# Patient Record
Sex: Female | Born: 1935
Health system: Southern US, Community
[De-identification: ages and names within clinical notes are randomized; demographics above are authoritative.]

## PROBLEM LIST (undated history)

## (undated) DIAGNOSIS — I5032 Chronic diastolic (congestive) heart failure: Secondary | ICD-10-CM

## (undated) DIAGNOSIS — N200 Calculus of kidney: Secondary | ICD-10-CM

## (undated) DIAGNOSIS — F419 Anxiety disorder, unspecified: Secondary | ICD-10-CM

## (undated) DIAGNOSIS — M1990 Unspecified osteoarthritis, unspecified site: Secondary | ICD-10-CM

## (undated) DIAGNOSIS — I119 Hypertensive heart disease without heart failure: Secondary | ICD-10-CM

## (undated) DIAGNOSIS — I10 Essential (primary) hypertension: Secondary | ICD-10-CM

## (undated) DIAGNOSIS — I5189 Other ill-defined heart diseases: Secondary | ICD-10-CM

## (undated) DIAGNOSIS — E039 Hypothyroidism, unspecified: Secondary | ICD-10-CM

## (undated) DIAGNOSIS — E785 Hyperlipidemia, unspecified: Secondary | ICD-10-CM

## (undated) DIAGNOSIS — R001 Bradycardia, unspecified: Secondary | ICD-10-CM

## (undated) DIAGNOSIS — R000 Tachycardia, unspecified: Secondary | ICD-10-CM

## (undated) DIAGNOSIS — I493 Ventricular premature depolarization: Secondary | ICD-10-CM

## (undated) DIAGNOSIS — I350 Nonrheumatic aortic (valve) stenosis: Secondary | ICD-10-CM

## (undated) DIAGNOSIS — I340 Nonrheumatic mitral (valve) insufficiency: Secondary | ICD-10-CM

## (undated) HISTORY — DX: Hyperlipidemia, unspecified: E78.5

## (undated) HISTORY — PX: EYE SURGERY: SHX253

## (undated) HISTORY — PX: EXTRACORPOREAL SHOCK WAVE LITHOTRIPSY: SHX1557

## (undated) HISTORY — DX: Unspecified osteoarthritis, unspecified site: M19.90

## (undated) HISTORY — PX: TONSILLECTOMY: SHX5217

## (undated) HISTORY — DX: Ventricular premature depolarization: I49.3

## (undated) HISTORY — DX: Chronic diastolic (congestive) heart failure: I50.32

## (undated) HISTORY — DX: Tachycardia, unspecified: R00.0

## (undated) HISTORY — DX: Hypothyroidism, unspecified: E03.9

## (undated) HISTORY — DX: Essential (primary) hypertension: I10

## (undated) HISTORY — DX: Other ill-defined heart diseases: I51.89

## (undated) HISTORY — PX: CHOLECYSTECTOMY: SHX55

## (undated) HISTORY — PX: TOTAL ABDOMINAL HYSTERECTOMY: SHX209

## (undated) HISTORY — DX: Bradycardia, unspecified: R00.1

## (undated) HISTORY — DX: Hypertensive heart disease without heart failure: I11.9

## (undated) HISTORY — PX: APPENDECTOMY: SHX54

## (undated) HISTORY — DX: Nonrheumatic mitral (valve) insufficiency: I34.0

---

## 1997-06-23 ENCOUNTER — Other Ambulatory Visit: Admission: RE | Admit: 1997-06-23 | Discharge: 1997-06-23 | Payer: Self-pay | Admitting: *Deleted

## 1998-09-19 ENCOUNTER — Other Ambulatory Visit: Admission: RE | Admit: 1998-09-19 | Discharge: 1998-09-19 | Payer: Self-pay | Admitting: *Deleted

## 1999-10-14 ENCOUNTER — Other Ambulatory Visit: Admission: RE | Admit: 1999-10-14 | Discharge: 1999-10-14 | Payer: Self-pay | Admitting: *Deleted

## 2000-05-13 ENCOUNTER — Encounter: Payer: Self-pay | Admitting: General Surgery

## 2000-05-14 ENCOUNTER — Observation Stay (HOSPITAL_COMMUNITY): Admission: RE | Admit: 2000-05-14 | Discharge: 2000-05-15 | Payer: Self-pay | Admitting: General Surgery

## 2000-05-14 ENCOUNTER — Encounter (INDEPENDENT_AMBULATORY_CARE_PROVIDER_SITE_OTHER): Payer: Self-pay | Admitting: Specialist

## 2000-06-23 ENCOUNTER — Encounter: Payer: Self-pay | Admitting: Emergency Medicine

## 2000-06-23 ENCOUNTER — Emergency Department (HOSPITAL_COMMUNITY): Admission: EM | Admit: 2000-06-23 | Discharge: 2000-06-23 | Payer: Self-pay | Admitting: Emergency Medicine

## 2000-09-10 ENCOUNTER — Inpatient Hospital Stay (HOSPITAL_COMMUNITY): Admission: AD | Admit: 2000-09-10 | Discharge: 2000-09-11 | Payer: Self-pay | Admitting: Internal Medicine

## 2000-09-10 ENCOUNTER — Encounter: Payer: Self-pay | Admitting: Internal Medicine

## 2000-09-11 ENCOUNTER — Encounter: Payer: Self-pay | Admitting: Internal Medicine

## 2001-12-13 ENCOUNTER — Other Ambulatory Visit: Admission: RE | Admit: 2001-12-13 | Discharge: 2001-12-13 | Payer: Self-pay | Admitting: Obstetrics and Gynecology

## 2002-02-04 ENCOUNTER — Encounter: Payer: Self-pay | Admitting: Ophthalmology

## 2002-02-08 ENCOUNTER — Ambulatory Visit (HOSPITAL_COMMUNITY): Admission: RE | Admit: 2002-02-08 | Discharge: 2002-02-08 | Payer: Self-pay | Admitting: Ophthalmology

## 2002-06-01 ENCOUNTER — Emergency Department (HOSPITAL_COMMUNITY): Admission: EM | Admit: 2002-06-01 | Discharge: 2002-06-01 | Payer: Self-pay | Admitting: Emergency Medicine

## 2004-01-31 ENCOUNTER — Ambulatory Visit (HOSPITAL_COMMUNITY): Admission: RE | Admit: 2004-01-31 | Discharge: 2004-01-31 | Payer: Self-pay | Admitting: *Deleted

## 2007-10-18 ENCOUNTER — Ambulatory Visit (HOSPITAL_COMMUNITY): Admission: RE | Admit: 2007-10-18 | Discharge: 2007-10-18 | Payer: Self-pay | Admitting: Urology

## 2008-05-17 ENCOUNTER — Encounter: Admission: RE | Admit: 2008-05-17 | Discharge: 2008-08-15 | Payer: Self-pay | Admitting: Family Medicine

## 2009-06-05 ENCOUNTER — Ambulatory Visit: Payer: Self-pay | Admitting: Internal Medicine

## 2009-06-05 DIAGNOSIS — R0609 Other forms of dyspnea: Secondary | ICD-10-CM

## 2009-06-05 DIAGNOSIS — R0989 Other specified symptoms and signs involving the circulatory and respiratory systems: Secondary | ICD-10-CM

## 2009-06-06 ENCOUNTER — Ambulatory Visit: Payer: Self-pay | Admitting: Internal Medicine

## 2009-06-08 ENCOUNTER — Telehealth (INDEPENDENT_AMBULATORY_CARE_PROVIDER_SITE_OTHER): Payer: Self-pay | Admitting: *Deleted

## 2009-06-14 ENCOUNTER — Telehealth (INDEPENDENT_AMBULATORY_CARE_PROVIDER_SITE_OTHER): Payer: Self-pay | Admitting: *Deleted

## 2009-06-18 ENCOUNTER — Ambulatory Visit (HOSPITAL_COMMUNITY): Admission: RE | Admit: 2009-06-18 | Discharge: 2009-06-18 | Payer: Self-pay | Admitting: Internal Medicine

## 2009-06-20 ENCOUNTER — Telehealth (INDEPENDENT_AMBULATORY_CARE_PROVIDER_SITE_OTHER): Payer: Self-pay | Admitting: *Deleted

## 2009-07-09 ENCOUNTER — Ambulatory Visit: Payer: Self-pay | Admitting: Internal Medicine

## 2009-09-03 ENCOUNTER — Ambulatory Visit: Payer: Self-pay | Admitting: Cardiovascular Disease

## 2009-10-01 ENCOUNTER — Ambulatory Visit: Payer: Self-pay | Admitting: Cardiovascular Disease

## 2009-10-01 ENCOUNTER — Inpatient Hospital Stay (HOSPITAL_COMMUNITY): Admission: EM | Admit: 2009-10-01 | Discharge: 2009-10-03 | Payer: Self-pay | Admitting: Emergency Medicine

## 2009-10-02 ENCOUNTER — Encounter (INDEPENDENT_AMBULATORY_CARE_PROVIDER_SITE_OTHER): Payer: Self-pay | Admitting: Internal Medicine

## 2009-10-19 ENCOUNTER — Ambulatory Visit: Payer: Self-pay | Admitting: Cardiovascular Disease

## 2009-11-07 ENCOUNTER — Ambulatory Visit: Payer: Self-pay | Admitting: Internal Medicine

## 2009-11-12 DIAGNOSIS — R930 Abnormal findings on diagnostic imaging of skull and head, not elsewhere classified: Secondary | ICD-10-CM

## 2009-11-18 ENCOUNTER — Inpatient Hospital Stay (HOSPITAL_COMMUNITY): Admission: EM | Admit: 2009-11-18 | Discharge: 2009-11-21 | Payer: Self-pay | Admitting: Emergency Medicine

## 2009-11-28 ENCOUNTER — Telehealth: Payer: Self-pay | Admitting: Internal Medicine

## 2009-12-05 ENCOUNTER — Ambulatory Visit: Payer: Self-pay | Admitting: Cardiovascular Disease

## 2010-01-02 ENCOUNTER — Ambulatory Visit: Payer: Self-pay | Admitting: Cardiovascular Disease

## 2010-02-14 NOTE — Miscellaneous (Signed)
Summary: Orders Update pft charges  Clinical Lists Changes  Orders: Added new Service order of Carbon Monoxide diffusing w/capacity (94720) - Signed Added new Service order of Lung Volumes (94240) - Signed Added new Service order of Spirometry (Pre & Post) (94060) - Signed 

## 2010-02-14 NOTE — Progress Notes (Signed)
Summary: F/u abnl D-dimer, order CT  Phone Note Outgoing Call   Summary of Call: I called patient to confirm persistent dyspnea of relatively recent onset. She indicates no change- no worse but not better. D-dimer modestly elevated. She agreed to look for PE by getting contrast CT. Marland KitchenShe can't do it before next week and asks we call then to schedule because of poor phone reception where she is now. Home # ofr Cell [phone]. Initial call taken by: Waymon Budge MD,  Jun 08, 2009 1:34 PM  Follow-up for Phone Call        this has been scheduled for 06/13/09 pt will be called on tues 06/12/09 and given this date  Follow-up by: Oneita Jolly,  Jun 08, 2009 5:05 PM

## 2010-02-14 NOTE — Assessment & Plan Note (Signed)
Summary: SIX MIN WALK- PULM STRESS TEST  Nurse Visit   Vital Signs:  Patient profile:   77 year old female Pulse rate:   87 / minute BP sitting:   116 / 68  Medications Prior to Update: 1)  Boniva 150 Mg Tabs (Ibandronate Sodium) .... Once Daily 2)  Diltiazem Hcl Er Beads 300 Mg Xr24h-Cap (Diltiazem Hcl Er Beads) .... Once Daily 3)  Simvastatin 40 Mg Tabs (Simvastatin) .... Once Daily 4)  Guanfacine Hcl 2 Mg Tabs (Guanfacine Hcl) .... Once Daily 5)  Lexapro 20 Mg Tabs (Escitalopram Oxalate) .... Once Daily 6)  Vitamin D 1000 Unit Tabs (Cholecalciferol) .... Once Daily 7)  Aspir-Low 81 Mg Tbec (Aspirin) .... Once Daily 8)  Senior Vites  Cr-Tabs (Multiple Vitamins-Minerals) .... Once Daily 9)  Tylenol 325 Mg Tabs (Acetaminophen) .... Once Weekly  Allergies: No Known Drug Allergies  Orders Added: 1)  Pulmonary Stress (6 min walk) [94620]   Six Minute Walk Test Medications taken before test(dose and time): 1)  Boniva 150 Mg Tabs (Ibandronate Sodium) .... Once Daily 2)  Diltiazem Hcl Er Beads 300 Mg Xr24h-Cap (Diltiazem Hcl Er Beads) .... Once Daily 5)  Lexapro 20 Mg Tabs (Escitalopram Oxalate) .... Once Daily 6)  Vitamin D 1000 Unit Tabs (Cholecalciferol) .... Once Daily 7)  Aspir-Low 81 Mg Tbec (Aspirin) .... Once Daily 8)  Senior Vites  Cr-Tabs (Multiple Vitamins-Minerals) .... Once Daily 9)  Tylenol 325 Mg Tabs (Acetaminophen) .... Once Weekly Pt. TOOK THESE MEDS AT 8:30 AM TODAY Supplemental oxygen during the test: No  Lap counter(place a tick mark inside a square for each lap completed) lap 1 complete  lap 2 complete   lap 3 complete   lap 4 complete  lap 5 complete  lap 6 complete  lap 7 complete    Baseline  BP sitting: 116/ 68 Heart rate: 87 Dyspnea ( Borg scale) 3 Fatigue (Borg scale) 3 SPO2 96  End Of Test  BP sitting: 120/ 72 Heart rate: 103 Dyspnea ( Borg scale) 4 Fatigue (Borg scale) 5 SPO2 96  2 Minutes post  BP sitting: 118/ 68 Heart rate:  84 SPO2 98  Stopped or paused before six minutes? No  Interpretation: Number of laps  7 X 48 meters =   336 meters+ final partial lap: 27 meters =    363 meters   Total distance walked in six minutes: 363 meters  Tech ID: Tivis Ringer, CNA (Jun 06, 2009 2:20 PM) Tech Comments Pt. completed test w/ 0 rest breaks and 0 complaints.

## 2010-02-14 NOTE — Progress Notes (Signed)
Summary: results   Phone Note Call from Patient Call back at Home Phone [phone]   Caller: Patient Call For: young Reason for Call: Talk to Nurse Summary of Call: returning call to [facility] re: ct results. Initial call taken by: Eugene Gavia,  June 20, 2009 8:55 AM  Follow-up for Phone Call        Spoke with pt; aware of results of CT; states that she is still having SOB with activity and "gives out easy". Pt aware that I am forwarding this note to CDY for review and will call her with any changes to his rec's. Pt also aware that CDY is not in the office today.Reynaldo Minium CMA  June 20, 2009 9:16 AM   Additional Follow-up for Phone Call Additional follow up Details #1::        I don't see something to treat differently. The hot weather may be part of her problem. I will review at next appointment, or see her back earlier if needed. Additional Follow-up by: Waymon Budge MD,  June 21, 2009 8:57 AM    Additional Follow-up for Phone Call Additional follow up Details #2::    LMTCB   Philipp Deputy [facility]  June 21, 2009 12:15 PM   Spoke with pt.  Pt informed of above recs per CY.  She verbalized understanding and will call back if need to schedule ov sooner with CY.  Gweneth Dimitri RN  June 22, 2009 10:00 AM

## 2010-02-14 NOTE — Assessment & Plan Note (Signed)
Summary: rov ///kp   Primary Herbert Aguinaldo/Referring Kennedy Bohanon:  Camillo Flaming  CC:  follow up visit-Dyspnea with activity-not getting any better.[name]  History of Present Illness: July 09, 2009 Dyspnea..........................Marland Kitchenhusband here Nothing has changed. No cough, no variation from her initial description. Labs: BNP- 73.5, CBC -Hgb 12.7, D-dimer- 0.66. CT- patchy ground glass. She denies obvious cold or viral infection in 2-3 years. PE excluded. PFT- mild obstruction in small airways w/ response to dilator. DLCO mildly reduced. 6 MWT- 96%, 96%, 98%, 363 meters.  Nov 09, 2009-  Dyspnea. Nurse-CC: follow up visit-Dyspnea with activity-not getting any better. Put on Bystolic for BP in September- hosp then for BP at [facility].  She still notes the same exertional dyspnea especially with dressing and upper body exertion.  Denies cough or wheeze, chest pain, palpitation. a little ankle swelling occasionaly. Had flu vax. Prednisone trial last time gave an energy burst but didn't help her breathing otherwise.    Preventive Screening-Counseling & Management  Alcohol-Tobacco     Smoking Status: never  Current Medications (verified): 1)  Diltiazem Hcl Er Beads 360 Mg Xr24h-Cap (Diltiazem Hcl Er Beads) .... Take 1 By Mouth Once Daily 2)  Lexapro 20 Mg Tabs (Escitalopram Oxalate) .... Once Daily 3)  Vitamin D 1000 Unit Tabs (Cholecalciferol) .... Once Daily 4)  Aspir-Low 81 Mg Tbec (Aspirin) .... Once Daily 5)  Senior Vites  Cr-Tabs (Multiple Vitamins-Minerals) .... Once Daily 6)  Bystolic 20 Mg Tabs (Nebivolol Hcl) .... Take 1 By Mouth Once Daily 7)  Calcium 500 Mg Chew (Calcium Carbonate) .... Take 1000mg  Daily 8)  Levothyroxine Sodium 25 Mcg Tabs (Levothyroxine Sodium) .... Take 1 By Mouth Once Daily 9)  Pravastatin Sodium 20 Mg Tabs (Pravastatin Sodium) .... Take 1 By Mouth Once Daily 10)  Norvasc 2.5 Mg Tabs (Amlodipine Besylate) .... 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   Review of Systems      See HPI       The patient complains of shortness of breath with activity.  The patient denies shortness of breath at rest, productive cough, non-productive cough, coughing up blood, chest pain, irregular heartbeats, acid heartburn, indigestion, loss of appetite, weight change, abdominal pain, difficulty swallowing, sore throat, tooth/dental problems, headaches, nasal congestion/difficulty breathing through nose, sneezing, itching, ear ache, rash, change in color of mucus, and fever.    Vital Signs:  Patient profile:   77 year old female Height:      64 inches Weight:      155 pounds BMI:     26.70 O2 Sat:      97 % on Room air Pulse rate:   70 /  minute BP sitting:   112 / 56  (left arm) Cuff size:   regular  Vitals Entered By: Reynaldo Minium CMA (November 07, 2009 10:14 AM)  O2 Flow:  Room air CC: follow up visit-Dyspnea with activity-not getting any better.   Physical Exam  Additional Exam:  General:  A/Ox3; pleasant and cooperative, NAD, medium build SKIN: no rash, lesions NODES: no lymphadenopathy HEENT: [place]/AT, EOM- WNL, hard of hearing, Conjuctivae- clear, PERRLA, TM-WNL, Nose- clear, Throat- clear and wnl, Mallampati  II. NECK: Supple w/ fair ROM, JVD-trace, normal carotid impulses w/o bruits Thyroid- normal to palpation, no stridor CHEST: Clear to P&A, normal expansion and airflow HEART: RRR, no m/g/r heard ABDOMEN: Soft and nl; nml bowel sounds; no organomegaly or masses noted NAT:FTDD, nl pulses, trace nonpitting edema, no cyanosis or clubbing. Good capillary fill. [name]  NEURO: Grossly intact to observation      Impression & Recommendations:  Problem # 1:  DYSPNEA ON EXERTION (ICD-786.09)  PFTs and chest exam don't suggest pulmonary  basis for dyspnea. I suspect this may be from her cardiac meds. Because of her mild, small airway reversibility I will let her try a rescue inhaler for occasional use with training.  Her updated medication list for this problem includes:    Bystolic 20 Mg Tabs (Nebivolol hcl) .[name]... Take 1 by mouth once daily    Xopenex Hfa 45 Mcg/act Aero (Levalbuterol tartrate) .[name]... 2 puffs four times a day as needed rescue inhaler  Problem # 2:  CT, CHEST, ABNORMAL (ICD-793.1) Her initial CT chest had shown grownd glass opacities indicating fluid in lung parenchyma, such as edema or inflammation. Exam doesn't show much now to go along with this, other than her exertional dyspnea. At age 60 we are watching conservatively.   Medications Added to Medication List This Visit: 1)  Diltiazem Hcl Er Beads 360 Mg Xr24h-cap (Diltiazem hcl er beads) .... Take 1 by mouth once daily 2)  Norvasc 2.5 Mg Tabs (Amlodipine besylate) .... Take 1 by mouth once daily 3)  Bystolic 20 Mg Tabs (Nebivolol hcl) .... Take 1 by mouth once daily 4)  Calcium 500 Mg Chew (Calcium carbonate) .... Take 1000mg  daily 5)  Levothyroxine Sodium 25 Mcg Tabs (Levothyroxine sodium) .... Take 1 by mouth  once daily 6)  Pravastatin Sodium 20 Mg Tabs (Pravastatin sodium) .... Take 1 by mouth once daily 7)  Xopenex Hfa 45 Mcg/act Aero (Levalbuterol tartrate) .... 2 puffs four times a day as needed rescue inhaler  Other Orders: Est. Patient Level IV (22025)  Patient Instructions: 1)  Please schedule a follow-up appointment as needed. 2)  Try sample Xopenex rescue inhaler 3)   2 puffs, four times a day as needed  4)  CC Dr Zachery Dauer, Dr Elease Hashimoto Prescriptions: Pauline Aus HFA 45 MCG/ACT AERO (LEVALBUTEROL TARTRATE) 2 puffs four times a day as needed rescue inhaler  #1 x 0   Entered and Authorized by:   Waymon Budge MD   Signed by:   Waymon Budge MD on 11/07/2009   Method used:   Samples Given   RxID:   [id number]

## 2010-02-14 NOTE — Progress Notes (Signed)
Summary: sched CT  Phone Note Call from Patient Call back at Home Phone [phone]   Caller: Patient Call For: young Summary of Call: pt requests to speak to libby re: scheduling a CT Initial call taken by: Tivis Ringer, CNA,  June 14, 2009 8:45 AM  Follow-up for Phone Call        pt had to have chest ct [facility] 06/18/09@4 :30pm due to having a hard time geting iv in pt is aware  Follow-up by: Oneita Jolly,  June 14, 2009 9:12 AM

## 2010-02-14 NOTE — Assessment & Plan Note (Signed)
Summary: dyspnea/ok per Katie/apc   Primary Provider/Referring Provider:  Camillo Flaming  CC:  Dysnpea/Consult.  History of Present Illness: Jun 05, 2009- 77 yoF never-smoker, coming with her husband today on kind referral by Dr Camillo Flaming for concern of shortnes of breath. she denies prior cardiopulmonary problems. First began to notice dyspnea sometime after move to [facility] 2 years ago with no sudden event or trigger. Especialy in the past 2-3 months she is having to stop, sit and pace herself to take care of the house and ADL's. She is geting gradually worse with exertion, but comfortable sitting and lying at rest. There is little change from day to day. Denies chest pain, palpitation, cough, wheeze, bleeding, bruising, nodes or fever. Denies hx of anemia. Weight may be down 5 lbs. Has coughed "tiny phlegms" just in past 2 weeks. Ankles sometimes swell a litte by end of day, but she denies leg pain, cramps, tightness, personal or family hx of VTE. When she was a child, grandmother with TB (status unknown) lived with her. Never had PPD. Dr Elease Hashimoto recently saw her for cardiology with echo and suggested pulmonary referral.  Current Medications (verified): 1)  Boniva 150 Mg Tabs (Ibandronate Sodium) .... Once Daily 2)  Diltiazem Hcl Er Beads 300 Mg Xr24h-Cap (Diltiazem Hcl Er Beads) .... Once Daily 3)  Simvastatin 40 Mg Tabs (Simvastatin) .... Once Daily 4)  Guanfacine Hcl 2 Mg Tabs (Guanfacine Hcl) .... Once Daily 5)  Lexapro 20 Mg Tabs (Escitalopram Oxalate) .... Once Daily 6)  Vitamin D 1000 Unit Tabs (Cholecalciferol) .... Once Daily 7)  Aspir-Low 81 Mg Tbec (Aspirin) .... Once Daily 8)  Senior Vites  Cr-Tabs (Multiple Vitamins-Minerals) .... Once Daily 9)  Tylenol 325 Mg Tabs (Acetaminophen) .... Once Weekly  Allergies (verified): No Known Drug Allergies  Past History:  Past Medical History: Dyspnea with exertion 2011 HTN, HYPERLIPERDEMIA Kidney stone  Past Surgical  History: GALLBLADDER 2001 HYSTERECTOMY 1955 Tonsillectomy  Family History: NO PAST FAMILY MEDICAL HX  Social History: LIVES WITH HUSBAND AND IS RETIRED  Review of Systems      See HPI       The patient complains of shortness of breath with activity and coughing up blood.  The patient denies shortness of breath at rest, productive cough, non-productive cough, chest pain, irregular heartbeats, acid heartburn, indigestion, loss of appetite, weight change, abdominal pain, difficulty swallowing, sore throat, tooth/dental problems, headaches, nasal congestion/difficulty breathing through nose, sneezing, itching, ear ache, anxiety, depression, hand/feet swelling, joint stiffness or pain, rash, change in color of mucus, and fever.    Vital Signs:  Patient profile:   77 year old female Height:      64 inches Weight:      153.0 pounds BMI:     26.36 O2 Sat:      93 % on Room air Pulse rate:   74 / minute BP sitting:   116 / 68  (left arm) Cuff size:   regular  Vitals Entered By: Denna Haggard, CMA (Jun 05, 2009 11:10 AM)  O2 Sat at Rest %:  93% O2 Flow:  Room air  Physical Exam  Additional Exam:  General: A/Ox3; pleasant and cooperative, NAD, medium build SKIN: no rash, lesions NODES: no lymphadenopathy HEENT: [place]/AT, EOM- WNL, hard of hearing, Conjuctivae- clear, PERRLA, TM-WNL, Nose- clear, Throat- clear and wnl, M II NECK: Supple w/ fair ROM, JVD- none, normal carotid impulses w/o bruits Thyroid- normal to palpation, no stridor CHEST: Clear to P&A, normal  expansion and airflow HEART: RRR, no m/g/r heard ABDOMEN: Soft and nl; nml bowel sounds; no organomegaly or masses noted JXB:JYNW, nl pulses, trace nonpitting edema, no cyanosis or clubbing. Good capillary fill. Negative Homan's.  NEURO: Grossly intact to observation      Impression & Recommendations:  Problem # 1:  DYSPNEA ON EXERTION (ICD-786.09)  Progressive exertional dyspnea only with exertion and without  obvious associated clues to direct attention. She doesn't look anemic. Pulmonary fibrosis, heart failure and silent PE might also present like this. We will check with Dr Zachery Dauer office on result of CBC drawn today. Meanwhile we wil proceed with CXR and D-dimer, and schedule PFT including a . Consider eval for PE. Note that Dr Elease Hashimoto didn't find a cardiac explanation. Nearly an hour was spent working through differential diagnostic possibilities with this nice lady and her husband.  Medications Added to Medication List This Visit: 1)  Boniva 150 Mg Tabs (Ibandronate sodium) .... Once daily 2)  Diltiazem Hcl Er Beads 300 Mg Xr24h-cap (Diltiazem hcl er beads) .... Once daily 3)  Simvastatin 40 Mg Tabs (Simvastatin) .... Once daily 4)  Guanfacine Hcl 2 Mg Tabs (Guanfacine hcl) .... Once daily 5)  Lexapro 20 Mg Tabs (Escitalopram oxalate) .... Once daily 6)  Vitamin D 1000 Unit Tabs (Cholecalciferol) .... Once daily 7)  Aspir-low 81 Mg Tbec (Aspirin) .... Once daily 8)  Senior Vites Cr-tabs (Multiple vitamins-minerals) .... Once daily 9)  Tylenol 325 Mg Tabs (Acetaminophen) .... Once weekly  Other Orders: Consultation Level IV (29562) T-2 View CXR (71020TC) TLB-BNP (B-Natriuretic Peptide) (83880-BNPR) T-D-Dimer Fibrin Derivatives Quantitive ([id number])  Patient Instructions: 1)  Please schedule a follow-up appointment in 3 weeks. 2)  Schedule PFT and 6 MWT 3)  A chest x-ray has been recommended.  Your imaging study may require preauthorization.  4)  Lab 5)  We will check with Dr Zachery Dauer for result of the labwork she drew today, and we will call you with the reults of our own.

## 2010-02-14 NOTE — Progress Notes (Signed)
Summary: inhaler  Phone Note Call from Patient   Caller: Patient Call For: young Summary of Call: dr young told her to call and check to see if we have samples of inhaler.he didn't say what kind. Initial call taken by: Rickard Patience,  November 28, 2009 9:02 AM  Follow-up for Phone Call        Pt states at last OV Dr. Maple Hudson wanted her to take a sample of xopenex to try but he did not have any  that day so told her to call back in a few weeks to check to see if we have any samples. One sample of xopenex hfa left at front for pt. pt aware. Carron Curie CMA  November 28, 2009 9:32 AM

## 2010-02-14 NOTE — Assessment & Plan Note (Signed)
Summary: rov 3 wks///kp   Primary Provider/Referring Provider:  Camillo Flaming  CC:  Follow up visit-still SOB with activity..  History of Present Illness: History of Present Illness: Jun 05, 2009- 88 yoF never-smoker, coming with her husband today on kind referral by Dr Camillo Flaming for concern of shortnes of breath. she denies prior cardiopulmonary problems. First began to notice dyspnea sometime after move to [facility] 2 years ago with no sudden event or trigger. Especialy in the past 2-3 months she is having to stop, sit and pace herself to take care of the house and ADL's. She is geting gradually worse with exertion, but comfortable sitting and lying at rest. There is little change from day to day. Denies chest pain, palpitation, cough, wheeze, bleeding, bruising, nodes or fever. Denies hx of anemia. Weight may be down 5 lbs. Has coughed "tiny phlegms" just in past 2 weeks. Ankles sometimes swell a litte by end of day, but she denies leg pain, cramps, tightness, personal or family hx of VTE. When she was a child, grandmother with TB (status unknown) lived with her. Never had PPD. Dr Elease Hashimoto recently saw her for cardiology with echo and suggested pulmonary referral.  July 09, 2009 Dyspnea..........................Marland Kitchenhusband here Nothing has changed. No cough, no variation from her initial description. Labs: BNP- 73.5, CBC -Hgb 12.7, D-dimer- 0.66. CT- patchy ground glass. She denies obvious cold or viral infection in 2-3 years. PE excluded. PFT- mild obstruction in small airways w/ response to dilator. DLCO mildly reduced. 6 MWT- 96%, 96%, 98%, 363 meters.    Preventive Screening-Counseling & Management  Alcohol-Tobacco     Smoking Status: never  Current Medications (verified): 1)  Diltiazem Hcl Er Beads 300 Mg Xr24h-Cap (Diltiazem Hcl Er Beads) .... Once Daily 2)  Simvastatin 40 Mg Tabs (Simvastatin) .... Once Daily 3)  Guanfacine Hcl 2 Mg Tabs (Guanfacine Hcl) .... Once Daily 4)   Lexapro 20 Mg Tabs (Escitalopram Oxalate) .... Once Daily 5)  Vitamin D 1000 Unit Tabs (Cholecalciferol) .... Once Daily 6)  Aspir-Low 81 Mg Tbec (Aspirin) .... Once Daily 7)  Senior Vites  Cr-Tabs (Multiple Vitamins-Minerals) .... Once Daily 8)  Tylenol 325 Mg Tabs (Acetaminophen) .... Once Weekly  Allergies (verified): No Known Drug Allergies  Past History:  Past Medical History: Last updated: 06/05/2009 Dyspnea with exertion 2011 HTN, HYPERLIPERDEMIA Kidney stone  Past Surgical History: Last updated: 06/05/2009 GALLBLADDER 2001 HYSTERECTOMY 1955 Tonsillectomy  Family History: Last updated: 06/05/2009 NO PAST FAMILY MEDICAL HX  Social History: Last updated: 06/05/2009 LIVES WITH HUSBAND AND IS RETIRED  Risk Factors: Smoking Status: never (07/09/2009)  Social History: Smoking Status:  never  Review of Systems      See HPI       The patient complains of shortness of breath with activity.  The patient denies shortness of breath at rest, productive cough, non-productive cough, coughing up blood, chest pain, irregular heartbeats, acid heartburn, indigestion, loss of appetite, weight change, abdominal pain, difficulty swallowing, sore throat, tooth/dental problems, headaches, nasal congestion/difficulty breathing through nose, sneezing, itching, ear ache, hand/feet swelling, change in color of mucus, and fever.    Vital Signs:  Patient profile:   77 year old female Height:      64 inches Weight:      155 pounds BMI:     26.70 O2 Sat:      97 % on Room air Pulse rate:   95 / minute BP sitting:   130 / 70  (left arm) Cuff size:  regular  Vitals Entered By: Reynaldo Minium CMA (July 09, 2009 11:31 AM)  O2 Flow:  Room air CC: Follow up visit-still SOB with activity.   Physical Exam  Additional Exam:  General: A/Ox3; pleasant and cooperative, NAD, medium build SKIN: no rash, lesions NODES: no lymphadenopathy HEENT: [place]/AT, EOM- WNL, hard of hearing, Conjuctivae-  clear, PERRLA, TM-WNL, Nose- clear, Throat- clear and wnl, Mellampati  II. somehat stridorous vocal drag with inhalation NECK: Supple w/ fair ROM, JVD- none, normal carotid impulses w/o bruits Thyroid- normal to palpation, no stridor CHEST: Clear to P&A, normal expansion and airflow HEART: RRR, no m/g/r heard ABDOMEN: Soft and nl; nml bowel sounds; no organomegaly or masses noted ZOX:WRUE, nl pulses, trace nonpitting edema, no cyanosis or clubbing. Good capillary fill. Negative Homan's.  NEURO: Grossly intact to observation      Impression & Recommendations:  Problem # 1:  DYSPNEA ON EXERTION (ICD-786.09)  CT showed ground glass suggesting a bronchiolitis. PFT showed reversible small airway obstruction with reduced DLCO. was good, Slight drag with inspiration but no stridor. She is especially aware of dyspnea after shower and while waliking hills and stairs. We will try a systemic cortisone burst first.I don't know that objective findings are enough to explain her complaint. Consider deconditioning and query any cardiac limitation. Will share results with her cardiologist.  Medications Added to Medication List This Visit: 1)  Prednisone 10 Mg Tabs (Prednisone) .[name].. 1 tab four times daily x 2 days, 3 times daily x 2 days, 2 times daily x 2 days, 1 time daily x 2 days  Other Orders: Est. Patient Level IV (45409)  Patient Instructions: 1)  Please schedule a follow-up appointment in 4 months. 2)  script sent for prednisone taper 3)  Copy sent to:Dr [name] Prescriptions: PREDNISONE 10 MG TABS (PREDNISONE) 1 tab four times daily x 2 days, 3 times daily x 2 days, 2 times daily x 2 days, 1 time daily x 2 days  #20 x 0   Entered and Authorized by:   Waymon Budge MD   Signed by:   Waymon Budge MD on 07/09/2009   Method used:   Electronically to        [id number] (retail)       [address]       [place], [place]  [address]       Ph: [phone] or [phone]        Fax: [phone]   RxID:   [id number]

## 2010-03-26 LAB — COMPREHENSIVE METABOLIC PANEL
Albumin: 3.5 g/dL (ref 3.5–5.2)
BUN: 14 mg/dL (ref 6–23)
CO2: 25 mEq/L (ref 19–32)
Calcium: 9.2 mg/dL (ref 8.4–10.5)
Chloride: 98 mEq/L (ref 96–112)
Creatinine, Ser: 1.23 mg/dL — ABNORMAL HIGH (ref 0.4–1.2)
GFR calc Af Amer: 50 mL/min — ABNORMAL LOW (ref >60)
GFR calc non Af Amer: 41 mL/min — ABNORMAL LOW (ref >60)
Total Bilirubin: 0.4 mg/dL (ref 0.3–1.2)

## 2010-03-26 LAB — CBC
Hemoglobin: 11.2 g/dL — ABNORMAL LOW (ref 12.0–15.0)
MCH: 31.4 pg (ref 26.0–34.0)
MCH: 31.8 pg (ref 26.0–34.0)
MCHC: 32.6 g/dL (ref 30.0–36.0)
MCV: 96.3 fL (ref 78.0–100.0)
Platelets: 334 10*3/uL (ref 150–400)
RBC: 3.52 MIL/uL — ABNORMAL LOW (ref 3.87–5.11)
RBC: 3.82 MIL/uL — ABNORMAL LOW (ref 3.87–5.11)

## 2010-03-26 LAB — URINE CULTURE
Colony Count: NO GROWTH
Culture  Setup Time: 201111062123
Culture: NO GROWTH

## 2010-03-26 LAB — BASIC METABOLIC PANEL
CO2: 26 mEq/L (ref 19–32)
Calcium: 8.9 mg/dL (ref 8.4–10.5)
GFR calc Af Amer: >60 mL/min (ref >60)
GFR calc non Af Amer: >60 mL/min (ref >60)
Sodium: 143 mEq/L (ref 135–145)

## 2010-03-26 LAB — URINALYSIS, ROUTINE W REFLEX MICROSCOPIC
Ketones, ur: NEGATIVE mg/dL
Nitrite: NEGATIVE
Protein, ur: NEGATIVE mg/dL
Urobilinogen, UA: 0.2 mg/dL (ref 0.0–1.0)

## 2010-03-26 LAB — DIFFERENTIAL
Basophils Absolute: 0 10*3/uL (ref 0.0–0.1)
Eosinophils Absolute: 0.2 10*3/uL (ref 0.0–0.7)
Eosinophils Relative: 3 % (ref 0–5)
Lymphocytes Relative: 42 % (ref 12–46)
Lymphs Abs: 3.4 10*3/uL (ref 0.7–4.0)
Lymphs Abs: 3.7 10*3/uL (ref 0.7–4.0)
Monocytes Absolute: 0.5 10*3/uL (ref 0.1–1.0)
Monocytes Relative: 7 % (ref 3–12)
Neutro Abs: 4.4 10*3/uL (ref 1.7–7.7)

## 2010-03-26 LAB — APTT: aPTT: 26 seconds (ref 24–37)

## 2010-03-26 LAB — PROTIME-INR: Prothrombin Time: 12.4 seconds (ref 11.6–15.2)

## 2010-03-26 LAB — CARDIAC PANEL(CRET KIN+CKTOT+MB+TROPI): Relative Index: INVALID (ref 0.0–2.5)

## 2010-03-26 LAB — URINE MICROSCOPIC-ADD ON

## 2010-03-26 LAB — POCT CARDIAC MARKERS
CKMB, poc: <1 ng/mL — ABNORMAL LOW (ref 1.0–8.0)
Troponin i, poc: <0.05 ng/mL (ref 0.00–0.09)

## 2010-03-26 LAB — TSH: TSH: 6.075 u[IU]/mL — ABNORMAL HIGH (ref 0.350–4.500)

## 2010-03-26 LAB — CK TOTAL AND CKMB (NOT AT ARMC)
CK, MB: 0.6 ng/mL (ref 0.3–4.0)
Relative Index: INVALID (ref 0.0–2.5)

## 2010-03-28 LAB — CBC
MCH: 33 pg (ref 26.0–34.0)
MCV: 95.2 fL (ref 78.0–100.0)
MCV: 95.3 fL (ref 78.0–100.0)
Platelets: 282 10*3/uL (ref 150–400)
Platelets: 310 10*3/uL (ref 150–400)
RBC: 3.91 MIL/uL (ref 3.87–5.11)
RDW: 13.6 % (ref 11.5–15.5)
WBC: 11.6 10*3/uL — ABNORMAL HIGH (ref 4.0–10.5)
WBC: 12 10*3/uL — ABNORMAL HIGH (ref 4.0–10.5)

## 2010-03-28 LAB — BASIC METABOLIC PANEL
BUN: 10 mg/dL (ref 6–23)
CO2: 23 mEq/L (ref 19–32)
CO2: 25 mEq/L (ref 19–32)
Calcium: 9.3 mg/dL (ref 8.4–10.5)
Chloride: 105 mEq/L (ref 96–112)
Chloride: 106 mEq/L (ref 96–112)
Chloride: 107 mEq/L (ref 96–112)
Creatinine, Ser: 0.52 mg/dL (ref 0.4–1.2)
Creatinine, Ser: 0.62 mg/dL (ref 0.4–1.2)
Creatinine, Ser: 0.92 mg/dL (ref 0.4–1.2)
GFR calc Af Amer: >60 mL/min (ref >60)
GFR calc Af Amer: >60 mL/min (ref >60)
Glucose, Bld: 119 mg/dL — ABNORMAL HIGH (ref 70–99)

## 2010-03-28 LAB — TSH: TSH: 3.213 u[IU]/mL (ref 0.350–4.500)

## 2010-03-28 LAB — CARDIAC PANEL(CRET KIN+CKTOT+MB+TROPI)
CK, MB: 1.1 ng/mL (ref 0.3–4.0)
CK, MB: 1.3 ng/mL (ref 0.3–4.0)
Relative Index: INVALID (ref 0.0–2.5)
Relative Index: INVALID (ref 0.0–2.5)
Total CK: 35 U/L (ref 7–177)
Troponin I: 0.01 ng/mL (ref 0.00–0.06)

## 2010-03-28 LAB — T3, FREE: T3, Free: 2.3 pg/mL (ref 2.3–4.2)

## 2010-03-28 LAB — LIPID PANEL
Cholesterol: 179 mg/dL (ref 0–200)
LDL Cholesterol: 109 mg/dL — ABNORMAL HIGH (ref 0–99)
Triglycerides: 63 mg/dL (ref <150)

## 2010-03-28 LAB — MAGNESIUM
Magnesium: 2.1 mg/dL (ref 1.5–2.5)
Magnesium: 2.2 mg/dL (ref 1.5–2.5)

## 2010-03-28 LAB — DIFFERENTIAL
Basophils Absolute: 0 10*3/uL (ref 0.0–0.1)
Eosinophils Absolute: 0 10*3/uL (ref 0.0–0.7)
Eosinophils Relative: 0 % (ref 0–5)
Eosinophils Relative: 1 % (ref 0–5)
Lymphocytes Relative: 20 % (ref 12–46)
Lymphs Abs: 2.6 10*3/uL (ref 0.7–4.0)
Monocytes Relative: 5 % (ref 3–12)
Neutrophils Relative %: 71 % (ref 43–77)
Neutrophils Relative %: 74 % (ref 43–77)

## 2010-03-28 LAB — COMPREHENSIVE METABOLIC PANEL
ALT: 38 U/L — ABNORMAL HIGH (ref 0–35)
AST: 38 U/L — ABNORMAL HIGH (ref 0–37)
Albumin: 3.6 g/dL (ref 3.5–5.2)
Calcium: 8.6 mg/dL (ref 8.4–10.5)
GFR calc Af Amer: >60 mL/min (ref >60)
Sodium: 139 mEq/L (ref 135–145)
Total Protein: 7.7 g/dL (ref 6.0–8.3)

## 2010-03-28 LAB — RAPID URINE DRUG SCREEN, HOSP PERFORMED
Barbiturates: NOT DETECTED
Cocaine: NOT DETECTED

## 2010-03-28 LAB — URINE CULTURE
Colony Count: NO GROWTH
Culture  Setup Time: 201109192150

## 2010-03-28 LAB — URINE MICROSCOPIC-ADD ON

## 2010-03-28 LAB — URINALYSIS, ROUTINE W REFLEX MICROSCOPIC
Bilirubin Urine: NEGATIVE
Nitrite: NEGATIVE
Specific Gravity, Urine: 1.018 (ref 1.005–1.030)
Urobilinogen, UA: 0.2 mg/dL (ref 0.0–1.0)
pH: 6 (ref 5.0–8.0)

## 2010-03-28 LAB — PHOSPHORUS
Phosphorus: 2.9 mg/dL (ref 2.3–4.6)
Phosphorus: 3.1 mg/dL (ref 2.3–4.6)

## 2010-03-28 LAB — HEMOGLOBIN A1C: Mean Plasma Glucose: 126 mg/dL — ABNORMAL HIGH (ref <117)

## 2010-03-28 LAB — POCT CARDIAC MARKERS

## 2010-05-31 NOTE — H&P (Signed)
   NAMETAMANIKA, HEINEY                             ACCOUNT NO.:  [id number]   MEDICAL RECORD NO.:  [id number]                   PATIENT TYPE:  OIB   LOCATION:  2857                                 FACILITY:  [facility]   PHYSICIAN:  Guadelupe Sabin, M.D.             DATE OF BIRTH:  09-16-1921   DATE OF ADMISSION:  02/08/2002  DATE OF DISCHARGE:                                HISTORY & PHYSICAL   CHIEF COMPLAINT:  This was a planned outpatient surgical admission of this  77 year old white female admitted for cataract implant surgery of the right  eye.   HISTORY OF PRESENT ILLNESS:  This patient has been followed in my office for  routine eye care since August 18, 1973.  Initial examination at that time  revealed a corrected acuity of 20/20 in each eye.  Presbyopia was noted, but  the eye itself appeared normal.  Over ensuing years, the patient was seen in  08/01/97 at which time it was noted that the patient had developed cataract  formation and pseudoexfoliation.  Applanation tonometry 14 mm right eye, 16  left eye.  The patient was felt to be a glaucoma suspect in view of the  pseudoexfoliation syndrome.  The patient continued to have a normal  intraocular pressure and progression of her cataracts.  By December 2003,  vision had deteriorated to 20/80 with best correction and it was elected to  proceed with cataract implant surgery.  The patient was given oral  discussion and printed information concerning the procedure and its possible  complications.  She signed an informed consent and arrangements were made  for her outpatient admission at this time.  The patient stated that she had  claustrophobia and requested general anesthesia rather than local  anesthesia.                                               Guadelupe Sabin, M.D.    HNJ/MEDQ  D:  02/08/2002  T:  02/08/2002  Job:  [id number]

## 2010-05-31 NOTE — Op Note (Signed)
NAMELESHAWN, HOUSEWORTH                             ACCOUNT NO.:  [id number]   MEDICAL RECORD NO.:  [id number]                   PATIENT TYPE:  OIB   LOCATION:  2857                                 FACILITY:  [facility]   PHYSICIAN:  Guadelupe Sabin, M.D.             DATE OF BIRTH:  May 30, 1921   DATE OF PROCEDURE:  02/08/2002  DATE OF DISCHARGE:                                 OPERATIVE REPORT   PREOPERATIVE DIAGNOSES:  1. Senile nuclear cataract, right eye.  2. Pseudoexfoliation of lens capsule, right eye.   POSTOPERATIVE DIAGNOSES:  1. Senile nuclear cataract, right eye.  2. Pseudoexfoliation of lens capsule, right eye.   NAME OF OPERATION:  Planned extracapsular cataract extraction -  phacoemulsification, primary insertion approach to chamber intraocular lens  implant.   SURGEON:  Guadelupe Sabin, M.D.   ASSISTANT:  Nurse.   ANESTHESIA:  General, at patient's request due to claustrophobia.  The  patient also received 2 mL of retrobulbar injection consisting of 4%  Xylocaine and 0.75 Marcaine and Wydase.  Anesthesia standby, of course, was  required in the general anesthesia.  Dr. Gypsy Balsam was the attending  anesthesiologist.   OPERATIVE PROCEDURE:  After the patient was prepped and draped, a lid  speculum was inserted in the right eye.  She has tonometry that was recorded  at 8 scale units with a 5.5 g weight.  A superior rectus traction suture was  placed.  A peritomy was performed at the limbus from the 11 and 1 o'clock  position.  The corneoscleral junction was cleaned and corneal scleral groove  made with a 45-degree super blade.  The anterior chamber was then entered  with the 2.5 mm diamond keratome at the 12 o'clock position and the 15  degree blade at the 2:30 position.  Using a bent #26-gauge needle on a  Healon syringe, a circular capsulorrhexis was begun and then completed with  the Grabow forceps.  Hydrodissection and hydrodelineation were performed  using 1%  Xylocaine.  The lens nucleus could be rotated in the capsular bag.  A 30-degree phacoemulsification tip was then inserted with slow controlled  emulsification of the lens nucleus.  Total ultrasonic time 2 minutes 22  seconds.  Average power level 40%.  Total amount of fluid used 100 mL.  Following removal of the nucleus, the residual cortex was aspirated with the  irrigation-aspiration tip.  The posterior capsule appeared intact and there  did not appear to be an zonular dehiscence.  It was therefore elected to  insert and an [employer] SI 40 and these three-piece silicone  posterior chamber intraocular lens implant, diopter strength  +19.00.  This was inserted with the McDonald forceps into the anterior  chamber and then centered into the capsular bag using the [facility] lens  rotator.  The lens appeared to be well centered.  The Healon which had been  used throughout the procedure was aspirated and replaced with balanced salt  solution and Myocol ophthalmic solution.  The operative incisions appeared  to be self sealing, however, it was felt due to the general anesthesia that  one single 10-0 interrupted suture would secure the 12 o'clock incision.  Maxitrol ointment was instilled in the conjunctival cul-  de-sac and a light patch and protective shield applied.  The duration of the  procedure and anesthesia administration was 45 minutes.  The patient  tolerated the procedure well in general and left the operating room for the  recovery room in good condition.                                                    Guadelupe Sabin, M.D.    HNJ/MEDQ  D:  02/08/2002  T:  02/08/2002  Job:  [id number]

## 2010-06-28 ENCOUNTER — Encounter: Payer: Self-pay | Admitting: *Deleted

## 2010-07-05 ENCOUNTER — Ambulatory Visit (INDEPENDENT_AMBULATORY_CARE_PROVIDER_SITE_OTHER): Payer: Medicare Other | Admitting: Cardiovascular Disease

## 2010-07-05 ENCOUNTER — Encounter: Payer: Self-pay | Admitting: Cardiovascular Disease

## 2010-07-05 VITALS — BP 128/68 | HR 78 | Wt 156.0 lb

## 2010-07-05 DIAGNOSIS — I5189 Other ill-defined heart diseases: Secondary | ICD-10-CM

## 2010-07-05 DIAGNOSIS — I519 Heart disease, unspecified: Secondary | ICD-10-CM

## 2010-07-05 DIAGNOSIS — I5032 Chronic diastolic (congestive) heart failure: Secondary | ICD-10-CM | POA: Insufficient documentation

## 2010-07-05 NOTE — Assessment & Plan Note (Signed)
[name] presents with a history of diastolic dysfunction. She is doing very well. She is not having any episodes of shortness breath. We'll continue with her same medications.

## 2010-07-05 NOTE — Progress Notes (Signed)
[name] Date of Birth  25-May-1921 [facility] [address]     [address] [place], [place]  [address] [phone]  Fax  [phone]  History of Present Illness:  Pt is doing well.  No cardiac complaints.  Current Outpatient Prescriptions on File Prior to Visit  Medication Sig Dispense Refill  . Acetaminophen (TYLENOL PO) Take 1-2 capsules by mouth as needed.        [name] amLODipine (NORVASC) 2.5 MG tablet Take 2.5 mg by mouth daily.        [name] aspirin 81 MG tablet Take by mouth. 1/4 tablet daily        . Calcium Carb-Cholecalciferol (CALCIUM 1000 + D PO) Take 1,000 mg by mouth daily.        . celecoxib (CELEBREX) 200 MG capsule Take 200 mg by mouth as needed.        . cholecalciferol (VITAMIN D) 1000 UNITS tablet Take 1,000 Units by mouth daily.        [name] levothyroxine (SYNTHROID, LEVOTHROID) 25 MCG tablet Take 25 mcg by mouth daily.        . multivitamin (THERAGRAN) per tablet Take 1 tablet by mouth daily.        . nebivolol (BYSTOLIC) 5 MG tablet Take 5 mg by mouth daily.        . pravastatin (PRAVACHOL) 20 MG tablet Take 20 mg by mouth daily.        [name] DISCONTD: escitalopram (LEXAPRO) 20 MG tablet Take 20 mg by mouth every 3 (three) days.          No Known Allergies  Past Medical History  Diagnosis Date  . Hypertension   . Hyperlipidemia   . Tachycardia     history of tachycardia and bradycardia  . Hypothyroidism   . Dyspnea   . Diastolic dysfunction     mild   . Mild mitral regurgitation by prior echocardiogram   . Bradycardia     hospitalized for   . Arthritis     Past Surgical History  Procedure Date  . Total abdominal hysterectomy   . Cholecystectomy   . Extracorporeal shock wave lithotripsy   . Tonsillectomy     History  Smoking status  . Never Smoker   Smokeless tobacco  . Never Used    History  Alcohol Use No    Family History  Problem Relation Age of Onset  . Emphysema Father   . Heart failure Mother    . Heart attack Brother   . Cancer Brother     kidney  . Stroke Brother     Reviw of Systems:  Reviewed in the HPI.  All other systems are negative.  Physical Exam: BP 128/68  Pulse 78  Wt 156 lb (70.761 kg) The patient is alert and oriented x 3.  The mood and affect are normal.   Skin: warm and dry.  Color is normal.    HEENT:   the sclera are nonicteric.  The mucous membranes are moist.  The carotids are 2+ without bruits.  There is no thyromegaly.  There is no JVD.    Lungs: clear.  The chest wall is non tender.    Heart: regular rate with a normal S1 and S2.  There are no murmurs, gallops, or rubs. The PMI is not displaced.     Abdomin: good bowel sounds.  There is no guarding or rebound.  There is no hepatosplenomegaly or tenderness.  There are no masses.   Extremities:  no clubbing, cyanosis, or edema.  The legs are without rashes.  The distal pulses are intact.   Neuro:  Cranial nerves II - XII are intact.  Motor and sensory functions are intact.    The gait is normal.  ECG:  Assessment / Plan:

## 2010-10-14 LAB — URINALYSIS, ROUTINE W REFLEX MICROSCOPIC
Bilirubin Urine: NEGATIVE
Nitrite: NEGATIVE
Specific Gravity, Urine: 1.022
pH: 6

## 2010-10-14 LAB — BASIC METABOLIC PANEL
BUN: 13
Calcium: 9.1
Creatinine, Ser: 0.85
GFR calc non Af Amer: >60
Glucose, Bld: 102 — ABNORMAL HIGH

## 2010-10-14 LAB — CBC
Platelets: 294
RDW: 13.4

## 2010-10-14 LAB — URINE MICROSCOPIC-ADD ON

## 2010-11-12 ENCOUNTER — Other Ambulatory Visit: Payer: Self-pay | Admitting: Cardiovascular Disease

## 2010-12-20 ENCOUNTER — Ambulatory Visit (INDEPENDENT_AMBULATORY_CARE_PROVIDER_SITE_OTHER): Payer: Medicare Other | Admitting: Cardiovascular Disease

## 2010-12-20 ENCOUNTER — Encounter: Payer: Self-pay | Admitting: Cardiovascular Disease

## 2010-12-20 DIAGNOSIS — I10 Essential (primary) hypertension: Secondary | ICD-10-CM

## 2010-12-20 DIAGNOSIS — I495 Sick sinus syndrome: Secondary | ICD-10-CM

## 2010-12-20 DIAGNOSIS — I519 Heart disease, unspecified: Secondary | ICD-10-CM

## 2010-12-20 DIAGNOSIS — I5189 Other ill-defined heart diseases: Secondary | ICD-10-CM

## 2010-12-20 MED ORDER — AMLODIPINE BESYLATE 5 MG PO TABS: 5.0000 mg | ORAL_TABLET | Freq: Every day | ORAL | Status: DC

## 2010-12-20 NOTE — Patient Instructions (Signed)
Your physician recommends that you schedule a follow-up appointment in: 3 months  Your physician recommends that you return for a FASTING lipid profile: 3 months  Your physician has recommended you make the following change in your medication:   INCREASE: amlodipine to 5 mg daily

## 2010-12-20 NOTE — Assessment & Plan Note (Signed)
Her blood pressure is moderately elevated today. We'll increase her amlodipine to 5 mg a day. I'll see her again in the office in 3 months for a followup visit.

## 2010-12-20 NOTE — Progress Notes (Signed)
STEPHANNE GREELEY Date of Birth  [birth year] [facility] [address]    [address] [place], [place]  [address] [phone]  Fax  [phone]  History of Present Illness:  Mrs. Luse is an 77 year old female with a history of diastolic dysfunction, mild aortic stenosis and hypothyroidism. She also has a history of tachybradycardia syndrome.  She's had a stress Myoview study in May of 2010 which was normal. She has an ejection fraction of 75%. There was no ischemia.  She had an echocardiogram performed in September, 2001 which revealed normal left ventricular systolic function with moderate left ventricular hypertrophy and mild aortic stenosis.  She presents today for followup visit. She does not have any complaints. She denies any chest pain or shortness of breath.  Current Outpatient Prescriptions on File Prior to Visit  Medication Sig Dispense Refill  . Acetaminophen (TYLENOL PO) Take 1-2 capsules by mouth as needed.        [name] amLODipine (NORVASC) 2.5 MG tablet TAKE 1 TABLET BY MOUTH EVERY DAY  30 tablet  6  . aspirin 81 MG tablet Take by mouth. 1/4 tablet daily        . Calcium Carb-Cholecalciferol (CALCIUM 1000 + D PO) Take 1,000 mg by mouth daily.        . celecoxib (CELEBREX) 200 MG capsule Take 200 mg by mouth as needed.        . cholecalciferol (VITAMIN D) 1000 UNITS tablet Take 1,000 Units by mouth daily.        [name] levothyroxine (SYNTHROID, LEVOTHROID) 25 MCG tablet Take 25 mcg by mouth daily.        . multivitamin (THERAGRAN) per tablet Take 1 tablet by mouth daily.        . nebivolol (BYSTOLIC) 5 MG tablet Take 5 mg by mouth daily.        . pravastatin (PRAVACHOL) 20 MG tablet Take 20 mg by mouth daily.          No Known Allergies  Past Medical History  Diagnosis Date  . Hypertension   . Hyperlipidemia   . Tachycardia     history of tachycardia and bradycardia  . Hypothyroidism   . Dyspnea   . Diastolic dysfunction     mild   . Mild mitral regurgitation by  prior echocardiogram   . Bradycardia     hospitalized for   . Arthritis     Past Surgical History  Procedure Date  . Total abdominal hysterectomy   . Cholecystectomy   . Extracorporeal shock wave lithotripsy   . Tonsillectomy     History  Smoking status  . Never Smoker   Smokeless tobacco  . Never Used    History  Alcohol Use No    Family History  Problem Relation Age of Onset  . Emphysema Father   . Heart failure Mother   . Heart attack Brother   . Cancer Brother     kidney  . Stroke Brother     Reviw of Systems:  Reviewed in the HPI.  All other systems are negative.  Physical Exam: BP 155/72  Pulse 77  Ht 5\' 4"  (1.626 m)  Wt 156 lb 6.4 oz (70.943 kg)  BMI 26.85 kg/m2 The patient is alert and oriented x 3.  The mood and affect are normal.   Skin: warm and dry.  Color is normal.    HEENT:   Normocephalic/atraumatic. Her carotids are normal.  Lungs: She has a few wheezes in  the bases.   Heart: Regular rate S1-S2. Chest soft 1-2/6 systolic murmur.    Abdomen: Abdomen is benign.  Extremities:  No c/c/e  Neuro:  Hard of hearing, otherwise nonfocal    ECG: NSR, PVCs  Assessment / Plan:

## 2011-03-25 DIAGNOSIS — H43819 Vitreous degeneration, unspecified eye: Secondary | ICD-10-CM | POA: Diagnosis not present

## 2011-03-25 DIAGNOSIS — H2510 Age-related nuclear cataract, unspecified eye: Secondary | ICD-10-CM | POA: Diagnosis not present

## 2011-03-25 DIAGNOSIS — H3530 Unspecified macular degeneration: Secondary | ICD-10-CM | POA: Diagnosis not present

## 2011-04-01 ENCOUNTER — Ambulatory Visit (INDEPENDENT_AMBULATORY_CARE_PROVIDER_SITE_OTHER): Payer: Medicare Other | Admitting: Cardiovascular Disease

## 2011-04-01 ENCOUNTER — Encounter: Payer: Self-pay | Admitting: Cardiovascular Disease

## 2011-04-01 VITALS — BP 121/70 | HR 87 | Ht 65.0 in | Wt 152.1 lb

## 2011-04-01 DIAGNOSIS — I10 Essential (primary) hypertension: Secondary | ICD-10-CM

## 2011-04-01 NOTE — Progress Notes (Signed)
[name] Date of Birth  1921/02/23 [facility] [address]    [address] [place], [place]  [address] [phone]  Fax  [phone]  Problem list: 1. Hypertension 2. Hyperlipidemia 3. Hypothyroidism  History of Present Illness:  [name] is an 78 year old female with a history of diastolic dysfunction, mild aortic stenosis and hypothyroidism. She also has a history of tachybradycardia syndrome.  She's had a stress Myoview study in May of 2010 which was normal. She has an ejection fraction of 75%. There was no ischemia.  She had an echocardiogram performed in September, 2001 which revealed normal left ventricular systolic function with moderate left ventricular hypertrophy and mild aortic stenosis.  She presents today for followup visit. She does not have any complaints. She denies any chest pain or shortness of breath.     Current Outpatient Prescriptions on File Prior to Visit  Medication Sig Dispense Refill  . Acetaminophen (TYLENOL PO) Take 1-2 capsules by mouth as needed.        [name] amLODipine (NORVASC) 5 MG tablet Take 1 tablet (5 mg total) by mouth daily.  30 tablet  6  . aspirin 81 MG tablet Take by mouth. 1/4 tablet daily        . cholecalciferol (VITAMIN D) 1000 UNITS tablet Take 1,000 Units by mouth daily.        [name] levothyroxine (SYNTHROID, LEVOTHROID) 25 MCG tablet Take 25 mcg by mouth daily.        . multivitamin (THERAGRAN) per tablet Take 1 tablet by mouth daily.        . nebivolol (BYSTOLIC) 5 MG tablet Take 5 mg by mouth daily.        . pravastatin (PRAVACHOL) 20 MG tablet Take 20 mg by mouth daily.          No Known Allergies  Past Medical History  Diagnosis Date  . Hypertension   . Hyperlipidemia   . Tachycardia     history of tachycardia and bradycardia  . Hypothyroidism   . Dyspnea   . Diastolic dysfunction     mild   . Mild mitral regurgitation by prior echocardiogram   . Bradycardia     hospitalized for   . Arthritis     Past  Surgical History  Procedure Date  . Total abdominal hysterectomy   . Cholecystectomy   . Extracorporeal shock wave lithotripsy   . Tonsillectomy     History  Smoking status  . Never Smoker   Smokeless tobacco  . Never Used    History  Alcohol Use No    Family History  Problem Relation Age of Onset  . Emphysema Father   . Heart failure Mother   . Heart attack Brother   . Cancer Brother     kidney  . Stroke Brother     Reviw of Systems:  Reviewed in the HPI.  All other systems are negative.  Physical Exam: BP 121/70  Pulse 87  Ht 5\' 5"  (1.651 m)  Wt 152 lb 1.9 oz (69.001 kg)  BMI 25.31 kg/m2 The patient is alert and oriented x 3.  The mood and affect are normal.   Skin: warm and dry.  Color is normal.    HEENT:   Normocephalic/atraumatic. Her carotids are normal.  Lungs: clear  Heart: Regular rate S1-S2. Chest soft 1-2/6 systolic murmur.    Abdomen: Abdomen is benign.  Extremities:  No c/c/e  Neuro:  Hard of hearing, otherwise nonfocal  ECG:  Assessment / Plan:

## 2011-04-01 NOTE — Assessment & Plan Note (Addendum)
Her blood pressure was initially a little bit elevated. Her blood pressures typically normal in the morning. We'll have her continue with the diet and exercise program. She remains very active.  I'll see her again in one year for followup office visit, fasting labs, and EKG.

## 2011-04-01 NOTE — Patient Instructions (Signed)
Your physician wants you to follow-up in: 1 YEAR OR SOONER IF NEED. You will receive a reminder letter in the mail two months in advance. If you don't receive a letter, please call our office to schedule the follow-up appointment.  Your physician recommends that you return for a FASTING lipid profile: 1 YEAR

## 2011-04-04 ENCOUNTER — Other Ambulatory Visit: Payer: Self-pay | Admitting: *Deleted

## 2011-04-04 MED ORDER — AMLODIPINE BESYLATE 5 MG PO TABS: 5.0000 mg | ORAL_TABLET | Freq: Every day | ORAL | Status: DC

## 2011-04-14 DIAGNOSIS — H6590 Unspecified nonsuppurative otitis media, unspecified ear: Secondary | ICD-10-CM | POA: Diagnosis not present

## 2011-04-14 DIAGNOSIS — S46819A Strain of other muscles, fascia and tendons at shoulder and upper arm level, unspecified arm, initial encounter: Secondary | ICD-10-CM | POA: Diagnosis not present

## 2011-04-14 DIAGNOSIS — J309 Allergic rhinitis, unspecified: Secondary | ICD-10-CM | POA: Diagnosis not present

## 2011-04-29 DIAGNOSIS — L219 Seborrheic dermatitis, unspecified: Secondary | ICD-10-CM | POA: Diagnosis not present

## 2011-04-29 DIAGNOSIS — L738 Other specified follicular disorders: Secondary | ICD-10-CM | POA: Diagnosis not present

## 2011-04-29 DIAGNOSIS — Z85828 Personal history of other malignant neoplasm of skin: Secondary | ICD-10-CM | POA: Diagnosis not present

## 2011-04-29 DIAGNOSIS — L570 Actinic keratosis: Secondary | ICD-10-CM | POA: Diagnosis not present

## 2011-05-06 ENCOUNTER — Encounter: Payer: Self-pay | Admitting: Cardiovascular Disease

## 2011-05-21 DIAGNOSIS — H3530 Unspecified macular degeneration: Secondary | ICD-10-CM | POA: Diagnosis not present

## 2011-05-21 DIAGNOSIS — H2589 Other age-related cataract: Secondary | ICD-10-CM | POA: Diagnosis not present

## 2011-05-21 DIAGNOSIS — H25019 Cortical age-related cataract, unspecified eye: Secondary | ICD-10-CM | POA: Diagnosis not present

## 2011-05-21 DIAGNOSIS — H2510 Age-related nuclear cataract, unspecified eye: Secondary | ICD-10-CM | POA: Diagnosis not present

## 2011-06-16 DIAGNOSIS — H9209 Otalgia, unspecified ear: Secondary | ICD-10-CM | POA: Diagnosis not present

## 2011-07-02 ENCOUNTER — Other Ambulatory Visit: Payer: Self-pay | Admitting: *Deleted

## 2011-07-02 MED ORDER — NEBIVOLOL HCL 5 MG PO TABS: 5.0000 mg | ORAL_TABLET | Freq: Every day | ORAL | Status: DC

## 2011-07-02 NOTE — Telephone Encounter (Signed)
Fax Received. Refill Completed. Yosselin Zoeller Chowoe (R.M.A)   

## 2011-07-04 DIAGNOSIS — L259 Unspecified contact dermatitis, unspecified cause: Secondary | ICD-10-CM | POA: Diagnosis not present

## 2011-07-04 DIAGNOSIS — I10 Essential (primary) hypertension: Secondary | ICD-10-CM | POA: Diagnosis not present

## 2011-07-09 DIAGNOSIS — L538 Other specified erythematous conditions: Secondary | ICD-10-CM | POA: Diagnosis not present

## 2011-07-09 DIAGNOSIS — L821 Other seborrheic keratosis: Secondary | ICD-10-CM | POA: Diagnosis not present

## 2011-07-09 DIAGNOSIS — L570 Actinic keratosis: Secondary | ICD-10-CM | POA: Diagnosis not present

## 2011-07-11 DIAGNOSIS — N200 Calculus of kidney: Secondary | ICD-10-CM | POA: Diagnosis not present

## 2011-07-11 DIAGNOSIS — R82998 Other abnormal findings in urine: Secondary | ICD-10-CM | POA: Diagnosis not present

## 2011-07-14 DIAGNOSIS — Z1231 Encounter for screening mammogram for malignant neoplasm of breast: Secondary | ICD-10-CM | POA: Diagnosis not present

## 2011-07-16 DIAGNOSIS — H9209 Otalgia, unspecified ear: Secondary | ICD-10-CM | POA: Diagnosis not present

## 2011-07-16 DIAGNOSIS — H9110 Presbycusis, unspecified ear: Secondary | ICD-10-CM | POA: Diagnosis not present

## 2011-07-31 DIAGNOSIS — R197 Diarrhea, unspecified: Secondary | ICD-10-CM | POA: Diagnosis not present

## 2011-07-31 DIAGNOSIS — M542 Cervicalgia: Secondary | ICD-10-CM | POA: Diagnosis not present

## 2011-08-01 DIAGNOSIS — R197 Diarrhea, unspecified: Secondary | ICD-10-CM | POA: Diagnosis not present

## 2011-08-11 DIAGNOSIS — R197 Diarrhea, unspecified: Secondary | ICD-10-CM | POA: Diagnosis not present

## 2011-10-20 DIAGNOSIS — Z23 Encounter for immunization: Secondary | ICD-10-CM | POA: Diagnosis not present

## 2011-10-24 ENCOUNTER — Other Ambulatory Visit: Payer: Self-pay | Admitting: *Deleted

## 2011-10-24 MED ORDER — AMLODIPINE BESYLATE 5 MG PO TABS: 5.0000 mg | ORAL_TABLET | Freq: Every day | ORAL | Status: DC

## 2011-10-24 NOTE — Telephone Encounter (Signed)
Fax Received. Refill Completed. [name] (R.M.A)   

## 2011-10-29 ENCOUNTER — Encounter (HOSPITAL_COMMUNITY): Payer: Self-pay

## 2011-10-29 ENCOUNTER — Emergency Department (HOSPITAL_COMMUNITY): Payer: Medicare Other

## 2011-10-29 ENCOUNTER — Observation Stay (HOSPITAL_COMMUNITY)
Admission: EM | Admit: 2011-10-29 | Discharge: 2011-10-30 | Disposition: A | Discharge status: Home / Self Care | Payer: Medicare Other | Attending: Family Medicine | Admitting: Family Medicine

## 2011-10-29 DIAGNOSIS — R079 Chest pain, unspecified: Principal | ICD-10-CM | POA: Insufficient documentation

## 2011-10-29 DIAGNOSIS — E785 Hyperlipidemia, unspecified: Secondary | ICD-10-CM | POA: Diagnosis not present

## 2011-10-29 DIAGNOSIS — I519 Heart disease, unspecified: Secondary | ICD-10-CM | POA: Diagnosis not present

## 2011-10-29 DIAGNOSIS — I5189 Other ill-defined heart diseases: Secondary | ICD-10-CM

## 2011-10-29 DIAGNOSIS — Z7982 Long term (current) use of aspirin: Secondary | ICD-10-CM | POA: Insufficient documentation

## 2011-10-29 DIAGNOSIS — I119 Hypertensive heart disease without heart failure: Secondary | ICD-10-CM | POA: Diagnosis not present

## 2011-10-29 DIAGNOSIS — I10 Essential (primary) hypertension: Secondary | ICD-10-CM | POA: Insufficient documentation

## 2011-10-29 DIAGNOSIS — Z79899 Other long term (current) drug therapy: Secondary | ICD-10-CM | POA: Diagnosis not present

## 2011-10-29 DIAGNOSIS — R930 Abnormal findings on diagnostic imaging of skull and head, not elsewhere classified: Secondary | ICD-10-CM

## 2011-10-29 DIAGNOSIS — M129 Arthropathy, unspecified: Secondary | ICD-10-CM | POA: Insufficient documentation

## 2011-10-29 DIAGNOSIS — E039 Hypothyroidism, unspecified: Secondary | ICD-10-CM | POA: Diagnosis not present

## 2011-10-29 DIAGNOSIS — I059 Rheumatic mitral valve disease, unspecified: Secondary | ICD-10-CM | POA: Diagnosis not present

## 2011-10-29 DIAGNOSIS — R0789 Other chest pain: Secondary | ICD-10-CM | POA: Diagnosis not present

## 2011-10-29 DIAGNOSIS — R0609 Other forms of dyspnea: Secondary | ICD-10-CM

## 2011-10-29 DIAGNOSIS — I498 Other specified cardiac arrhythmias: Secondary | ICD-10-CM | POA: Diagnosis not present

## 2011-10-29 DIAGNOSIS — R0602 Shortness of breath: Secondary | ICD-10-CM | POA: Diagnosis not present

## 2011-10-29 LAB — POCT I-STAT, CHEM 8
Glucose, Bld: 113 mg/dL — ABNORMAL HIGH (ref 70–99)
HCT: 38 % (ref 36.0–46.0)
Hemoglobin: 12.9 g/dL (ref 12.0–15.0)
Potassium: 4 mEq/L (ref 3.5–5.1)
Sodium: 143 mEq/L (ref 135–145)

## 2011-10-29 LAB — CBC WITH DIFFERENTIAL/PLATELET
Basophils Relative: 1 % (ref 0–1)
Eosinophils Absolute: 0.2 10*3/uL (ref 0.0–0.7)
MCH: 31.9 pg (ref 26.0–34.0)
MCHC: 34.1 g/dL (ref 30.0–36.0)
Neutrophils Relative %: 58 % (ref 43–77)
Platelets: 328 10*3/uL (ref 150–400)
RDW: 13.3 % (ref 11.5–15.5)

## 2011-10-29 LAB — POCT I-STAT TROPONIN I

## 2011-10-29 LAB — TROPONIN I: Troponin I: <0.3 ng/mL (ref <0.30)

## 2011-10-29 LAB — LIPID PANEL
Cholesterol: 181 mg/dL (ref 0–200)
Total CHOL/HDL Ratio: 3.3 RATIO
Triglycerides: 118 mg/dL (ref <150)
VLDL: 24 mg/dL (ref 0–40)

## 2011-10-29 MED ORDER — VITAMIN D3 25 MCG (1000 UNIT) PO TABS
1000.0000 [IU] | ORAL_TABLET | Freq: Every day | ORAL | Status: DC
  Administered 2011-10-29: 1000 [IU] via ORAL
  Filled 2011-10-29 (×2): qty 1

## 2011-10-29 MED ORDER — NITROGLYCERIN 2 % TD OINT
0.5000 [in_us] | TOPICAL_OINTMENT | Freq: Four times a day (QID) | TRANSDERMAL | Status: DC
  Administered 2011-10-29: 0.5 [in_us] via TOPICAL
  Filled 2011-10-29: qty 1

## 2011-10-29 MED ORDER — ENOXAPARIN SODIUM 40 MG/0.4ML ~~LOC~~ SOLN
40.0000 mg | SUBCUTANEOUS | Status: DC
  Administered 2011-10-29: 40 mg via SUBCUTANEOUS
  Filled 2011-10-29 (×2): qty 0.4

## 2011-10-29 MED ORDER — HYDROCODONE-ACETAMINOPHEN 5-325 MG PO TABS: 1.0000 | ORAL_TABLET | ORAL | Status: DC | PRN

## 2011-10-29 MED ORDER — DOCUSATE SODIUM 100 MG PO CAPS
100.0000 mg | ORAL_CAPSULE | Freq: Two times a day (BID) | ORAL | Status: DC
  Filled 2011-10-29 (×2): qty 1

## 2011-10-29 MED ORDER — SIMVASTATIN 10 MG PO TABS
10.0000 mg | ORAL_TABLET | Freq: Every day | ORAL | Status: DC
  Administered 2011-10-29: 10 mg via ORAL
  Filled 2011-10-29 (×2): qty 1

## 2011-10-29 MED ORDER — ACETAMINOPHEN 325 MG PO TABS: 650.0000 mg | ORAL_TABLET | Freq: Four times a day (QID) | ORAL | Status: DC | PRN

## 2011-10-29 MED ORDER — ONDANSETRON HCL 4 MG/2ML IJ SOLN: 4.0000 mg | Freq: Four times a day (QID) | INTRAMUSCULAR | Status: DC | PRN

## 2011-10-29 MED ORDER — SODIUM CHLORIDE 0.9 % IJ SOLN
3.0000 mL | Freq: Two times a day (BID) | INTRAMUSCULAR | Status: DC
  Administered 2011-10-29 (×2): 3 mL via INTRAVENOUS

## 2011-10-29 MED ORDER — ALUM & MAG HYDROXIDE-SIMETH 200-200-20 MG/5ML PO SUSP: 30.0000 mL | Freq: Four times a day (QID) | ORAL | Status: DC | PRN

## 2011-10-29 MED ORDER — ONDANSETRON HCL 4 MG/2ML IJ SOLN: 4.0000 mg | Freq: Three times a day (TID) | INTRAMUSCULAR | Status: DC | PRN

## 2011-10-29 MED ORDER — ALBUTEROL SULFATE (5 MG/ML) 0.5% IN NEBU: 2.5000 mg | INHALATION_SOLUTION | RESPIRATORY_TRACT | Status: DC | PRN

## 2011-10-29 MED ORDER — HYDRALAZINE HCL 20 MG/ML IJ SOLN
10.0000 mg | INTRAMUSCULAR | Status: DC | PRN
  Filled 2011-10-29: qty 0.5

## 2011-10-29 MED ORDER — AMLODIPINE BESYLATE 5 MG PO TABS
5.0000 mg | ORAL_TABLET | Freq: Every day | ORAL | Status: DC
  Administered 2011-10-29: 5 mg via ORAL
  Filled 2011-10-29 (×2): qty 1

## 2011-10-29 MED ORDER — SODIUM CHLORIDE 0.9 % IV SOLN: INTRAVENOUS | Status: AC

## 2011-10-29 MED ORDER — NEBIVOLOL HCL 5 MG PO TABS
5.0000 mg | ORAL_TABLET | Freq: Every day | ORAL | Status: DC
  Administered 2011-10-29: 5 mg via ORAL
  Filled 2011-10-29 (×2): qty 1

## 2011-10-29 MED ORDER — ONDANSETRON HCL 4 MG PO TABS: 4.0000 mg | ORAL_TABLET | Freq: Four times a day (QID) | ORAL | Status: DC | PRN

## 2011-10-29 MED ORDER — GUAIFENESIN-DM 100-10 MG/5ML PO SYRP: 5.0000 mL | ORAL_SOLUTION | ORAL | Status: DC | PRN

## 2011-10-29 MED ORDER — ACETAMINOPHEN 650 MG RE SUPP: 650.0000 mg | Freq: Four times a day (QID) | RECTAL | Status: DC | PRN

## 2011-10-29 MED ORDER — LEVOTHYROXINE SODIUM 25 MCG PO TABS
25.0000 ug | ORAL_TABLET | Freq: Every day | ORAL | Status: DC
  Administered 2011-10-29 – 2011-10-30 (×2): 25 ug via ORAL
  Filled 2011-10-29 (×3): qty 1

## 2011-10-29 MED ORDER — ASPIRIN 81 MG PO CHEW
81.0000 mg | CHEWABLE_TABLET | Freq: Every day | ORAL | Status: DC
  Administered 2011-10-29: 81 mg via ORAL
  Filled 2011-10-29 (×2): qty 1

## 2011-10-29 NOTE — H&P (Addendum)
PCP:   Gaye Alken, MD    Chief Complaint:   Chest pain and elevated blood pressure  HPI: [name] is a 78 y.o. female   has a past medical history of Hypertension; Hyperlipidemia; Tachycardia; Hypothyroidism; Dyspnea; Diastolic dysfunction; Mild mitral regurgitation by prior echocardiogram; Bradycardia; and Arthritis.   Presented with  Woke up this am and felt sweaty and her face was prickly. She took her blood pressure and it was elevated to 190's. She have not had this before. She also felt slight chest discomfort that radiated to  Her throat. Once she arrived to ER her Blood pressure has improved and now it is down to 141/47. No shortness of breath this occurered at rest.   Review of Systems:    Pertinent positives include: chest pain, nausea,  Constitutional:  No weight loss, night sweats, Fevers, chills, fatigue, weight loss  HEENT:  No headaches, Difficulty swallowing,Tooth/dental problems,Sore throat,  No sneezing, itching, ear ache, nasal congestion, post nasal drip,  Cardio-vascular:  No  Orthopnea, PND, anasarca, dizziness, palpitations.no Bilateral lower extremity swelling  GI:  No heartburn, indigestion, abdominal pain,  vomiting, diarrhea, change in bowel habits, loss of appetite, melena, blood in stool, hematemesis Resp:  no shortness of breath at rest. No dyspnea on exertion, No excess mucus, no productive cough, No non-productive cough, No coughing up of blood.No change in color of mucus.No wheezing. Skin:  no rash or lesions. No jaundice GU:  no dysuria, change in color of urine, no urgency or frequency. No straining to urinate.  No flank pain.  Musculoskeletal:  No joint pain or no joint swelling. No decreased range of motion. No back pain.  Psych:  No change in mood or affect. No depression or anxiety. No memory loss.  Neuro: no localizing neurological complaints, no tingling, no weakness, no double vision, no gait abnormality, no slurred  speech, no confusion  Otherwise ROS are negative except for above, 10 systems were reviewed  Past Medical History: Past Medical History  Diagnosis Date  . Hypertension   . Hyperlipidemia   . Tachycardia     history of tachycardia and bradycardia  . Hypothyroidism   . Dyspnea   . Diastolic dysfunction     mild   . Mild mitral regurgitation by prior echocardiogram   . Bradycardia     hospitalized for   . Arthritis    Past Surgical History  Procedure Date  . Total abdominal hysterectomy   . Cholecystectomy   . Extracorporeal shock wave lithotripsy   . Tonsillectomy      Medications: Prior to Admission medications   Medication Sig Start Date End Date Taking? Authorizing Provider  acetaminophen (TYLENOL) 500 MG tablet Take 500 mg by mouth every 6 (six) hours as needed. For pain   Yes Historical Provider, MD  amLODipine (NORVASC) 5 MG tablet Take 1 tablet (5 mg total) by mouth daily. 10/24/11  Yes Vesta Mixer, MD  aspirin 81 MG tablet Take by mouth. 1/4 tablet daily     Yes Historical Provider, MD  cholecalciferol (VITAMIN D) 1000 UNITS tablet Take 1,000 Units by mouth daily.     Yes Historical Provider, MD  levothyroxine (SYNTHROID, LEVOTHROID) 25 MCG tablet Take 25 mcg by mouth daily.     Yes Historical Provider, MD  Multiple Vitamin (MULTIVITAMIN WITH MINERALS) TABS Take 1 tablet by mouth daily.   Yes Historical Provider, MD  nebivolol (BYSTOLIC) 5 MG tablet Take 1 tablet (5 mg total) by mouth daily. 07/02/11  Yes Vesta Mixer, MD  pravastatin (PRAVACHOL) 20 MG tablet Take 20 mg by mouth daily.     Yes Historical Provider, MD    Allergies:  No Known Allergies  Social History:  Ambulatory   independently   Lives at [facility].   reports that she has never smoked. She has never used smokeless tobacco. She reports that she does not drink alcohol or use illicit drugs.   Family History: family history includes Cancer in her brother; Emphysema  in her father; Heart attack in her brother; Heart failure in her mother; and Stroke in her brother.    Physical Exam: Patient Vitals for the past 24 hrs:  BP Temp Temp src Pulse Resp SpO2  10/29/11 0427 124/58 mmHg - - 94  21  99 %  10/29/11 0300 161/61 mmHg - - 81  23  100 %  10/29/11 0246 184/67 mmHg 98.5 F (36.9 C) Oral 89  18  97 %    1. General:  in No Acute distress 2. Psychological: Alert and   Oriented 3. Head/ENT:   Moist  Mucous Membranes                          Head Non traumatic, neck supple                          Normal  Dentition 4. SKIN: normal Skin turgor,  Skin clean Dry and intact no rash 5. Heart: Regular rate and rhythm no Murmur, Rub or gallop 6. Lungs: Clear to auscultation bilaterally, no wheezes or crackles   7. Abdomen: Soft, non-tender, Non distended 8. Lower extremities: no clubbing, cyanosis, or edema 9. Neurologically Grossly intact, moving all 4 extremities equally 10. MSK: Normal range of motion  body mass index is unknown because there is no height or weight on file.   Labs on Admission:   [facility] 10/29/11 0343  NA 143  K 4.0  CL 107  CO2 --  GLUCOSE 113*  BUN 11  CREATININE 0.80  CALCIUM --  MG --  PHOS --   No results found for this basename: AST:2,ALT:2,ALKPHOS:2,BILITOT:2,PROT:2,ALBUMIN:2 in the last 72 hours No results found for this basename: LIPASE:2,AMYLASE:2 in the last 72 hours  Basename 10/29/11 0343 10/29/11 0334  WBC -- 6.9  NEUTROABS -- 4.0  HGB 12.9 12.9  HCT 38.0 37.8  MCV -- 93.3  PLT -- 328   No results found for this basename: CKTOTAL:3,CKMB:3,CKMBINDEX:3,TROPONINI:3 in the last 72 hours No results found for this basename: TSH,T4TOTAL,FREET3,T3FREE,THYROIDAB in the last 72 hours No results found for this basename: VITAMINB12:2,FOLATE:2,FERRITIN:2,TIBC:2,IRON:2,RETICCTPCT:2 in the last 72 hours Lab Results  Component Value Date   HGBA1C  Value: 6.0 (NOTE)                                                                        According to the ADA Clinical Practice Recommendations for 2011, when HbA1c is used as a screening test:   >=6.5%   Diagnostic of Diabetes Mellitus           (if abnormal result  is confirmed)  5.7-6.4%   Increased risk of developing Diabetes  [place]  References:Diagnosis and Classification of Diabetes Mellitus,Diabetes Care,2011,34(Suppl 1):S62-S69 and Standards of Medical Care in         Diabetes - 2011,Diabetes Care,2011,34  (Suppl 1):S11-S61.* 10/02/2009    The CrCl is unknown because both a height and weight (above a minimum accepted value) are required for this calculation. ABG    Component Value Date/Time   TCO2 23 10/29/2011 0343     Lab Results  Component Value Date   DDIMER  Value: 0.72        AT THE INHOUSE ESTABLISHED CUTOFF VALUE OF 0.48 ug/mL FEU, THIS ASSAY HAS BEEN DOCUMENTED IN THE LITERATURE TO HAVE A SENSITIVITY AND NEGATIVE PREDICTIVE VALUE OF AT LEAST 98 TO 99%.  THE TEST RESULT SHOULD BE CORRELATED WITH AN ASSESSMENT OF THE CLINICAL PROBABILITY OF DVT / VTE.* 10/01/2009     Other results:  I have pearsonaly reviewed this: ECG REPORT  Rate: 82  Rhythm: Normal sinus rythm ST&T Change: no ischemic changes  Cultures:    Component Value Date/Time   SDES URINE, CLEAN CATCH 11/18/2009 1808   SPECREQUEST NONE 11/18/2009 1808   CULT NO GROWTH 11/18/2009 1808   REPTSTATUS 11/20/2009 FINAL 11/18/2009 1808       Radiological Exams on Admission: Dg Chest Port 1 View  10/29/2011  *RADIOLOGY REPORT*  Clinical Data: Increased blood pressure.  Shortness of breath. Chest discomfort.  PORTABLE CHEST - 1 VIEW  Comparison: 11/20/2009  Findings: The heart size and pulmonary vascularity are normal. The lungs appear clear and expanded without focal air space disease or consolidation. No blunting of the costophrenic angles.  No pneumothorax.  Mediastinal contours appear intact.  Calcification of the aorta.  No significant change since previous study.  IMPRESSION: No  evidence of active pulmonary disease.   Original Report Authenticated By: Marlon Pel, M.D.     Chart has been reviewed  Assessment/Plan  78 yo F with hx of HTN with hypertensive urgency and chest pain  Present on Admission:  .HTN (hypertension) - patient probably had an episode of hypertensive urgency with elevated blood pressure and chest pain currently this had resolved her blood pressure stabilized and is back to normal after patient has been put on nitro paste actually currently blood pressures down to 124/58. Would DC nitroglycerin patch and continue home regimen.  .Chest pain -in the setting of severe hypertension currently resolved will admit cycle cardiac markers. Obtain serial EKGs. Echo gram in a.m. Patient would likely benefit from a stress test in the near future.    Prophylaxis:  Lovenox, Protonix  CODE STATUS: DNR/DNI as per patient   Other plan as per orders.  I have spent a total of 55 min on this admission  Jamayah Myszka 10/29/2011, 4:41 AM

## 2011-10-29 NOTE — Care Management Note (Unsigned)
    Page 1 of 1   10/29/2011     3:09:39 PM   CARE MANAGEMENT NOTE 10/29/2011  Patient:  [name], [name]   Account Number:  [id number]  Date Initiated:  10/29/2011  Documentation initiated by:  Jerimah Witucki  Subjective/Objective Assessment:   PT ADM ON 10/29/11 WITH CHEST PAIN, R/O MI.  PTA, PT INDEPENDENT, LIVES WITH SPOUSE.     Action/Plan:   WILL FOLLOW FOR HOME NEEDS AS PT PROGRESSES.   Anticipated DC Date:  10/30/2011   Anticipated DC Plan:  HOME/SELF CARE      DC Planning Services  CM consult      Choice offered to / List presented to:             Status of service:  In process, will continue to follow Medicare Important Message given?   (If response is "NO", the following Medicare IM given date fields will be blank) Date Medicare IM given:   Date Additional Medicare IM given:    Discharge Disposition:    Per UR Regulation:  Reviewed for med. necessity/level of care/duration of stay  If discussed at Long Length of Stay Meetings, dates discussed:    Comments:

## 2011-10-29 NOTE — Progress Notes (Signed)
Patient seen and evaluated earlier this AM by my associate.  Agree with plan and assessment.  Please refer to H and P for further details.  Work-up currently negative.  Will f/u with pending results and make further recommendations pending results and hospital course.  Jakoby Melendrez, [employer]

## 2011-10-29 NOTE — ED Notes (Signed)
Pt went to bed approx 2130 and woke with "lump in her throat and nausia" about 0150. The lump feeling moved down into her chest and EMS was called and [facility] nurse gave patient 325 of ASA. Ems arrived and started IV and got vitals of 194/80 HR 80 SPO2 of 99 on RA. 2x 12 lead EKGs showed NSR without change.   Pt has advance care [name] will documents present on arrival and Onalee Hua (husband) at the bed side

## 2011-10-29 NOTE — ED Provider Notes (Signed)
History     CSN: [id number]  Arrival date & time 10/29/11  0236   First MD Initiated Contact with Patient 10/29/11 0239      Chief Complaint  Patient presents with  . Chest Pain    (Consider location/radiation/quality/duration/timing/severity/associated sxs/prior treatment) HPI Comments: 78 year old female with a history of hypertension and hyperlipidemia who presents with a complaint of chest pain. She states that she awoke this evening with a feeling of heaviness intermittent upper chest, associated abnormal feeling in her throat though this is poorly described. When she awoke she was diaphoretic and mildly nauseated though this has essentially resolved at this point. Currently she has minimal however still present symptoms in her chest.  She denies coughing fevers chills vomiting diarrhea dysuria swelling rashes headaches blurred vision or sore throat. The symptoms have been persistent, they have been ongoing for approximately last hour and she was noted to be hypertensive at 195/80 by EMS on arrival. Aspirin was given prior to arrival 325 mg  Review of the medical record shows that her cardiologist Dr. Elease Hashimoto has evaluated her in the past, she has had an echocardiogram in 2011 showing normal ejection fraction with moderate left ventricular hypertrophy, stress test in 2002 showing no ischemia. She denies having any other recent testing including cardiac catheterization or stress testing.  Patient is a 78 y.o. female presenting with chest pain. The history is provided by the patient.  Chest Pain     Past Medical History  Diagnosis Date  . Hypertension   . Hyperlipidemia   . Tachycardia     history of tachycardia and bradycardia  . Hypothyroidism   . Dyspnea   . Diastolic dysfunction     mild   . Mild mitral regurgitation by prior echocardiogram   . Bradycardia     hospitalized for   . Arthritis     Past Surgical History  Procedure Date  . Total abdominal hysterectomy     . Cholecystectomy   . Extracorporeal shock wave lithotripsy   . Tonsillectomy     Family History  Problem Relation Age of Onset  . Emphysema Father   . Heart failure Mother   . Heart attack Brother   . Cancer Brother     kidney  . Stroke Brother     History  Substance Use Topics  . Smoking status: Never Smoker   . Smokeless tobacco: Never Used  . Alcohol Use: No    OB History    Grav Para Term Preterm Abortions TAB SAB Ect Mult Living                  Review of Systems  Cardiovascular: Positive for chest pain.  All other systems reviewed and are negative.    Allergies  Review of patient's allergies indicates no known allergies.  Home Medications   Current Outpatient Rx  Name Route Sig Dispense Refill  . ACETAMINOPHEN 500 MG PO TABS Oral Take 500 mg by mouth every 6 (six) hours as needed. For pain    . AMLODIPINE BESYLATE 5 MG PO TABS Oral Take 1 tablet (5 mg total) by mouth daily. 90 tablet 0    Pt needs appointment then refill can be made  . ASPIRIN 81 MG PO TABS Oral Take by mouth. 1/4 tablet daily      . VITAMIN D 1000 UNITS PO TABS Oral Take 1,000 Units by mouth daily.      [name] LEVOTHYROXINE SODIUM 25 MCG PO TABS Oral Take 25 mcg  by mouth daily.      . ADULT MULTIVITAMIN W/MINERALS CH Oral Take 1 tablet by mouth daily.    . NEBIVOLOL HCL 5 MG PO TABS Oral Take 1 tablet (5 mg total) by mouth daily. 90 tablet 3  . PRAVASTATIN SODIUM 20 MG PO TABS Oral Take 20 mg by mouth daily.        BP 161/61  Pulse 81  Temp 98.5 F (36.9 C) (Oral)  Resp 23  SpO2 100%  Physical Exam  Nursing note and vitals reviewed. Constitutional: She appears well-developed and well-nourished. No distress.  HENT:  Head: Normocephalic and atraumatic.  Mouth/Throat: Oropharynx is clear and moist. No oropharyngeal exudate.  Eyes: Conjunctivae normal and EOM are normal. Pupils are equal, round, and reactive to light. Right eye exhibits no discharge. Left eye exhibits no discharge.  No scleral icterus.  Neck: Normal range of motion. Neck supple. No JVD present. No thyromegaly present.  Cardiovascular: Normal rate, regular rhythm and intact distal pulses.  Exam reveals no gallop and no friction rub.   Murmur (soft systolic murmur) heard. Pulmonary/Chest: Effort normal and breath sounds normal. No respiratory distress. She has no wheezes. She has no rales.  Abdominal: Soft. Bowel sounds are normal. She exhibits no distension and no mass. There is no tenderness.  Musculoskeletal: Normal range of motion. She exhibits no edema and no tenderness.  Lymphadenopathy:    She has no cervical adenopathy.  Neurological: She is alert. Coordination normal.  Skin: Skin is warm and dry. No rash noted. No erythema.  Psychiatric: She has a normal mood and affect. Her behavior is normal.    ED Course  Procedures (including critical care time)  Labs Reviewed  POCT I-STAT, CHEM 8 - Abnormal; Notable for the following:    Glucose, Bld 113 (*)     All other components within normal limits  CBC WITH DIFFERENTIAL  POCT I-STAT TROPONIN I   Dg Chest Port 1 View  10/29/2011  *RADIOLOGY REPORT*  Clinical Data: Increased blood pressure.  Shortness of breath. Chest discomfort.  PORTABLE CHEST - 1 VIEW  Comparison: 11/20/2009  Findings: The heart size and pulmonary vascularity are normal. The lungs appear clear and expanded without focal air space disease or consolidation. No blunting of the costophrenic angles.  No pneumothorax.  Mediastinal contours appear intact.  Calcification of the aorta.  No significant change since previous study.  IMPRESSION: No evidence of active pulmonary disease.   Original Report Authenticated By: Marlon Pel, M.D.      1. Chest pain       MDM  At this time the patient is not appear to be in any acute distress, she has hypertension with a blood pressure of 184/67, no tachycardia and no fever. Her lung sounds are normal, she has a soft murmur but no other  findings on her cardiac exam and her EKG shows normal sinus rhythm with no signs of ischemia and no changes since November of 2011. We'll proceed with cardiac workers , nitro paste, reevaluate.  ED ECG REPORT  I personally interpreted this EKG   Date: 10/29/2011   Rate: 82  Rhythm: normal sinus rhythm  QRS Axis: normal  Intervals: normal  ST/T Wave abnormalities: normal  Conduction Disutrbances:none  Narrative Interpretation:   Old EKG Reviewed: unchanged  Labs negative, pt is sx free at this time - will admit for r/o ACS  D/W Dr. Adela Glimpse - will admit.       Vida Roller, MD  10/29/11 0418 

## 2011-10-30 DIAGNOSIS — E039 Hypothyroidism, unspecified: Secondary | ICD-10-CM | POA: Diagnosis not present

## 2011-10-30 DIAGNOSIS — I369 Nonrheumatic tricuspid valve disorder, unspecified: Secondary | ICD-10-CM | POA: Diagnosis not present

## 2011-10-30 DIAGNOSIS — R079 Chest pain, unspecified: Secondary | ICD-10-CM | POA: Diagnosis not present

## 2011-10-30 DIAGNOSIS — I10 Essential (primary) hypertension: Secondary | ICD-10-CM | POA: Diagnosis not present

## 2011-10-30 LAB — COMPREHENSIVE METABOLIC PANEL
AST: 18 U/L (ref 0–37)
Albumin: 3.2 g/dL — ABNORMAL LOW (ref 3.5–5.2)
Alkaline Phosphatase: 76 U/L (ref 39–117)
BUN: 12 mg/dL (ref 6–23)
CO2: 20 mEq/L (ref 19–32)
Chloride: 108 mEq/L (ref 96–112)
Creatinine, Ser: 0.59 mg/dL (ref 0.50–1.10)
GFR calc non Af Amer: 79 mL/min — ABNORMAL LOW (ref >90)
Potassium: 4.2 mEq/L (ref 3.5–5.1)
Total Bilirubin: 0.4 mg/dL (ref 0.3–1.2)

## 2011-10-30 LAB — CBC
MCH: 32.2 pg (ref 26.0–34.0)
MCV: 96.1 fL (ref 78.0–100.0)
Platelets: 254 10*3/uL (ref 150–400)
RBC: 3.85 MIL/uL — ABNORMAL LOW (ref 3.87–5.11)
RDW: 13.8 % (ref 11.5–15.5)
WBC: 8.1 10*3/uL (ref 4.0–10.5)

## 2011-10-30 NOTE — Progress Notes (Signed)
  Echocardiogram 2D Echocardiogram has been performed.  [name] 10/30/2011, 9:15 AM

## 2011-10-30 NOTE — Progress Notes (Signed)
Pt discharge instructions and patient education complete. IV site d/c. Site WNL. No s/s of distress. Discharge home with husband. Dion Saucier

## 2011-10-30 NOTE — Discharge Summary (Signed)
Physician Discharge Summary  ALFREDA HAMMAD [name]:[id number] DOB: [birth year] DOA: 10/29/2011  PCP: Gaye Alken, MD  Admit date: 10/29/2011 Discharge date: 10/30/2011  Recommendations for Outpatient Follow-up:  1. Please be sure to follow up with cardiologist or pcp in 1-2 weeks or sooner. 2. Check blood pressure  Discharge Diagnoses:  Active Problems:  HTN (hypertension)  Chest pain   Discharge Condition: stable  Diet recommendation: cardiac  Filed Weights   10/29/11 0533  Weight: 69.8 kg (153 lb 14.1 oz)    History of present illness:  From original HPI: [name] is a 78 y.o. female  has a past medical history of Hypertension; Hyperlipidemia; Tachycardia; Hypothyroidism; Dyspnea; Diastolic dysfunction; Mild mitral regurgitation by prior echocardiogram; Bradycardia; and Arthritis.  Presented with  Woke up this am and felt sweaty and her face was prickly. She took her blood pressure and it was elevated to 190's. She have not had this before. She also felt slight chest discomfort that radiated to Her throat. Once she arrived to ER her Blood pressure has improved and now it is down to 141/47. No shortness of breath this occurered at rest.   Hospital Course:  Chest Pain - resolved shortly after admission.  No chest discomfort reported on day of discharge or yesterday 10/16.   - Cardiac enzymes were negative x 4 - Echocardiogram results pending which patient wishes to go over with her cardiologist - Given no current chest discomfort, negative CE's, and negative EKG (no st elevations or depressions) will discharge today 10/17 and have patient f/u with cardiologist for further evaluation and recommendations.  Patient is aware and agreeable. - Chest discomfort may have been 2ary to reflux and have recommended trial of PPI (omeprazole 20 mg po qday)  HTN - Well controlled currently on current regimen.  Will plan on continuing as outpatient.  Hypothyroidism - Stable  continue home regimen.  Procedures:  Echocardiogram  Consultations:  none  Discharge Exam: Filed Vitals:   10/29/11 0427 10/29/11 0533 10/29/11 2120 10/30/11 0620  BP: 124/58 136/68 125/54 137/70  Pulse: 94 76 85 78  Temp:  98 F (36.7 C) 98.9 F (37.2 C) 97.9 F (36.6 C)  TempSrc:  Oral Oral Oral  Resp: 21 18 18 18   Height:  5\' 4"  (1.626 m)    Weight:  69.8 kg (153 lb 14.1 oz)    SpO2: 99% 99% 99% 97%    General: Pt in NAD, A and O x 3, comfortable laying supine Cardiovascular: RRR, No MRG Respiratory: CTA BL, no wheezes Abdomen: Soft, NT, ND  Discharge Instructions  Discharge Orders    Future Orders Please Complete By Expires   Diet - low sodium heart healthy      Increase activity slowly      Discharge instructions      Comments:   Please be sure to follow up with your cardiologist in 1-2 weeks or sooner should any new concerns arise.   Call MD for:  temperature >100.4      Call MD for:  extreme fatigue      Call MD for:  persistant dizziness or light-headedness          Medication List     As of 10/30/2011 10:42 AM    STOP taking these medications         acetaminophen 500 MG tablet   Commonly known as: TYLENOL      TAKE these medications         amLODipine 5  MG tablet   Commonly known as: NORVASC   Take 1 tablet (5 mg total) by mouth daily.      aspirin 81 MG tablet   Take by mouth. 1/4 tablet daily        cholecalciferol 1000 UNITS tablet   Commonly known as: VITAMIN D   Take 1,000 Units by mouth daily.      levothyroxine 25 MCG tablet   Commonly known as: SYNTHROID, LEVOTHROID   Take 25 mcg by mouth daily.      multivitamin with minerals Tabs   Take 1 tablet by mouth daily.      nebivolol 5 MG tablet   Commonly known as: BYSTOLIC   Take 1 tablet (5 mg total) by mouth daily.      pravastatin 20 MG tablet   Commonly known as: PRAVACHOL   Take 20 mg by mouth daily.          The results of significant diagnostics from this  hospitalization (including imaging, microbiology, ancillary and laboratory) are listed below for reference.    Significant Diagnostic Studies: Dg Chest Port 1 View  10/29/2011  *RADIOLOGY REPORT*  Clinical Data: Increased blood pressure.  Shortness of breath. Chest discomfort.  PORTABLE CHEST - 1 VIEW  Comparison: 11/20/2009  Findings: The heart size and pulmonary vascularity are normal. The lungs appear clear and expanded without focal air space disease or consolidation. No blunting of the costophrenic angles.  No pneumothorax.  Mediastinal contours appear intact.  Calcification of the aorta.  No significant change since previous study.  IMPRESSION: No evidence of active pulmonary disease.   Original Report Authenticated By: Marlon Pel, M.D.     Microbiology: No results found for this or any previous visit (from the past 240 hour(s)).   Labs: Basic Metabolic Panel:  Lab 10/30/11 1610 10/29/11 0343  NA 139 143  K 4.2 4.0  CL 108 107  CO2 20 --  GLUCOSE 98 113*  BUN 12 11  CREATININE 0.59 0.80  CALCIUM 9.1 --  MG 2.3 --  PHOS 3.3 --   Liver Function Tests:  Lab 10/30/11 0440  AST 18  ALT 11  ALKPHOS 76  BILITOT 0.4  PROT 7.0  ALBUMIN 3.2*   No results found for this basename: LIPASE:5,AMYLASE:5 in the last 168 hours No results found for this basename: AMMONIA:5 in the last 168 hours CBC:  Lab 10/30/11 0440 10/29/11 0343 10/29/11 0334  WBC 8.1 -- 6.9  NEUTROABS -- -- 4.0  HGB 12.4 12.9 12.9  HCT 37.0 38.0 37.8  MCV 96.1 -- 93.3  PLT 254 -- 328   Cardiac Enzymes:  Lab 10/29/11 2120 10/29/11 1547 10/29/11 0835  CKTOTAL -- -- --  CKMB -- -- --  CKMBINDEX -- -- --  TROPONINI <0.30 <0.30 <0.30   BNP: BNP (last 3 results) No results found for this basename: PROBNP:3 in the last 8760 hours CBG: No results found for this basename: GLUCAP:5 in the last 168 hours  Time coordinating discharge: > 35 minutes  Signed:  Penny Pia  [facility] 10/30/2011, 10:42 AM

## 2011-11-03 DIAGNOSIS — M79609 Pain in unspecified limb: Secondary | ICD-10-CM | POA: Diagnosis not present

## 2011-11-27 DIAGNOSIS — E7800 Pure hypercholesterolemia, unspecified: Secondary | ICD-10-CM | POA: Diagnosis not present

## 2011-11-27 DIAGNOSIS — Z23 Encounter for immunization: Secondary | ICD-10-CM | POA: Diagnosis not present

## 2011-11-27 DIAGNOSIS — Z5189 Encounter for other specified aftercare: Secondary | ICD-10-CM | POA: Diagnosis not present

## 2011-11-27 DIAGNOSIS — E039 Hypothyroidism, unspecified: Secondary | ICD-10-CM | POA: Diagnosis not present

## 2011-11-27 DIAGNOSIS — Z1331 Encounter for screening for depression: Secondary | ICD-10-CM | POA: Diagnosis not present

## 2011-11-27 DIAGNOSIS — I10 Essential (primary) hypertension: Secondary | ICD-10-CM | POA: Diagnosis not present

## 2012-02-02 ENCOUNTER — Encounter (HOSPITAL_COMMUNITY): Payer: Self-pay | Admitting: Family Medicine

## 2012-02-02 ENCOUNTER — Emergency Department (HOSPITAL_COMMUNITY): Payer: Medicare Other

## 2012-02-02 ENCOUNTER — Emergency Department (HOSPITAL_COMMUNITY)
Admission: EM | Admit: 2012-02-02 | Discharge: 2012-02-02 | Disposition: A | Discharge status: Home / Self Care | Payer: Medicare Other | Attending: Emergency Medicine | Admitting: Emergency Medicine

## 2012-02-02 DIAGNOSIS — Z7982 Long term (current) use of aspirin: Secondary | ICD-10-CM | POA: Insufficient documentation

## 2012-02-02 DIAGNOSIS — Z79899 Other long term (current) drug therapy: Secondary | ICD-10-CM | POA: Diagnosis not present

## 2012-02-02 DIAGNOSIS — R0789 Other chest pain: Secondary | ICD-10-CM

## 2012-02-02 DIAGNOSIS — Z8679 Personal history of other diseases of the circulatory system: Secondary | ICD-10-CM | POA: Insufficient documentation

## 2012-02-02 DIAGNOSIS — I10 Essential (primary) hypertension: Secondary | ICD-10-CM | POA: Insufficient documentation

## 2012-02-02 DIAGNOSIS — E039 Hypothyroidism, unspecified: Secondary | ICD-10-CM | POA: Insufficient documentation

## 2012-02-02 DIAGNOSIS — R110 Nausea: Secondary | ICD-10-CM | POA: Insufficient documentation

## 2012-02-02 DIAGNOSIS — R42 Dizziness and giddiness: Secondary | ICD-10-CM | POA: Insufficient documentation

## 2012-02-02 DIAGNOSIS — J449 Chronic obstructive pulmonary disease, unspecified: Secondary | ICD-10-CM | POA: Diagnosis not present

## 2012-02-02 DIAGNOSIS — K219 Gastro-esophageal reflux disease without esophagitis: Secondary | ICD-10-CM

## 2012-02-02 DIAGNOSIS — Z8739 Personal history of other diseases of the musculoskeletal system and connective tissue: Secondary | ICD-10-CM | POA: Diagnosis not present

## 2012-02-02 DIAGNOSIS — E785 Hyperlipidemia, unspecified: Secondary | ICD-10-CM | POA: Insufficient documentation

## 2012-02-02 DIAGNOSIS — R5381 Other malaise: Secondary | ICD-10-CM | POA: Diagnosis not present

## 2012-02-02 DIAGNOSIS — J438 Other emphysema: Secondary | ICD-10-CM | POA: Diagnosis not present

## 2012-02-02 LAB — CBC WITH DIFFERENTIAL/PLATELET
Basophils Absolute: 0.1 10*3/uL (ref 0.0–0.1)
Eosinophils Absolute: 0.1 10*3/uL (ref 0.0–0.7)
Lymphocytes Relative: 31 % (ref 12–46)
Lymphs Abs: 2.7 10*3/uL (ref 0.7–4.0)
MCH: 32.3 pg (ref 26.0–34.0)
Neutrophils Relative %: 62 % (ref 43–77)
Platelets: 311 10*3/uL (ref 150–400)
RBC: 4.55 MIL/uL (ref 3.87–5.11)
RDW: 13.6 % (ref 11.5–15.5)
WBC: 8.8 10*3/uL (ref 4.0–10.5)

## 2012-02-02 LAB — COMPREHENSIVE METABOLIC PANEL
ALT: 12 U/L (ref 0–35)
AST: 18 U/L (ref 0–37)
Alkaline Phosphatase: 97 U/L (ref 39–117)
Calcium: 9.7 mg/dL (ref 8.4–10.5)
GFR calc Af Amer: >90 mL/min (ref >90)
Glucose, Bld: 117 mg/dL — ABNORMAL HIGH (ref 70–99)
Potassium: 3.8 mEq/L (ref 3.5–5.1)
Sodium: 138 mEq/L (ref 135–145)
Total Protein: 8.4 g/dL — ABNORMAL HIGH (ref 6.0–8.3)

## 2012-02-02 LAB — TROPONIN I: Troponin I: <0.3 ng/mL (ref <0.30)

## 2012-02-02 LAB — POCT I-STAT TROPONIN I

## 2012-02-02 MED ORDER — RANITIDINE HCL 150 MG PO TABS: 150.0000 mg | ORAL_TABLET | Freq: Two times a day (BID) | ORAL | Status: DC

## 2012-02-02 MED ORDER — ONDANSETRON HCL 4 MG/2ML IJ SOLN
4.0000 mg | Freq: Once | INTRAMUSCULAR | Status: AC
  Administered 2012-02-02: 4 mg via INTRAVENOUS
  Filled 2012-02-02: qty 2

## 2012-02-02 MED ORDER — ASPIRIN 81 MG PO CHEW
324.0000 mg | CHEWABLE_TABLET | Freq: Once | ORAL | Status: AC
  Administered 2012-02-02: 324 mg via ORAL
  Filled 2012-02-02: qty 4

## 2012-02-02 MED ORDER — GI COCKTAIL ~~LOC~~
30.0000 mL | Freq: Once | ORAL | Status: AC
  Administered 2012-02-02: 30 mL via ORAL
  Filled 2012-02-02: qty 30

## 2012-02-02 NOTE — ED Notes (Signed)
PT reports she had chest pressure at 1AM today. Pt also reported when she checked her blood pressure the SBP was 183.

## 2012-02-02 NOTE — ED Provider Notes (Signed)
History     CSN: [id number]  Arrival date & time 02/02/12  1500   First MD Initiated Contact with Patient 02/02/12 1526      Chief Complaint  Patient presents with  . Chest Pain    (Consider location/radiation/quality/duration/timing/severity/associated sxs/prior treatment) HPI Comments: Pt seen here in oct 2013 and had an echo which was ok but denies any recent stress testing or catheterizations.  Patient is a 79 y.o. female presenting with chest pain. The history is provided by the patient.  Chest Pain The chest pain began 1 - 2 hours ago. Duration of episode(s) is 1 hour. Chest pain occurs constantly. The chest pain is resolved. At its most intense, the pain is at 6/10. The pain is currently at 0/10. The severity of the pain is moderate. The quality of the pain is described as aching, pressure-like and squeezing. Radiates to: throat and states sometimes feels it when she swallows. Primary symptoms include fatigue, nausea and dizziness. Pertinent negatives for primary symptoms include no shortness of breath, no cough, no wheezing, no abdominal pain and no vomiting.  Dizziness also occurs with nausea and weakness. Dizziness does not occur with vomiting or diaphoresis.   Associated symptoms include weakness.  Pertinent negatives for associated symptoms include no diaphoresis and no lower extremity edema. She tried nothing for the symptoms. Risk factors include being elderly.  Her past medical history is significant for hyperlipidemia and hypertension.  Pertinent negatives for past medical history include no CAD.  Procedure history is positive for stress echo.  Procedure history is negative for cardiac catheterization. Procedure history comments: stress echo 2 years ago.     Past Medical History  Diagnosis Date  . Hypertension   . Hyperlipidemia   . Tachycardia     history of tachycardia and bradycardia  . Hypothyroidism   . Dyspnea   . Diastolic dysfunction     mild   . Mild  mitral regurgitation by prior echocardiogram   . Bradycardia     hospitalized for   . Arthritis     Past Surgical History  Procedure Date  . Total abdominal hysterectomy   . Cholecystectomy   . Extracorporeal shock wave lithotripsy   . Tonsillectomy     Family History  Problem Relation Age of Onset  . Emphysema Father   . Heart failure Mother   . Heart attack Brother   . Cancer Brother     kidney  . Stroke Brother     History  Substance Use Topics  . Smoking status: Never Smoker   . Smokeless tobacco: Never Used  . Alcohol Use: No    OB History    Grav Para Term Preterm Abortions TAB SAB Ect Mult Living                  Review of Systems  Constitutional: Positive for fatigue. Negative for diaphoresis.  Respiratory: Negative for cough, shortness of breath and wheezing.   Cardiovascular: Positive for chest pain.  Gastrointestinal: Positive for nausea. Negative for vomiting and abdominal pain.  Neurological: Positive for dizziness and weakness.  All other systems reviewed and are negative.    Allergies  Review of patient's allergies indicates no known allergies.  Home Medications   Current Outpatient Rx  Name  Route  Sig  Dispense  Refill  . AMLODIPINE BESYLATE 5 MG PO TABS   Oral   Take 1 tablet (5 mg total) by mouth daily.   90 tablet   0  Pt needs appointment then refill can be made   . ASPIRIN 81 MG PO TABS   Oral   Take by mouth. 1/4 tablet daily           . VITAMIN D 1000 UNITS PO TABS   Oral   Take 1,000 Units by mouth daily.           [name] LEVOTHYROXINE SODIUM 25 MCG PO TABS   Oral   Take 25 mcg by mouth daily.           . ADULT MULTIVITAMIN W/MINERALS CH   Oral   Take 1 tablet by mouth daily.         . NEBIVOLOL HCL 5 MG PO TABS   Oral   Take 1 tablet (5 mg total) by mouth daily.   90 tablet   3   . PRAVASTATIN SODIUM 20 MG PO TABS   Oral   Take 20 mg by mouth daily.             BP 120/53  Pulse 81  Temp 98.3  F (36.8 C)  Resp 18  SpO2 96%  Physical Exam  Nursing note and vitals reviewed. Constitutional: She is oriented to person, place, and time. She appears well-developed and well-nourished. No distress.  HENT:  Head: Normocephalic and atraumatic.  Mouth/Throat: Oropharynx is clear and moist.  Eyes: Conjunctivae normal and EOM are normal. Pupils are equal, round, and reactive to light.  Neck: Normal range of motion. Neck supple.  Cardiovascular: Normal rate, regular rhythm and intact distal pulses.   Murmur heard. Pulmonary/Chest: Effort normal and breath sounds normal. No respiratory distress. She has no wheezes. She has no rales.  Abdominal: Soft. She exhibits no distension. There is tenderness. There is no rebound and no guarding.       Mild epigastric tenderness  Musculoskeletal: Normal range of motion. She exhibits no edema and no tenderness.  Neurological: She is alert and oriented to person, place, and time.  Skin: Skin is warm and dry. No rash noted. No erythema.  Psychiatric: She has a normal mood and affect. Her behavior is normal.    ED Course  Procedures (including critical care time)  Labs Reviewed  COMPREHENSIVE METABOLIC PANEL - Abnormal; Notable for the following:    Glucose, Bld 117 (*)     Total Protein 8.4 (*)     GFR calc non Af Amer 79 (*)     All other components within normal limits  CBC WITH DIFFERENTIAL  TROPONIN I  POCT I-STAT TROPONIN I   Dg Chest 2 View  02/02/2012  *RADIOLOGY REPORT*  Clinical Data: Chest pain and pressure, hypertension  CHEST - 2 VIEW  Comparison: 10/29/2011, 11/20/2009  Findings: Normal heart size and mediastinal contours. Emphysematous and bronchitic changes consistent with COPD. Chronic accentuation of right upper lobe markings stable. Minimal biapical scarring. No acute infiltrate, pleural effusion or pneumothorax. Bones demineralized.  IMPRESSION: Emphysematous and bronchitic changes consistent with COPD. Chronic right upper lobe  changes. No acute abnormalities.   Original Report Authenticated By: Ulyses Southward, M.D.      Date: 02/02/2012  Rate: 75  Rhythm: normal sinus rhythm  QRS Axis: normal  Intervals: normal  ST/T Wave abnormalities: normal  Conduction Disutrbances: none  Narrative Interpretation: unremarkable     1. Atypical chest pain   2. GERD (gastroesophageal reflux disease)       MDM   Patient presenting today with weakness, nausea and one hour of chest pain that  resolved prior to arrival. She states she's also felt a little dizzy today and her blood pressure was up at 180/90. Patient states she normally takes her blood pressure medication in the evening and is only taking Synthroid this morning. She denies having any shortness of breath, cough or fever. Her EKG is within normal limits however some features of her history are concerning for ACS. She was admitted in October of last year and had a regular echo which showed normal EF and bowel dysfunction. She states she has not had a stress test in over 2 years but denies pain worsening with eating or movement. Patient was given aspirin here. Because she is currently not having pain nitroglycerin was withheld. CBC, CMP, troponin pending.  5:10 PM Labs and cxr wnl.  Will discuss with cards.  5:48 PM Discussed patient with cardiology and they feel this is an atypical chest pain story as do I. I feel most likely that this is GERD especially since patient had an admission for this in October for the exact same symptoms. She is not hypertensive here and she's received no medications that would affect her blood pressure. Discussing with the patient I gave her the option of being hospitalized overnight for observation and getting a stress test in the morning versus doing a second troponin here and following up with Dr. Elease Hashimoto this week for outpatient stress test and she would prefer getting adult her troponin and going home.  11:29 PM Second troponin neg.  Pt  feels her normal self after GI cocktail.  States this was just like the episode she had in oct which also started at night while she was lying down.  Will start on zantac and have her f/u with cards this week for stress.  Pt given strict return precautions    Gwyneth Sprout, MD 02/02/12 2330

## 2012-02-02 NOTE — ED Notes (Signed)
Per pt sts hypertension, chest pressure, nausea and weakness that started today.

## 2012-02-03 ENCOUNTER — Telehealth: Payer: Self-pay | Admitting: Cardiovascular Disease

## 2012-02-03 NOTE — Telephone Encounter (Signed)
Dr Elease Hashimoto reviewed ED note and chart, no stress test currently needed, may make an app to discuss. Pt declines app now stating she is feeling better since starting stomach med, told her to call if app needed, pt agreed to plan.

## 2012-02-03 NOTE — Telephone Encounter (Signed)
Number rang, no voice mail set up will try later.

## 2012-02-03 NOTE — Telephone Encounter (Signed)
Pt was in hospital yesterday and was told to come get a stress test done with Dr. Melburn Popper and she was calling to set it up

## 2012-03-11 DIAGNOSIS — Z85828 Personal history of other malignant neoplasm of skin: Secondary | ICD-10-CM | POA: Diagnosis not present

## 2012-03-11 DIAGNOSIS — D239 Other benign neoplasm of skin, unspecified: Secondary | ICD-10-CM | POA: Diagnosis not present

## 2012-03-11 DIAGNOSIS — L819 Disorder of pigmentation, unspecified: Secondary | ICD-10-CM | POA: Diagnosis not present

## 2012-03-11 DIAGNOSIS — D1801 Hemangioma of skin and subcutaneous tissue: Secondary | ICD-10-CM | POA: Diagnosis not present

## 2012-03-11 DIAGNOSIS — L821 Other seborrheic keratosis: Secondary | ICD-10-CM | POA: Diagnosis not present

## 2012-03-11 DIAGNOSIS — L570 Actinic keratosis: Secondary | ICD-10-CM | POA: Diagnosis not present

## 2012-05-05 DIAGNOSIS — R141 Gas pain: Secondary | ICD-10-CM | POA: Diagnosis not present

## 2012-05-05 DIAGNOSIS — R143 Flatulence: Secondary | ICD-10-CM | POA: Diagnosis not present

## 2012-05-05 DIAGNOSIS — R82998 Other abnormal findings in urine: Secondary | ICD-10-CM | POA: Diagnosis not present

## 2012-05-14 ENCOUNTER — Other Ambulatory Visit: Payer: Self-pay | Admitting: *Deleted

## 2012-05-14 MED ORDER — AMLODIPINE BESYLATE 5 MG PO TABS: 5.0000 mg | ORAL_TABLET | Freq: Every day | ORAL | Status: DC

## 2012-05-20 ENCOUNTER — Encounter: Payer: Self-pay | Admitting: Cardiovascular Disease

## 2012-06-22 ENCOUNTER — Other Ambulatory Visit (HOSPITAL_COMMUNITY): Payer: Self-pay | Admitting: *Deleted

## 2012-06-22 MED ORDER — NEBIVOLOL HCL 5 MG PO TABS: 5.0000 mg | ORAL_TABLET | Freq: Every day | ORAL | Status: DC

## 2012-06-22 NOTE — Telephone Encounter (Signed)
Fax Received. Refill Completed. [name] (R.M.A)  NEED APPOINTMENT 

## 2012-06-30 ENCOUNTER — Other Ambulatory Visit: Payer: Self-pay | Admitting: *Deleted

## 2012-06-30 MED ORDER — AMLODIPINE BESYLATE 5 MG PO TABS: 5.0000 mg | ORAL_TABLET | Freq: Every day | ORAL | Status: DC

## 2012-06-30 NOTE — Telephone Encounter (Signed)
Fax Received. Refill Completed. [name] (R.M.A)   

## 2012-07-12 ENCOUNTER — Telehealth: Payer: Self-pay | Admitting: Cardiovascular Disease

## 2012-07-12 NOTE — Telephone Encounter (Signed)
New Problem  Pt states she is dizzy and having BP problems. She was advised by the nurse at [facility] to call here and speak with you.

## 2012-07-12 NOTE — Telephone Encounter (Signed)
Pt states she has had dizziness when laying down and turning over in bed  x several days. This morning bp was 167/102 p 88, after meds it was 146/79, p 83. Pt has no other symptoms. Advised to see pcp so she can be assessed, she agreed to plan. Told her to call if they feel cardiology needs to be involved, pt verbalized understanding.

## 2012-07-13 DIAGNOSIS — R42 Dizziness and giddiness: Secondary | ICD-10-CM | POA: Diagnosis not present

## 2012-07-13 DIAGNOSIS — F411 Generalized anxiety disorder: Secondary | ICD-10-CM | POA: Diagnosis not present

## 2012-07-19 ENCOUNTER — Telehealth: Payer: Self-pay | Admitting: Cardiovascular Disease

## 2012-07-19 NOTE — Telephone Encounter (Signed)
Pt c/o elevated bp 155/99 p86,  Pt has been seen 07/13/12 by her pcp, ekg was done along with labs. Per pcp ekg was NSR with ectopic beats. She was placed on an anti anxiety med. Pt was offered sooner app with Lawson Fiscal NP, 7/25,but declined. pt has not been seen in 1.5 years and was advised since pcp just saw her to call them and ask for advise on her bp. Pt was tearful because of family death and was feeling stressed, reassurance given and she agreed to call pcp.

## 2012-07-19 NOTE — Telephone Encounter (Signed)
New Problem  Pt states she is still experiencing dizziness from the symptoms she had in June. She also said that her BP was up.

## 2012-07-21 DIAGNOSIS — H9110 Presbycusis, unspecified ear: Secondary | ICD-10-CM | POA: Diagnosis not present

## 2012-07-21 DIAGNOSIS — R42 Dizziness and giddiness: Secondary | ICD-10-CM | POA: Diagnosis not present

## 2012-07-21 DIAGNOSIS — I10 Essential (primary) hypertension: Secondary | ICD-10-CM | POA: Diagnosis not present

## 2012-07-22 DIAGNOSIS — R011 Cardiac murmur, unspecified: Secondary | ICD-10-CM | POA: Diagnosis not present

## 2012-07-22 DIAGNOSIS — R42 Dizziness and giddiness: Secondary | ICD-10-CM | POA: Diagnosis not present

## 2012-07-22 DIAGNOSIS — I10 Essential (primary) hypertension: Secondary | ICD-10-CM | POA: Diagnosis not present

## 2012-07-22 DIAGNOSIS — I4949 Other premature depolarization: Secondary | ICD-10-CM | POA: Diagnosis not present

## 2012-07-27 ENCOUNTER — Encounter (HOSPITAL_COMMUNITY): Payer: Self-pay | Admitting: Emergency Medicine

## 2012-07-27 ENCOUNTER — Emergency Department (HOSPITAL_COMMUNITY): Payer: Medicare Other

## 2012-07-27 ENCOUNTER — Emergency Department (HOSPITAL_COMMUNITY)
Admission: EM | Admit: 2012-07-27 | Discharge: 2012-07-27 | Disposition: A | Discharge status: Home / Self Care | Payer: Medicare Other | Attending: Emergency Medicine | Admitting: Emergency Medicine

## 2012-07-27 DIAGNOSIS — Z79899 Other long term (current) drug therapy: Secondary | ICD-10-CM | POA: Diagnosis not present

## 2012-07-27 DIAGNOSIS — I10 Essential (primary) hypertension: Secondary | ICD-10-CM | POA: Diagnosis not present

## 2012-07-27 DIAGNOSIS — M129 Arthropathy, unspecified: Secondary | ICD-10-CM | POA: Insufficient documentation

## 2012-07-27 DIAGNOSIS — E039 Hypothyroidism, unspecified: Secondary | ICD-10-CM | POA: Diagnosis not present

## 2012-07-27 DIAGNOSIS — Z7982 Long term (current) use of aspirin: Secondary | ICD-10-CM | POA: Diagnosis not present

## 2012-07-27 DIAGNOSIS — Z8679 Personal history of other diseases of the circulatory system: Secondary | ICD-10-CM | POA: Diagnosis not present

## 2012-07-27 DIAGNOSIS — R42 Dizziness and giddiness: Secondary | ICD-10-CM | POA: Insufficient documentation

## 2012-07-27 DIAGNOSIS — E785 Hyperlipidemia, unspecified: Secondary | ICD-10-CM | POA: Diagnosis not present

## 2012-07-27 DIAGNOSIS — R404 Transient alteration of awareness: Secondary | ICD-10-CM | POA: Diagnosis not present

## 2012-07-27 LAB — CBC WITH DIFFERENTIAL/PLATELET
Basophils Relative: 1 % (ref 0–1)
HCT: 37.8 % (ref 36.0–46.0)
Hemoglobin: 13.2 g/dL (ref 12.0–15.0)
Lymphocytes Relative: 38 % (ref 12–46)
Lymphs Abs: 3.2 10*3/uL (ref 0.7–4.0)
MCHC: 34.9 g/dL (ref 30.0–36.0)
Monocytes Absolute: 0.5 10*3/uL (ref 0.1–1.0)
Monocytes Relative: 6 % (ref 3–12)
Neutro Abs: 4.5 10*3/uL (ref 1.7–7.7)
Neutrophils Relative %: 54 % (ref 43–77)
RBC: 4.13 MIL/uL (ref 3.87–5.11)
WBC: 8.4 10*3/uL (ref 4.0–10.5)

## 2012-07-27 LAB — URINE MICROSCOPIC-ADD ON

## 2012-07-27 LAB — COMPREHENSIVE METABOLIC PANEL
Albumin: 3.6 g/dL (ref 3.5–5.2)
Alkaline Phosphatase: 96 U/L (ref 39–117)
BUN: 12 mg/dL (ref 6–23)
CO2: 26 mEq/L (ref 19–32)
Chloride: 103 mEq/L (ref 96–112)
Creatinine, Ser: 0.71 mg/dL (ref 0.50–1.10)
GFR calc non Af Amer: 74 mL/min — ABNORMAL LOW (ref >90)
Glucose, Bld: 101 mg/dL — ABNORMAL HIGH (ref 70–99)
Potassium: 3.7 mEq/L (ref 3.5–5.1)
Total Bilirubin: 0.4 mg/dL (ref 0.3–1.2)

## 2012-07-27 LAB — URINALYSIS, ROUTINE W REFLEX MICROSCOPIC
Glucose, UA: NEGATIVE mg/dL
Protein, ur: NEGATIVE mg/dL

## 2012-07-27 LAB — TROPONIN I: Troponin I: <0.3 ng/mL (ref <0.30)

## 2012-07-27 MED ORDER — MECLIZINE HCL 25 MG PO TABS: 25.0000 mg | ORAL_TABLET | Freq: Four times a day (QID) | ORAL | Status: DC | PRN

## 2012-07-27 MED ORDER — DIAZEPAM 5 MG/ML IJ SOLN
2.5000 mg | Freq: Once | INTRAMUSCULAR | Status: AC
  Administered 2012-07-27: 2.5 mg via INTRAVENOUS
  Filled 2012-07-27: qty 2

## 2012-07-27 NOTE — ED Notes (Signed)
Pt was very dizzy standing up and needed to hold onto RN's arm and husband's arm.

## 2012-07-27 NOTE — ED Notes (Signed)
Dr. Pollina at bedside   

## 2012-07-27 NOTE — ED Notes (Signed)
Pt felt very dizzy when moving the pt from a lying position to a sitting position, pt states she felt like she was going to pass out.

## 2012-07-27 NOTE — ED Notes (Signed)
Pt ambulated in hallway with assistance of RN and husband. Pt complained of dizziness while ambulating. Pt leaned towards the left while ambulating. Pt states she felt off balanced.

## 2012-07-27 NOTE — ED Notes (Signed)
Per EMS: pt c/o dizziness increasing over the past 2 weeks. Pt is being evaluated for inner ear problems and vertigo. Ruled out inner ear problems, working on ruling out cardiac related issue. Pt has been hypertensive/had hypertensive episodes. Pt denies pain. Pt alert and oriented. Pt lives at [facility] in independent living. Pt denies change in vision. Minor nausea. No neuro deficits noted. BP 176/120. 12 lead unremarkable CBG 99.

## 2012-07-30 DIAGNOSIS — R42 Dizziness and giddiness: Secondary | ICD-10-CM | POA: Diagnosis not present

## 2012-07-30 NOTE — ED Provider Notes (Signed)
History    CSN: [id number] Arrival date & time 07/27/12  1608  First MD Initiated Contact with Patient 07/27/12 1621     Chief Complaint  Patient presents with  . Dizziness   (Consider location/radiation/quality/duration/timing/severity/associated sxs/prior Treatment) HPI Comments: Patient presents to ER for dizziness. Patient reports that the symptoms have been going on for a long time. For the last 2 weeks they have been worse. She reports a sense of spinning when she turns her head, when she stands up and moves. Patient reports that she went to an ENT doctor approximately a year ago for this and was told that it was not an ear problem. She has been noticing elevated blood pressure recently. There has not been any chest pain, palpitations, shortness of breath.  Past Medical History  Diagnosis Date  . Hypertension   . Hyperlipidemia   . Tachycardia     history of tachycardia and bradycardia  . Hypothyroidism   . Dyspnea   . Diastolic dysfunction     mild   . Mild mitral regurgitation by prior echocardiogram   . Bradycardia     hospitalized for   . Arthritis    Past Surgical History  Procedure Laterality Date  . Total abdominal hysterectomy    . Cholecystectomy    . Extracorporeal shock wave lithotripsy    . Tonsillectomy     Family History  Problem Relation Age of Onset  . Emphysema Father   . Heart failure Mother   . Heart attack Brother   . Cancer Brother     kidney  . Stroke Brother    History  Substance Use Topics  . Smoking status: Never Smoker   . Smokeless tobacco: Never Used  . Alcohol Use: No   OB History   Grav Para Term Preterm Abortions TAB SAB Ect Mult Living                 Review of Systems  Eyes: Negative for visual disturbance.  Cardiovascular: Negative.   Gastrointestinal: Negative.   Neurological: Positive for dizziness. Negative for headaches.  All other systems reviewed and are negative.    Allergies  Review of patient's  allergies indicates no known allergies.  Home Medications   Current Outpatient Rx  Name  Route  Sig  Dispense  Refill  . amLODipine (NORVASC) 5 MG tablet   Oral   Take 5 mg by mouth every evening.         [name] aspirin 81 MG tablet   Oral   Take 20.25 mg by mouth daily. Takes 1/4 of an aspirin=20.25mg          . cholecalciferol (VITAMIN D) 1000 UNITS tablet   Oral   Take 1,000 Units by mouth every evening.          . hydrOXYzine (ATARAX/VISTARIL) 25 MG tablet   Oral   Take 25 mg by mouth 3 (three) times daily as needed. For dizziness         . levothyroxine (SYNTHROID, LEVOTHROID) 25 MCG tablet   Oral   Take 25 mcg by mouth daily before breakfast.          . nebivolol (BYSTOLIC) 5 MG tablet   Oral   Take 5 mg by mouth every evening.         . pravastatin (PRAVACHOL) 20 MG tablet   Oral   Take 20 mg by mouth every evening.          . meclizine (ANTIVERT) 25  MG tablet   Oral   Take 1 tablet (25 mg total) by mouth every 6 (six) hours as needed for dizziness.   20 tablet   0    BP 117/45  Pulse 83  Temp(Src) 98.2 F (36.8 C) (Oral)  Resp 17  SpO2 96% Physical Exam  Constitutional: She is oriented to person, place, and time. She appears well-developed and well-nourished. No distress.  HENT:  Head: Normocephalic and atraumatic.  Right Ear: Hearing normal.  Left Ear: Hearing normal.  Nose: Nose normal.  Mouth/Throat: Oropharynx is clear and moist and mucous membranes are normal.  Eyes: Conjunctivae and EOM are normal. Pupils are equal, round, and reactive to light.  Neck: Normal range of motion. Neck supple.  Cardiovascular: Regular rhythm, S1 normal and S2 normal.  Exam reveals no gallop and no friction rub.   No murmur heard. Pulmonary/Chest: Effort normal and breath sounds normal. No respiratory distress. She exhibits no tenderness.  Abdominal: Soft. Normal appearance and bowel sounds are normal. There is no hepatosplenomegaly. There is no tenderness.  There is no rebound, no guarding, no tenderness at McBurney's point and negative Murphy's sign. No hernia.  Musculoskeletal: Normal range of motion.  Neurological: She is alert and oriented to person, place, and time. She has normal strength. No cranial nerve deficit or sensory deficit. Coordination normal. GCS eye subscore is 4. GCS verbal subscore is 5. GCS motor subscore is 6.  No ataxia, normal finger to nose. No pronator drift.  Skin: Skin is warm, dry and intact. No rash noted. No cyanosis.  Psychiatric: She has a normal mood and affect. Her speech is normal and behavior is normal. Thought content normal.    ED Course  Procedures (including critical care time) Labs Reviewed  COMPREHENSIVE METABOLIC PANEL - Abnormal; Notable for the following:    Glucose, Bld 101 (*)    GFR calc non Af Amer 74 (*)    GFR calc Af Amer 86 (*)    All other components within normal limits  URINALYSIS, ROUTINE W REFLEX MICROSCOPIC - Abnormal; Notable for the following:    Hgb urine dipstick SMALL (*)    Leukocytes, UA SMALL (*)    All other components within normal limits  CBC WITH DIFFERENTIAL  TROPONIN I  URINE MICROSCOPIC-ADD ON   No results found. 1. Vertigo     MDM  Patient presents to the ER for evaluation of dizziness. This has apparently been going on for some time, as she reports she saw an ENT specialist a year ago for this. She has noticed increased dizziness over the last 2 weeks as well as and blood pressure. Patient has a neurologic examination. Normal orientation, normal sensation, normal fine motor skills. No sign of cerebellar dysfunction. CT scan was normal. Remainder of the workup was unremarkable.  Patient was hypertensive at arrival. This did improve while she was here prior to any intervention. Patient given a small dose of IV Valium and blood pressure became normal. Additionally she had no dizziness at rest. She was able to ambulate without difficulty. Patient discharged to  followup with primary care physician.  Gilda Crease, MD 07/30/12 0700

## 2012-08-02 ENCOUNTER — Ambulatory Visit: Payer: Medicare Other | Admitting: Nurse Practitioner

## 2012-08-16 ENCOUNTER — Ambulatory Visit (INDEPENDENT_AMBULATORY_CARE_PROVIDER_SITE_OTHER): Payer: Medicare Other | Admitting: Nurse Practitioner

## 2012-08-16 ENCOUNTER — Encounter: Payer: Self-pay | Admitting: Nurse Practitioner

## 2012-08-16 ENCOUNTER — Encounter (INDEPENDENT_AMBULATORY_CARE_PROVIDER_SITE_OTHER): Payer: Medicare Other

## 2012-08-16 VITALS — BP 130/62 | HR 76 | Ht 64.0 in | Wt 152.8 lb

## 2012-08-16 DIAGNOSIS — I495 Sick sinus syndrome: Secondary | ICD-10-CM

## 2012-08-16 DIAGNOSIS — R42 Dizziness and giddiness: Secondary | ICD-10-CM

## 2012-08-16 DIAGNOSIS — I10 Essential (primary) hypertension: Secondary | ICD-10-CM | POA: Diagnosis not present

## 2012-08-16 NOTE — Patient Instructions (Signed)
Keep your visit with Dr. Elease Hashimoto for next month  I am going to put on a 24 hour heart monitor today  Let us know if your dizziness returns  Call the [facility] Care office at [phone] if you have any questions, problems or concerns.

## 2012-08-16 NOTE — Progress Notes (Addendum)
[name] Date of Birth: [birth year] Medical Record #[id number]  History of Present Illness: [name] is seen back today for a work in visit. Seen for Dr. Elease Hashimoto. She is 79 years of age. Has HTN. Diastolic dysfunction, AS, hypothryoidism, and tachybrady syndrome. Negative Myoview back in 2010 with EF of 75%. Echo with mild AS and LVH from October of 2013.   Has not been seen since March of 2013 - called a month ago. Had been to the ER with dizziness. BP was elevated. Given anti anxiety medicine. Endorsed more stress and anxiety due to a death in the family. Seen by her PCP with an EKG - told ok - given Atarax and Antivert.   Comes in today. Here with her husband. She is a poor historian. Says she is having "seizures" - but really describes dizziness that was exacerbated by moving her head and motion. Felt like she was going to pass out a couple of times but did not. In the ER had a negative evaluation - negative CT of the head - given IV valium. BP now ok. Has been on some anti anxiety med and Antivert - but now not taking. Says she has felt better over the past few days. Does not feel any heart racing, palpitations. Did have some sharp chest pain during the night last night - took some stomach medicine and felt better. Not very active as a general rule. Admits to being anxious.   Current Outpatient Prescriptions  Medication Sig Dispense Refill  . amLODipine (NORVASC) 5 MG tablet Take 5 mg by mouth every evening.      [name] aspirin 81 MG tablet Take 20.25 mg by mouth daily. Takes 1/4 of an aspirin=20.25mg       . cholecalciferol (VITAMIN D) 1000 UNITS tablet Take 1,000 Units by mouth every evening.       . hydrOXYzine (ATARAX/VISTARIL) 25 MG tablet Take 25 mg by mouth 3 (three) times daily as needed. For dizziness      . levothyroxine (SYNTHROID, LEVOTHROID) 25 MCG tablet Take 25 mcg by mouth daily before breakfast.       . meclizine (ANTIVERT) 25 MG tablet Take 1 tablet (25 mg total) by mouth every  6 (six) hours as needed for dizziness.  20 tablet  0  . nebivolol (BYSTOLIC) 5 MG tablet Take 5 mg by mouth every evening.      . pravastatin (PRAVACHOL) 20 MG tablet Take 20 mg by mouth every evening.       . [DISCONTINUED] Calcium Carb-Cholecalciferol (CALCIUM 1000 + D PO) Take 1,000 mg by mouth daily.        . [DISCONTINUED] escitalopram (LEXAPRO) 10 MG tablet Take 10 mg by mouth daily.         No current facility-administered medications for this visit.    No Known Allergies  Past Medical History  Diagnosis Date  . Hypertension   . Hyperlipidemia   . Tachycardia     history of tachycardia and bradycardia  . Hypothyroidism   . Dyspnea   . Diastolic dysfunction     mild   . Mild mitral regurgitation by prior echocardiogram   . Bradycardia     hospitalized for   . Arthritis     Past Surgical History  Procedure Laterality Date  . Total abdominal hysterectomy    . Cholecystectomy    . Extracorporeal shock wave lithotripsy    . Tonsillectomy      History  Smoking status  . Never  Smoker   Smokeless tobacco  . Never Used    History  Alcohol Use No    Family History  Problem Relation Age of Onset  . Emphysema Father   . Heart failure Mother   . Heart attack Brother   . Cancer Brother     kidney  . Stroke Brother     Review of Systems: The review of systems is per the HPI.  All other systems were reviewed and are negative.  Physical Exam: BP 130/62  Pulse 76  Ht 5\' 4"  (1.626 m)  Wt 152 lb 12.8 oz (69.31 kg)  BMI 26.22 kg/m2 Patient is very pleasant and in no acute distress. She is anxious. Skin is warm and dry. Color is normal.  HEENT is unremarkable. Normocephalic/atraumatic. PERRL. Sclera are nonicteric. Neck is supple. No masses. No JVD. Lungs are clear. Cardiac exam shows a regular rate and rhythm. Occasional ectopics noted. Soft outflow murmur noted. Abdomen is soft. Extremities are without edema. Gait and ROM are intact. No gross neurologic deficits  noted.  LABORATORY DATA:   Lab Results  Component Value Date   WBC 8.4 07/27/2012   HGB 13.2 07/27/2012   HCT 37.8 07/27/2012   PLT 319 07/27/2012   GLUCOSE 101* 07/27/2012   CHOL 181 10/29/2011   TRIG 118 10/29/2011   HDL 55 10/29/2011   LDLCALC 102* 10/29/2011   ALT 10 07/27/2012   AST 15 07/27/2012   NA 140 07/27/2012   K 3.7 07/27/2012   CL 103 07/27/2012   CREATININE 0.71 07/27/2012   BUN 12 07/27/2012   CO2 26 07/27/2012   TSH 2.081 10/30/2011   INR 0.90 11/18/2009   HGBA1C  Value: 6.0 (NOTE)                                                                       According to the ADA Clinical Practice Recommendations for 2011, when HbA1c is used as a screening test:   >=6.5%   Diagnostic of Diabetes Mellitus           (if abnormal result  is confirmed)  5.7-6.4%   Increased risk of developing Diabetes Mellitus  References:Diagnosis and Classification of Diabetes Mellitus,Diabetes Care,2011,34(Suppl 1):S62-S69 and Standards of Medical Care in         Diabetes - 2011,Diabetes Care,2011,34  (Suppl 1):S11-S61.* 10/02/2009   Echo Study Conclusions from October 2013  - Left ventricle: The cavity size was normal. Wall thickness was normal. Systolic function was normal. The estimated ejection fraction was in the range of 55% to 60%. - Aortic valve: There was very mild stenosis. Valve area: 1.64cm^2(VTI). Valve area: 1.64cm^2 (Vmax). - Atrial septum: No defect or patent foramen ovale was identified. - Pulmonary arteries: PA peak pressure: 39mm Hg (S).  Lab Results  Component Value Date   CKTOTAL 36 11/18/2009   CKMB 0.6 11/18/2009   TROPONINI <0.30 07/27/2012     Assessment / Plan: 1. HTN - blood pressure now looks ok.   2. Diastolic HF - preserved EF - looks compensated. Her echo was last October - I do not think we need to repeat.   3. Dizziness - this sounds like vertigo to me but she has documented tachy brady - we will  place a 24 hour Holter to get an idea of what is going on with  her rates. Does not have a PPM in place.   4. Atypical chest pain - sounds more GI - no exertional symptoms.   She has follow up with Dr. Elease Hashimoto for September. If she has more problems/symptoms she will let us know. For now, no change in her medicines.   Patient is agreeable to this plan and will call if any problems develop in the interim.   Rosalio Macadamia, RN, ANP-C [facility] [address] [place], [place]  [address]

## 2012-08-20 DIAGNOSIS — N200 Calculus of kidney: Secondary | ICD-10-CM | POA: Diagnosis not present

## 2012-08-20 DIAGNOSIS — R3129 Other microscopic hematuria: Secondary | ICD-10-CM | POA: Diagnosis not present

## 2012-08-20 DIAGNOSIS — R82998 Other abnormal findings in urine: Secondary | ICD-10-CM | POA: Diagnosis not present

## 2012-08-26 ENCOUNTER — Telehealth: Payer: Self-pay | Admitting: *Deleted

## 2012-08-26 NOTE — Telephone Encounter (Signed)
ecardio 24 hr results/ NSR with occ pvc's, pt called with results and verbalized understanding.

## 2012-09-24 ENCOUNTER — Encounter: Payer: Self-pay | Admitting: Cardiovascular Disease

## 2012-09-24 ENCOUNTER — Ambulatory Visit (INDEPENDENT_AMBULATORY_CARE_PROVIDER_SITE_OTHER): Payer: Medicare Other | Admitting: Cardiovascular Disease

## 2012-09-24 VITALS — BP 124/70 | HR 84 | Ht 64.0 in | Wt 152.0 lb

## 2012-09-24 DIAGNOSIS — R55 Syncope and collapse: Secondary | ICD-10-CM | POA: Insufficient documentation

## 2012-09-24 NOTE — Assessment & Plan Note (Signed)
Mrs. Cisek presents today for further evaluation of an episode of near syncope. These have actually occurred several times. She had a 24-hour Holter monitor which did not reveal any heart block.  She's feeling quite a bit better now.  She is currently on low-dose beta blocker and her heart rate is fairly well controlled. If the monitor shows that she has significant heart block and we will refer her for pacemaker. I think that she needs her current medications to maintain a normal heart rate and I would hesitate to stop the beta blocker empirically at this time.

## 2012-09-24 NOTE — Progress Notes (Signed)
[name] Date of Birth  [birth year] [facility] [address]    [address] [place], [place]  [address] [phone]  Fax  [phone]  Problem list: 1. Hypertension 2. Hyperlipidemia 3. Hypothyroidism  History of Present Illness:  [name] is an 79 year old female with a history of diastolic dysfunction, mild aortic stenosis and hypothyroidism. She also has a history of tachybradycardia syndrome.  She's had a stress Myoview study in May of 2010 which was normal. She has an ejection fraction of 75%. There was no ischemia.  She had an echocardiogram performed in September, 2001 which revealed normal left ventricular systolic function with moderate left ventricular hypertrophy and mild aortic stenosis.  She presents today for followup visit. She does not have any complaints. She denies any chest pain or shortness of breath.     Sept. 12, 2014:  [name] is doing well now.  She has had some dizziness and had several episodes of near syncope.   Both episodes occurred while she was sitting.  She was diagnosed with vertigo and was given meclizine.   24 hour Holter revealed NSR and 1 PVC.  We have an echocardiogram from October, 2013 which revealed normal left ventricular systolic function. She has mild aortic stenosis.   Current Outpatient Prescriptions on File Prior to Visit  Medication Sig Dispense Refill  . amLODipine (NORVASC) 5 MG tablet Take 5 mg by mouth every evening.      [name] aspirin 81 MG tablet Take 20.25 mg by mouth daily. Takes 1/4 of an aspirin=20.25mg       . cholecalciferol (VITAMIN D) 1000 UNITS tablet Take 1,000 Units by mouth every evening.       [name] levothyroxine (SYNTHROID, LEVOTHROID) 25 MCG tablet Take 25 mcg by mouth daily before breakfast.       . meclizine (ANTIVERT) 25 MG tablet Take 1 tablet (25 mg total) by mouth every 6 (six) hours as needed for dizziness.  20 tablet  0  . nebivolol (BYSTOLIC) 5 MG tablet Take 5 mg by mouth every evening.      .  pravastatin (PRAVACHOL) 20 MG tablet Take 20 mg by mouth every evening.       . [DISCONTINUED] Calcium Carb-Cholecalciferol (CALCIUM 1000 + D PO) Take 1,000 mg by mouth daily.        . [DISCONTINUED] escitalopram (LEXAPRO) 10 MG tablet Take 10 mg by mouth daily.         No current facility-administered medications on file prior to visit.    No Known Allergies  Past Medical History  Diagnosis Date  . Hypertension   . Hyperlipidemia   . Tachycardia     history of tachycardia and bradycardia  . Hypothyroidism   . Dyspnea   . Diastolic dysfunction     mild   . Mild mitral regurgitation by prior echocardiogram   . Bradycardia     hospitalized for   . Arthritis     Past Surgical History  Procedure Laterality Date  . Total abdominal hysterectomy    . Cholecystectomy    . Extracorporeal shock wave lithotripsy    . Tonsillectomy      History  Smoking status  . Never Smoker   Smokeless tobacco  . Never Used    History  Alcohol Use No    Family History  Problem Relation Age of Onset  . Emphysema Father   . Heart failure Mother   . Heart attack Brother   . Cancer Brother  kidney  . Stroke Brother     Reviw of Systems:  Reviewed in the HPI.  All other systems are negative.  Physical Exam: BP 124/70  Pulse 84  Ht 5\' 4"  (1.626 m)  Wt 152 lb (68.947 kg)  BMI 26.08 kg/m2 The patient is alert and oriented x 3.  The mood and affect are normal.   Skin: warm and dry.  Color is normal.    HEENT:   Normocephalic/atraumatic. Her carotids are normal.  Lungs: clear  Heart: Regular rate S1-S2. Chest soft 1-2/6 systolic murmur.    Abdomen: Abdomen is benign.  Extremities:  No c/c/e  Neuro:  Hard of hearing, otherwise nonfocal    ECG: 09/24/2012: Normal sinus rhythm at 84 beats a minute. The EKG is essentially normal. Assessment / Plan:

## 2012-09-24 NOTE — Patient Instructions (Addendum)
Your physician has recommended that you wear an event monitor. Event monitors are medical devices that record the heart's electrical activity. Doctors most often [place] these monitors to diagnose arrhythmias. Arrhythmias are problems with the speed or rhythm of the heartbeat. The monitor is a small, portable device. You can wear one while you do your normal daily activities. This is usually used to diagnose what is causing palpitations/syncope (passing out).  Your physician wants you to follow-up in: 6 MONTHS You will receive a reminder letter in the mail two months in advance. If you don't receive a letter, please call our office to schedule the follow-up appointment.

## 2012-09-28 DIAGNOSIS — D2310 Other benign neoplasm of skin of unspecified eyelid, including canthus: Secondary | ICD-10-CM | POA: Diagnosis not present

## 2012-09-28 DIAGNOSIS — H43819 Vitreous degeneration, unspecified eye: Secondary | ICD-10-CM | POA: Diagnosis not present

## 2012-09-28 DIAGNOSIS — H3530 Unspecified macular degeneration: Secondary | ICD-10-CM | POA: Diagnosis not present

## 2012-09-28 DIAGNOSIS — H04129 Dry eye syndrome of unspecified lacrimal gland: Secondary | ICD-10-CM | POA: Diagnosis not present

## 2012-09-29 ENCOUNTER — Encounter (INDEPENDENT_AMBULATORY_CARE_PROVIDER_SITE_OTHER): Payer: Medicare Other

## 2012-09-29 ENCOUNTER — Encounter: Payer: Self-pay | Admitting: *Deleted

## 2012-09-29 DIAGNOSIS — R55 Syncope and collapse: Secondary | ICD-10-CM | POA: Diagnosis not present

## 2012-09-29 NOTE — Progress Notes (Signed)
Patient ID: [name], female   DOB: 19-Feb-1921, 79 y.o.   MRN: [id number] E-[employer] [facility] 30 day cardiac event monitor applied to patient.

## 2012-10-05 ENCOUNTER — Other Ambulatory Visit: Payer: Self-pay

## 2012-10-05 MED ORDER — NEBIVOLOL HCL 5 MG PO TABS: 5.0000 mg | ORAL_TABLET | Freq: Every evening | ORAL | Status: DC

## 2012-10-18 DIAGNOSIS — Z23 Encounter for immunization: Secondary | ICD-10-CM | POA: Diagnosis not present

## 2012-11-01 ENCOUNTER — Other Ambulatory Visit: Payer: Self-pay

## 2012-11-01 MED ORDER — AMLODIPINE BESYLATE 5 MG PO TABS: 5.0000 mg | ORAL_TABLET | Freq: Every evening | ORAL | Status: DC

## 2012-11-01 NOTE — Telephone Encounter (Signed)
Refill sent for amlodipine.  

## 2012-11-15 ENCOUNTER — Telehealth: Payer: Self-pay | Admitting: *Deleted

## 2012-11-15 NOTE — Telephone Encounter (Signed)
msg left, monitor results ok, call with further questions.

## 2012-11-24 DIAGNOSIS — L570 Actinic keratosis: Secondary | ICD-10-CM | POA: Diagnosis not present

## 2012-11-24 DIAGNOSIS — M25579 Pain in unspecified ankle and joints of unspecified foot: Secondary | ICD-10-CM | POA: Diagnosis not present

## 2012-11-24 DIAGNOSIS — L820 Inflamed seborrheic keratosis: Secondary | ICD-10-CM | POA: Diagnosis not present

## 2012-11-24 DIAGNOSIS — D485 Neoplasm of uncertain behavior of skin: Secondary | ICD-10-CM | POA: Diagnosis not present

## 2012-12-14 DIAGNOSIS — L570 Actinic keratosis: Secondary | ICD-10-CM | POA: Diagnosis not present

## 2013-02-23 DIAGNOSIS — N200 Calculus of kidney: Secondary | ICD-10-CM | POA: Diagnosis not present

## 2013-03-23 ENCOUNTER — Encounter: Payer: Self-pay | Admitting: Cardiovascular Disease

## 2013-03-23 ENCOUNTER — Ambulatory Visit (INDEPENDENT_AMBULATORY_CARE_PROVIDER_SITE_OTHER): Payer: Medicare Other | Admitting: Cardiovascular Disease

## 2013-03-23 VITALS — BP 154/64 | HR 90 | Ht 64.0 in | Wt 156.8 lb

## 2013-03-23 DIAGNOSIS — I10 Essential (primary) hypertension: Secondary | ICD-10-CM

## 2013-03-23 NOTE — Patient Instructions (Signed)
REDUCE HIGH SODIUM FOODS LIKE CANNED SOUP, GRAVY, SAUCES, READY PREPARED FOODS LIKE FROZEN FOODS; LEAN CUISINE, LASAGNA. BACON, SAUSAGE, LUNCH MEAT, FAST FOODS, HOT DOGS, CHIPS, PIZZA, CHINESE FOOD, SOY SAUCE, STORE BOUGHT FRIED CHICKEN= [place] FRIED CHICKEN/ BO JANGLES/ V-8 JUICE.  Your physician wants you to follow-up in:6 MONTHS  You will receive a reminder letter in the mail two months in advance. If you don't receive a letter, please call our office to schedule the follow-up appointment.   Your physician recommends that you continue on your current medications as directed. Please refer to the Current Medication list given to you today.

## 2013-03-23 NOTE — Progress Notes (Addendum)
[name] Date of Birth  22-Jan-1921 [facility] [address]    [place], [place]  [address] [phone]  Fax  [phone]  Problem list: 1. Hypertension 2. Hyperlipidemia 3. Hypothyroidism  History of Present Illness:  [name] is an 80 year old female with a history of diastolic dysfunction, mild aortic stenosis and hypothyroidism. She also has a history of tachybradycardia syndrome.  She's had a stress Myoview study in May of 2010 which was normal. She has an ejection fraction of 75%. There was no ischemia.  She had an echocardiogram performed in September, 2001 which revealed normal left ventricular systolic function with moderate left ventricular hypertrophy and mild aortic stenosis.  She presents today for followup visit. She does not have any complaints. She denies any chest pain or shortness of breath.     Sept. 12, 2014:  [name] is doing well now.  She has had some dizziness and had several episodes of near syncope.   Both episodes occurred while she was sitting.  She was diagnosed with vertigo and was given meclizine.   24 hour Holter revealed NSR and 1 PVC.  We have an echocardiogram from October, 2013 which revealed normal left ventricular systolic function. She has mild aortic stenosis.  March 23, 2013:  She was last seen in Sept. 2014.  Her BP has been a bit more variable.   Her blood pressure varies between 150s to 120s. She does not eat any extra salt but she eats at the regular food bar at [facility]  - her husband eats at the heart healthy food bar.    Current Outpatient Prescriptions on File Prior to Visit  Medication Sig Dispense Refill  . amLODipine (NORVASC) 5 MG tablet Take 1 tablet (5 mg total) by mouth every evening.  90 tablet  3  . aspirin 81 MG tablet Take 20.25 mg by mouth daily. Takes 1/4 of an aspirin=20.25mg       . cholecalciferol (VITAMIN D) 1000 UNITS tablet Take 1,000 Units by mouth every evening.        [name] levothyroxine (SYNTHROID, LEVOTHROID) 25 MCG tablet Take 25 mcg by mouth daily before breakfast.       . meclizine (ANTIVERT) 25 MG tablet Take 1 tablet (25 mg total) by mouth every 6 (six) hours as needed for dizziness.  20 tablet  0  . nebivolol (BYSTOLIC) 5 MG tablet Take 1 tablet (5 mg total) by mouth every evening.  30 tablet  6  . pravastatin (PRAVACHOL) 20 MG tablet Take 20 mg by mouth every evening.       . [DISCONTINUED] Calcium Carb-Cholecalciferol (CALCIUM 1000 + D PO) Take 1,000 mg by mouth daily.        . [DISCONTINUED] escitalopram (LEXAPRO) 10 MG tablet Take 10 mg by mouth daily.         No current facility-administered medications on file prior to visit.    No Known Allergies  Past Medical History  Diagnosis Date  . Hypertension   . Hyperlipidemia   . Tachycardia     history of tachycardia and bradycardia  . Hypothyroidism   . Dyspnea   . Diastolic dysfunction     mild   . Mild mitral regurgitation by prior echocardiogram   . Bradycardia     hospitalized for   . Arthritis     Past Surgical History  Procedure Laterality Date  . Total abdominal hysterectomy    . Cholecystectomy    .  Extracorporeal shock wave lithotripsy    . Tonsillectomy      History  Smoking status  . Never Smoker   Smokeless tobacco  . Never Used    History  Alcohol Use No    Family History  Problem Relation Age of Onset  . Emphysema Father   . Heart failure Mother   . Heart attack Brother   . Cancer Brother     kidney  . Stroke Brother     Reviw of Systems:  Reviewed in the HPI.  All other systems are negative.  Physical Exam: BP 154/64  Pulse 90  Ht 5\' 4"  (1.626 m)  Wt 156 lb 12.8 oz (71.124 kg)  BMI 26.90 kg/m2 The patient is alert and oriented x 3.  The mood and affect are normal.   Skin: warm and dry.  Color is normal.    HEENT:   Normocephalic/atraumatic. Her carotids are normal.  Lungs: clear  Heart: Regular rate S1-S2. Chest soft 3-7/0 systolic  murmur.    Abdomen: Abdomen is benign.  Extremities:  No c/c/e  Neuro:  Hard of hearing, otherwise nonfocal    ECG: 09/24/2012: Normal sinus rhythm at 84 beats a minute. The EKG is essentially normal. Assessment / Plan:

## 2013-03-23 NOTE — Assessment & Plan Note (Addendum)
[name] presents today complaining of variable blood pressures. She insisted that she wasn't eating any salty foods. Her husband commented that she ate at the regular food bar at her assisted living. He eats at a heart healthy food bar. I suggest to her that she probably was getting a lot more salt  than she knew of.  I've recommended that she start eating with her husband at the healthy food bar. We'll continue with her current medications. I'll see her in 6 months for followup visit.  I've asked her to write her BP on  A legal pad and bring to her next ov.

## 2013-03-31 DIAGNOSIS — F411 Generalized anxiety disorder: Secondary | ICD-10-CM | POA: Diagnosis not present

## 2013-03-31 DIAGNOSIS — E039 Hypothyroidism, unspecified: Secondary | ICD-10-CM | POA: Diagnosis not present

## 2013-03-31 DIAGNOSIS — E7800 Pure hypercholesterolemia, unspecified: Secondary | ICD-10-CM | POA: Diagnosis not present

## 2013-05-11 DIAGNOSIS — L819 Disorder of pigmentation, unspecified: Secondary | ICD-10-CM | POA: Diagnosis not present

## 2013-05-11 DIAGNOSIS — L2089 Other atopic dermatitis: Secondary | ICD-10-CM | POA: Diagnosis not present

## 2013-05-11 DIAGNOSIS — L738 Other specified follicular disorders: Secondary | ICD-10-CM | POA: Diagnosis not present

## 2013-05-11 DIAGNOSIS — L821 Other seborrheic keratosis: Secondary | ICD-10-CM | POA: Diagnosis not present

## 2013-05-11 DIAGNOSIS — D239 Other benign neoplasm of skin, unspecified: Secondary | ICD-10-CM | POA: Diagnosis not present

## 2013-05-11 DIAGNOSIS — Z85828 Personal history of other malignant neoplasm of skin: Secondary | ICD-10-CM | POA: Diagnosis not present

## 2013-05-11 DIAGNOSIS — D1801 Hemangioma of skin and subcutaneous tissue: Secondary | ICD-10-CM | POA: Diagnosis not present

## 2013-05-17 ENCOUNTER — Other Ambulatory Visit: Payer: Self-pay | Admitting: *Deleted

## 2013-05-17 MED ORDER — NEBIVOLOL HCL 5 MG PO TABS: 5.0000 mg | ORAL_TABLET | Freq: Every evening | ORAL | Status: DC

## 2013-08-01 DIAGNOSIS — I359 Nonrheumatic aortic valve disorder, unspecified: Secondary | ICD-10-CM | POA: Diagnosis not present

## 2013-08-01 DIAGNOSIS — I10 Essential (primary) hypertension: Secondary | ICD-10-CM | POA: Diagnosis not present

## 2013-08-01 DIAGNOSIS — F411 Generalized anxiety disorder: Secondary | ICD-10-CM | POA: Diagnosis not present

## 2013-08-01 DIAGNOSIS — Z1331 Encounter for screening for depression: Secondary | ICD-10-CM | POA: Diagnosis not present

## 2013-08-01 DIAGNOSIS — Z23 Encounter for immunization: Secondary | ICD-10-CM | POA: Diagnosis not present

## 2013-09-02 ENCOUNTER — Encounter (HOSPITAL_COMMUNITY): Payer: Self-pay | Admitting: Emergency Medicine

## 2013-09-02 ENCOUNTER — Emergency Department (HOSPITAL_COMMUNITY): Payer: Medicare Other

## 2013-09-02 ENCOUNTER — Observation Stay (HOSPITAL_COMMUNITY)
Admission: EM | Admit: 2013-09-02 | Discharge: 2013-09-03 | Disposition: A | Discharge status: Home / Self Care | Payer: Medicare Other | Attending: Internal Medicine | Admitting: Internal Medicine

## 2013-09-02 DIAGNOSIS — E039 Hypothyroidism, unspecified: Secondary | ICD-10-CM | POA: Insufficient documentation

## 2013-09-02 DIAGNOSIS — R072 Precordial pain: Secondary | ICD-10-CM | POA: Diagnosis not present

## 2013-09-02 DIAGNOSIS — R110 Nausea: Secondary | ICD-10-CM | POA: Insufficient documentation

## 2013-09-02 DIAGNOSIS — I519 Heart disease, unspecified: Secondary | ICD-10-CM

## 2013-09-02 DIAGNOSIS — R0609 Other forms of dyspnea: Secondary | ICD-10-CM | POA: Insufficient documentation

## 2013-09-02 DIAGNOSIS — Z7982 Long term (current) use of aspirin: Secondary | ICD-10-CM | POA: Diagnosis not present

## 2013-09-02 DIAGNOSIS — I10 Essential (primary) hypertension: Secondary | ICD-10-CM

## 2013-09-02 DIAGNOSIS — I498 Other specified cardiac arrhythmias: Secondary | ICD-10-CM | POA: Insufficient documentation

## 2013-09-02 DIAGNOSIS — M129 Arthropathy, unspecified: Secondary | ICD-10-CM | POA: Insufficient documentation

## 2013-09-02 DIAGNOSIS — R0602 Shortness of breath: Secondary | ICD-10-CM | POA: Diagnosis not present

## 2013-09-02 DIAGNOSIS — E785 Hyperlipidemia, unspecified: Secondary | ICD-10-CM | POA: Diagnosis not present

## 2013-09-02 DIAGNOSIS — R000 Tachycardia, unspecified: Secondary | ICD-10-CM | POA: Insufficient documentation

## 2013-09-02 DIAGNOSIS — R079 Chest pain, unspecified: Secondary | ICD-10-CM | POA: Diagnosis not present

## 2013-09-02 DIAGNOSIS — R0989 Other specified symptoms and signs involving the circulatory and respiratory systems: Secondary | ICD-10-CM | POA: Diagnosis not present

## 2013-09-02 DIAGNOSIS — I5189 Other ill-defined heart diseases: Secondary | ICD-10-CM

## 2013-09-02 DIAGNOSIS — Z79899 Other long term (current) drug therapy: Secondary | ICD-10-CM | POA: Insufficient documentation

## 2013-09-02 DIAGNOSIS — I059 Rheumatic mitral valve disease, unspecified: Secondary | ICD-10-CM | POA: Insufficient documentation

## 2013-09-02 LAB — CBC
HCT: 41.5 % (ref 36.0–46.0)
HEMATOCRIT: 39.8 % (ref 36.0–46.0)
HEMOGLOBIN: 13.7 g/dL (ref 12.0–15.0)
HEMOGLOBIN: 13.3 g/dL (ref 12.0–15.0)
MCH: 31.4 pg (ref 26.0–34.0)
MCH: 31.4 pg (ref 26.0–34.0)
MCHC: 33 g/dL (ref 30.0–36.0)
MCHC: 33.4 g/dL (ref 30.0–36.0)
MCV: 95.2 fL (ref 78.0–100.0)
MCV: 94.1 fL (ref 78.0–100.0)
Platelets: 338 10*3/uL (ref 150–400)
Platelets: 295 10*3/uL (ref 150–400)
RBC: 4.36 MIL/uL (ref 3.87–5.11)
RBC: 4.23 MIL/uL (ref 3.87–5.11)
RDW: 13.9 % (ref 11.5–15.5)
RDW: 14 % (ref 11.5–15.5)
WBC: 10.8 10*3/uL — ABNORMAL HIGH (ref 4.0–10.5)
WBC: 10.3 10*3/uL (ref 4.0–10.5)

## 2013-09-02 LAB — I-STAT TROPONIN, ED: Troponin i, poc: 0 ng/mL (ref 0.00–0.08)

## 2013-09-02 LAB — PRO B NATRIURETIC PEPTIDE: PRO B NATRI PEPTIDE: 149.9 pg/mL (ref 0–450)

## 2013-09-02 LAB — CREATININE, SERUM
CREATININE: 0.58 mg/dL (ref 0.50–1.10)
GFR calc Af Amer: >90 mL/min (ref >90)
GFR, EST NON AFRICAN AMERICAN: 79 mL/min — AB (ref >90)

## 2013-09-02 LAB — TROPONIN I

## 2013-09-02 LAB — BASIC METABOLIC PANEL
ANION GAP: 14 (ref 5–15)
BUN: 12 mg/dL (ref 6–23)
CALCIUM: 9.5 mg/dL (ref 8.4–10.5)
CHLORIDE: 104 meq/L (ref 96–112)
CO2: 24 meq/L (ref 19–32)
CREATININE: 0.66 mg/dL (ref 0.50–1.10)
GFR calc Af Amer: 87 mL/min — ABNORMAL LOW (ref >90)
GFR calc non Af Amer: 75 mL/min — ABNORMAL LOW (ref >90)
Glucose, Bld: 119 mg/dL — ABNORMAL HIGH (ref 70–99)
Potassium: 4.5 mEq/L (ref 3.7–5.3)
Sodium: 142 mEq/L (ref 137–147)

## 2013-09-02 MED ORDER — NITROGLYCERIN 0.4 MG SL SUBL: 0.4000 mg | SUBLINGUAL_TABLET | SUBLINGUAL | Status: DC | PRN

## 2013-09-02 MED ORDER — ADULT MULTIVITAMIN W/MINERALS CH
1.0000 | ORAL_TABLET | Freq: Every day | ORAL | Status: DC
  Filled 2013-09-02: qty 1

## 2013-09-02 MED ORDER — NEBIVOLOL HCL 5 MG PO TABS
5.0000 mg | ORAL_TABLET | Freq: Every evening | ORAL | Status: DC
  Administered 2013-09-02: 5 mg via ORAL
  Filled 2013-09-02 (×2): qty 1

## 2013-09-02 MED ORDER — ONDANSETRON HCL 4 MG/2ML IJ SOLN: 4.0000 mg | Freq: Four times a day (QID) | INTRAMUSCULAR | Status: DC | PRN

## 2013-09-02 MED ORDER — SODIUM CHLORIDE 0.9 % IJ SOLN
3.0000 mL | Freq: Two times a day (BID) | INTRAMUSCULAR | Status: DC
  Administered 2013-09-02 – 2013-09-03 (×2): 3 mL via INTRAVENOUS

## 2013-09-02 MED ORDER — SERTRALINE HCL 25 MG PO TABS
25.0000 mg | ORAL_TABLET | Freq: Every morning | ORAL | Status: DC
  Administered 2013-09-03: 25 mg via ORAL
  Filled 2013-09-02: qty 1

## 2013-09-02 MED ORDER — ACETAMINOPHEN 650 MG RE SUPP: 650.0000 mg | Freq: Four times a day (QID) | RECTAL | Status: DC | PRN

## 2013-09-02 MED ORDER — LEVOTHYROXINE SODIUM 25 MCG PO TABS
25.0000 ug | ORAL_TABLET | Freq: Every day | ORAL | Status: DC
  Filled 2013-09-02 (×2): qty 1

## 2013-09-02 MED ORDER — SIMVASTATIN 20 MG PO TABS
20.0000 mg | ORAL_TABLET | Freq: Every day | ORAL | Status: DC
  Administered 2013-09-02: 20 mg via ORAL
  Filled 2013-09-02 (×2): qty 1

## 2013-09-02 MED ORDER — ACETAMINOPHEN 325 MG PO TABS: 650.0000 mg | ORAL_TABLET | Freq: Four times a day (QID) | ORAL | Status: DC | PRN

## 2013-09-02 MED ORDER — ONDANSETRON HCL 4 MG PO TABS: 4.0000 mg | ORAL_TABLET | Freq: Four times a day (QID) | ORAL | Status: DC | PRN

## 2013-09-02 MED ORDER — AMLODIPINE BESYLATE 5 MG PO TABS
5.0000 mg | ORAL_TABLET | Freq: Every evening | ORAL | Status: DC
  Administered 2013-09-02: 5 mg via ORAL
  Filled 2013-09-02 (×2): qty 1

## 2013-09-02 MED ORDER — ASPIRIN 325 MG PO TABS
325.0000 mg | ORAL_TABLET | ORAL | Status: AC
  Administered 2013-09-02: 325 mg via ORAL
  Filled 2013-09-02: qty 1

## 2013-09-02 MED ORDER — ADULT MULTIVITAMIN W/MINERALS CH: 1.0000 | ORAL_TABLET | Freq: Every day | ORAL | Status: DC

## 2013-09-02 MED ORDER — HEPARIN SODIUM (PORCINE) 5000 UNIT/ML IJ SOLN
5000.0000 [IU] | Freq: Three times a day (TID) | INTRAMUSCULAR | Status: DC
  Administered 2013-09-02 – 2013-09-03 (×3): 5000 [IU] via SUBCUTANEOUS
  Filled 2013-09-02 (×6): qty 1

## 2013-09-02 MED ORDER — VITAMIN D3 25 MCG (1000 UNIT) PO TABS
1000.0000 [IU] | ORAL_TABLET | Freq: Every evening | ORAL | Status: DC
  Administered 2013-09-02: 1000 [IU] via ORAL
  Filled 2013-09-02 (×2): qty 1

## 2013-09-02 MED ORDER — ALPRAZOLAM 0.25 MG PO TABS: 0.1250 mg | ORAL_TABLET | Freq: Every day | ORAL | Status: DC | PRN

## 2013-09-02 MED ORDER — MORPHINE SULFATE 2 MG/ML IJ SOLN: 2.0000 mg | INTRAMUSCULAR | Status: DC | PRN

## 2013-09-02 MED ORDER — ASPIRIN 81 MG PO CHEW: 81.0000 mg | CHEWABLE_TABLET | Freq: Every day | ORAL | Status: DC

## 2013-09-02 NOTE — ED Notes (Signed)
Pt states she is pain free 

## 2013-09-02 NOTE — ED Notes (Signed)
Attempted to call report

## 2013-09-02 NOTE — ED Notes (Signed)
Pt here with "uncomfortable feeling" in chest that began at 8:30 am.  SLight sob with this. No n/v or diaphoresis.  Pt denies any radiation with this pain.

## 2013-09-02 NOTE — H&P (Signed)
[facility] History and Physical  [name]:[id number] DOB: November 19, 1921 DOA: 09/02/2013  Referring physician: Emergency Department PCP: [name], MD  Specialists:   Chief Complaint: Chest Pain  HPI: [name] is a 80 y.o. female  With a hx of htn, hld who presents to the ED with chest pains on exertion associated with dyspnea, nausea, and diaphoresis. Pt has a family hx of heart disease. Pt's pain lasted 3hrs before eventually self-resolving. In the Ed, pt was pain free. EKG was unremarkable. Given pt's risk factors and presenting symptoms, hospitalist service consulted for admission.  Review of Systems: Per above, the remainder of the 10pt ros reviewed and are neg  Past Medical History  Diagnosis Date  . Hypertension   . Hyperlipidemia   . Tachycardia     history of tachycardia and bradycardia  . Hypothyroidism   . Dyspnea   . Diastolic dysfunction     mild   . Mild mitral regurgitation by prior echocardiogram   . Bradycardia     hospitalized for   . Arthritis    Past Surgical History  Procedure Laterality Date  . Total abdominal hysterectomy    . Cholecystectomy    . Extracorporeal shock wave lithotripsy    . Tonsillectomy     Social History:  reports that she has never smoked. She has never used smokeless tobacco. She reports that she does not drink alcohol or use illicit drugs.  where does patient live--home, ALF, SNF? and with whom if at home?  Can patient participate in ADLs?  No Known Allergies  Family History  Problem Relation Age of Onset  . Emphysema Father   . Heart failure Mother   . Heart attack Brother   . Cancer Brother     kidney  . Stroke Brother     (be sure to complete)  Prior to Admission medications   Medication Sig Start Date End Date Taking? Authorizing Provider  amLODipine (NORVASC) 5 MG tablet Take 1 tablet (5 mg total) by mouth every evening. 11/01/12   Thayer Headings, MD  aspirin 81 MG tablet Take 20.25  mg by mouth daily. Takes 1/4 of an aspirin=20.25mg     Historical Provider, MD  cholecalciferol (VITAMIN D) 1000 UNITS tablet Take 1,000 Units by mouth every evening.     Historical Provider, MD  levothyroxine (SYNTHROID, LEVOTHROID) 25 MCG tablet Take 25 mcg by mouth daily before breakfast.     Historical Provider, MD  meclizine (ANTIVERT) 25 MG tablet Take 1 tablet (25 mg total) by mouth every 6 (six) hours as needed for dizziness. 07/27/12   Orpah Greek, MD  Multiple Vitamins-Minerals (PRESERVISION AREDS 2 PO) Take 1 tablet by mouth 2 (two) times daily.    Historical Provider, MD  nebivolol (BYSTOLIC) 5 MG tablet Take 1 tablet (5 mg total) by mouth every evening. 05/17/13   Thayer Headings, MD  pravastatin (PRAVACHOL) 20 MG tablet Take 20 mg by mouth every evening.     Historical Provider, MD  sertraline (ZOLOFT) 25 MG tablet  08/15/13   Historical Provider, MD   Physical Exam: Filed Vitals:   09/02/13 1227 09/02/13 1330 09/02/13 1400  BP: 156/58 149/96 136/54  Pulse: 58 72 60  Temp: 97.6 F (36.4 C)    TempSrc: Oral    Resp: 16 15 17   SpO2: 98% 96% 95%     General:  Awake, in nad  Eyes: PERRL B  ENT: membranes moist, dentition fair  Neck: trachea midline,  neck supple  Cardiovascular: regular, s1, s2  Respiratory: normal resp effort, no wheezing  Abdomen: soft,nondistended  Skin: perfused, no clubbing  Musculoskeletal: perfused, no clubbing  Psychiatric: mood/affect normal// no auditory/visual hallucinations  Neurologic: cn2-12 grossly intact, strength/sensation intact  Labs on Admission:  Basic Metabolic Panel:  Recent Labs Lab 09/02/13 1227  NA 142  K 4.5  CL 104  CO2 24  GLUCOSE 119*  BUN 12  CREATININE 0.66  CALCIUM 9.5   Liver Function Tests: No results found for this basename: AST, ALT, ALKPHOS, BILITOT, PROT, ALBUMIN,  in the last 168 hours No results found for this basename: LIPASE, AMYLASE,  in the last 168 hours No results found for this  basename: AMMONIA,  in the last 168 hours CBC:  Recent Labs Lab 09/02/13 1227  WBC 10.8*  HGB 13.7  HCT 41.5  MCV 95.2  PLT 338   Cardiac Enzymes: No results found for this basename: CKTOTAL, CKMB, CKMBINDEX, TROPONINI,  in the last 168 hours  BNP (last 3 results)  Recent Labs  09/02/13 1227  PROBNP 149.9   CBG: No results found for this basename: GLUCAP,  in the last 168 hours  Radiological Exams on Admission: Dg Chest Port 1 View  09/02/2013   CLINICAL DATA:  Chest pain  EXAM: PORTABLE CHEST - 1 VIEW  COMPARISON:  February 02, 2012  FINDINGS: There is no edema or consolidation. Heart size and pulmonary vascularity are normal. No adenopathy. No pneumothorax. No bone lesions.  IMPRESSION: No edema or consolidation.   Electronically Signed   By: Lowella Grip M.D.   On: 09/02/2013 13:55    EKG: Independently reviewed. NSR  Assessment/Plan Active Problems:   DYSPNEA ON EXERTION   HTN (hypertension)   Chest pain   1. Chest Pain 1. Will admit to tele/obs 2. Will follow serial trop 3. Will obtain 2d echo to r/o WMA 4. Cont with morphine and nitro as needed 5. Will repeat ekg in AM 2. HTN 1. Stable 2. Cont home meds for now 3. HLD 1. Will check fasting lipids in AM 2. Continue statin 4. DVT prophylaxis 1. Heparin subq  Code Status: Full (must indicate code status--if unknown or must be presumed, indicate so) Family Communication: Pt in room (indicate person spoken with, if applicable, with phone number if by telephone) Disposition Plan: Pending (indicate anticipated LOS)  Time spent: 31min  Seairra Otani, [facility] Pager [phone]  If 7PM-7AM, please contact night-coverage [email or web address] Password [email or web address] 09/02/2013, 2:40 PM

## 2013-09-02 NOTE — ED Provider Notes (Signed)
CSN: [id number]     Arrival date & time 09/02/13  1220 History   First MD Initiated Contact with Patient 09/02/13 1320     Chief Complaint  Patient presents with  . Chest Pain      HPI Pt was seen at 1330. Per pt and her spouse, c/o gradual onset and resolution of one episode of chest "pain" that occurred today approximately 0830 this morning. Pt states she was walking up a flight of steps when she developed mid-sternal chest "tightness" and "heaviness." CP was associated with SOB and nausea.  States her symptoms lasted approximately 3 hours before resolving after resting. Denies vomiting, no abd pain, no back pain, no fevers, no palpitations, no cough.    Past Medical History  Diagnosis Date  . Hypertension   . Hyperlipidemia   . Tachycardia     history of tachycardia and bradycardia  . Hypothyroidism   . Dyspnea   . Diastolic dysfunction     mild   . Mild mitral regurgitation by prior echocardiogram   . Bradycardia     hospitalized for   . Arthritis    Past Surgical History  Procedure Laterality Date  . Total abdominal hysterectomy    . Cholecystectomy    . Extracorporeal shock wave lithotripsy    . Tonsillectomy     Family History  Problem Relation Age of Onset  . Emphysema Father   . Heart failure Mother   . Heart attack Brother   . Cancer Brother     kidney  . Stroke Brother    History  Substance Use Topics  . Smoking status: Never Smoker   . Smokeless tobacco: Never Used  . Alcohol Use: No    Review of Systems ROS: Statement: All systems negative except as marked or noted in the HPI; Constitutional: Negative for fever and chills. ; ; Eyes: Negative for eye pain, redness and discharge. ; ; ENMT: Negative for ear pain, hoarseness, nasal congestion, sinus pressure and sore throat. ; ; Cardiovascular: +CP, SOB. Negative for palpitations, diaphoresis, and peripheral edema. ; ; Respiratory: Negative for cough, wheezing and stridor. ; ; Gastrointestinal: +nausea.  Negative for vomiting, diarrhea, abdominal pain, blood in stool, hematemesis, jaundice and rectal bleeding. . ; ; Genitourinary: Negative for dysuria, flank pain and hematuria. ; ; Musculoskeletal: Negative for back pain and neck pain. Negative for swelling and trauma.; ; Skin: Negative for pruritus, rash, abrasions, blisters, bruising and skin lesion.; ; Neuro: Negative for headache, lightheadedness and neck stiffness. Negative for weakness, altered level of consciousness , altered mental status, extremity weakness, paresthesias, involuntary movement, seizure and syncope.     Allergies  Review of patient's allergies indicates no known allergies.  Home Medications   Prior to Admission medications   Medication Sig Start Date End Date Taking? Authorizing Provider  ALPRAZolam (XANAX) 0.25 MG tablet Take 0.125 mg by mouth daily as needed for anxiety.    Yes Historical Provider, MD  amLODipine (NORVASC) 5 MG tablet Take 1 tablet (5 mg total) by mouth every evening. 11/01/12  Yes Thayer Headings, MD  aspirin 81 MG tablet Take 20.25 mg by mouth daily. Takes 1/4 of an aspirin=20.25mg    Yes Historical Provider, MD  cholecalciferol (VITAMIN D) 1000 UNITS tablet Take 1,000 Units by mouth every evening.    Yes Historical Provider, MD  levothyroxine (SYNTHROID, LEVOTHROID) 25 MCG tablet Take 25 mcg by mouth daily before breakfast.    Yes Historical Provider, MD  Multiple Vitamin (MULTIVITAMIN WITH MINERALS)  TABS tablet Take 1 tablet by mouth daily.   Yes Historical Provider, MD  Multiple Vitamins-Minerals (PRESERVISION AREDS) CAPS Take 1 capsule by mouth 2 (two) times daily.   Yes Historical Provider, MD  nebivolol (BYSTOLIC) 5 MG tablet Take 1 tablet (5 mg total) by mouth every evening. 05/17/13  Yes Thayer Headings, MD  pravastatin (PRAVACHOL) 20 MG tablet Take 20 mg by mouth every evening.    Yes Historical Provider, MD  sertraline (ZOLOFT) 25 MG tablet Take 25 mg by mouth every morning.  08/15/13  Yes  Historical Provider, MD   BP 150/57  Pulse 75  Temp(Src) 97.6 F (36.4 C) (Oral)  Resp 13  SpO2 96% Physical Exam 1335: Physical examination:  Nursing notes reviewed; Vital signs and O2 SAT reviewed;  Constitutional: Well developed, Well nourished, Well hydrated, In no acute distress; Head:  Normocephalic, atraumatic; Eyes: EOMI, PERRL, No scleral icterus; ENMT: Mouth and pharynx normal, Mucous membranes moist; Neck: Supple, Full range of motion, No lymphadenopathy; Cardiovascular: Regular rate and rhythm, No gallop; Respiratory: Breath sounds clear & equal bilaterally, No wheezes.  Speaking full sentences with ease, Normal respiratory effort/excursion; Chest: Nontender, Movement normal; Abdomen: Soft, Nontender, Nondistended, Normal bowel sounds; Genitourinary: No CVA tenderness; Extremities: Pulses normal, No tenderness, No edema, No calf edema or asymmetry.; Neuro: AA&Ox3, Major CN grossly intact.  Speech clear. No gross focal motor or sensory deficits in extremities.; Skin: Color normal, Warm, Dry.   ED Course  Procedures     EKG Interpretation   Date/Time:  Friday September 02 2013 12:22:27 EDT Ventricular Rate:  82 PR Interval:  172 QRS Duration: 88 QT Interval:  388 QTC Calculation: 453 R Axis:   59 Text Interpretation:  Sinus rhythm with occasional Premature ventricular  complexes Otherwise normal ECG When compared with ECG of 07/27/2012 No  significant change was found Confirmed by [facility]  MD, Nunzio Cory [phone])  on 09/02/2013 1:22:17 PM      MDM  MDM Reviewed: previous chart, nursing note and vitals Reviewed previous: labs and ECG Interpretation: labs, ECG and x-ray     Results for orders placed during the hospital encounter of 09/02/13  CBC      Result Value Ref Range   WBC 10.8 (*) 4.0 - 10.5 K/uL   RBC 4.36  3.87 - 5.11 MIL/uL   Hemoglobin 13.7  12.0 - 15.0 g/dL   HCT 41.5  36.0 - 46.0 %   MCV 95.2  78.0 - 100.0 fL   MCH 31.4  26.0 - 34.0 pg   MCHC 33.0   30.0 - 36.0 g/dL   RDW 13.9  11.5 - 15.5 %   Platelets 338  150 - 400 K/uL  PRO B NATRIURETIC PEPTIDE      Result Value Ref Range   Pro B Natriuretic peptide (BNP) 149.9  0 - 450 pg/mL  BASIC METABOLIC PANEL      Result Value Ref Range   Sodium 142  137 - 147 mEq/L   Potassium 4.5  3.7 - 5.3 mEq/L   Chloride 104  96 - 112 mEq/L   CO2 24  19 - 32 mEq/L   Glucose, Bld 119 (*) 70 - 99 mg/dL   BUN 12  6 - 23 mg/dL   Creatinine, Ser 0.66  0.50 - 1.10 mg/dL   Calcium 9.5  8.4 - 10.5 mg/dL   GFR calc non Af Amer 75 (*) >90 mL/min   GFR calc Af Amer 87 (*) >90 mL/min   Anion  gap 14  5 - 15  I-STAT TROPOININ, ED      Result Value Ref Range   Troponin i, poc 0.00  0.00 - 0.08 ng/mL   Comment 3            Dg Chest Port 1 View 09/02/2013   CLINICAL DATA:  Chest pain  EXAM: PORTABLE CHEST - 1 VIEW  COMPARISON:  February 02, 2012  FINDINGS: There is no edema or consolidation. Heart size and pulmonary vascularity are normal. No adenopathy. No pneumothorax. No bone lesions.  IMPRESSION: No edema or consolidation.   Electronically Signed   By: Lowella Grip M.D.   On: 09/02/2013 13:55    1430:  Pt continues to deny symptoms while in the ED. Dx and testing d/w pt and family.  Questions answered.  Verb understanding, agreeable to observation admit. T/C to [facility]. Wyline Copas, case discussed, including:  HPI, pertinent PM/SHx, VS/PE, dx testing, ED course and treatment:  Agreeable to admit, requests to write temporary orders, obtain observation tele bed to team 10.    Francine Graven, DO 09/03/13 [phone]

## 2013-09-03 DIAGNOSIS — E785 Hyperlipidemia, unspecified: Secondary | ICD-10-CM | POA: Diagnosis not present

## 2013-09-03 DIAGNOSIS — I10 Essential (primary) hypertension: Secondary | ICD-10-CM | POA: Diagnosis not present

## 2013-09-03 DIAGNOSIS — R0609 Other forms of dyspnea: Secondary | ICD-10-CM | POA: Diagnosis not present

## 2013-09-03 DIAGNOSIS — R0989 Other specified symptoms and signs involving the circulatory and respiratory systems: Secondary | ICD-10-CM | POA: Diagnosis not present

## 2013-09-03 DIAGNOSIS — I359 Nonrheumatic aortic valve disorder, unspecified: Secondary | ICD-10-CM | POA: Diagnosis not present

## 2013-09-03 DIAGNOSIS — R072 Precordial pain: Secondary | ICD-10-CM

## 2013-09-03 DIAGNOSIS — R079 Chest pain, unspecified: Secondary | ICD-10-CM | POA: Diagnosis not present

## 2013-09-03 DIAGNOSIS — R000 Tachycardia, unspecified: Secondary | ICD-10-CM | POA: Diagnosis not present

## 2013-09-03 LAB — CBC
HCT: 36 % (ref 36.0–46.0)
Hemoglobin: 11.8 g/dL — ABNORMAL LOW (ref 12.0–15.0)
MCH: 30.2 pg (ref 26.0–34.0)
MCHC: 32.8 g/dL (ref 30.0–36.0)
MCV: 92.1 fL (ref 78.0–100.0)
PLATELETS: 294 10*3/uL (ref 150–400)
RBC: 3.91 MIL/uL (ref 3.87–5.11)
RDW: 14.1 % (ref 11.5–15.5)
WBC: 6.9 10*3/uL (ref 4.0–10.5)

## 2013-09-03 LAB — COMPREHENSIVE METABOLIC PANEL
ALT: 10 U/L (ref 0–35)
ANION GAP: 15 (ref 5–15)
AST: 14 U/L (ref 0–37)
Albumin: 3.4 g/dL — ABNORMAL LOW (ref 3.5–5.2)
Alkaline Phosphatase: 81 U/L (ref 39–117)
BILIRUBIN TOTAL: 0.6 mg/dL (ref 0.3–1.2)
BUN: 12 mg/dL (ref 6–23)
CHLORIDE: 103 meq/L (ref 96–112)
CO2: 20 meq/L (ref 19–32)
Calcium: 9.2 mg/dL (ref 8.4–10.5)
Creatinine, Ser: 0.71 mg/dL (ref 0.50–1.10)
GFR calc Af Amer: 85 mL/min — ABNORMAL LOW (ref >90)
GFR, EST NON AFRICAN AMERICAN: 74 mL/min — AB (ref >90)
Glucose, Bld: 96 mg/dL (ref 70–99)
Potassium: 4 mEq/L (ref 3.7–5.3)
Sodium: 138 mEq/L (ref 137–147)
Total Protein: 7.1 g/dL (ref 6.0–8.3)

## 2013-09-03 LAB — TROPONIN I: Troponin I: <0.3 ng/mL (ref <0.30)

## 2013-09-03 NOTE — Discharge Summary (Signed)
Physician Discharge Summary  [name] KGM:[id number] DOB: 1921-04-12 DOA: 09/02/2013  PCP: [name], MD  Admit date: 09/02/2013 Discharge date: 09/03/2013  Time spent: 35 minutes  Recommendations for Outpatient Follow-up:  Follow up with PCP in 1-2 weeks Follow up with Cardiology as scheduled  Discharge Diagnoses:  Active Problems:   DYSPNEA ON EXERTION   HTN (hypertension)   Chest pain   Discharge Condition: Stable  Diet recommendation: Regular  Filed Weights   09/02/13 1621 09/03/13 0451  Weight: 68.992 kg (152 lb 1.6 oz) 68.777 kg (151 lb 10 oz)    History of present illness:  See h and p from 8/21 for details. Briefly, pt presents with chest pain on exertion associated with nausea and sob. Pt was admitted to the hospitalist service for further work up .  Hospital Course:  The patient was admitted to the floor. Serial cardiac enzymes were neg x 4. A 2 d echo was obtained that appeared unremarkable. The patient remained medically stable with no further episodes of chest discomfort. The pt has a schedule appointment with her primary cardiologist in several weeks, whom she will follow up with. Pt is otherwise medically stable for discharge.  Discharge Exam: Filed Vitals:   09/02/13 1621 09/02/13 2058 09/02/13 2112 09/03/13 0451  BP: 102/87 143/63 128/67 140/49  Pulse: 74 83  72  Temp: 97.5 F (36.4 C) 97.7 F (36.5 C)  98.3 F (36.8 C)  TempSrc: Oral Oral  Oral  Resp: 17 16  18   Height: 5\' 5"  (1.651 m)     Weight: 68.992 kg (152 lb 1.6 oz)   68.777 kg (151 lb 10 oz)  SpO2: 97% 96%  93%    General: Awake, in nad Cardiovascular: regular, s1, s2 Respiratory: normal resp effort, no wheezing  Discharge Instructions     Medication List         ALPRAZolam 0.25 MG tablet  Commonly known as:  XANAX  Take 0.125 mg by mouth daily as needed for anxiety.     amLODipine 5 MG tablet  Commonly known as:  NORVASC  Take 1 tablet (5 mg total) by mouth  every evening.     aspirin 81 MG tablet  Take 20.25 mg by mouth daily. Takes 1/4 of an aspirin=20.25mg      cholecalciferol 1000 UNITS tablet  Commonly known as:  VITAMIN D  Take 1,000 Units by mouth every evening.     levothyroxine 25 MCG tablet  Commonly known as:  SYNTHROID, LEVOTHROID  Take 25 mcg by mouth daily before breakfast.     multivitamin with minerals Tabs tablet  Take 1 tablet by mouth daily.     nebivolol 5 MG tablet  Commonly known as:  BYSTOLIC  Take 1 tablet (5 mg total) by mouth every evening.     pravastatin 20 MG tablet  Commonly known as:  PRAVACHOL  Take 20 mg by mouth every evening.     PRESERVISION AREDS Caps  Take 1 capsule by mouth 2 (two) times daily.     sertraline 25 MG tablet  Commonly known as:  ZOLOFT  Take 25 mg by mouth every morning.       No Known Allergies Follow-up Information   Follow up with [name], MD. Schedule an appointment as soon as possible for a visit in 1 week.   Specialty:  Family Medicine   Contact information:   [place] [address] [phone]       Follow up  with Darden Amber., MD. (as scheduled)    Specialty:  Cardiology   Contact information:   [place] [address] [phone]        The results of significant diagnostics from this hospitalization (including imaging, microbiology, ancillary and laboratory) are listed below for reference.    Significant Diagnostic Studies: Dg Chest Port 1 View  09/02/2013   CLINICAL DATA:  Chest pain  EXAM: PORTABLE CHEST - 1 VIEW  COMPARISON:  February 02, 2012  FINDINGS: There is no edema or consolidation. Heart size and pulmonary vascularity are normal. No adenopathy. No pneumothorax. No bone lesions.  IMPRESSION: No edema or consolidation.   Electronically Signed   By: Lowella Grip M.D.   On: 09/02/2013 13:55    Microbiology: No results found for this or any previous visit (from the past 240  hour(s)).   Labs: Basic Metabolic Panel:  Recent Labs Lab 09/02/13 1227 09/02/13 1514 09/03/13 0555  NA 142  --  138  K 4.5  --  4.0  CL 104  --  103  CO2 24  --  20  GLUCOSE 119*  --  96  BUN 12  --  12  CREATININE 0.66 0.58 0.71  CALCIUM 9.5  --  9.2   Liver Function Tests:  Recent Labs Lab 09/03/13 0555  AST 14  ALT 10  ALKPHOS 81  BILITOT 0.6  PROT 7.1  ALBUMIN 3.4*   No results found for this basename: LIPASE, AMYLASE,  in the last 168 hours No results found for this basename: AMMONIA,  in the last 168 hours CBC:  Recent Labs Lab 09/02/13 1227 09/02/13 1514 09/03/13 0555  WBC 10.8* 10.3 6.9  HGB 13.7 13.3 11.8*  HCT 41.5 39.8 36.0  MCV 95.2 94.1 92.1  PLT 338 295 294   Cardiac Enzymes:  Recent Labs Lab 09/02/13 1600 09/02/13 2144 09/03/13 0555  TROPONINI <0.30 <0.30 <0.30   BNP: BNP (last 3 results)  Recent Labs  09/02/13 1227  PROBNP 149.9   CBG: No results found for this basename: GLUCAP,  in the last 168 hours  Signed:  CHIU, STEPHEN K  [facility] 09/03/2013, 12:55 PM

## 2013-09-03 NOTE — Progress Notes (Signed)
UR completed 

## 2013-09-03 NOTE — Progress Notes (Signed)
  Echocardiogram 2D Echocardiogram has been performed.  [name] FRANCES 09/03/2013, 9:14 AM

## 2013-09-09 DIAGNOSIS — E7800 Pure hypercholesterolemia, unspecified: Secondary | ICD-10-CM | POA: Diagnosis not present

## 2013-09-09 DIAGNOSIS — R0789 Other chest pain: Secondary | ICD-10-CM | POA: Diagnosis not present

## 2013-09-09 DIAGNOSIS — Z23 Encounter for immunization: Secondary | ICD-10-CM | POA: Diagnosis not present

## 2013-09-09 DIAGNOSIS — Z79899 Other long term (current) drug therapy: Secondary | ICD-10-CM | POA: Diagnosis not present

## 2013-09-20 ENCOUNTER — Ambulatory Visit (INDEPENDENT_AMBULATORY_CARE_PROVIDER_SITE_OTHER): Payer: Medicare Other | Admitting: Cardiovascular Disease

## 2013-09-20 ENCOUNTER — Encounter: Payer: Self-pay | Admitting: Cardiovascular Disease

## 2013-09-20 VITALS — BP 132/50 | HR 46 | Ht 64.0 in | Wt 154.6 lb

## 2013-09-20 DIAGNOSIS — I519 Heart disease, unspecified: Secondary | ICD-10-CM | POA: Diagnosis not present

## 2013-09-20 DIAGNOSIS — I10 Essential (primary) hypertension: Secondary | ICD-10-CM | POA: Diagnosis not present

## 2013-09-20 DIAGNOSIS — I5189 Other ill-defined heart diseases: Secondary | ICD-10-CM

## 2013-09-20 NOTE — Assessment & Plan Note (Signed)
BP is much better. Continue current meds. I will see her in 6 months

## 2013-09-20 NOTE — Assessment & Plan Note (Signed)
She overall seems to be doing fairly well. Her blood pressure is much better control. She may have some diastolic dysfunction but overall this seems to be stable. Continue with same medications

## 2013-09-20 NOTE — Progress Notes (Signed)
[name] Date of Birth  14-Feb-1921 [facility] [address]    [place], [place]  [address] [phone]  Fax  [phone]  Problem list: 1. Hypertension 2. Hyperlipidemia 3. Hypothyroidism  History of Present Illness:  [name] is an 80 year old female with a history of diastolic dysfunction, mild aortic stenosis and hypothyroidism. She also has a history of tachybradycardia syndrome.  She's had a stress Myoview study in May of 2010 which was normal. She has an ejection fraction of 75%. There was no ischemia.  She had an echocardiogram performed in September, 2001 which revealed normal left ventricular systolic function with moderate left ventricular hypertrophy and mild aortic stenosis.  She presents today for followup visit. She does not have any complaints. She denies any chest pain or shortness of breath.     Sept. 12, 2014:  [name] is doing well now.  She has had some dizziness and had several episodes of near syncope.   Both episodes occurred while she was sitting.  She was diagnosed with vertigo and was given meclizine.   24 hour Holter revealed NSR and 1 PVC.  We have an echocardiogram from October, 2013 which revealed normal left ventricular systolic function. She has mild aortic stenosis.  March 23, 2013:  She was last seen in Sept. 2014.  Her BP has been a bit more variable.   Her blood pressure varies between 150s to 120s. She does not eat any extra salt but she eats at the regular food bar at [facility]  - her husband eats at the heart healthy food bar.    Sept. 8, 2015:  She is being more careful with her salt intake.  She is feeling well. She has some DOE.   She was admitted to the hospital last month with some shortness breath. Her troponin levels were negative. Echocardiogram revealed mild aortic stenosis.  Left ventricle: The cavity size was normal. Wall thickness was increased in a pattern of mild LVH.  Systolic function was normal. The estimated ejection fraction was in the range of 55% to 60%. Doppler parameters are consistent with abnormal left ventricular relaxation (grade 1 diastolic dysfunction). - Aortic valve: Moderately calcified annulus. There was mild stenosis. There was trivial regurgitation. Valve area (VTI): 1.32 cm^2. Valve area (Vmax): 1.46 cm^2. Valve area (Vmean): 1.34 cm^2. - Mitral valve: Calcified annulus. - Left atrium: The atrium was mildly dilated. - Atrial septum: No defect or patent foramen ovale was identified    Current Outpatient Prescriptions on File Prior to Visit  Medication Sig Dispense Refill  . ALPRAZolam (XANAX) 0.25 MG tablet Take 0.125 mg by mouth daily as needed for anxiety.       [name] amLODipine (NORVASC) 5 MG tablet Take 1 tablet (5 mg total) by mouth every evening.  90 tablet  3  . aspirin 81 MG tablet Take 20.25 mg by mouth daily. Takes 1/4 of an aspirin=20.25mg       . levothyroxine (SYNTHROID, LEVOTHROID) 25 MCG tablet Take 25 mcg by mouth daily before breakfast.       . Multiple Vitamin (MULTIVITAMIN WITH MINERALS) TABS tablet Take 1 tablet by mouth daily.      . Multiple Vitamins-Minerals (PRESERVISION AREDS) CAPS Take 1 capsule by mouth 2 (two) times daily.      . nebivolol (BYSTOLIC) 5 MG tablet Take 1 tablet (5 mg total) by mouth every evening.  90 tablet  1  . pravastatin (PRAVACHOL) 20 MG  tablet Take 20 mg by mouth every evening.       . sertraline (ZOLOFT) 25 MG tablet Take 25 mg by mouth every morning.       . [DISCONTINUED] Calcium Carb-Cholecalciferol (CALCIUM 1000 + D PO) Take 1,000 mg by mouth daily.        . [DISCONTINUED] escitalopram (LEXAPRO) 10 MG tablet Take 10 mg by mouth daily.         No current facility-administered medications on file prior to visit.    No Known Allergies  Past Medical History  Diagnosis Date  . Hypertension   . Hyperlipidemia   . Tachycardia     history of tachycardia and bradycardia  .  Hypothyroidism   . Dyspnea   . Diastolic dysfunction     mild   . Mild mitral regurgitation by prior echocardiogram   . Bradycardia     hospitalized for   . Arthritis     Past Surgical History  Procedure Laterality Date  . Total abdominal hysterectomy    . Cholecystectomy    . Extracorporeal shock wave lithotripsy    . Tonsillectomy      History  Smoking status  . Never Smoker   Smokeless tobacco  . Never Used    History  Alcohol Use No    Family History  Problem Relation Age of Onset  . Emphysema Father   . Heart failure Mother   . Heart attack Brother   . Cancer Brother     kidney  . Stroke Brother     Reviw of Systems:  Reviewed in the HPI.  All other systems are negative.  Physical Exam: BP 132/50  Pulse 46  Ht 5\' 4"  (1.626 m)  Wt 154 lb 9.6 oz (70.126 kg)  BMI 26.52 kg/m2 The patient is alert and oriented x 3.  The mood and affect are normal.   Skin: warm and dry.  Color is normal.    HEENT:   Normocephalic/atraumatic. Her carotids are normal.  Lungs: clear  Heart: Regular rate S1-S2. Chest soft 7-4/2 systolic murmur.    Abdomen: Abdomen is benign.  Extremities:  No c/c/e  Neuro:  Hard of hearing, otherwise nonfocal    ECG: 09/24/2012: Normal sinus rhythm at 84 beats a minute. The EKG is essentially normal. Assessment / Plan:

## 2013-09-20 NOTE — Patient Instructions (Signed)
Your physician recommends that you continue on your current medications as directed. Please refer to the Current Medication list given to you today.  Your physician wants you to follow-up in: 6 months with Dr. [name].  You will receive a reminder letter in the mail two months in advance. If you don't receive a letter, please call our office to schedule the follow-up appointment.  

## 2013-09-21 ENCOUNTER — Other Ambulatory Visit: Payer: Self-pay

## 2013-09-21 MED ORDER — NEBIVOLOL HCL 5 MG PO TABS: 5.0000 mg | ORAL_TABLET | Freq: Every evening | ORAL | Status: DC

## 2013-09-24 ENCOUNTER — Encounter (HOSPITAL_COMMUNITY): Payer: Self-pay | Admitting: Emergency Medicine

## 2013-09-24 ENCOUNTER — Emergency Department (HOSPITAL_COMMUNITY): Payer: Medicare Other

## 2013-09-24 ENCOUNTER — Inpatient Hospital Stay (HOSPITAL_COMMUNITY)
Admission: EM | Admit: 2013-09-24 | Discharge: 2013-09-27 | DRG: 292 | Disposition: A | Discharge status: Home / Self Care | Payer: Medicare Other | Attending: Internal Medicine | Admitting: Internal Medicine

## 2013-09-24 DIAGNOSIS — Z79899 Other long term (current) drug therapy: Secondary | ICD-10-CM | POA: Diagnosis not present

## 2013-09-24 DIAGNOSIS — F3289 Other specified depressive episodes: Secondary | ICD-10-CM | POA: Diagnosis present

## 2013-09-24 DIAGNOSIS — H44009 Unspecified purulent endophthalmitis, unspecified eye: Secondary | ICD-10-CM | POA: Diagnosis present

## 2013-09-24 DIAGNOSIS — I959 Hypotension, unspecified: Secondary | ICD-10-CM | POA: Diagnosis present

## 2013-09-24 DIAGNOSIS — I059 Rheumatic mitral valve disease, unspecified: Secondary | ICD-10-CM | POA: Diagnosis present

## 2013-09-24 DIAGNOSIS — E039 Hypothyroidism, unspecified: Secondary | ICD-10-CM | POA: Diagnosis present

## 2013-09-24 DIAGNOSIS — I509 Heart failure, unspecified: Secondary | ICD-10-CM | POA: Diagnosis not present

## 2013-09-24 DIAGNOSIS — R0609 Other forms of dyspnea: Secondary | ICD-10-CM

## 2013-09-24 DIAGNOSIS — I5030 Unspecified diastolic (congestive) heart failure: Secondary | ICD-10-CM

## 2013-09-24 DIAGNOSIS — I495 Sick sinus syndrome: Secondary | ICD-10-CM | POA: Diagnosis present

## 2013-09-24 DIAGNOSIS — Z7982 Long term (current) use of aspirin: Secondary | ICD-10-CM | POA: Diagnosis not present

## 2013-09-24 DIAGNOSIS — I359 Nonrheumatic aortic valve disorder, unspecified: Secondary | ICD-10-CM

## 2013-09-24 DIAGNOSIS — I350 Nonrheumatic aortic (valve) stenosis: Secondary | ICD-10-CM

## 2013-09-24 DIAGNOSIS — I493 Ventricular premature depolarization: Secondary | ICD-10-CM

## 2013-09-24 DIAGNOSIS — I498 Other specified cardiac arrhythmias: Secondary | ICD-10-CM | POA: Diagnosis not present

## 2013-09-24 DIAGNOSIS — I10 Essential (primary) hypertension: Secondary | ICD-10-CM | POA: Diagnosis present

## 2013-09-24 DIAGNOSIS — H44002 Unspecified purulent endophthalmitis, left eye: Secondary | ICD-10-CM | POA: Diagnosis present

## 2013-09-24 DIAGNOSIS — R9431 Abnormal electrocardiogram [ECG] [EKG]: Secondary | ICD-10-CM | POA: Diagnosis not present

## 2013-09-24 DIAGNOSIS — R079 Chest pain, unspecified: Secondary | ICD-10-CM | POA: Diagnosis not present

## 2013-09-24 DIAGNOSIS — R0989 Other specified symptoms and signs involving the circulatory and respiratory systems: Secondary | ICD-10-CM | POA: Diagnosis not present

## 2013-09-24 DIAGNOSIS — E785 Hyperlipidemia, unspecified: Secondary | ICD-10-CM | POA: Diagnosis present

## 2013-09-24 DIAGNOSIS — F329 Major depressive disorder, single episode, unspecified: Secondary | ICD-10-CM | POA: Diagnosis present

## 2013-09-24 DIAGNOSIS — R0600 Dyspnea, unspecified: Secondary | ICD-10-CM

## 2013-09-24 DIAGNOSIS — I5033 Acute on chronic diastolic (congestive) heart failure: Secondary | ICD-10-CM | POA: Diagnosis present

## 2013-09-24 DIAGNOSIS — N390 Urinary tract infection, site not specified: Secondary | ICD-10-CM | POA: Diagnosis present

## 2013-09-24 DIAGNOSIS — N3000 Acute cystitis without hematuria: Secondary | ICD-10-CM

## 2013-09-24 DIAGNOSIS — I4949 Other premature depolarization: Secondary | ICD-10-CM | POA: Diagnosis not present

## 2013-09-24 DIAGNOSIS — R0602 Shortness of breath: Secondary | ICD-10-CM | POA: Diagnosis not present

## 2013-09-24 DIAGNOSIS — E876 Hypokalemia: Secondary | ICD-10-CM | POA: Diagnosis present

## 2013-09-24 DIAGNOSIS — H00016 Hordeolum externum left eye, unspecified eyelid: Secondary | ICD-10-CM

## 2013-09-24 DIAGNOSIS — I499 Cardiac arrhythmia, unspecified: Secondary | ICD-10-CM

## 2013-09-24 LAB — CBC
HCT: 39.2 % (ref 36.0–46.0)
HCT: 38.3 % (ref 36.0–46.0)
Hemoglobin: 13.2 g/dL (ref 12.0–15.0)
Hemoglobin: 13 g/dL (ref 12.0–15.0)
MCH: 31.3 pg (ref 26.0–34.0)
MCH: 31.2 pg (ref 26.0–34.0)
MCHC: 33.7 g/dL (ref 30.0–36.0)
MCHC: 33.9 g/dL (ref 30.0–36.0)
MCV: 92.9 fL (ref 78.0–100.0)
MCV: 91.8 fL (ref 78.0–100.0)
PLATELETS: 327 10*3/uL (ref 150–400)
PLATELETS: 331 10*3/uL (ref 150–400)
RBC: 4.22 MIL/uL (ref 3.87–5.11)
RBC: 4.17 MIL/uL (ref 3.87–5.11)
RDW: 14.2 % (ref 11.5–15.5)
RDW: 14.1 % (ref 11.5–15.5)
WBC: 10.8 10*3/uL — ABNORMAL HIGH (ref 4.0–10.5)
WBC: 10.1 10*3/uL (ref 4.0–10.5)

## 2013-09-24 LAB — COMPREHENSIVE METABOLIC PANEL
ALK PHOS: 90 U/L (ref 39–117)
ALT: 12 U/L (ref 0–35)
AST: 16 U/L (ref 0–37)
Albumin: 3.5 g/dL (ref 3.5–5.2)
Anion gap: 13 (ref 5–15)
BUN: 14 mg/dL (ref 6–23)
CO2: 23 mEq/L (ref 19–32)
Calcium: 9.5 mg/dL (ref 8.4–10.5)
Chloride: 105 mEq/L (ref 96–112)
Creatinine, Ser: 0.73 mg/dL (ref 0.50–1.10)
GFR calc non Af Amer: 73 mL/min — ABNORMAL LOW (ref >90)
GFR, EST AFRICAN AMERICAN: 85 mL/min — AB (ref >90)
GLUCOSE: 91 mg/dL (ref 70–99)
POTASSIUM: 4.4 meq/L (ref 3.7–5.3)
SODIUM: 141 meq/L (ref 137–147)
TOTAL PROTEIN: 7.5 g/dL (ref 6.0–8.3)
Total Bilirubin: 0.4 mg/dL (ref 0.3–1.2)

## 2013-09-24 LAB — TROPONIN I
Troponin I: <0.3 ng/mL (ref <0.30)
Troponin I: <0.3 ng/mL (ref <0.30)

## 2013-09-24 LAB — URINALYSIS, ROUTINE W REFLEX MICROSCOPIC
Bilirubin Urine: NEGATIVE
GLUCOSE, UA: NEGATIVE mg/dL
KETONES UR: NEGATIVE mg/dL
Nitrite: NEGATIVE
PH: 6 (ref 5.0–8.0)
Protein, ur: NEGATIVE mg/dL
Specific Gravity, Urine: 1.019 (ref 1.005–1.030)
Urobilinogen, UA: 0.2 mg/dL (ref 0.0–1.0)

## 2013-09-24 LAB — CREATININE, SERUM
Creatinine, Ser: 0.81 mg/dL (ref 0.50–1.10)
GFR, EST AFRICAN AMERICAN: 72 mL/min — AB (ref >90)
GFR, EST NON AFRICAN AMERICAN: 62 mL/min — AB (ref >90)

## 2013-09-24 LAB — URINE MICROSCOPIC-ADD ON

## 2013-09-24 LAB — PRO B NATRIURETIC PEPTIDE: PRO B NATRI PEPTIDE: 818.9 pg/mL — AB (ref 0–450)

## 2013-09-24 MED ORDER — TOBRAMYCIN 0.3 % OP SOLN
1.0000 [drp] | Freq: Four times a day (QID) | OPHTHALMIC | Status: DC
  Administered 2013-09-24 – 2013-09-27 (×12): 1 [drp] via OPHTHALMIC
  Filled 2013-09-24: qty 5

## 2013-09-24 MED ORDER — SODIUM CHLORIDE 0.9 % IV SOLN
INTRAVENOUS | Status: DC
  Administered 2013-09-24: 15:00:00 via INTRAVENOUS

## 2013-09-24 MED ORDER — SERTRALINE HCL 25 MG PO TABS
25.0000 mg | ORAL_TABLET | Freq: Every morning | ORAL | Status: DC
  Administered 2013-09-25 – 2013-09-27 (×3): 25 mg via ORAL
  Filled 2013-09-24 (×3): qty 1

## 2013-09-24 MED ORDER — VITAMIN D3 25 MCG (1000 UNIT) PO TABS
1000.0000 [IU] | ORAL_TABLET | Freq: Every evening | ORAL | Status: DC
  Administered 2013-09-24 – 2013-09-26 (×3): 1000 [IU] via ORAL
  Filled 2013-09-24 (×4): qty 1

## 2013-09-24 MED ORDER — SODIUM CHLORIDE 0.9 % IV SOLN
250.0000 mL | INTRAVENOUS | Status: DC | PRN
Start: 2013-09-24 — End: 2013-09-27

## 2013-09-24 MED ORDER — ACETAMINOPHEN 325 MG PO TABS: 650.0000 mg | ORAL_TABLET | ORAL | Status: DC | PRN

## 2013-09-24 MED ORDER — DEXTROSE 5 % IV SOLN
1.0000 g | INTRAVENOUS | Status: DC
  Administered 2013-09-25 – 2013-09-26 (×2): 1 g via INTRAVENOUS
  Filled 2013-09-24 (×3): qty 10

## 2013-09-24 MED ORDER — ASPIRIN 81 MG PO CHEW
20.2500 mg | CHEWABLE_TABLET | Freq: Every day | ORAL | Status: DC
  Administered 2013-09-26 – 2013-09-27 (×2): 20.25 mg via ORAL
  Filled 2013-09-24 (×2): qty 1

## 2013-09-24 MED ORDER — ONDANSETRON HCL 4 MG/2ML IJ SOLN: 4.0000 mg | Freq: Four times a day (QID) | INTRAMUSCULAR | Status: DC | PRN

## 2013-09-24 MED ORDER — ENOXAPARIN SODIUM 40 MG/0.4ML ~~LOC~~ SOLN
40.0000 mg | SUBCUTANEOUS | Status: DC
  Administered 2013-09-24 – 2013-09-26 (×3): 40 mg via SUBCUTANEOUS
  Filled 2013-09-24 (×4): qty 0.4

## 2013-09-24 MED ORDER — FUROSEMIDE 10 MG/ML IJ SOLN
40.0000 mg | Freq: Two times a day (BID) | INTRAMUSCULAR | Status: DC
Start: 2013-09-24 — End: 2013-09-27
  Administered 2013-09-24 – 2013-09-27 (×6): 40 mg via INTRAVENOUS
  Filled 2013-09-24 (×7): qty 4

## 2013-09-24 MED ORDER — DEXTROSE 5 % IV SOLN
1.0000 g | Freq: Once | INTRAVENOUS | Status: AC
  Administered 2013-09-24: 1 g via INTRAVENOUS
  Filled 2013-09-24: qty 10

## 2013-09-24 MED ORDER — NEBIVOLOL HCL 10 MG PO TABS
10.0000 mg | ORAL_TABLET | Freq: Every day | ORAL | Status: DC
  Administered 2013-09-24 – 2013-09-27 (×4): 10 mg via ORAL
  Filled 2013-09-24 (×4): qty 1

## 2013-09-24 MED ORDER — SODIUM CHLORIDE 0.9 % IJ SOLN
3.0000 mL | Freq: Two times a day (BID) | INTRAMUSCULAR | Status: DC
  Administered 2013-09-24 – 2013-09-27 (×6): 3 mL via INTRAVENOUS

## 2013-09-24 MED ORDER — SODIUM CHLORIDE 0.9 % IJ SOLN: 3.0000 mL | INTRAMUSCULAR | Status: DC | PRN

## 2013-09-24 MED ORDER — AMLODIPINE BESYLATE 5 MG PO TABS
5.0000 mg | ORAL_TABLET | Freq: Every evening | ORAL | Status: DC
  Administered 2013-09-24 – 2013-09-25 (×2): 5 mg via ORAL
  Filled 2013-09-24 (×4): qty 1

## 2013-09-24 MED ORDER — OCUVITE-LUTEIN PO CAPS
1.0000 | ORAL_CAPSULE | Freq: Two times a day (BID) | ORAL | Status: DC
  Administered 2013-09-24 – 2013-09-27 (×6): 1 via ORAL
  Filled 2013-09-24 (×7): qty 1

## 2013-09-24 MED ORDER — LEVOTHYROXINE SODIUM 25 MCG PO TABS
25.0000 ug | ORAL_TABLET | Freq: Every day | ORAL | Status: DC
Start: 2013-09-25 — End: 2013-09-25
  Administered 2013-09-25: 25 ug via ORAL
  Filled 2013-09-24 (×2): qty 1

## 2013-09-24 MED ORDER — FUROSEMIDE 10 MG/ML IJ SOLN
20.0000 mg | Freq: Once | INTRAMUSCULAR | Status: DC
  Filled 2013-09-24: qty 2

## 2013-09-24 MED ORDER — SIMVASTATIN 10 MG PO TABS
10.0000 mg | ORAL_TABLET | Freq: Every day | ORAL | Status: DC
  Administered 2013-09-24 – 2013-09-26 (×3): 10 mg via ORAL
  Filled 2013-09-24 (×4): qty 1

## 2013-09-24 MED ORDER — ADULT MULTIVITAMIN W/MINERALS CH
1.0000 | ORAL_TABLET | Freq: Every day | ORAL | Status: DC
  Administered 2013-09-24 – 2013-09-27 (×4): 1 via ORAL
  Filled 2013-09-24 (×4): qty 1

## 2013-09-24 MED ORDER — ALPRAZOLAM 0.25 MG PO TABS: 0.1250 mg | ORAL_TABLET | Freq: Every day | ORAL | Status: DC | PRN

## 2013-09-24 NOTE — H&P (Signed)
History and Physical       Hospital Admission Note Date: 09/24/2013  Patient [name] Medical record number: [id number] Date of birth: July 30, 1921 Age: 80 y.o. Gender: female  PCP: [name], MD   Primary cardiologist: Dr Acie Fredrickson  Chief Complaint:  Shortness of breath worsened in the last 2-3 days  HPI: Patient is 80 year old female with history of diastolic dysfunction (EF 34-19%, grade 1 diastolic dysfunction), hypothyroidism, mild aortic stenosis, tachybradycardia syndrome who presented with shortness of breath worsened in the last 2-3 days. Patient reported that she was at [facility] today with shortness of breath and left eye infection. However her PCP sent her to the ER due to acute on chronic CHF. Patient reported that she has been getting dyspneic with minimal exertion, she was seen by her cardiologist, Dr Acie Fredrickson last week, was recently discharged from the hospital on 09/03/13. Patient denied any cough, fevers or chills or any URI symptoms. She denied any fevers or chills or any dysuria. Denies any significant orthopnea or PND.  ER workup showed UTI, chest x-ray showed interstitial edema, initial heart rate in ED was in the 30s-low 40s, at the time of my encounter in 90s-103, patient has a known history of tachybradycardia syndrome.  Review of Systems:  Constitutional: Denies fever, chills, diaphoresis, poor appetite and fatigue.  HEENT: Denies photophobia, hearing loss, ear pain, congestion, sore throat, rhinorrhea, sneezing, mouth sores, trouble swallowing, neck pain, neck stiffness and tinnitus. + Left eye pain and discharge   Respiratory: Denies cough, chest tightness,  and wheezing.   Cardiovascular:  please see history of present illness  Gastrointestinal: Denies nausea, vomiting, abdominal pain, diarrhea, constipation, blood in stool and abdominal distention.  Genitourinary: Denies dysuria,  urgency, frequency, hematuria, flank pain and difficulty urinating.  Musculoskeletal: Denies myalgias, back pain, joint swelling, arthralgias and gait problem.  Skin: Denies pallor, rash and wound.  Neurological: Denies dizziness, seizures, syncope, weakness, light-headedness, numbness and headaches.  Hematological: Denies adenopathy. Easy bruising, personal or family bleeding history  Psychiatric/Behavioral: Denies suicidal ideation, mood changes, confusion, nervousness, sleep disturbance and agitation  Past Medical History: Past Medical History  Diagnosis Date  . Hypertension   . Hyperlipidemia   . Tachycardia     history of tachycardia and bradycardia  . Hypothyroidism   . Dyspnea   . Diastolic dysfunction     mild   . Mild mitral regurgitation by prior echocardiogram   . Bradycardia     hospitalized for   . Arthritis    Past Surgical History  Procedure Laterality Date  . Total abdominal hysterectomy    . Cholecystectomy    . Extracorporeal shock wave lithotripsy    . Tonsillectomy      Medications: Prior to Admission medications   Medication Sig Start Date End Date Taking? Authorizing Provider  ALPRAZolam (XANAX) 0.25 MG tablet Take 0.125 mg by mouth daily as needed for anxiety.    Yes Historical Provider, MD  amLODipine (NORVASC) 5 MG tablet Take 1 tablet (5 mg total) by mouth every evening. 11/01/12  Yes Thayer Headings, MD  aspirin 81 MG tablet Take 20.25 mg by mouth daily. Takes 1/4 of an aspirin=20.25mg    Yes Historical Provider, MD  cholecalciferol (VITAMIN D) 1000 UNITS tablet Take 1,000 Units by mouth every evening.   Yes Historical Provider, MD  levothyroxine (SYNTHROID, LEVOTHROID) 25 MCG tablet Take 25 mcg by mouth daily before breakfast.    Yes Historical Provider, MD  Multiple Vitamin (MULTIVITAMIN WITH MINERALS)  TABS tablet Take 1 tablet by mouth daily.   Yes Historical Provider, MD  Multiple Vitamins-Minerals (PRESERVISION AREDS) CAPS Take 1 capsule by mouth  2 (two) times daily.   Yes Historical Provider, MD  nebivolol (BYSTOLIC) 5 MG tablet Take 1 tablet (5 mg total) by mouth every evening. 09/21/13  Yes Thayer Headings, MD  pravastatin (PRAVACHOL) 20 MG tablet Take 20 mg by mouth every evening.    Yes Historical Provider, MD  sertraline (ZOLOFT) 25 MG tablet Take 25 mg by mouth every morning.  08/15/13  Yes Historical Provider, MD    Allergies:  No Known Allergies  Social History:  reports that she has never smoked. She has never used smokeless tobacco. She reports that she does not drink alcohol or use illicit drugs.  Family History: Family History  Problem Relation Age of Onset  . Emphysema Father   . Heart failure Mother   . Heart attack Brother   . Cancer Brother     kidney  . Stroke Brother     Physical Exam: Blood pressure 151/57, pulse 46, temperature 97 F (36.1 C), temperature source Oral, resp. rate 20, SpO2 98.00%. General: Alert, awake, oriented x3, in no acute distress. HEENT: normocephalic, atraumatic, anicteric sclera, pink conjunctiva, pupils equal and reactive to light and accomodation, oropharynx clear Neck: supple, no masses or lymphadenopathy, no goiter, no bruits  Heart: Regular rate and rhythm, 2/6 SEM. Lungs: Clear to auscultation bilaterally, no wheezing, rales or rhonchi. Abdomen: Soft, nontender, nondistended, positive bowel sounds, no masses. Extremities: No clubbing, cyanosis or edema with positive pedal pulses. Neuro: Grossly intact, no focal neurological deficits, strength 5/5 upper and lower extremities bilaterally Psych: alert and oriented x 3, normal mood and affect Skin: no rashes or lesions, warm and dry   LABS on Admission:  Basic Metabolic Panel:  Recent Labs Lab 09/24/13 1140  NA 141  K 4.4  CL 105  CO2 23  GLUCOSE 91  BUN 14  CREATININE 0.73  CALCIUM 9.5   Liver Function Tests:  Recent Labs Lab 09/24/13 1140  AST 16  ALT 12  ALKPHOS 90  BILITOT 0.4  PROT 7.5  ALBUMIN 3.5    No results found for this basename: LIPASE, AMYLASE,  in the last 168 hours No results found for this basename: AMMONIA,  in the last 168 hours CBC:  Recent Labs Lab 09/24/13 1140  WBC 10.8*  HGB 13.2  HCT 39.2  MCV 92.9  PLT 327   Cardiac Enzymes:  Recent Labs Lab 09/24/13 1140  TROPONINI <0.30   BNP: No components found with this basename: POCBNP,  CBG: No results found for this basename: GLUCAP,  in the last 168 hours   Radiological Exams on Admission: Dg Chest 2 View  09/24/2013   CLINICAL DATA:  Irregular EKG.  Hypertension.  Chest pain.  EXAM: CHEST  2 VIEW  COMPARISON:  09/02/2013.  FINDINGS: Cardiopericardial silhouette within normal limits. Mediastinal contours normal. Aortic arch atherosclerosis. Diffusely increased interstitial markings are present, most compatible with interstitial pulmonary edema. There is no pleural effusion.  IMPRESSION: Nonspecific diffuse interstitial prominence, most compatible with interstitial edema.   Electronically Signed   By: Dereck Ligas M.D.   On: 09/24/2013 14:13   Dg Chest Port 1 View  09/02/2013   CLINICAL DATA:  Chest pain  EXAM: PORTABLE CHEST - 1 VIEW  COMPARISON:  February 02, 2012  FINDINGS: There is no edema or consolidation. Heart size and pulmonary vascularity are normal. No adenopathy.  No pneumothorax. No bone lesions.  IMPRESSION: No edema or consolidation.   Electronically Signed   By: Lowella Grip M.D.   On: 09/02/2013 13:55   EKG showed rate 105, sinus tachycardia with frequent PVCs and bigeminy  Assessment/Plan Principal Problem:   Acute on chronic diastolic congestive heart failure: EF 66-06%, grade 1 diastolic dysfunction per echo in 08/2013 CHF precipitated due to bradycardia, HR 32 lowest in ED - Admit to telemetry, obtain serial cardiac enzymes, hold off on beta blocker - Placed on CHF pathway, strict I.'s and O.'s and daily weights, IV Lasix - Patient is not on ACEI outpatient - Cardiology has been  consulted by EDP, Dr. Ashok Cordia  Active Problems:   HTN (hypertension) -Currently stable, continue Amlodipine only     Hypothyroidism - Obtain TSH, continue Synthroid    UTI (urinary tract infection) - Obtain urine cultures and studies, placed on IV Rocephin    Abnormal EKG with bigeminy and PVCs - Will check magnesium level, potassium is within normal range    Infection of left eye - Placed on tobramycin eye drops   DVT prophylaxis: Lovenox   CODE STATUS: Full CODE STATUS   Family Communication: Admission, patients condition and plan of care including tests being ordered have been discussed with the patient  who indicates understanding and agree with the plan and Code Status   Further plan will depend as patient's clinical course evolves and further radiologic and laboratory data become available.   Time Spent on Admission: 1 hour   RAI,RIPUDEEP M.D. [facility] 09/24/2013, 4:07 PM Pager: [phone]  If 7PM-7AM, please contact night-coverage [email or web address] Password [email or web address]  **Disclaimer: This note was dictated with voice recognition software. Similar sounding words can inadvertently be transcribed and this note may contain transcription errors which may not have been corrected upon publication of note.**

## 2013-09-24 NOTE — Consult Note (Signed)
Reason for Consult: Worsening dyspnea on exertion  Requesting Physician: Rai  Cardiologist: [name]  HPI: This is a 80 y.o. female with a past medical history significant for mild aortic stenosis, echo signs of diastolic dysfunction, history of bradycardia, frequent PVCs, treated hypertension, hypothyroidism, hyperlipidemia presents with roughly 2 weeks of worsening exertional dyspnea, now occurring even when she performs activities of daily living. She has not experienced recent chest pressure pain, although she experienced a brief episode of pressure more than 2 weeks ago, when she was briefly hospitalized. She has not experienced syncope and denies any palpitations.  No recent changes in dietary sodium intake or medications. No weight changes that she is aware of.  In the emergency room she is found to have incessant PVCs in a pattern of bigeminy. The ventricular beats are extremely premature and did not generate a palpable pulse, making her effective heart rate be around 40. I'm not sure if this is the cause of previously described "bradycardia". She is not aware of the palpitations. She had a Holter monitor and event monitor performed in 2014 that showed frequent PVCs that are monomorphic and identical with the one seen today.  Echo in August 2015 showed normal left ventricular ejection fraction of 55-60% and grade 1 diastolic dysfunction. The peak aortic valve gradient was only 26 mm Hg and estimated aortic valve area 1.4 cm square. Also mentioned was mild pulmonary arterial hypertension , estimated systolic PA P. 40 mm Hg. she had a normal nuclear stress test in 2010.  PMHx:  Past Medical History  Diagnosis Date  . Hypertension   . Hyperlipidemia   . Tachycardia     history of tachycardia and bradycardia  . Hypothyroidism   . Dyspnea   . Diastolic dysfunction     mild   . Mild mitral regurgitation by prior echocardiogram   . Bradycardia     hospitalized for   .  Arthritis    Past Surgical History  Procedure Laterality Date  . Total abdominal hysterectomy    . Cholecystectomy    . Extracorporeal shock wave lithotripsy    . Tonsillectomy      FAMHx: Family History  Problem Relation Age of Onset  . Emphysema Father   . Heart failure Mother   . Heart attack Brother   . Cancer Brother     kidney  . Stroke Brother     SOCHx:  reports that she has never smoked. She has never used smokeless tobacco. She reports that she does not drink alcohol or use illicit drugs.  ALLERGIES: No Known Allergies  ROS: The patient specifically denies any chest pain at rest or with exertion, dyspnea at rest, orthopnea, paroxysmal nocturnal dyspnea, syncope, palpitations, focal neurological deficits, intermittent claudication, lower extremity edema, unexplained weight gain, cough, hemoptysis or wheezing.  The patient also denies abdominal pain, nausea, vomiting, dysphagia, diarrhea, constipation, polyuria, polydipsia, dysuria, hematuria, frequency, urgency, abnormal bleeding or bruising, fever, chills, unexpected weight changes, mood swings, change in skin or hair texture, change in voice quality, auditory or visual problems, allergic reactions or rashes, new musculoskeletal complaints other than usual "aches and pains".   HOME MEDICATIONS: Prescriptions prior to admission  Medication Sig Dispense Refill  . ALPRAZolam (XANAX) 0.25 MG tablet Take 0.125 mg by mouth daily as needed for anxiety.       [name] amLODipine (NORVASC) 5 MG tablet Take 1 tablet (5 mg total) by mouth every evening.  90 tablet  3  . aspirin  81 MG tablet Take 20.25 mg by mouth daily. Takes 1/4 of an aspirin=20.25mg       . cholecalciferol (VITAMIN D) 1000 UNITS tablet Take 1,000 Units by mouth every evening.      [name] levothyroxine (SYNTHROID, LEVOTHROID) 25 MCG tablet Take 25 mcg by mouth daily before breakfast.       . Multiple Vitamin (MULTIVITAMIN WITH MINERALS) TABS tablet Take 1 tablet by mouth  daily.      . Multiple Vitamins-Minerals (PRESERVISION AREDS) CAPS Take 1 capsule by mouth 2 (two) times daily.      . nebivolol (BYSTOLIC) 5 MG tablet Take 1 tablet (5 mg total) by mouth every evening.  90 tablet  1  . pravastatin (PRAVACHOL) 20 MG tablet Take 20 mg by mouth every evening.       . sertraline (ZOLOFT) 25 MG tablet Take 25 mg by mouth every morning.         HOSPITAL MEDICATIONS: Prior to Admission:  Prescriptions prior to admission  Medication Sig Dispense Refill  . ALPRAZolam (XANAX) 0.25 MG tablet Take 0.125 mg by mouth daily as needed for anxiety.       [name] amLODipine (NORVASC) 5 MG tablet Take 1 tablet (5 mg total) by mouth every evening.  90 tablet  3  . aspirin 81 MG tablet Take 20.25 mg by mouth daily. Takes 1/4 of an aspirin=20.25mg       . cholecalciferol (VITAMIN D) 1000 UNITS tablet Take 1,000 Units by mouth every evening.      [name] levothyroxine (SYNTHROID, LEVOTHROID) 25 MCG tablet Take 25 mcg by mouth daily before breakfast.       . Multiple Vitamin (MULTIVITAMIN WITH MINERALS) TABS tablet Take 1 tablet by mouth daily.      . Multiple Vitamins-Minerals (PRESERVISION AREDS) CAPS Take 1 capsule by mouth 2 (two) times daily.      . nebivolol (BYSTOLIC) 5 MG tablet Take 1 tablet (5 mg total) by mouth every evening.  90 tablet  1  . pravastatin (PRAVACHOL) 20 MG tablet Take 20 mg by mouth every evening.       . sertraline (ZOLOFT) 25 MG tablet Take 25 mg by mouth every morning.        Scheduled: . furosemide  20 mg Intravenous Once  . tobramycin  1 drop Left Eye 4 times per day    VITALS: Blood pressure 151/57, pulse 46, temperature 97 F (36.1 C), temperature source Oral, resp. rate 20, SpO2 98.00%.  PHYSICAL EXAM:  General: Alert, oriented x3, no distress Head: no evidence of trauma, PERRL, EOMI, no exophtalmos or lid lag, no myxedema, no xanthelasma; normal ears, nose and oropharynx Neck: 6 cm elevation in jugular venous pulsations and no hepatojugular reflux;  brisk carotid pulses without delay and no carotid bruits Chest: clear to auscultation, no signs of consolidation by percussion or palpation, normal fremitus, symmetrical and full respiratory excursions Cardiovascular: normal position and quality of the apical impulse, regular rhythm, normal first heart sound and normal second heart sound, no rubs or gallops, early peaking 3-6 systolic ejection aortic murmur Abdomen: no tenderness or distention, no masses by palpation, no abnormal pulsatility or arterial bruits, normal bowel sounds, no hepatosplenomegaly Extremities: no clubbing, cyanosis;  no edema; 2+ radial, ulnar and brachial pulses bilaterally; 2+ right femoral, posterior tibial and dorsalis pedis pulses; 2+ left femoral, posterior tibial and dorsalis pedis pulses; no subclavian or femoral bruits Neurological: grossly nonfocal   LABS  CBC  Recent Labs  09/24/13 1140  WBC  10.8*  HGB 13.2  HCT 39.2  MCV 92.9  PLT 678   Basic Metabolic Panel  Recent Labs  09/24/13 1140  NA 141  K 4.4  CL 105  CO2 23  GLUCOSE 91  BUN 14  CREATININE 0.73  CALCIUM 9.5   Liver Function Tests  Recent Labs  09/24/13 1140  AST 16  ALT 12  ALKPHOS 90  BILITOT 0.4  PROT 7.5  ALBUMIN 3.5   No results found for this basename: LIPASE, AMYLASE,  in the last 72 hours Cardiac Enzymes  Recent Labs  09/24/13 1140  TROPONINI <0.30     IMAGING: Dg Chest 2 View  09/24/2013   CLINICAL DATA:  Irregular EKG.  Hypertension.  Chest pain.  EXAM: CHEST  2 VIEW  COMPARISON:  09/02/2013.  FINDINGS: Cardiopericardial silhouette within normal limits. Mediastinal contours normal. Aortic arch atherosclerosis. Diffusely increased interstitial markings are present, most compatible with interstitial pulmonary edema. There is no pleural effusion.  IMPRESSION: Nonspecific diffuse interstitial prominence, most compatible with interstitial edema.   Electronically Signed   By: Dereck Ligas M.D.   On: 09/24/2013  14:13    ECG: Sinus rhythm with a very premature PVCs in a pattern of bigeminy, PVCs have a left bundle, leftward axis morphology with precordial transition from V1 to V2  TELEMETRY:  PVCs in bigeminy  IMPRESSION: 1. Acute on chronic diastolic heart failure 2. Very frequent bigeminal PVCs with effective bradycardia. I'm not sure if the PVCs are contributing to her heart failure or if they are simply a sign of increased myocardial irritability in the setting of heart failure 3. Mild aortic stenosis 4. I have seen no documentation of true bradycardia. I suspect previous episodes of "bradycardia" are related to undetected premature ventricular contractions when using a not a hematocrit blood pressure cuff or palpation of the peripheral pulse  RECOMMENDATION: 1. Diuretics 2. increase beta blocker  Curious to see if we are able to successfully suppress her PVCs and if this will have a positive impact on her heart failure.  Time Spent Directly with Patient: 45 minutes  Sanda Klein, MD, [facility] [phone] office [phone] pager   09/24/2013, 4:36 PM

## 2013-09-24 NOTE — ED Notes (Signed)
Pt to department via EMS- pt reports that she went to [place] today for SOB that increases with activity. Denies any chest pain or n/v. Sent here for further evaluation. Hr-50 with PVCs 95% on RA. Bp-141/89.

## 2013-09-24 NOTE — ED Provider Notes (Addendum)
CSN: [id number]     Arrival date & time 09/24/13  1128 History   First MD Initiated Contact with Patient 09/24/13 1129     Chief Complaint  Patient presents with  . Shortness of Breath     (Consider location/radiation/quality/duration/timing/severity/associated sxs/prior Treatment) Patient is a 80 y.o. female presenting with shortness of breath. The history is provided by the patient.  Shortness of Breath Associated symptoms: no abdominal pain, no chest pain, no cough, no fever, no headaches, no neck pain, no rash, no sore throat and no vomiting   pt c/o sob, dyspnea with minimal exertion, getting slowly worse in the past week. Notes hx same. Was seen by cardiology in past week. Pt denies change in meds. Compliant w normal meds. Denies cough or uri c/o. No fever or chills. No current or recent chest pain or discomfort. No leg edema or pain. ?orthopnea. No pnd.  Hx mild AS and diastolic dysfunction.    Past Medical History  Diagnosis Date  . Hypertension   . Hyperlipidemia   . Tachycardia     history of tachycardia and bradycardia  . Hypothyroidism   . Dyspnea   . Diastolic dysfunction     mild   . Mild mitral regurgitation by prior echocardiogram   . Bradycardia     hospitalized for   . Arthritis    Past Surgical History  Procedure Laterality Date  . Total abdominal hysterectomy    . Cholecystectomy    . Extracorporeal shock wave lithotripsy    . Tonsillectomy     Family History  Problem Relation Age of Onset  . Emphysema Father   . Heart failure Mother   . Heart attack Brother   . Cancer Brother     kidney  . Stroke Brother    History  Substance Use Topics  . Smoking status: Never Smoker   . Smokeless tobacco: Never Used  . Alcohol Use: No   OB History   Grav Para Term Preterm Abortions TAB SAB Ect Mult Living                 Review of Systems  Constitutional: Negative for fever and chills.  HENT: Negative for sore throat.   Eyes: Negative for redness.   Respiratory: Positive for shortness of breath. Negative for cough.   Cardiovascular: Negative for chest pain, palpitations and leg swelling.  Gastrointestinal: Negative for nausea, vomiting and abdominal pain.  Genitourinary: Negative for flank pain.  Musculoskeletal: Negative for back pain and neck pain.  Skin: Negative for rash.  Neurological: Negative for headaches.  Hematological: Does not bruise/bleed easily.  Psychiatric/Behavioral: Negative for confusion.      Allergies  Review of patient's allergies indicates no known allergies.  Home Medications   Prior to Admission medications   Medication Sig Start Date End Date Taking? Authorizing Provider  ALPRAZolam (XANAX) 0.25 MG tablet Take 0.125 mg by mouth daily as needed for anxiety.     Historical Provider, MD  amLODipine (NORVASC) 5 MG tablet Take 1 tablet (5 mg total) by mouth every evening. 11/01/12   Thayer Headings, MD  aspirin 81 MG tablet Take 20.25 mg by mouth daily. Takes 1/4 of an aspirin=20.25mg     Historical Provider, MD  levothyroxine (SYNTHROID, LEVOTHROID) 25 MCG tablet Take 25 mcg by mouth daily before breakfast.     Historical Provider, MD  Multiple Vitamin (MULTIVITAMIN WITH MINERALS) TABS tablet Take 1 tablet by mouth daily.    Historical Provider, MD  Multiple Vitamins-Minerals (  PRESERVISION AREDS) CAPS Take 1 capsule by mouth 2 (two) times daily.    Historical Provider, MD  nebivolol (BYSTOLIC) 5 MG tablet Take 1 tablet (5 mg total) by mouth every evening. 09/21/13   Thayer Headings, MD  pravastatin (PRAVACHOL) 20 MG tablet Take 20 mg by mouth every evening.     Historical Provider, MD  sertraline (ZOLOFT) 25 MG tablet Take 25 mg by mouth every morning.  08/15/13   Historical Provider, MD   BP 162/47  Pulse 42  Temp(Src) 97 F (36.1 C) (Oral)  Resp 20  SpO2 99% Physical Exam  Nursing note and vitals reviewed. Constitutional: She is oriented to person, place, and time. She appears well-developed and  well-nourished. No distress.  HENT:  Mouth/Throat: Oropharynx is clear and moist.  Eyes: No scleral icterus.  Style left lower eye lid, conj inj.  Neck: Neck supple. No tracheal deviation present.  Cardiovascular: Regular rhythm and intact distal pulses.   Murmur heard. Pulmonary/Chest: Effort normal. No respiratory distress.  Basilar rales  Abdominal: Soft. Normal appearance and bowel sounds are normal. She exhibits no distension. There is no tenderness.  Genitourinary:  No cva tenderness  Musculoskeletal: She exhibits no edema and no tenderness.  Neurological: She is alert and oriented to person, place, and time.  Skin: Skin is warm and dry. No rash noted.  Psychiatric: She has a normal mood and affect.    ED Course  Procedures (including critical care time) Labs Review  Results for orders placed during the hospital encounter of 09/24/13  CBC      Result Value Ref Range   WBC 10.8 (*) 4.0 - 10.5 K/uL   RBC 4.22  3.87 - 5.11 MIL/uL   Hemoglobin 13.2  12.0 - 15.0 g/dL   HCT 39.2  36.0 - 46.0 %   MCV 92.9  78.0 - 100.0 fL   MCH 31.3  26.0 - 34.0 pg   MCHC 33.7  30.0 - 36.0 g/dL   RDW 14.2  11.5 - 15.5 %   Platelets 327  150 - 400 K/uL  COMPREHENSIVE METABOLIC PANEL      Result Value Ref Range   Sodium 141  137 - 147 mEq/L   Potassium 4.4  3.7 - 5.3 mEq/L   Chloride 105  96 - 112 mEq/L   CO2 23  19 - 32 mEq/L   Glucose, Bld 91  70 - 99 mg/dL   BUN 14  6 - 23 mg/dL   Creatinine, Ser 0.73  0.50 - 1.10 mg/dL   Calcium 9.5  8.4 - 10.5 mg/dL   Total Protein 7.5  6.0 - 8.3 g/dL   Albumin 3.5  3.5 - 5.2 g/dL   AST 16  0 - 37 U/L   ALT 12  0 - 35 U/L   Alkaline Phosphatase 90  39 - 117 U/L   Total Bilirubin 0.4  0.3 - 1.2 mg/dL   GFR calc non Af Amer 73 (*) >90 mL/min   GFR calc Af Amer 85 (*) >90 mL/min   Anion gap 13  5 - 15  TROPONIN I      Result Value Ref Range   Troponin I <0.30  <0.30 ng/mL  PRO B NATRIURETIC PEPTIDE      Result Value Ref Range   Pro B  Natriuretic peptide (BNP) 818.9 (*) 0 - 450 pg/mL  URINALYSIS, ROUTINE W REFLEX MICROSCOPIC      Result Value Ref Range   Color, Urine AMBER (*)  YELLOW   APPearance CLOUDY (*) CLEAR   Specific Gravity, Urine 1.019  1.005 - 1.030   pH 6.0  5.0 - 8.0   Glucose, UA NEGATIVE  NEGATIVE mg/dL   Hgb urine dipstick SMALL (*) NEGATIVE   Bilirubin Urine NEGATIVE  NEGATIVE   Ketones, ur NEGATIVE  NEGATIVE mg/dL   Protein, ur NEGATIVE  NEGATIVE mg/dL   Urobilinogen, UA 0.2  0.0 - 1.0 mg/dL   Nitrite NEGATIVE  NEGATIVE   Leukocytes, UA LARGE (*) NEGATIVE  URINE MICROSCOPIC-ADD ON      Result Value Ref Range   Squamous Epithelial / LPF FEW (*) RARE   WBC, UA 11-20  <3 WBC/hpf   RBC / HPF 0-2  <3 RBC/hpf   Bacteria, UA MANY (*) RARE   Urine-Other MUCOUS PRESENT     Dg Chest 2 View  09/24/2013   CLINICAL DATA:  Irregular EKG.  Hypertension.  Chest pain.  EXAM: CHEST  2 VIEW  COMPARISON:  09/02/2013.  FINDINGS: Cardiopericardial silhouette within normal limits. Mediastinal contours normal. Aortic arch atherosclerosis. Diffusely increased interstitial markings are present, most compatible with interstitial pulmonary edema. There is no pleural effusion.  IMPRESSION: Nonspecific diffuse interstitial prominence, most compatible with interstitial edema.   Electronically Signed   By: Dereck Ligas M.D.   On: 09/24/2013 14:13   Dg Chest Port 1 View  09/02/2013   CLINICAL DATA:  Chest pain  EXAM: PORTABLE CHEST - 1 VIEW  COMPARISON:  February 02, 2012  FINDINGS: There is no edema or consolidation. Heart size and pulmonary vascularity are normal. No adenopathy. No pneumothorax. No bone lesions.  IMPRESSION: No edema or consolidation.   Electronically Signed   By: Lowella Grip M.D.   On: 09/02/2013 13:55      EKG Interpretation   Date/Time:  Saturday September 24 2013 11:44:45 EDT Ventricular Rate:  105 PR Interval:  155 QRS Duration: 93 QT Interval:  378 QTC Calculation: 500 R Axis:   75 Text  Interpretation:  Sinus tachycardia Ventricular bigeminy `frequent  pvcs/bigeminy new from prior, no acute st/t changes Confirmed by Ashok Cordia   MD, Terrel Manalo ([id number]) on 09/24/2013 11:48:39 AM      MDM  Iv ns. Continuous pulse ox and monitor. o2 [place].  Labs. Cxr.  Reviewed nursing notes and prior charts for additional history.   ua positive, u cx sent. Rocephin iv.   Chf/vasc congestion on xr, elev bnp, lasix iv.  Cardiology consulted re chf, frequent pvc/bigeminy (pulse rate 40's) - discussed pt - they will see/consult.   Medical service contacted for admission.      Mirna Mires, MD 09/24/13 [phone]

## 2013-09-24 NOTE — ED Provider Notes (Signed)
Angiocath insertion Performed by: Tammy Sours  Consent: Verbal consent obtained. Risks and benefits: risks, benefits and alternatives were discussed Time out: Immediately prior to procedure a "time out" was called to verify the correct patient, procedure, equipment, support staff and site/side marked as required.  Preparation: Patient was prepped and draped in the usual sterile fashion.  Vein Location: Right brachial  Ultrasound Guided  Gauge: 18  Attempts 1  Image archived  Normal blood return and flush without difficulty Patient tolerance: Patient tolerated the procedure well with no immediate complications.     Tammy Sours, MD 09/24/13 1520

## 2013-09-25 DIAGNOSIS — I10 Essential (primary) hypertension: Secondary | ICD-10-CM

## 2013-09-25 LAB — BASIC METABOLIC PANEL
ANION GAP: 16 — AB (ref 5–15)
BUN: 15 mg/dL (ref 6–23)
CALCIUM: 9.4 mg/dL (ref 8.4–10.5)
CO2: 23 mEq/L (ref 19–32)
CREATININE: 0.8 mg/dL (ref 0.50–1.10)
Chloride: 101 mEq/L (ref 96–112)
GFR, EST AFRICAN AMERICAN: 73 mL/min — AB (ref >90)
GFR, EST NON AFRICAN AMERICAN: 63 mL/min — AB (ref >90)
Glucose, Bld: 96 mg/dL (ref 70–99)
Potassium: 4 mEq/L (ref 3.7–5.3)
SODIUM: 140 meq/L (ref 137–147)

## 2013-09-25 LAB — TROPONIN I: Troponin I: <0.3 ng/mL (ref <0.30)

## 2013-09-25 LAB — URINE CULTURE

## 2013-09-25 LAB — TSH: TSH: 8.17 u[IU]/mL — AB (ref 0.350–4.500)

## 2013-09-25 MED ORDER — LEVOTHYROXINE SODIUM 50 MCG PO TABS
50.0000 ug | ORAL_TABLET | Freq: Every day | ORAL | Status: DC
  Administered 2013-09-26 – 2013-09-27 (×2): 50 ug via ORAL
  Filled 2013-09-25 (×3): qty 1

## 2013-09-25 NOTE — Progress Notes (Signed)
Utilization review completed.  

## 2013-09-25 NOTE — ED Provider Notes (Addendum)
I saw and evaluated the patient, reviewed the resident's note and I agree with the findings and plan.  See h and p by me, pt primarily seen by me/ED attending.  I was present during and supervised residents u/s guided iv placement.  Procedure completed without complication.     EKG Interpretation   Date/Time:  Saturday September 24 2013 11:44:45 EDT Ventricular Rate:  105 PR Interval:  155 QRS Duration: 93 QT Interval:  378 QTC Calculation: 500 R Axis:   75 Text Interpretation:  Sinus tachycardia Ventricular bigeminy `frequent  pvcs/bigeminy new from prior, no acute st/t changes Confirmed by Ashok Cordia   MD, Lennette Bihari ([id number]) on 09/24/2013 11:48:39 AM         Mirna Mires, MD 10/11/13 1134

## 2013-09-25 NOTE — Progress Notes (Signed)
[facility] PROGRESS NOTE  [name] VOJ:[id number] DOB: [birth year] DOA: 09/24/2013 PCP: [name], MD  Assessment/Plan: 1. Acute on chronic diastolic congestive heart failure -Patient presenting with symptoms consistent with acute CHF -Transthoracic echocardiogram performed on 09/01/2013 showing ejection fraction of 55-60% with grade 1 diastolic dysfunction -Cardiology consulted -Continue Lasix 40 mg IV twice a day -Has a net negative fluid balance of 1.47 L, weight of 68.3 kg down from 69.03 on 09/24/2013 -Continue IV diuresis  2.  Urinary tract infection - Urinalysis on admission showing the presence of many bacteria with leukocytes. -She was started on ceftriaxone 1 g IV every 24 hours, followup on urine cultures which are currently pending  3.  Hypothyroidism -TSH , back elevated at 8.17 -Will increase her Synthroid from 25-50 mcg by mouth daily -Will need repeat TSH in 4-6 weeks  4.  Hypertension. -Blood pressure stable, continue amlodipine 5 mg by mouth daily and nebivolol  Code Status: Full Code Family Communication:  Disposition Plan: Continue IV diuresis   Consultants:  Cardiology   Antibiotics:  Ceftriaxone 1 g IV every 24 hours  HPI/Subjective: Patient is a 80 year old female with a past medical history of diastolic congestive heart failure, admitted to the medicine service on 09/24/2013. She presented with complaints of increasing dyspnea to 3 days prior to presentation. Initial chest x-ray showed nonspecific diffuse interstitial prominence, most compatible with interstitial edema. Symptoms attributed to acute on chronic diastolic congestive heart failure as she was started on IV Lasix. Because of history of bradycardia with frequent PVCs EP consulted.  Objective: Filed Vitals:   09/25/13 1300  BP: 121/47  Pulse: 72  Temp: 97.7 F (36.5 C)  Resp: 16    Intake/Output Summary (Last 24 hours) at 09/25/13 1640 Last data filed at  09/25/13 1615  Gross per 24 hour  Intake    680 ml  Output   2150 ml  Net  -1470 ml   Filed Weights   09/24/13 1705 09/25/13 0555  Weight: 69.037 kg (152 lb 3.2 oz) 68.3 kg (150 lb 9.2 oz)    Exam:   General:  Patient is awake alert, states all better today  Cardiovascular: Irregular rate and rhythm normal S1-S2 no murmurs rubs gallops  Respiratory: On supplemental oxygen, she has a few bibasilar crackles  Abdomen: Soft nontender nondistended positive bowel  Musculoskeletal: Positive edema to lower extremities bilaterally  Data Reviewed: Basic Metabolic Panel:  Recent Labs Lab 09/24/13 1140 09/24/13 1912 09/25/13 0720  NA 141  --  140  K 4.4  --  4.0  CL 105  --  101  CO2 23  --  23  GLUCOSE 91  --  96  BUN 14  --  15  CREATININE 0.73 0.81 0.80  CALCIUM 9.5  --  9.4   Liver Function Tests:  Recent Labs Lab 09/24/13 1140  AST 16  ALT 12  ALKPHOS 90  BILITOT 0.4  PROT 7.5  ALBUMIN 3.5   No results found for this basename: LIPASE, AMYLASE,  in the last 168 hours No results found for this basename: AMMONIA,  in the last 168 hours CBC:  Recent Labs Lab 09/24/13 1140 09/24/13 1912  WBC 10.8* 10.1  HGB 13.2 13.0  HCT 39.2 38.3  MCV 92.9 91.8  PLT 327 331   Cardiac Enzymes:  Recent Labs Lab 09/24/13 1140 09/24/13 1912 09/24/13 2300 09/25/13 0720  TROPONINI <0.30 <0.30 <0.30 <0.30   BNP (last 3 results)  Recent Labs  09/02/13 1227  09/24/13 1140  PROBNP 149.9 818.9*   CBG: No results found for this basename: GLUCAP,  in the last 168 hours  No results found for this or any previous visit (from the past 240 hour(s)).   Studies: Dg Chest 2 View  09/24/2013   CLINICAL DATA:  Irregular EKG.  Hypertension.  Chest pain.  EXAM: CHEST  2 VIEW  COMPARISON:  09/02/2013.  FINDINGS: Cardiopericardial silhouette within normal limits. Mediastinal contours normal. Aortic arch atherosclerosis. Diffusely increased interstitial markings are present, most  compatible with interstitial pulmonary edema. There is no pleural effusion.  IMPRESSION: Nonspecific diffuse interstitial prominence, most compatible with interstitial edema.   Electronically Signed   By: Dereck Ligas M.D.   On: 09/24/2013 14:13    Scheduled Meds: . amLODipine  5 mg Oral QPM  . aspirin  20.25 mg Oral Daily  . cefTRIAXone (ROCEPHIN)  IV  1 g Intravenous Q24H  . cholecalciferol  1,000 Units Oral QPM  . enoxaparin (LOVENOX) injection  40 mg Subcutaneous Q24H  . furosemide  20 mg Intravenous Once  . furosemide  40 mg Intravenous Q12H  . [START ON 09/26/2013] levothyroxine  50 mcg Oral QAC breakfast  . multivitamin with minerals  1 tablet Oral Daily  . multivitamin-lutein  1 capsule Oral BID  . nebivolol  10 mg Oral Daily  . sertraline  25 mg Oral q morning - 10a  . simvastatin  10 mg Oral q1800  . sodium chloride  3 mL Intravenous Q12H  . tobramycin  1 drop Left Eye 4 times per day   Continuous Infusions: . sodium chloride 20 mL/hr at 09/24/13 1522    Principal Problem:   Acute on chronic diastolic congestive heart failure Active Problems:   HTN (hypertension)   Hypothyroidism   UTI (urinary tract infection)   Abnormal EKG   Infection of left eye    Time spent: 35 min    [name]  [facility] Pager [phone]. If 7PM-7AM, please contact night-coverage at [email or web address], password [email or web address] 09/25/2013, 4:40 PM  LOS: 1 day

## 2013-09-25 NOTE — Progress Notes (Signed)
Subjective:  80 year old female with mild aortic stenosis, diastolic dysfunction, frequent PVCs.  She is still short of breath with activity. She is upset about blood draws.  Objective:  Vital Signs in the last 24 hours: Temp:  [97 F (36.1 C)-98.1 F (36.7 C)] 98.1 F (36.7 C) (09/13 0555) Pulse Rate:  [32-93] 78 (09/13 0555) Resp:  [13-24] 20 (09/13 0555) BP: (101-162)/(33-78) 101/44 mmHg (09/13 0555) SpO2:  [96 %-100 %] 100 % (09/13 0555) FiO2 (%):  [2 %] 2 % (09/12 1705) Weight:  [150 lb 9.2 oz (68.3 kg)-152 lb 3.2 oz (69.037 kg)] 150 lb 9.2 oz (68.3 kg) (09/13 0555)  Intake/Output from previous day: 09/12 0701 - 09/13 0700 In: 240 [P.O.:240] Out: 1100 [Urine:1100]   Physical Exam: General: Well developed, well nourished, in no acute distress. Looks younger than stated age Head:  Normocephalic and atraumatic. Lungs: Mild wheeze bilaterally bases posterior. Normal respiratory effort however this increased when she moved from chair. Heart: Normal S1 and S2. Occasional ectopy  2/6 systolic right upper sternal border murmur, no rubs or gallops.  Abdomen: soft, non-tender, positive bowel sounds. Extremities: No clubbing or cyanosis. No edema. Neurologic: Alert and oriented x 3.    Lab Results:  Recent Labs  09/24/13 1140 09/24/13 1912  WBC 10.8* 10.1  HGB 13.2 13.0  PLT 327 331    Recent Labs  09/24/13 1140 09/24/13 1912 09/25/13 0720  NA 141  --  140  K 4.4  --  4.0  CL 105  --  101  CO2 23  --  23  GLUCOSE 91  --  96  BUN 14  --  15  CREATININE 0.73 0.81 0.80    Recent Labs  09/24/13 2300 09/25/13 0720  TROPONINI <0.30 <0.30   Hepatic Function Panel  Recent Labs  09/24/13 1140  PROT 7.5  ALBUMIN 3.5  AST 16  ALT 12  ALKPHOS 90  BILITOT 0.4    Imaging: Dg Chest 2 View  09/24/2013   CLINICAL DATA:  Irregular EKG.  Hypertension.  Chest pain.  EXAM: CHEST  2 VIEW  COMPARISON:  09/02/2013.  FINDINGS: Cardiopericardial silhouette within  normal limits. Mediastinal contours normal. Aortic arch atherosclerosis. Diffusely increased interstitial markings are present, most compatible with interstitial pulmonary edema. There is no pleural effusion.  IMPRESSION: Nonspecific diffuse interstitial prominence, most compatible with interstitial edema.   Electronically Signed   By: Dereck Ligas M.D.   On: 09/24/2013 14:13   Personally viewed.   Telemetry: Frequent PVCs, occasional trigeminy. Personally viewed.   EKG:  Ventricular bigeminy  Cardiac Studies:  Echocardiogram 09/03/13-normal EF, mild aortic stenosis, mild LVH, grade 1 diastolic dysfunction  Assessment/Plan:  Principal Problem:   Acute on chronic diastolic congestive heart failure Active Problems:   HTN (hypertension)   Hypothyroidism   UTI (urinary tract infection)   Abnormal EKG   Infection of left eye  1. Acute on chronic diastolic heart failure  - She is out approximately 0.5 L. Creatinine is 0.8  - Agree with Lasix 40 mg IV every 12 hours  - Continue with Bystolic 10 mg once a day  - Continued wheeze. Getting ceftriaxone for UTI.  2. Frequent PVCs  - I agree with prior consult note that her frequent PVCs were not likely picked up by electronically pressure cuff and therefore bradycardia was expressed. Artifactual.  - Continue with beta blocker.  3. Mild aortic stenosis-should be of no clinical consequence.  We will follow.   Clista Rainford,  [place] 09/25/2013, 10:22 AM

## 2013-09-26 DIAGNOSIS — I5030 Unspecified diastolic (congestive) heart failure: Secondary | ICD-10-CM

## 2013-09-26 DIAGNOSIS — N390 Urinary tract infection, site not specified: Secondary | ICD-10-CM

## 2013-09-26 LAB — BASIC METABOLIC PANEL
Anion gap: 15 (ref 5–15)
BUN: 20 mg/dL (ref 6–23)
CALCIUM: 9 mg/dL (ref 8.4–10.5)
CO2: 23 mEq/L (ref 19–32)
CREATININE: 0.85 mg/dL (ref 0.50–1.10)
Chloride: 100 mEq/L (ref 96–112)
GFR, EST AFRICAN AMERICAN: 68 mL/min — AB (ref >90)
GFR, EST NON AFRICAN AMERICAN: 59 mL/min — AB (ref >90)
GLUCOSE: 108 mg/dL — AB (ref 70–99)
Potassium: 3.9 mEq/L (ref 3.7–5.3)
Sodium: 138 mEq/L (ref 137–147)

## 2013-09-26 LAB — CBC
HCT: 38.8 % (ref 36.0–46.0)
Hemoglobin: 13.1 g/dL (ref 12.0–15.0)
MCH: 31 pg (ref 26.0–34.0)
MCHC: 33.8 g/dL (ref 30.0–36.0)
MCV: 91.7 fL (ref 78.0–100.0)
PLATELETS: 310 10*3/uL (ref 150–400)
RBC: 4.23 MIL/uL (ref 3.87–5.11)
RDW: 14.1 % (ref 11.5–15.5)
WBC: 9.8 10*3/uL (ref 4.0–10.5)

## 2013-09-26 MED ORDER — LEVALBUTEROL HCL 0.63 MG/3ML IN NEBU
0.6300 mg | INHALATION_SOLUTION | Freq: Three times a day (TID) | RESPIRATORY_TRACT | Status: DC
  Administered 2013-09-26: 0.63 mg via RESPIRATORY_TRACT
  Filled 2013-09-26: qty 3

## 2013-09-26 NOTE — Progress Notes (Signed)
Subjective:  80 year old female with mild aortic stenosis, diastolic dysfunction, frequent PVCs.  She is still short of breath with activity. She is upset about blood draws, but she said that lady this morning did a great job.  Objective:  Vital Signs in the last 24 hours: Temp:  [97.7 F (36.5 C)-98.1 F (36.7 C)] 98 F (36.7 C) (09/14 0612) Pulse Rate:  [64-79] 79 (09/14 1105) Resp:  [16-18] 17 (09/14 0612) BP: (98-121)/(40-60) 102/60 mmHg (09/14 1105) SpO2:  [97 %-99 %] 97 % (09/14 0612) Weight:  [149 lb 0.5 oz (67.6 kg)] 149 lb 0.5 oz (67.6 kg) (09/14 0612)  Intake/Output from previous day: 09/13 0701 - 09/14 0700 In: 610 [P.O.:610] Out: 2375 [Urine:2375]   Physical Exam: General: Well developed, well nourished, in no acute distress. Looks younger than stated age Head:  Normocephalic and atraumatic. Lungs: Mild wheeze bilaterally bases posterior. Normal respiratory effort however this increased when she moved from chair. Heart: Normal S1 and S2. Occasional ectopy  2/6 systolic right upper sternal border murmur, no rubs or gallops.  Abdomen: soft, non-tender, positive bowel sounds. Extremities: No clubbing or cyanosis. No edema. Neurologic: Alert and oriented x 3.    Lab Results:  Recent Labs  09/24/13 1912 09/26/13 0424  WBC 10.1 9.8  HGB 13.0 13.1  PLT 331 310    Recent Labs  09/25/13 0720 09/26/13 0424  NA 140 138  K 4.0 3.9  CL 101 100  CO2 23 23  GLUCOSE 96 108*  BUN 15 20  CREATININE 0.80 0.85    Recent Labs  09/24/13 2300 09/25/13 0720  TROPONINI <0.30 <0.30   Hepatic Function Panel  Recent Labs  09/24/13 1140  PROT 7.5  ALBUMIN 3.5  AST 16  ALT 12  ALKPHOS 90  BILITOT 0.4    Imaging: Dg Chest 2 View  09/24/2013   CLINICAL DATA:  Irregular EKG.  Hypertension.  Chest pain.  EXAM: CHEST  2 VIEW  COMPARISON:  09/02/2013.  FINDINGS: Cardiopericardial silhouette within normal limits. Mediastinal contours normal. Aortic arch  atherosclerosis. Diffusely increased interstitial markings are present, most compatible with interstitial pulmonary edema. There is no pleural effusion.  IMPRESSION: Nonspecific diffuse interstitial prominence, most compatible with interstitial edema.   Electronically Signed   By: Dereck Ligas M.D.   On: 09/24/2013 14:13   Personally viewed.   Telemetry: Frequent PVCs, occasional trigeminy. Personally viewed.   EKG:  Ventricular bigeminy  Cardiac Studies:  Echocardiogram 09/03/13-normal EF, mild aortic stenosis, mild LVH, grade 1 diastolic dysfunction  Assessment/Plan:  Principal Problem:   Acute on chronic diastolic congestive heart failure Active Problems:   HTN (hypertension)   Hypothyroidism   UTI (urinary tract infection)   Abnormal EKG   Infection of left eye  1. Acute on chronic diastolic heart failure  - She is out approximately 0.5 L. Creatinine is 0.8  - Agree with Lasix 40 mg IV every 12 hours  - Continue with Bystolic 10 mg once a day  - Continued wheeze. Getting ceftriaxone for UTI.  - Perhaps albuterol would help. Will defer to Dr. Coralyn Pear. I am not sure that this is completely a "cardiac wheeze".   2. Frequent PVCs  - I agree with prior consult note that her frequent PVCs were not likely picked up by electronically pressure cuff and therefore bradycardia was expressed. Artifactual.  - Continue with beta blocker.   3. Mild aortic stenosis-should be of no clinical consequence.  4. Hypertension - mild hypotension this  AM. I stopped amlodipine and continued Bystolic 10.   Dispo - Maybe tomorrow?  We will follow.   Harjit Douds, [place] 09/26/2013, 12:02 PM

## 2013-09-26 NOTE — Progress Notes (Signed)
[facility] PROGRESS NOTE  [name] DPO:[id number] DOB: September 25, 1921 DOA: 09/24/2013 PCP: [name], MD  Assessment/Plan:  Acute on chronic diastolic congestive heart failure -Patient presenting with symptoms consistent with acute CHF, unclear if PVCs are a contributing factor -Transthoracic echocardiogram performed on 09/01/2013 showing ejection fraction of 55-60% with grade 1 diastolic dysfunction  -Has a net negative fluid balance of -2.5 L, weight of 67.6 kg down from 68.03 on 09/24/2013 -Continue Lasix 40 mg IV twice a day  -Cardiology following- patient is wheezing and thus questionable pulmonary component in addition to CHF exacerbation. Added xopenex neb  Urinary tract infection  - Urinalysis on admission showing the presence of many bacteria with leukocytes.  -She was started on ceftriaxone 1 g IV every 24 hours ( day 2) -urine culture pending  Hypothyroidism  -TSH , back elevated at 8.17  -Will increase her Synthroid from 25-50 mcg by mouth daily  -Will need repeat TSH in 4-6 weeks   Hypotension -stable --per cardiology- discontinue amlodipine and continue Bystolic -continue to monitor  Hypertension.  -stable- episodes of hypotension -Appreciate cardiology following- discontinue amlodipine 5 mg; continue Bystolic to help suppress PVCs  Frequent PVCs -Stable -Appreciate cardiology following- continue Bystolic to suppress PVCs  Mild aortic stenosis -cardiology following- mild and of no clinical significance   Infection of left eye -stable- states improvement in erythema -continue Tobramycin  Hyperlipidemia -continue home regimen of simvastatin  Depression -continue home regimen of zoloft DVT Prophylaxis:  lovenox Pray Code Status: Full Family Communication: No family at bedside Disposition Plan: Inpatient; SNF when stable   Consultants:  Cardiology  Procedures:  None  Antibiotics:  IV rocephin (day 2)  HPI This is a 80  y.o. female with PMH of mild aortic stenosis, diastolic dysfunction ( EF 55-65%) , history of bradycardia, frequent PVCs,  hypertension, hypothyroidism, hyperlipidemia, and recent admission (discharged 8/22) for dyspnea on exertion,  presents with worsening exertional dyspnea for the past 3 days.  She states symptoms now occurring even when she performs activities of daily living . She denies current chest pain but reports history of chest pain during hospitalization about 3 weeks ago.   She denies syncope, palpitaiton, recent weight change, or any change in medications. In the emergency room she is found to have bigeminy PVCs with heart ratearound 40. Holter monitor  performed in 2014 that showed frequent PVCs that are monomorphic and identical with the one seen today.  Subjective: continued SOB with activity.    Objective: Filed Vitals:   09/26/13 1105  BP: 102/60  Pulse: 79  Temp:   Resp:     Intake/Output Summary (Last 24 hours) at 09/26/13 1226 Last data filed at 09/26/13 4315  Gross per 24 hour  Intake    730 ml  Output   1826 ml  Net  -1096 ml   Filed Weights   09/24/13 1705 09/25/13 0555 09/26/13 0612  Weight: 69.037 kg (152 lb 3.2 oz) 68.3 kg (150 lb 9.2 oz) 67.6 kg (149 lb 0.5 oz)    Exam:  Gen: Alert and oriented pleasant caucasian femal in NAD, appears less than stated age.  HEENT: Normocephalic, atraumatic.  Pupils symmertrical.  Moist mucosa.   Chest: good air movement bilaterally. Expiratory wheeze in posterior left lung field. Cardiac: Regular rate and rhythm, S1-S2, no rubs murmurs or gallops  Abdomen: soft, non tender, non distended, +bowel sounds. No guarding or rigidity  Extremities: Symmetrical in appearance without cyanosis or edema. Left arm with multiple bruised areas  from IV attempts Neurological: Alert awake oriented to time place and person.  Psychiatric: Appears normal.   Data Reviewed: Basic Metabolic Panel:  Recent Labs Lab 09/24/13 1140  09/24/13 1912 09/25/13 0720 09/26/13 0424  NA 141  --  140 138  K 4.4  --  4.0 3.9  CL 105  --  101 100  CO2 23  --  23 23  GLUCOSE 91  --  96 108*  BUN 14  --  15 20  CREATININE 0.73 0.81 0.80 0.85  CALCIUM 9.5  --  9.4 9.0   Liver Function Tests:  Recent Labs Lab 09/24/13 1140  AST 16  ALT 12  ALKPHOS 90  BILITOT 0.4  PROT 7.5  ALBUMIN 3.5   No results found for this basename: LIPASE, AMYLASE,  in the last 168 hours No results found for this basename: AMMONIA,  in the last 168 hours CBC:  Recent Labs Lab 09/24/13 1140 09/24/13 1912 09/26/13 0424  WBC 10.8* 10.1 9.8  HGB 13.2 13.0 13.1  HCT 39.2 38.3 38.8  MCV 92.9 91.8 91.7  PLT 327 331 310   Cardiac Enzymes:  Recent Labs Lab 09/24/13 1140 09/24/13 1912 09/24/13 2300 09/25/13 0720  TROPONINI <0.30 <0.30 <0.30 <0.30   BNP (last 3 results)  Recent Labs  09/02/13 1227 09/24/13 1140  PROBNP 149.9 818.9*   CBG: No results found for this basename: GLUCAP,  in the last 168 hours  Recent Results (from the past 240 hour(s))  URINE CULTURE     Status: None   Collection Time    09/24/13  1:15 PM      Result Value Ref Range Status   Specimen Description URINE, RANDOM   Final   Special Requests NONE   Final   Culture  Setup Time     Final   Value: 09/24/2013 17:11     Performed at [facility]     Final   Value: >=100,000 COLONIES/ML     Performed at [facility]   Culture     Final   Value: Multiple bacterial morphotypes present, none predominant. Suggest appropriate recollection if clinically indicated.     Performed at [facility]   Report Status 09/25/2013 FINAL   Final     Studies: Dg Chest 2 View  09/24/2013   CLINICAL DATA:  Irregular EKG.  Hypertension.  Chest pain.  EXAM: CHEST  2 VIEW  COMPARISON:  09/02/2013.  FINDINGS: Cardiopericardial silhouette within normal limits. Mediastinal contours normal. Aortic arch atherosclerosis. Diffusely increased  interstitial markings are present, most compatible with interstitial pulmonary edema. There is no pleural effusion.  IMPRESSION: Nonspecific diffuse interstitial prominence, most compatible with interstitial edema.   Electronically Signed   By: Dereck Ligas M.D.   On: 09/24/2013 14:13    Scheduled Meds: . aspirin  20.25 mg Oral Daily  . cefTRIAXone (ROCEPHIN)  IV  1 g Intravenous Q24H  . cholecalciferol  1,000 Units Oral QPM  . enoxaparin (LOVENOX) injection  40 mg Subcutaneous Q24H  . furosemide  20 mg Intravenous Once  . furosemide  40 mg Intravenous Q12H  . levalbuterol  0.63 mg Nebulization Q8H  . levothyroxine  50 mcg Oral QAC breakfast  . multivitamin with minerals  1 tablet Oral Daily  . multivitamin-lutein  1 capsule Oral BID  . nebivolol  10 mg Oral Daily  . sertraline  25 mg Oral q morning - 10a  . simvastatin  10 mg  Oral q1800  . sodium chloride  3 mL Intravenous Q12H  . tobramycin  1 drop Left Eye 4 times per day   Continuous Infusions: . sodium chloride 20 mL/hr at 09/24/13 1522    Principal Problem:   Acute on chronic diastolic congestive heart failure Active Problems:   HTN (hypertension)   Hypothyroidism   UTI (urinary tract infection)   Abnormal EKG   Infection of left eye    Time spent: 35 min   Lacy Duverney [facility]  [facility] Pager [phone]. If 7PM-7AM, please contact night-coverage at [email or web address], password [facility] 09/26/2013, 12:26 PM  LOS: 2 days     Addendum I personally saw and evaluated patient and agree with the above findings. She was admitted for acute CHF, currently being diuresed with IV lasix. Although she states feeling better is not quite at her baseline as she continues to have dyspnea on exertion. Will continue to IV lasix, reassess volume status in AM. Urine culture growing multiple bacterial morphotypes present, none predominant, on IV rocephin.

## 2013-09-26 NOTE — Progress Notes (Signed)
CARDIAC REHAB PHASE I   PRE:  Rate/Rhythm: 64 Jr? (no PVCs)    BP: sitting 117/44    SaO2: 97 RA  MODE:  Ambulation: 460 ft   POST:  Rate/Rhythm: 95 with occ PVCs    BP: sitting 98/80     SaO2: 100 RA  Pt denies SOB at rest but very SOB with walking about 150 ft. Breathless. PVCs noted on small telebox. On return to room PVCs diminished and SOB eventually ceased as well. Pt frustrated. Very steady, independent other than SOB. [id number]   [name] CES, ACSM 09/26/2013 3:30 PM

## 2013-09-26 NOTE — Progress Notes (Signed)
BP 98/40. DC'd amlodipine 5mg .  Continue with Bystolic to help suppress PVCs.  Candee Furbish, MD

## 2013-09-26 NOTE — Progress Notes (Signed)
Alert and oriented x 3.  Patient able to ambulate with supervision. Walked in hallway with PT. Continues to complain of SOB at rest. Denies pain and discomfort.

## 2013-09-27 DIAGNOSIS — I4949 Other premature depolarization: Secondary | ICD-10-CM

## 2013-09-27 DIAGNOSIS — N3000 Acute cystitis without hematuria: Secondary | ICD-10-CM

## 2013-09-27 LAB — BASIC METABOLIC PANEL
ANION GAP: 17 — AB (ref 5–15)
BUN: 21 mg/dL (ref 6–23)
CALCIUM: 9.1 mg/dL (ref 8.4–10.5)
CO2: 25 meq/L (ref 19–32)
Chloride: 99 mEq/L (ref 96–112)
Creatinine, Ser: 0.83 mg/dL (ref 0.50–1.10)
GFR calc Af Amer: 70 mL/min — ABNORMAL LOW (ref >90)
GFR calc non Af Amer: 60 mL/min — ABNORMAL LOW (ref >90)
Glucose, Bld: 106 mg/dL — ABNORMAL HIGH (ref 70–99)
Potassium: 3.5 mEq/L — ABNORMAL LOW (ref 3.7–5.3)
SODIUM: 141 meq/L (ref 137–147)

## 2013-09-27 MED ORDER — LEVALBUTEROL HCL 0.63 MG/3ML IN NEBU: 0.6300 mg | INHALATION_SOLUTION | Freq: Four times a day (QID) | RESPIRATORY_TRACT | Status: DC | PRN

## 2013-09-27 MED ORDER — NEBIVOLOL HCL 10 MG PO TABS: 10.0000 mg | ORAL_TABLET | Freq: Every day | ORAL | Status: DC

## 2013-09-27 MED ORDER — FUROSEMIDE 20 MG PO TABS: 20.0000 mg | ORAL_TABLET | Freq: Every day | ORAL | Status: DC | PRN

## 2013-09-27 MED ORDER — FUROSEMIDE 20 MG PO TABS
20.0000 mg | ORAL_TABLET | Freq: Every day | ORAL | Status: DC | PRN
  Filled 2013-09-27: qty 1

## 2013-09-27 MED ORDER — LEVOTHYROXINE SODIUM 50 MCG PO TABS: 50.0000 ug | ORAL_TABLET | Freq: Every day | ORAL | Status: DC

## 2013-09-27 MED ORDER — FUROSEMIDE 20 MG PO TABS: 20.0000 mg | ORAL_TABLET | Freq: Every day | ORAL | Status: DC

## 2013-09-27 MED ORDER — POTASSIUM CHLORIDE CRYS ER 20 MEQ PO TBCR
40.0000 meq | EXTENDED_RELEASE_TABLET | Freq: Once | ORAL | Status: AC
  Administered 2013-09-27: 40 meq via ORAL
  Filled 2013-09-27: qty 2

## 2013-09-27 NOTE — Progress Notes (Signed)
Subjective: Breathing better.  No LEE.  Objective: Vital signs in last 24 hours: Temp:  [97.5 F (36.4 C)-98.2 F (36.8 C)] 97.7 F (36.5 C) (09/15 0558) Pulse Rate:  [75-87] 87 (09/15 0558) Resp:  [16-21] 21 (09/15 0558) BP: (102-121)/(54-75) 111/75 mmHg (09/15 0558) SpO2:  [97 %-99 %] 99 % (09/15 0558) Weight:  [148 lb 2.4 oz (67.2 kg)] 148 lb 2.4 oz (67.2 kg) (09/15 0558) Last BM Date: 09/26/13  Intake/Output from previous day: 09/14 0701 - 09/15 0700 In: 26 [P.O.:580] Out: 1751 [Urine:1750; Stool:1] Intake/Output this shift:    Medications Current Facility-Administered Medications  Medication Dose Route Frequency Provider Last Rate Last Dose  . 0.9 %  sodium chloride infusion   Intravenous Continuous Mirna Mires, MD 20 mL/hr at 09/24/13 1522    . 0.9 %  sodium chloride infusion  250 mL Intravenous PRN Ripudeep Krystal Eaton, MD      . acetaminophen (TYLENOL) tablet 650 mg  650 mg Oral Q4H PRN Ripudeep Krystal Eaton, MD      . ALPRAZolam Duanne Moron) tablet 0.125 mg  0.125 mg Oral Daily PRN Ripudeep K Rai, MD      . aspirin chewable tablet 20.25 mg  20.25 mg Oral Daily Ripudeep K Rai, MD   20.25 mg at 09/26/13 1108  . cefTRIAXone (ROCEPHIN) 1 g in dextrose 5 % 50 mL IVPB  1 g Intravenous Q24H Ripudeep K Rai, MD   1 g at 09/26/13 1630  . cholecalciferol (VITAMIN D) tablet 1,000 Units  1,000 Units Oral QPM Ripudeep Krystal Eaton, MD   1,000 Units at 09/26/13 1705  . enoxaparin (LOVENOX) injection 40 mg  40 mg Subcutaneous Q24H Ripudeep K Rai, MD   40 mg at 09/26/13 1705  . furosemide (LASIX) injection 20 mg  20 mg Intravenous Once Mirna Mires, MD      . furosemide (LASIX) injection 40 mg  40 mg Intravenous Q12H Ripudeep Krystal Eaton, MD   40 mg at 09/27/13 0535  . levalbuterol (XOPENEX) nebulizer solution 0.63 mg  0.63 mg Nebulization Q6H PRN [name], MD      . levothyroxine (SYNTHROID, LEVOTHROID) tablet 50 mcg  50 mcg Oral QAC breakfast [name], MD   50 mcg at 09/27/13 0535  .  multivitamin with minerals tablet 1 tablet  1 tablet Oral Daily Ripudeep Krystal Eaton, MD   1 tablet at 09/26/13 1108  . multivitamin-lutein (OCUVITE-LUTEIN) capsule 1 capsule  1 capsule Oral BID Ripudeep K Rai, MD   1 capsule at 09/26/13 2152  . nebivolol (BYSTOLIC) tablet 10 mg  10 mg Oral Daily Mihai Croitoru, MD   10 mg at 09/26/13 1107  . ondansetron (ZOFRAN) injection 4 mg  4 mg Intravenous Q6H PRN Ripudeep K Rai, MD      . sertraline (ZOLOFT) tablet 25 mg  25 mg Oral q morning - 10a Ripudeep K Rai, MD   25 mg at 09/26/13 1107  . simvastatin (ZOCOR) tablet 10 mg  10 mg Oral q1800 Ripudeep Krystal Eaton, MD   10 mg at 09/26/13 1705  . sodium chloride 0.9 % injection 3 mL  3 mL Intravenous Q12H Ripudeep Krystal Eaton, MD   3 mL at 09/26/13 2152  . sodium chloride 0.9 % injection 3 mL  3 mL Intravenous PRN Ripudeep K Rai, MD      . tobramycin (TOBREX) 0.3 % ophthalmic solution 1 drop  1 drop Left Eye 4 times per day Mirna Mires, MD  1 drop at 09/27/13 0535    PE: General appearance: alert, cooperative and no distress Neck: no JVD Lungs: clear to auscultation bilaterally Heart: regular rate and rhythm, S1, S2 normal, no murmur, click, rub or gallop and 1/6 sys MM Abdomen: +BS soft nontender Extremities: No LEE Pulses: 2+ and symmetric Skin: Warm and dry Neurologic: Grossly normal  Lab Results:   Recent Labs  09/24/13 1140 09/24/13 1912 09/26/13 0424  WBC 10.8* 10.1 9.8  HGB 13.2 13.0 13.1  HCT 39.2 38.3 38.8  PLT 327 331 310   BMET  Recent Labs  09/25/13 0720 09/26/13 0424 09/27/13 0349  NA 140 138 141  K 4.0 3.9 3.5*  CL 101 100 99  CO2 23 23 25   GLUCOSE 96 108* 106*  BUN 15 20 21   CREATININE 0.80 0.85 0.83  CALCIUM 9.4 9.0 9.1    Assessment/Plan   Active Problems:   Acute on chronic diastolic congestive heart failure Net fluids: -1.2L/-3.8L.  On IV lasix 40mg  BID.   SCr stable.  Echo 09/03/13: EF 55-60%, G1DD, mild AS. DC IV lasix and start 20mg  daily PRN.  We discussed daily  weight monitoring and low sodium diet.      Hypokalemia  Replace    HTN (hypertension)  BP controlled.  Continue Bystolic 10mg .    Hypothyroidism  TSH elevated.  Synthroid was increased by Dr. Coralyn Pear   UTI (urinary tract infection)  Ceftriaxone   Abnormal EKG  NSR currently.  Bigeminy on EKG the 12th.  Decreased PVC frequency on Tele with BB.   Infection of left eye   Ambulate this morning and possibly DC later.    LOS: 3 days    HAGER, BRYAN PA-C 09/27/2013 8:56 AM  Personally seen and examined. Agree with above. 80 year old female with mild aortic stenosis, diastolic acute heart failure, frequent PVCs  I recommend 20 mg of Lasix daily. Not when necessary. Continue with Bystolic. PVCs will be helped with beta blocker. Bradycardia previously was artifactual from PVCs. Discontinued amlodipine. Xopenex seems to have helped. I do not hear an active wheezes as yesterday. Followup with Dr. Acie Fredrickson in 2 weeks I will notify office to schedule.   Okay with DC home.  Candee Furbish, MD

## 2013-09-27 NOTE — Care Management Note (Addendum)
  Page 1 of 1   09/27/2013     2:23:31 PM CARE MANAGEMENT NOTE 09/27/2013  Patient:  [name], [name]   Account Number:  [id number]  Date Initiated:  09/27/2013  Documentation initiated by:  Mariann Laster  Subjective/Objective Assessment:   CHF     Action/Plan:   CM to follow for disposition needs   Anticipated DC Date:  09/27/2013   Anticipated DC Plan:  HOME/SELF CARE         Choice offered to / List presented to:     DME arranged  [facility]      DME agency  [facility].        Status of service:  Completed, signed off Medicare Important Message given?   (If response is "NO", the following Medicare IM given date fields will be blank) Date Medicare IM given:   Medicare IM given by:   Date Additional Medicare IM given:   Additional Medicare IM given by:    Discharge Disposition:  HOME/SELF CARE  Per UR Regulation:  Reviewed for med. necessity/level of care/duration of stay  If discussed at [place] of Stay Meetings, dates discussed:    Comments:  Cheron Coryell RN, BSN, MSHL, CCM  Nurse - Case Manager,  ([facility])  [phone]  09/27/2013 Social:  From home with husband - [place] (Independent Living) Disposition:  Home / self care with Nebulizer ([facility] notified)

## 2013-09-27 NOTE — Discharge Summary (Addendum)
Physician Discharge Summary  [name] ZOX:[id number] DOB: 1921-09-08 DOA: 09/24/2013  PCP: [name], MD  Admit date: 09/24/2013 Discharge date: 09/27/2013  Time spent: 35 minutes  Recommendations for Outpatient Follow-up:  1. Please follow up on volume status, she was discharged on Lasix for acute on chronic diastolic heart failure 2. Repeat BMP in 1 week  Discharge Diagnoses:  Principal Problem:   Acute on chronic diastolic congestive heart failure Active Problems:   HTN (hypertension)   Hypothyroidism   UTI (urinary tract infection)   Abnormal EKG   Infection of left eye   Discharge Condition: Stable  Diet recommendation: Heart Healthy  Filed Weights   09/25/13 0555 09/26/13 0612 09/27/13 0558  Weight: 68.3 kg (150 lb 9.2 oz) 67.6 kg (149 lb 0.5 oz) 67.2 kg (148 lb 2.4 oz)    History of present illness:  Patient is 80 year old female with history of diastolic dysfunction (EF 81-19%, grade 1 diastolic dysfunction), hypothyroidism, mild aortic stenosis, tachybradycardia syndrome who presented with shortness of breath worsened in the last 2-3 days. Patient reported that she was at [facility] today with shortness of breath and left eye infection. However her PCP sent her to the ER due to acute on chronic CHF. Patient reported that she has been getting dyspneic with minimal exertion, she was seen by her cardiologist, Dr Acie Fredrickson last week, was recently discharged from the hospital on 09/03/13. Patient denied any cough, fevers or chills or any URI symptoms. She denied any fevers or chills or any dysuria. Denies any significant orthopnea or PND.  ER workup showed UTI, chest x-ray showed interstitial edema, initial heart rate in ED was in the 30s-low 40s, at the time of my encounter in 90s-103, patient has a known history of tachybradycardia syndrome.   Hospital Course:  This is a 80 y.o. female with PMH of mild aortic stenosis, diastolic dysfunction ( EF  55-65%) , history of bradycardia, frequent PVCs, hypertension, hypothyroidism, hyperlipidemia, and recent admission (discharged 8/22) for dyspnea on exertion, presents with worsening exertional dyspnea for the past 3 days. She states symptoms now occurring even when she performs activities of daily living . She denies current chest pain but reports history of chest pain during hospitalization about 3 weeks ago. She denies syncope, palpitaiton, recent weight change, or any change in medications. In the emergency room she is found to have bigeminy PVCs with heart ratearound 40  Acute on chronic diastolic congestive heart failure  -Patient presenting with symptoms consistent with acute CHF, unclear if PVCs are a contributing factor  -Transthoracic echocardiogram performed on 09/01/2013 showing ejection fraction of 55-60% with grade 1 diastolic dysfunction  -Has a net negative fluid balance of -3.5 L, weight of 67.2Kg -Will discharge on Lasix 20mg  PO q daily -Will need repeat BMP in 1 week Urinary tract infection  - Urinalysis on admission showing the presence of many bacteria with leukocytes.  -She was started on ceftriaxone 1 g IV every 24 hours, receiving 3 days -Urine culture showed multiple bacterial morphotypes Hypothyroidism  -TSH , back elevated at 8.17  -Increased her Synthroid from 25-50 mcg by mouth daily  -Will need repeat TSH in 4-6 weeks  Hypotension  -stable  --Will discharge on Bystolic and Lasix Hypertension.  -stable Frequent PVCs  -Stable  -Appreciate cardiology following- continue Bystolic to suppress PVCs  Mild aortic stenosis  -cardiology following- mild and of no clinical significance  Infection of left eye  -stable- states improvement in erythema  -continue Tobramycin  Hyperlipidemia  -continue home regimen of simvastatin  Depression  -continue home regimen of zoloft   Consultations:  Cardiology  Discharge Exam: Filed Vitals:   09/27/13 1338  BP: 100/84   Pulse: 87  Temp: 98 F (36.7 C)  Resp: 18   General: Patient is awake alert, states all better today  Cardiovascular: Irregular rate and rhythm normal S1-S2 no murmurs rubs gallops  Respiratory: On supplemental oxygen, she has a few bibasilar crackles  Abdomen: Soft nontender nondistended positive bowel  Musculoskeletal: No edema    Discharge Instructions You were cared for by a hospitalist during your hospital stay. If you have any questions about your discharge medications or the care you received while you were in the hospital after you are discharged, you can call the unit and asked to speak with the hospitalist on call if the hospitalist that took care of you is not available. Once you are discharged, your primary care physician will handle any further medical issues. Please note that NO REFILLS for any discharge medications will be authorized once you are discharged, as it is imperative that you return to your primary care physician (or establish a relationship with a primary care physician if you do not have one) for your aftercare needs so that they can reassess your need for medications and monitor your lab values.  Discharge Instructions   (HEART FAILURE PATIENTS) Call MD:  Anytime you have any of the following symptoms: 1) 3 pound weight gain in 24 hours or 5 pounds in 1 week 2) shortness of breath, with or without a dry hacking cough 3) swelling in the hands, feet or stomach 4) if you have to sleep on extra pillows at night in order to breathe.    Complete by:  As directed      Call MD for:  difficulty breathing, headache or visual disturbances    Complete by:  As directed      Call MD for:  extreme fatigue    Complete by:  As directed      Call MD for:  hives    Complete by:  As directed      Call MD for:  persistant dizziness or light-headedness    Complete by:  As directed      Call MD for:  persistant nausea and vomiting    Complete by:  As directed      Call MD for:   redness, tenderness, or signs of infection (pain, swelling, redness, odor or green/yellow discharge around incision site)    Complete by:  As directed      Call MD for:  severe uncontrolled pain    Complete by:  As directed      Call MD for:  temperature >100.4    Complete by:  As directed      Diet - low sodium heart healthy    Complete by:  As directed      Increase activity slowly    Complete by:  As directed           Current Discharge Medication List    START taking these medications   Details  furosemide (LASIX) 20 MG tablet Take 1 tablet (20 mg total) by mouth daily. Qty: 30 tablet, Refills: 1    levalbuterol (XOPENEX) 0.63 MG/3ML nebulizer solution Take 3 mLs (0.63 mg total) by nebulization every 6 (six) hours as needed for wheezing or shortness of breath. Qty: 3 mL, Refills: 12      CONTINUE these  medications which have CHANGED   Details  levothyroxine (SYNTHROID, LEVOTHROID) 50 MCG tablet Take 1 tablet (50 mcg total) by mouth daily before breakfast. Qty: 30 tablet, Refills: 1    nebivolol (BYSTOLIC) 10 MG tablet Take 1 tablet (10 mg total) by mouth daily. Qty: 30 tablet, Refills: 1      CONTINUE these medications which have NOT CHANGED   Details  ALPRAZolam (XANAX) 0.25 MG tablet Take 0.125 mg by mouth daily as needed for anxiety.     aspirin 81 MG tablet Take 20.25 mg by mouth daily. Takes 1/4 of an aspirin=20.25mg     cholecalciferol (VITAMIN D) 1000 UNITS tablet Take 1,000 Units by mouth every evening.    Multiple Vitamin (MULTIVITAMIN WITH MINERALS) TABS tablet Take 1 tablet by mouth daily.    Multiple Vitamins-Minerals (PRESERVISION AREDS) CAPS Take 1 capsule by mouth 2 (two) times daily.    pravastatin (PRAVACHOL) 20 MG tablet Take 20 mg by mouth every evening.     sertraline (ZOLOFT) 25 MG tablet Take 25 mg by mouth every morning.       STOP taking these medications     amLODipine (NORVASC) 5 MG tablet        No Known Allergies Follow-up  Information   Follow up with [name], MD In 1 week. (Appt. on 10-03-13 @1200  noon please arrive @11 :45 am)    Specialty:  Family Medicine   Contact information:   [place] [address] [phone]        The results of significant diagnostics from this hospitalization (including imaging, microbiology, ancillary and laboratory) are listed below for reference.    Significant Diagnostic Studies: Dg Chest 2 View  09/24/2013   CLINICAL DATA:  Irregular EKG.  Hypertension.  Chest pain.  EXAM: CHEST  2 VIEW  COMPARISON:  09/02/2013.  FINDINGS: Cardiopericardial silhouette within normal limits. Mediastinal contours normal. Aortic arch atherosclerosis. Diffusely increased interstitial markings are present, most compatible with interstitial pulmonary edema. There is no pleural effusion.  IMPRESSION: Nonspecific diffuse interstitial prominence, most compatible with interstitial edema.   Electronically Signed   By: Dereck Ligas M.D.   On: 09/24/2013 14:13   Dg Chest Port 1 View  09/02/2013   CLINICAL DATA:  Chest pain  EXAM: PORTABLE CHEST - 1 VIEW  COMPARISON:  February 02, 2012  FINDINGS: There is no edema or consolidation. Heart size and pulmonary vascularity are normal. No adenopathy. No pneumothorax. No bone lesions.  IMPRESSION: No edema or consolidation.   Electronically Signed   By: Lowella Grip M.D.   On: 09/02/2013 13:55    Microbiology: Recent Results (from the past 240 hour(s))  URINE CULTURE     Status: None   Collection Time    09/24/13  1:15 PM      Result Value Ref Range Status   Specimen Description URINE, RANDOM   Final   Special Requests NONE   Final   Culture  Setup Time     Final   Value: 09/24/2013 17:11     Performed at [facility]     Final   Value: >=100,000 COLONIES/ML     Performed at [facility]   Culture     Final   Value: Multiple bacterial morphotypes present, none predominant. Suggest  appropriate recollection if clinically indicated.     Performed at [facility]   Report Status 09/25/2013 FINAL   Final     Labs: Basic Metabolic  Panel:  Recent Labs Lab 09/24/13 1140 09/24/13 1912 09/25/13 0720 09/26/13 0424 09/27/13 0349  NA 141  --  140 138 141  K 4.4  --  4.0 3.9 3.5*  CL 105  --  101 100 99  CO2 23  --  23 23 25   GLUCOSE 91  --  96 108* 106*  BUN 14  --  15 20 21   CREATININE 0.73 0.81 0.80 0.85 0.83  CALCIUM 9.5  --  9.4 9.0 9.1   Liver Function Tests:  Recent Labs Lab 09/24/13 1140  AST 16  ALT 12  ALKPHOS 90  BILITOT 0.4  PROT 7.5  ALBUMIN 3.5   No results found for this basename: LIPASE, AMYLASE,  in the last 168 hours No results found for this basename: AMMONIA,  in the last 168 hours CBC:  Recent Labs Lab 09/24/13 1140 09/24/13 1912 09/26/13 0424  WBC 10.8* 10.1 9.8  HGB 13.2 13.0 13.1  HCT 39.2 38.3 38.8  MCV 92.9 91.8 91.7  PLT 327 331 310   Cardiac Enzymes:  Recent Labs Lab 09/24/13 1140 09/24/13 1912 09/24/13 2300 09/25/13 0720  TROPONINI <0.30 <0.30 <0.30 <0.30   BNP: BNP (last 3 results)  Recent Labs  09/02/13 1227 09/24/13 1140  PROBNP 149.9 818.9*   CBG: No results found for this basename: GLUCAP,  in the last 168 hours     Signed:  [name]  [facility] 09/27/2013, 1:54 PM

## 2013-10-03 DIAGNOSIS — Z79899 Other long term (current) drug therapy: Secondary | ICD-10-CM | POA: Diagnosis not present

## 2013-10-03 DIAGNOSIS — I5030 Unspecified diastolic (congestive) heart failure: Secondary | ICD-10-CM | POA: Diagnosis not present

## 2013-10-03 DIAGNOSIS — F3289 Other specified depressive episodes: Secondary | ICD-10-CM | POA: Diagnosis not present

## 2013-10-03 DIAGNOSIS — F329 Major depressive disorder, single episode, unspecified: Secondary | ICD-10-CM | POA: Diagnosis not present

## 2013-10-03 DIAGNOSIS — H109 Unspecified conjunctivitis: Secondary | ICD-10-CM | POA: Diagnosis not present

## 2013-10-04 ENCOUNTER — Telehealth: Payer: Self-pay | Admitting: Cardiovascular Disease

## 2013-10-04 DIAGNOSIS — H3530 Unspecified macular degeneration: Secondary | ICD-10-CM | POA: Diagnosis not present

## 2013-10-04 DIAGNOSIS — H43819 Vitreous degeneration, unspecified eye: Secondary | ICD-10-CM | POA: Diagnosis not present

## 2013-10-04 DIAGNOSIS — D2310 Other benign neoplasm of skin of unspecified eyelid, including canthus: Secondary | ICD-10-CM | POA: Diagnosis not present

## 2013-10-04 DIAGNOSIS — Z961 Presence of intraocular lens: Secondary | ICD-10-CM | POA: Diagnosis not present

## 2013-10-04 NOTE — Telephone Encounter (Signed)
New problem:    Per pt's hu's pt was dx with CHF by PCP Dr Edwin Dada.  The will fax the notes and pt's medication has been adjusted pt was told to notifiy this office.    Please give her a call back.

## 2013-10-04 NOTE — Telephone Encounter (Signed)
Left message to call back to discuss.  Advised on message that I am off tomorrow and that a message can be left with triage nurse and forwarded to Dr. Acie Fredrickson or patient may call back on Thursday when I return to the office

## 2013-10-06 NOTE — Telephone Encounter (Signed)
Paperwork from Dr. Drema Dallas received, reviewed, and placed in file for scanning into chart

## 2013-10-19 ENCOUNTER — Encounter: Payer: Self-pay | Admitting: Cardiovascular Disease

## 2013-10-19 ENCOUNTER — Ambulatory Visit (INDEPENDENT_AMBULATORY_CARE_PROVIDER_SITE_OTHER): Payer: Medicare Other | Admitting: Cardiovascular Disease

## 2013-10-19 VITALS — BP 142/62 | HR 63 | Ht 64.0 in | Wt 152.0 lb

## 2013-10-19 DIAGNOSIS — I10 Essential (primary) hypertension: Secondary | ICD-10-CM

## 2013-10-19 DIAGNOSIS — R0602 Shortness of breath: Secondary | ICD-10-CM | POA: Diagnosis not present

## 2013-10-19 DIAGNOSIS — I5032 Chronic diastolic (congestive) heart failure: Secondary | ICD-10-CM

## 2013-10-19 LAB — BASIC METABOLIC PANEL
BUN: 15 mg/dL (ref 6–23)
CHLORIDE: 104 meq/L (ref 96–112)
CO2: 24 mEq/L (ref 19–32)
Calcium: 9.5 mg/dL (ref 8.4–10.5)
Creatinine, Ser: 0.8 mg/dL (ref 0.4–1.2)
GFR: 68.58 mL/min (ref >60.00)
GLUCOSE: 88 mg/dL (ref 70–99)
Potassium: 4.2 mEq/L (ref 3.5–5.1)
Sodium: 138 mEq/L (ref 135–145)

## 2013-10-19 LAB — BRAIN NATRIURETIC PEPTIDE: Pro B Natriuretic peptide (BNP): 27 pg/mL (ref 0.0–100.0)

## 2013-10-19 NOTE — Progress Notes (Signed)
[name] Date of Birth  October 24, 1921 [facility] [address]    [place], [place]  [address] [phone]  Fax  [phone]  Problem list: 1. Hypertension 2. Hyperlipidemia 3. Hypothyroidism  History of Present Illness:  [name] is an 80 year old female with a history of diastolic dysfunction, mild aortic stenosis and hypothyroidism. She also has a history of tachybradycardia syndrome.  She's had a stress Myoview study in May of 2010 which was normal. She has an ejection fraction of 75%. There was no ischemia.  She had an echocardiogram performed in September, 2001 which revealed normal left ventricular systolic function with moderate left ventricular hypertrophy and mild aortic stenosis.  She presents today for followup visit. She does not have any complaints. She denies any chest pain or shortness of breath.     Sept. 12, 2014:  [name] is doing well now.  She has had some dizziness and had several episodes of near syncope.   Both episodes occurred while she was sitting.  She was diagnosed with vertigo and was given meclizine.   24 hour Holter revealed NSR and 1 PVC.  We have an echocardiogram from October, 2013 which revealed normal left ventricular systolic function. She has mild aortic stenosis.  March 23, 2013:  She was last seen in Sept. 2014.  Her BP has been a bit more variable.   Her blood pressure varies between 150s to 120s. She does not eat any extra salt but she eats at the regular food bar at [facility] at [facility]  - her husband eats at the heart healthy food bar.    Sept. 8, 2015:  She is being more careful with her salt intake.  She is feeling well. She has some DOE.   She was admitted to the hospital last month with some shortness breath. Her troponin levels were negative. Echocardiogram revealed mild aortic stenosis.  Left ventricle: The cavity size was normal. Wall thickness was increased in a pattern of mild LVH.  Systolic function was normal. The estimated ejection fraction was in the range of 55% to 60%. Doppler parameters are consistent with abnormal left ventricular relaxation (grade 1 diastolic dysfunction). - Aortic valve: Moderately calcified annulus. There was mild stenosis. There was trivial regurgitation. Valve area (VTI): 1.32 cm^2. Valve area (Vmax): 1.46 cm^2. Valve area (Vmean): 1.34 cm^2. - Mitral valve: Calcified annulus. - Left atrium: The atrium was mildly dilated. - Atrial septum: No defect or patent foramen ovale was identified  Oct. 7, 2015:  Spell was recently hospitalized for diastolic dysfunction. She had Lasix added to her medical regimen. She was also found to be mildly hypothyroid and her Synthroid dose was increased. She's feeling better at this point.  He apparently had some wheezing during her hospitalization and was prescribed a nebulizer. She has not been using the nebulizer and feels well.   Current Outpatient Prescriptions on File Prior to Visit  Medication Sig Dispense Refill  . ALPRAZolam (XANAX) 0.25 MG tablet Take 0.125 mg by mouth daily as needed for anxiety.       [name] aspirin 81 MG tablet Take 20.25 mg by mouth daily. Takes 1/4 of an aspirin=20.25mg       . furosemide (LASIX) 20 MG tablet Take 1 tablet (20 mg total) by mouth daily.  30 tablet  1  . levothyroxine (SYNTHROID, LEVOTHROID) 50 MCG tablet Take 1 tablet (50 mcg total) by mouth daily before breakfast.  30 tablet  1  .  Multiple Vitamin (MULTIVITAMIN WITH MINERALS) TABS tablet Take 1 tablet by mouth daily.      . Multiple Vitamins-Minerals (PRESERVISION AREDS) CAPS Take 1 capsule by mouth 2 (two) times daily.      . nebivolol (BYSTOLIC) 10 MG tablet Take 1 tablet (10 mg total) by mouth daily.  30 tablet  1  . pravastatin (PRAVACHOL) 20 MG tablet Take 20 mg by mouth every evening.       . sertraline (ZOLOFT) 25 MG tablet Take 25 mg by mouth every morning.       . [DISCONTINUED] Calcium Carb-Cholecalciferol  (CALCIUM 1000 + D PO) Take 1,000 mg by mouth daily.        . [DISCONTINUED] escitalopram (LEXAPRO) 10 MG tablet Take 10 mg by mouth daily.         No current facility-administered medications on file prior to visit.    No Known Allergies  Past Medical History  Diagnosis Date  . Hypertension   . Hyperlipidemia   . Tachycardia     history of tachycardia and bradycardia  . Hypothyroidism   . Dyspnea   . Diastolic dysfunction     mild   . Mild mitral regurgitation by prior echocardiogram   . Bradycardia     hospitalized for   . Arthritis     Past Surgical History  Procedure Laterality Date  . Total abdominal hysterectomy    . Cholecystectomy    . Extracorporeal shock wave lithotripsy    . Tonsillectomy      History  Smoking status  . Never Smoker   Smokeless tobacco  . Never Used    History  Alcohol Use No    Family History  Problem Relation Age of Onset  . Emphysema Father   . Heart failure Mother   . Heart attack Brother   . Cancer Brother     kidney  . Stroke Brother     Reviw of Systems:  Reviewed in the HPI.  All other systems are negative.  Physical Exam: BP 142/62  Pulse 63  Ht 5\' 4"  (1.626 m)  Wt 152 lb (68.947 kg)  BMI 26.08 kg/m2 The patient is alert and oriented x 3.  The mood and affect are normal.   Skin: warm and dry.  Color is normal.    HEENT:   Normocephalic/atraumatic. Her carotids are normal.  Lungs: clear  Heart: Regular rate S1-S2. Chest soft 1-9/6 systolic murmur.    Abdomen: Abdomen is benign.  Extremities:  No c/c/e  Neuro:  Hard of hearing, otherwise nonfocal    ECG: 09/24/2012: Normal sinus rhythm at 84 beats a minute. The EKG is essentially normal. Assessment / Plan:

## 2013-10-19 NOTE — Assessment & Plan Note (Addendum)
Echo showed  Left ventricle: The cavity size was normal. Wall thickness was increased in a pattern of mild LVH. Systolic function was normal. The estimated ejection fraction was in the range of 55% to 60%. Doppler parameters are consistent with abnormal left ventricular relaxation (grade 1 diastolic dysfunction). - Aortic valve: Moderately calcified annulus. There was mild stenosis. There was trivial regurgitation. Valve area (VTI): 1.32 cm^2. Valve area (Vmax): 1.46 cm^2. Valve area (Vmean): 1.34 cm^2. - Mitral valve: Calcified annulus. - Left atrium: The atrium was mildly dilated. - Atrial septum: No defect or patent foramen ovale was identified  She is doing well on her lasix.  She has had shortness of breath but this has improved. Will check BMP and BNP today I will see her in 3 months for OV and follow up blood work.

## 2013-10-19 NOTE — Patient Instructions (Signed)
Your physician has recommended you make the following change in your medication:  1) STOP Aspirin  Your physician recommends that you have lab work today (BMP and BNP).  Your physician recommends that you schedule a follow-up appointment in: 3 months with Dr. Acie Fredrickson.  Your physician recommends that you return for lab work in 3 months at next office visit (BMP, BNP)

## 2013-10-25 ENCOUNTER — Emergency Department (HOSPITAL_COMMUNITY)
Admission: EM | Admit: 2013-10-25 | Discharge: 2013-10-25 | Disposition: A | Discharge status: Home / Self Care | Payer: Medicare Other | Attending: Emergency Medicine | Admitting: Emergency Medicine

## 2013-10-25 ENCOUNTER — Encounter (HOSPITAL_COMMUNITY): Payer: Self-pay | Admitting: Emergency Medicine

## 2013-10-25 DIAGNOSIS — S4991XA Unspecified injury of right shoulder and upper arm, initial encounter: Secondary | ICD-10-CM | POA: Diagnosis present

## 2013-10-25 DIAGNOSIS — M79601 Pain in right arm: Secondary | ICD-10-CM | POA: Diagnosis not present

## 2013-10-25 DIAGNOSIS — Y9301 Activity, walking, marching and hiking: Secondary | ICD-10-CM | POA: Insufficient documentation

## 2013-10-25 DIAGNOSIS — S42301D Unspecified fracture of shaft of humerus, right arm, subsequent encounter for fracture with routine healing: Secondary | ICD-10-CM | POA: Diagnosis not present

## 2013-10-25 DIAGNOSIS — E785 Hyperlipidemia, unspecified: Secondary | ICD-10-CM | POA: Diagnosis not present

## 2013-10-25 DIAGNOSIS — S42301A Unspecified fracture of shaft of humerus, right arm, initial encounter for closed fracture: Secondary | ICD-10-CM | POA: Diagnosis not present

## 2013-10-25 DIAGNOSIS — Z79899 Other long term (current) drug therapy: Secondary | ICD-10-CM | POA: Diagnosis not present

## 2013-10-25 DIAGNOSIS — Y9289 Other specified places as the place of occurrence of the external cause: Secondary | ICD-10-CM | POA: Insufficient documentation

## 2013-10-25 DIAGNOSIS — I340 Nonrheumatic mitral (valve) insufficiency: Secondary | ICD-10-CM | POA: Diagnosis not present

## 2013-10-25 DIAGNOSIS — W010XXA Fall on same level from slipping, tripping and stumbling without subsequent striking against object, initial encounter: Secondary | ICD-10-CM | POA: Insufficient documentation

## 2013-10-25 DIAGNOSIS — I10 Essential (primary) hypertension: Secondary | ICD-10-CM | POA: Diagnosis not present

## 2013-10-25 DIAGNOSIS — E039 Hypothyroidism, unspecified: Secondary | ICD-10-CM | POA: Diagnosis not present

## 2013-10-25 DIAGNOSIS — S42211A Unspecified displaced fracture of surgical neck of right humerus, initial encounter for closed fracture: Secondary | ICD-10-CM | POA: Diagnosis not present

## 2013-10-25 DIAGNOSIS — M1990 Unspecified osteoarthritis, unspecified site: Secondary | ICD-10-CM | POA: Insufficient documentation

## 2013-10-25 MED ORDER — OXYCODONE-ACETAMINOPHEN 5-325 MG PO TABS
1.0000 | ORAL_TABLET | Freq: Once | ORAL | Status: AC
  Administered 2013-10-25: 1 via ORAL
  Filled 2013-10-25: qty 1

## 2013-10-25 MED ORDER — ONDANSETRON 4 MG PO TBDP
8.0000 mg | ORAL_TABLET | Freq: Once | ORAL | Status: AC
  Administered 2013-10-25: 8 mg via ORAL
  Filled 2013-10-25: qty 2

## 2013-10-25 MED ORDER — OXYCODONE-ACETAMINOPHEN 5-325 MG PO TABS: 1.0000 | ORAL_TABLET | ORAL | Status: DC | PRN

## 2013-10-25 MED ORDER — FENTANYL CITRATE 0.05 MG/ML IJ SOLN
50.0000 ug | Freq: Once | INTRAMUSCULAR | Status: AC
  Administered 2013-10-25: 50 ug via SUBCUTANEOUS
  Filled 2013-10-25: qty 2

## 2013-10-25 MED ORDER — IBUPROFEN 200 MG PO TABS
400.0000 mg | ORAL_TABLET | Freq: Once | ORAL | Status: AC
  Administered 2013-10-25: 400 mg via ORAL
  Filled 2013-10-25: qty 2

## 2013-10-25 NOTE — ED Notes (Addendum)
Here from [place], see paperwork and CD of imaging, here by POV with husband for R humeral fx, R arm in sling, morphine 10mg  IM given at ~1830, c/o pain, 10/10, alert, NAD, calm, looks uncomfortable. reports fall at 1330. CMS intact, ROM limited.

## 2013-10-25 NOTE — Discharge Instructions (Signed)
Use ice on the shoulder, every 3 hours for 30 minutes. Keep the shoulder immobilizer on until you see the orthopedist.    Humerus Fracture, Treated with Immobilization The humerus is the large bone in your upper arm. You have a broken (fractured) humerus. These fractures are easily diagnosed with X-rays. TREATMENT  Simple fractures which will heal without disability are treated with simple immobilization. Immobilization means you will wear a cast, splint, or sling. You have a fracture which will do well with immobilization. The fracture will heal well simply by being held in a good position until it is stable enough to begin range of motion exercises. Do not take part in activities which would further injure your arm.  HOME CARE INSTRUCTIONS   Put ice on the injured area.  Put ice in a plastic bag.  Place a towel between your skin and the bag.  Leave the ice on for 15-20 minutes, 03-04 times a day.  If you have a cast:  Do not scratch the skin under the cast using sharp or pointed objects.  Check the skin around the cast every day. You may put lotion on any red or sore areas.  Keep your cast dry and clean.  If you have a splint:  Wear the splint as directed.  Keep your splint dry and clean.  You may loosen the elastic around the splint if your fingers become numb, tingle, or turn cold or blue.  If you have a sling:  Wear the sling as directed.  Do not put pressure on any part of your cast or splint until it is fully hardened.  Your cast or splint can be protected during bathing with a plastic bag. Do not lower the cast or splint into water.  Only take over-the-counter or prescription medicines for pain, discomfort, or fever as directed by your caregiver.  Do range of motion exercises as instructed by your caregiver.  Follow up as directed by your caregiver. This is very important in order to avoid permanent injury or disability and chronic pain. SEEK IMMEDIATE MEDICAL  CARE IF:   Your skin or nails in the injured arm turn blue or gray.  Your arm feels cold or numb.  You develop severe pain in the injured arm.  You are having problems with the medicines you were given. MAKE SURE YOU:   Understand these instructions.  Will watch your condition.  Will get help right away if you are not doing well or get worse. Document Released: 04/07/2000 Document Revised: 03/24/2011 Document Reviewed: 02/13/2010 [facility] Patient Information 2015 [place], [place]. This information is not intended to replace advice given to you by your health care provider. Make sure you discuss any questions you have with your health care provider.

## 2013-10-25 NOTE — ED Provider Notes (Signed)
CSN: [id number]     Arrival date & time 10/25/13  1932 History   First MD Initiated Contact with Patient 10/25/13 2055     Chief Complaint  Patient presents with  . Fall  . Arm Injury     (Consider location/radiation/quality/duration/timing/severity/associated sxs/prior Treatment) HPI  [name] is a 80 y.o. femaleWho presents for evaluation of a known humerus fracture. She went to urgent care today after falling and injuring her right shoulder. She was placed in a sling, and decided to come back to [facility] for further evaluation. Her injury occurred when she tripped on a root while walking in the woods. She denies head, back or neck injury. She had mild relief from an injection of morphine, but the pain has returned. She denies other preceding symptoms. There've been no recent medical illnesses. No other known modifying factors   Past Medical History  Diagnosis Date  . Hypertension   . Hyperlipidemia   . Tachycardia     history of tachycardia and bradycardia  . Hypothyroidism   . Dyspnea   . Diastolic dysfunction     mild   . Mild mitral regurgitation by prior echocardiogram   . Bradycardia     hospitalized for   . Arthritis    Past Surgical History  Procedure Laterality Date  . Total abdominal hysterectomy    . Cholecystectomy    . Extracorporeal shock wave lithotripsy    . Tonsillectomy     Family History  Problem Relation Age of Onset  . Emphysema Father   . Heart failure Mother   . Heart attack Brother   . Cancer Brother     kidney  . Stroke Brother    History  Substance Use Topics  . Smoking status: Never Smoker   . Smokeless tobacco: Never Used  . Alcohol Use: No   OB History   Grav Para Term Preterm Abortions TAB SAB Ect Mult Living                 Review of Systems  All other systems reviewed and are negative.     Allergies  Review of patient's allergies indicates no known allergies.  Home Medications   Prior to Admission medications    Medication Sig Start Date End Date Taking? Authorizing Provider  ALPRAZolam (XANAX) 0.25 MG tablet Take 0.125 mg by mouth daily as needed for anxiety.     Historical Provider, MD  amLODipine (NORVASC) 5 MG tablet  09/02/13   Historical Provider, MD  furosemide (LASIX) 20 MG tablet Take 1 tablet (20 mg total) by mouth daily. 09/27/13   [name], MD  levothyroxine (SYNTHROID, LEVOTHROID) 50 MCG tablet Take 1 tablet (50 mcg total) by mouth daily before breakfast. 09/27/13   [name], MD  Multiple Vitamin (MULTIVITAMIN WITH MINERALS) TABS tablet Take 1 tablet by mouth daily.    Historical Provider, MD  Multiple Vitamins-Minerals (PRESERVISION AREDS) CAPS Take 1 capsule by mouth 2 (two) times daily.    Historical Provider, MD  nebivolol (BYSTOLIC) 10 MG tablet Take 1 tablet (10 mg total) by mouth daily. 09/27/13   [name], MD  oxyCODONE-acetaminophen (PERCOCET) 5-325 MG per tablet Take 1 tablet by mouth every 4 (four) hours as needed for severe pain. 10/25/13   Richarda Blade, MD  oxyCODONE-acetaminophen (PERCOCET/ROXICET) 5-325 MG per tablet Take 1 tablet by mouth every 4 (four) hours as needed for moderate pain or severe pain. 10/25/13   Richarda Blade, MD  pravastatin (PRAVACHOL) 20  MG tablet Take 20 mg by mouth every evening.     Historical Provider, MD  sertraline (ZOLOFT) 25 MG tablet Take 25 mg by mouth every morning.  08/15/13   Historical Provider, MD   BP 102/56  Pulse 66  Temp(Src) 97.8 F (36.6 C) (Oral)  Resp 14  SpO2 93% Physical Exam  Nursing note and vitals reviewed. Constitutional: She is oriented to person, place, and time. She appears well-developed.  Elderly, frail  HENT:  Head: Normocephalic and atraumatic.  Eyes: Conjunctivae and EOM are normal. Pupils are equal, round, and reactive to light.  Neck: Normal range of motion and phonation normal. Neck supple.  Cardiovascular: Normal rate.   Pulmonary/Chest: Effort normal. She exhibits no tenderness.   Musculoskeletal: Normal range of motion.  Right shoulder, tender, with mild swelling, but no deformity. Neurovascular intact distally in the right hand. There is no tenderness of the right elbow or right wrist.  Neurological: She is alert and oriented to person, place, and time. She exhibits normal muscle tone.  Skin: Skin is warm and dry.  Psychiatric: She has a normal mood and affect. Her behavior is normal. Judgment and thought content normal.    ED Course  Procedures (including critical care time)  Medications  oxyCODONE-acetaminophen (PERCOCET/ROXICET) 5-325 MG per tablet 1 tablet (1 tablet Oral Given 10/25/13 2020)  ibuprofen (ADVIL,MOTRIN) tablet 400 mg (400 mg Oral Given 10/25/13 2020)  ondansetron (ZOFRAN-ODT) disintegrating tablet 8 mg (8 mg Oral Given 10/25/13 2020)  fentaNYL (SUBLIMAZE) injection 50 mcg (50 mcg Subcutaneous Given 10/25/13 2105)    Patient Vitals for the past 24 hrs:  BP Temp Temp src Pulse Resp SpO2  10/25/13 2150 102/56 mmHg 97.8 F (36.6 C) Oral 66 14 93 %  10/25/13 2020 113/56 mmHg 98.6 F (37 C) Oral 63 16 96 %   Shoulder immobilizer applied, right, the orthopedic technician. After application of immobilizer, and normal sensation, circulation, and fingers of right hand- 21:35      Labs Review Labs Reviewed - No data to display  Imaging Review No results found.   EKG Interpretation None      MDM   Final diagnoses:  Fracture, humerus, right, closed, initial encounter   Mechanical fall with humerus fracture. No indication for surgical repair at this time.   Nursing Notes Reviewed/ Care Coordinated Applicable Imaging Reviewed Interpretation of Laboratory Data incorporated into ED treatment  The patient appears reasonably screened and/or stabilized for discharge and I doubt any other medical condition or other [facility] requiring further screening, evaluation, or treatment in the ED at this time prior to discharge.  Plan: Home Medications-  Percocet; Home Treatments- Shoulder Immobilizer; return here if the recommended treatment, does not improve the symptoms; Recommended follow up- Ortho 3 days    Richarda Blade, MD 10/26/13 [phone]

## 2013-10-26 ENCOUNTER — Other Ambulatory Visit (HOSPITAL_COMMUNITY): Payer: Self-pay | Admitting: Orthopaedic Surgery

## 2013-10-26 DIAGNOSIS — S42291A Other displaced fracture of upper end of right humerus, initial encounter for closed fracture: Secondary | ICD-10-CM | POA: Diagnosis not present

## 2013-10-27 ENCOUNTER — Other Ambulatory Visit: Payer: Self-pay

## 2013-10-27 ENCOUNTER — Telehealth: Payer: Self-pay | Admitting: Cardiovascular Disease

## 2013-10-27 MED ORDER — AMLODIPINE BESYLATE 5 MG PO TABS: ORAL_TABLET | ORAL | Status: DC

## 2013-10-27 NOTE — Telephone Encounter (Signed)
New message     Pt is scheduled to have shoulder surgery on Monday.  Husband want to know if it is ok with him for pt to have surgery?

## 2013-10-27 NOTE — Telephone Encounter (Signed)
Forwarded to Dr. Acie Fredrickson

## 2013-10-28 ENCOUNTER — Encounter (HOSPITAL_COMMUNITY): Payer: Self-pay | Admitting: *Deleted

## 2013-10-28 NOTE — Telephone Encounter (Signed)
[facility] pre-admition is calling dr Lorin Mercy is doing surgery on patient Monday and they want to know if he has been cleared yet.

## 2013-10-28 NOTE — Progress Notes (Signed)
During pre-op phone call, pt's husband states he had called Dr. Elmarie Shiley office to notify him that pt was to have surgery and was told someone would call him back. He states that was 2 days ago and has not heard anything from Dr. Elmarie Shiley office. He wants to make sure Dr. Acie Fredrickson is aware of the surgery. I called Dr. Elmarie Shiley office and the receptionist sees a note that has been sent to Dr. Acie Fredrickson, but does not see a response from him. She is sending him another note and asking for a cardiac clearance.

## 2013-10-28 NOTE — Progress Notes (Signed)
Anesthesia Chart Review:  Pt is 80 year old female scheduled for ORIF R proximal humerus fracture on 10/31/2013 with Dr. Lorin Mercy. Is scheduled for same day workup.   PMH: Diastolic heart failure, HTN, hyperlipidemia, mild aortic stenosis, dysrhythmia, hypothyroidism.   Medications include: amlodipine, lasix, bystolic, levothyroxine, pravastatin, zoloft, xanax  Pre-op labs scheduled to be drawn DOS.  BMET from 10/19/2013 WNL, CBC from 09/26/2013 WNL.   Chest x-ray 09/24/2013 reviewed. Cardiopericardial silhouette within normal limits. Mediastinal contours normal. Aortic arch atherosclerosis. Diffusely increased interstitial markings are present, most compatible with interstitial pulmonary edema. There is no pleural effusion.  EKG 09/24/2013 from admission 9/12-9/15/2015 for acute on chronic diastolic failure: Sinus tachycardia. Ventricular bigeminy. During hospitalization had decreased PVC frequency with beta blocker. At discharge was in NSR.   Saw [name] for follow up on 10/19/13 and pt was doing well at that time. BNP at that time was normal.   2D echo 09/03/2013:  - Left ventricle: The cavity size was normal. Wall thickness was increased in a pattern of mild LVH. Systolic function was normal. The estimated ejection fraction was in the range of 55% to 60%. Doppler parameters are consistent with abnormal left ventricular relaxation (grade 1 diastolic dysfunction). - Aortic valve: Moderately calcified annulus. There was mild stenosis. There was trivial regurgitation. Valve area (VTI): 1.32cm^2. Valve area (Vmax): 1.46 cm^2. Valve area (Vmean): 1.34cm^2. - Mitral valve: Calcified annulus. - Left atrium: The atrium was mildly dilated. - Atrial septum: No defect or patent foramen ovale was identified.  Nuclear Med stress test 05/24/2008: Overall impression: Normal stress nuclear study. No evidence of ischemia. Normal LV function.   Pt has contacted Dr. Acie Fredrickson to notify him of surgery. Unless otherwise  documented in EPIC, pt should be able to come in and be evaluated by assigned anesthesiologist on 10/31/2013.   Willeen Cass, FNP-BC [facility]/Anesthesiology Phone: [phone] 10/28/2013 2:48 PM

## 2013-10-30 MED ORDER — CEFAZOLIN SODIUM-DEXTROSE 2-3 GM-% IV SOLR: 2.0000 g | INTRAVENOUS | Status: DC

## 2013-10-31 ENCOUNTER — Inpatient Hospital Stay (HOSPITAL_COMMUNITY): Payer: Medicare Other | Admitting: Emergency Medicine

## 2013-10-31 ENCOUNTER — Observation Stay (HOSPITAL_COMMUNITY): Payer: Medicare Other

## 2013-10-31 ENCOUNTER — Encounter (HOSPITAL_COMMUNITY): Payer: Medicare Other | Admitting: Emergency Medicine

## 2013-10-31 ENCOUNTER — Encounter (HOSPITAL_COMMUNITY): Payer: Self-pay | Admitting: Anesthesiology

## 2013-10-31 ENCOUNTER — Encounter (HOSPITAL_COMMUNITY)
Admission: RE | Disposition: A | Discharge status: Home / Self Care | Payer: Self-pay | Source: Ambulatory Visit | Attending: Orthopaedic Surgery

## 2013-10-31 ENCOUNTER — Observation Stay (HOSPITAL_COMMUNITY)
Admission: RE | Admit: 2013-10-31 | Discharge: 2013-11-01 | Disposition: A | Discharge status: Home / Self Care | Payer: Medicare Other | Source: Ambulatory Visit | Attending: Orthopaedic Surgery | Admitting: Orthopaedic Surgery

## 2013-10-31 DIAGNOSIS — S42201D Unspecified fracture of upper end of right humerus, subsequent encounter for fracture with routine healing: Secondary | ICD-10-CM | POA: Diagnosis not present

## 2013-10-31 DIAGNOSIS — Y9301 Activity, walking, marching and hiking: Secondary | ICD-10-CM | POA: Insufficient documentation

## 2013-10-31 DIAGNOSIS — M1990 Unspecified osteoarthritis, unspecified site: Secondary | ICD-10-CM | POA: Diagnosis not present

## 2013-10-31 DIAGNOSIS — G8918 Other acute postprocedural pain: Secondary | ICD-10-CM | POA: Diagnosis not present

## 2013-10-31 DIAGNOSIS — E039 Hypothyroidism, unspecified: Secondary | ICD-10-CM | POA: Diagnosis not present

## 2013-10-31 DIAGNOSIS — Z79899 Other long term (current) drug therapy: Secondary | ICD-10-CM | POA: Insufficient documentation

## 2013-10-31 DIAGNOSIS — E785 Hyperlipidemia, unspecified: Secondary | ICD-10-CM | POA: Insufficient documentation

## 2013-10-31 DIAGNOSIS — I5030 Unspecified diastolic (congestive) heart failure: Secondary | ICD-10-CM | POA: Diagnosis not present

## 2013-10-31 DIAGNOSIS — W1809XA Striking against other object with subsequent fall, initial encounter: Secondary | ICD-10-CM | POA: Diagnosis not present

## 2013-10-31 DIAGNOSIS — S42291D Other displaced fracture of upper end of right humerus, subsequent encounter for fracture with routine healing: Secondary | ICD-10-CM | POA: Diagnosis not present

## 2013-10-31 DIAGNOSIS — I10 Essential (primary) hypertension: Secondary | ICD-10-CM | POA: Diagnosis not present

## 2013-10-31 DIAGNOSIS — F419 Anxiety disorder, unspecified: Secondary | ICD-10-CM | POA: Insufficient documentation

## 2013-10-31 DIAGNOSIS — S42201A Unspecified fracture of upper end of right humerus, initial encounter for closed fracture: Principal | ICD-10-CM | POA: Diagnosis present

## 2013-10-31 DIAGNOSIS — Y9289 Other specified places as the place of occurrence of the external cause: Secondary | ICD-10-CM | POA: Diagnosis not present

## 2013-10-31 DIAGNOSIS — S42201B Unspecified fracture of upper end of right humerus, initial encounter for open fracture: Secondary | ICD-10-CM | POA: Diagnosis not present

## 2013-10-31 DIAGNOSIS — S42209A Unspecified fracture of upper end of unspecified humerus, initial encounter for closed fracture: Secondary | ICD-10-CM | POA: Diagnosis present

## 2013-10-31 DIAGNOSIS — T148XXA Other injury of unspecified body region, initial encounter: Secondary | ICD-10-CM

## 2013-10-31 HISTORY — DX: Nonrheumatic aortic (valve) stenosis: I35.0

## 2013-10-31 HISTORY — DX: Anxiety disorder, unspecified: F41.9

## 2013-10-31 HISTORY — DX: Calculus of kidney: N20.0

## 2013-10-31 HISTORY — PX: ORIF HUMERUS FRACTURE: SHX2126

## 2013-10-31 LAB — CBC
HEMATOCRIT: 33.3 % — AB (ref 36.0–46.0)
Hemoglobin: 11.2 g/dL — ABNORMAL LOW (ref 12.0–15.0)
MCH: 31.2 pg (ref 26.0–34.0)
MCHC: 33.6 g/dL (ref 30.0–36.0)
MCV: 92.8 fL (ref 78.0–100.0)
PLATELETS: 337 10*3/uL (ref 150–400)
RBC: 3.59 MIL/uL — ABNORMAL LOW (ref 3.87–5.11)
RDW: 14.1 % (ref 11.5–15.5)
WBC: 9.7 10*3/uL (ref 4.0–10.5)

## 2013-10-31 LAB — COMPREHENSIVE METABOLIC PANEL
ALBUMIN: 3.1 g/dL — AB (ref 3.5–5.2)
ALT: 41 U/L — ABNORMAL HIGH (ref 0–35)
AST: 32 U/L (ref 0–37)
Alkaline Phosphatase: 101 U/L (ref 39–117)
Anion gap: 14 (ref 5–15)
BUN: 12 mg/dL (ref 6–23)
CO2: 23 mEq/L (ref 19–32)
CREATININE: 0.59 mg/dL (ref 0.50–1.10)
Calcium: 9.4 mg/dL (ref 8.4–10.5)
Chloride: 101 mEq/L (ref 96–112)
GFR calc Af Amer: >90 mL/min (ref >90)
GFR calc non Af Amer: 78 mL/min — ABNORMAL LOW (ref >90)
Glucose, Bld: 99 mg/dL (ref 70–99)
Potassium: 4.5 mEq/L (ref 3.7–5.3)
Sodium: 138 mEq/L (ref 137–147)
TOTAL PROTEIN: 7.5 g/dL (ref 6.0–8.3)
Total Bilirubin: 0.8 mg/dL (ref 0.3–1.2)

## 2013-10-31 LAB — PROTIME-INR
INR: 1.05 (ref 0.00–1.49)
PROTHROMBIN TIME: 13.8 s (ref 11.6–15.2)

## 2013-10-31 SURGERY — OPEN REDUCTION INTERNAL FIXATION (ORIF) PROXIMAL HUMERUS FRACTURE
Anesthesia: General | Site: Shoulder | Laterality: Right

## 2013-10-31 MED ORDER — DIPHENHYDRAMINE HCL 12.5 MG/5ML PO ELIX
12.5000 mg | ORAL_SOLUTION | ORAL | Status: DC | PRN
  Administered 2013-11-01 (×2): 25 mg via ORAL
  Administered 2013-11-01: 12.5 mg via ORAL
  Filled 2013-10-31 (×3): qty 10

## 2013-10-31 MED ORDER — HYDROCODONE-ACETAMINOPHEN 5-325 MG PO TABS
1.0000 | ORAL_TABLET | ORAL | Status: DC | PRN
Start: 2013-10-31 — End: 2013-11-01

## 2013-10-31 MED ORDER — ONDANSETRON HCL 4 MG/2ML IJ SOLN: 4.0000 mg | Freq: Four times a day (QID) | INTRAMUSCULAR | Status: DC | PRN

## 2013-10-31 MED ORDER — LEVOTHYROXINE SODIUM 50 MCG PO TABS
50.0000 ug | ORAL_TABLET | Freq: Every day | ORAL | Status: DC
  Administered 2013-11-01: 50 ug via ORAL
  Filled 2013-10-31 (×2): qty 1

## 2013-10-31 MED ORDER — AMLODIPINE BESYLATE 5 MG PO TABS
5.0000 mg | ORAL_TABLET | Freq: Every day | ORAL | Status: DC
  Administered 2013-11-01: 5 mg via ORAL
  Filled 2013-10-31: qty 1

## 2013-10-31 MED ORDER — LACTATED RINGERS IV SOLN
INTRAVENOUS | Status: DC
  Administered 2013-10-31: 14:00:00 via INTRAVENOUS

## 2013-10-31 MED ORDER — ROCURONIUM BROMIDE 100 MG/10ML IV SOLN
INTRAVENOUS | Status: DC | PRN
  Administered 2013-10-31: 30 mg via INTRAVENOUS

## 2013-10-31 MED ORDER — PROMETHAZINE HCL 25 MG/ML IJ SOLN: 6.2500 mg | INTRAMUSCULAR | Status: DC | PRN

## 2013-10-31 MED ORDER — PROPOFOL 10 MG/ML IV BOLUS
INTRAVENOUS | Status: DC | PRN
  Administered 2013-10-31: 100 mg via INTRAVENOUS

## 2013-10-31 MED ORDER — GLYCOPYRROLATE 0.2 MG/ML IJ SOLN
INTRAMUSCULAR | Status: DC | PRN
  Administered 2013-10-31: .8 mg via INTRAVENOUS

## 2013-10-31 MED ORDER — FENTANYL CITRATE 0.05 MG/ML IJ SOLN
INTRAMUSCULAR | Status: DC | PRN
  Administered 2013-10-31: 50 ug via INTRAVENOUS
  Administered 2013-10-31: 100 ug via INTRAVENOUS
  Administered 2013-10-31 (×2): 50 ug via INTRAVENOUS

## 2013-10-31 MED ORDER — ONDANSETRON HCL 4 MG PO TABS: 4.0000 mg | ORAL_TABLET | Freq: Four times a day (QID) | ORAL | Status: DC | PRN

## 2013-10-31 MED ORDER — LIDOCAINE HCL (CARDIAC) 20 MG/ML IV SOLN
INTRAVENOUS | Status: AC
  Filled 2013-10-31: qty 5

## 2013-10-31 MED ORDER — OXYCODONE-ACETAMINOPHEN 5-325 MG PO TABS
1.0000 | ORAL_TABLET | ORAL | Status: DC | PRN
  Administered 2013-11-01 (×4): 2 via ORAL
  Filled 2013-10-31 (×4): qty 2

## 2013-10-31 MED ORDER — SERTRALINE HCL 25 MG PO TABS
25.0000 mg | ORAL_TABLET | Freq: Every morning | ORAL | Status: DC
  Administered 2013-11-01: 25 mg via ORAL
  Filled 2013-10-31: qty 1

## 2013-10-31 MED ORDER — NEOSTIGMINE METHYLSULFATE 10 MG/10ML IV SOLN
INTRAVENOUS | Status: DC | PRN
  Administered 2013-10-31: 4 mg via INTRAVENOUS

## 2013-10-31 MED ORDER — FUROSEMIDE 20 MG PO TABS
20.0000 mg | ORAL_TABLET | Freq: Every day | ORAL | Status: DC
  Administered 2013-11-01: 20 mg via ORAL
  Filled 2013-10-31: qty 1

## 2013-10-31 MED ORDER — ASPIRIN EC 325 MG PO TBEC
325.0000 mg | DELAYED_RELEASE_TABLET | Freq: Every day | ORAL | Status: DC
  Administered 2013-10-31 – 2013-11-01 (×2): 325 mg via ORAL
  Filled 2013-10-31 (×2): qty 1

## 2013-10-31 MED ORDER — FLEET ENEMA 7-19 GM/118ML RE ENEM: 1.0000 | ENEMA | Freq: Once | RECTAL | Status: AC | PRN

## 2013-10-31 MED ORDER — DEXAMETHASONE SODIUM PHOSPHATE 4 MG/ML IJ SOLN
INTRAMUSCULAR | Status: DC | PRN
  Administered 2013-10-31: 10 mg via INTRAVENOUS

## 2013-10-31 MED ORDER — ROCURONIUM BROMIDE 50 MG/5ML IV SOLN
INTRAVENOUS | Status: AC
  Filled 2013-10-31: qty 1

## 2013-10-31 MED ORDER — 0.9 % SODIUM CHLORIDE (POUR BTL) OPTIME
TOPICAL | Status: DC | PRN
  Administered 2013-10-31: 1000 mL

## 2013-10-31 MED ORDER — PHENYLEPHRINE HCL 10 MG/ML IJ SOLN
10.0000 mg | INTRAMUSCULAR | Status: DC | PRN
  Administered 2013-10-31: 25 ug/min via INTRAVENOUS

## 2013-10-31 MED ORDER — BISACODYL 10 MG RE SUPP: 10.0000 mg | Freq: Every day | RECTAL | Status: DC | PRN

## 2013-10-31 MED ORDER — MORPHINE SULFATE 2 MG/ML IJ SOLN
1.0000 mg | INTRAMUSCULAR | Status: DC | PRN
  Administered 2013-10-31 (×2): 1 mg via INTRAVENOUS
  Filled 2013-10-31 (×2): qty 1

## 2013-10-31 MED ORDER — ONDANSETRON HCL 4 MG/2ML IJ SOLN
INTRAMUSCULAR | Status: AC
  Filled 2013-10-31: qty 2

## 2013-10-31 MED ORDER — DOCUSATE SODIUM 100 MG PO CAPS
100.0000 mg | ORAL_CAPSULE | Freq: Two times a day (BID) | ORAL | Status: DC
  Administered 2013-10-31 – 2013-11-01 (×2): 100 mg via ORAL
  Filled 2013-10-31 (×2): qty 1

## 2013-10-31 MED ORDER — METOCLOPRAMIDE HCL 10 MG PO TABS: 5.0000 mg | ORAL_TABLET | Freq: Three times a day (TID) | ORAL | Status: DC | PRN

## 2013-10-31 MED ORDER — PROPOFOL 10 MG/ML IV BOLUS
INTRAVENOUS | Status: AC
  Filled 2013-10-31: qty 20

## 2013-10-31 MED ORDER — LACTATED RINGERS IV SOLN
INTRAVENOUS | Status: DC | PRN
  Administered 2013-10-31 (×2): via INTRAVENOUS

## 2013-10-31 MED ORDER — HYDROMORPHONE HCL 1 MG/ML IJ SOLN
INTRAMUSCULAR | Status: AC
  Filled 2013-10-31: qty 1

## 2013-10-31 MED ORDER — HYDROMORPHONE HCL 1 MG/ML IJ SOLN
0.2500 mg | INTRAMUSCULAR | Status: DC | PRN
  Administered 2013-10-31 (×2): 0.5 mg via INTRAVENOUS

## 2013-10-31 MED ORDER — OXYCODONE-ACETAMINOPHEN 5-325 MG PO TABS: 1.0000 | ORAL_TABLET | ORAL | Status: DC | PRN

## 2013-10-31 MED ORDER — NEBIVOLOL HCL 10 MG PO TABS
10.0000 mg | ORAL_TABLET | Freq: Every day | ORAL | Status: DC
  Administered 2013-11-01: 10 mg via ORAL
  Filled 2013-10-31: qty 1

## 2013-10-31 MED ORDER — FENTANYL CITRATE 0.05 MG/ML IJ SOLN
INTRAMUSCULAR | Status: AC
  Filled 2013-10-31: qty 5

## 2013-10-31 MED ORDER — FENTANYL CITRATE 0.05 MG/ML IJ SOLN
INTRAMUSCULAR | Status: AC
  Administered 2013-10-31: 100 ug
  Filled 2013-10-31: qty 2

## 2013-10-31 MED ORDER — ALPRAZOLAM 0.25 MG PO TABS
0.1250 mg | ORAL_TABLET | Freq: Every day | ORAL | Status: DC | PRN
  Administered 2013-10-31: 0.125 mg via ORAL
  Filled 2013-10-31: qty 1

## 2013-10-31 MED ORDER — ONDANSETRON HCL 4 MG/2ML IJ SOLN
INTRAMUSCULAR | Status: DC | PRN
  Administered 2013-10-31: 4 mg via INTRAVENOUS

## 2013-10-31 MED ORDER — OXYCODONE HCL 5 MG PO TABS: 5.0000 mg | ORAL_TABLET | Freq: Once | ORAL | Status: DC | PRN

## 2013-10-31 MED ORDER — PRAVASTATIN SODIUM 20 MG PO TABS
20.0000 mg | ORAL_TABLET | Freq: Every evening | ORAL | Status: DC
  Administered 2013-10-31: 20 mg via ORAL
  Filled 2013-10-31 (×2): qty 1

## 2013-10-31 MED ORDER — ADULT MULTIVITAMIN W/MINERALS CH
1.0000 | ORAL_TABLET | Freq: Every day | ORAL | Status: DC
  Administered 2013-10-31 – 2013-11-01 (×2): 1 via ORAL
  Filled 2013-10-31 (×2): qty 1

## 2013-10-31 MED ORDER — KCL IN DEXTROSE-NACL 20-5-0.45 MEQ/L-%-% IV SOLN
INTRAVENOUS | Status: DC
  Administered 2013-10-31: 21:00:00 via INTRAVENOUS
  Filled 2013-10-31 (×3): qty 1000

## 2013-10-31 MED ORDER — METOCLOPRAMIDE HCL 5 MG/ML IJ SOLN: 5.0000 mg | Freq: Three times a day (TID) | INTRAMUSCULAR | Status: DC | PRN

## 2013-10-31 MED ORDER — CEFAZOLIN SODIUM-DEXTROSE 2-3 GM-% IV SOLR
INTRAVENOUS | Status: DC | PRN
  Administered 2013-10-31: 2 g via INTRAVENOUS

## 2013-10-31 MED ORDER — OXYCODONE HCL 5 MG/5ML PO SOLN: 5.0000 mg | Freq: Once | ORAL | Status: DC | PRN

## 2013-10-31 MED ORDER — SENNOSIDES-DOCUSATE SODIUM 8.6-50 MG PO TABS: 1.0000 | ORAL_TABLET | Freq: Every evening | ORAL | Status: DC | PRN

## 2013-10-31 SURGICAL SUPPLY — 57 items
APL SKNCLS STERI-STRIP NONHPOA (GAUZE/BANDAGES/DRESSINGS)
BENZOIN TINCTURE PRP APPL 2/3 (GAUZE/BANDAGES/DRESSINGS) IMPLANT
BIT DRILL 2.8X4 QC CORT (BIT) ×3 IMPLANT
BIT DRILL 4 LONG FAST STEP (BIT) ×3 IMPLANT
BIT DRILL 4 SHORT FAST STEP (BIT) ×3 IMPLANT
CLOSURE WOUND 1/2 X4 (GAUZE/BANDAGES/DRESSINGS) ×1
COVER SURGICAL LIGHT HANDLE (MISCELLANEOUS) ×6 IMPLANT
DRAPE C-ARM 42X72 X-RAY (DRAPES) ×6 IMPLANT
DRAPE INCISE IOBAN 66X45 STRL (DRAPES) IMPLANT
DRAPE SURG 17X23 STRL (DRAPES) IMPLANT
DRAPE U-SHAPE 47X51 STRL (DRAPES) ×3 IMPLANT
DRSG EMULSION OIL 3X3 NADH (GAUZE/BANDAGES/DRESSINGS) ×3 IMPLANT
ELECT REM PT RETURN 9FT ADLT (ELECTROSURGICAL) ×3
ELECTRODE REM PT RTRN 9FT ADLT (ELECTROSURGICAL) ×1 IMPLANT
GAUZE SPONGE 4X4 12PLY STRL (GAUZE/BANDAGES/DRESSINGS) ×3 IMPLANT
GAUZE XEROFORM 5X9 LF (GAUZE/BANDAGES/DRESSINGS) ×3 IMPLANT
GLOVE BIOGEL PI IND STRL 7.5 (GLOVE) ×1 IMPLANT
GLOVE BIOGEL PI IND STRL 8 (GLOVE) ×1 IMPLANT
GLOVE BIOGEL PI INDICATOR 7.5 (GLOVE) ×2
GLOVE BIOGEL PI INDICATOR 8 (GLOVE) ×2
GLOVE ECLIPSE 7.0 STRL STRAW (GLOVE) ×3 IMPLANT
GLOVE ORTHO TXT STRL SZ7.5 (GLOVE) ×3 IMPLANT
GLOVE SURG SS PI 6.5 STRL IVOR (GLOVE) ×3 IMPLANT
GLOVE SURG SS PI 7.0 STRL IVOR (GLOVE) ×3 IMPLANT
GOWN STRL REUS W/ TWL LRG LVL3 (GOWN DISPOSABLE) ×1 IMPLANT
GOWN STRL REUS W/TWL LRG LVL3 (GOWN DISPOSABLE) ×3
GOWN STRL REUS W/TWL XL LVL4 (GOWN DISPOSABLE) ×6 IMPLANT
KIT BASIN OR (CUSTOM PROCEDURE TRAY) ×3 IMPLANT
KIT ROOM TURNOVER OR (KITS) ×3 IMPLANT
NEEDLE HYPO 25GX1X1/2 BEV (NEEDLE) IMPLANT
NS IRRIG 1000ML POUR BTL (IV SOLUTION) ×3 IMPLANT
PACK SHOULDER (CUSTOM PROCEDURE TRAY) ×3 IMPLANT
PAD ABD 8X10 STRL (GAUZE/BANDAGES/DRESSINGS) ×3 IMPLANT
PAD ARMBOARD 7.5X6 YLW CONV (MISCELLANEOUS) ×6 IMPLANT
PEG STND 4.0X35MM (Orthopedic Implant) ×3 IMPLANT
PEG STND 4.0X40MM (Orthopedic Implant) ×3 IMPLANT
PEG STND 4.0X45.0MM (Orthopedic Implant) ×6 IMPLANT
PEG STND 4.0X50.0MM (Orthopedic Implant) ×3 IMPLANT
PEG STND 4.0X55.0MM (Orthopedic Implant) ×3 IMPLANT
PEGSTD 4.0X35MM (Orthopedic Implant) ×1 IMPLANT
PEGSTD 4.0X40MM (Orthopedic Implant) ×1 IMPLANT
PEGSTD 4.0X45.0MM (Orthopedic Implant) ×2 IMPLANT
PEGSTD 4.0X50.0MM (Orthopedic Implant) ×1 IMPLANT
PLATE SHOULDER S3 3HOLE RT (Plate) ×3 IMPLANT
SCREW LOCK 90D ANGLED 3.8X26 (Screw) ×3 IMPLANT
SCREW MULTIDIR SS 3.8X28 HUMRL (Screw) ×3 IMPLANT
SPONGE GAUZE 4X4 12PLY STER LF (GAUZE/BANDAGES/DRESSINGS) ×3 IMPLANT
SPONGE LAP 18X18 X RAY DECT (DISPOSABLE) IMPLANT
STRIP CLOSURE SKIN 1/2X4 (GAUZE/BANDAGES/DRESSINGS) ×2 IMPLANT
SUCTION FRAZIER TIP 10 FR DISP (SUCTIONS) ×3 IMPLANT
SUT FIBERWIRE #2 38 T-5 BLUE (SUTURE)
SUT VIC AB 2-0 CT1 27 (SUTURE) ×3
SUT VIC AB 2-0 CT1 TAPERPNT 27 (SUTURE) ×1 IMPLANT
SUT VIC AB 3-0 FS2 27 (SUTURE) IMPLANT
SUTURE FIBERWR #2 38 T-5 BLUE (SUTURE) IMPLANT
SYR CONTROL 10ML LL (SYRINGE) IMPLANT
WATER STERILE IRR 1000ML POUR (IV SOLUTION) ×3 IMPLANT

## 2013-10-31 NOTE — Discharge Instructions (Signed)
No ROM of right shoulder.  Wear shoulder immobilizer at all times.  Ice packs daily or as needed.  Change dressing daily or as needed. Move hand and fingers frequently

## 2013-10-31 NOTE — Anesthesia Procedure Notes (Signed)
Anesthesia Regional Block:  Interscalene brachial plexus block  Pre-Anesthetic Checklist: ,, timeout performed, Correct Patient, Correct Site, Correct Laterality, Correct Procedure, Correct Position, site marked, Risks and benefits discussed,  Surgical consent,  Pre-op evaluation,  At surgeon's request and post-op pain management  Laterality: Right  Prep: Maximum Sterile Barrier Precautions used, chloraprep and alcohol swabs       Needles:  Injection technique: Single-shot  Needle Type: Stimulator Needle - 40        Needle insertion depth: 4 cm   Additional Needles:  Procedures: nerve stimulator Interscalene brachial plexus block  Nerve Stimulator or Paresthesia:  Response: 0.5 mA, 0.1 ms, 4 cm  Additional Responses:   Narrative:  Start time: 10/31/2013 1:40 PM End time: 10/31/2013 1:47 PM Injection made incrementally with aspirations every 5 mL.  Performed by: Personally  Anesthesiologist: Sharolyn Douglas MD  Additional Notes: Pt accepts procedure w/ risks. 17cc 0.5% Marcaine w/ epi w/o difficulty or discomfort. GES

## 2013-10-31 NOTE — Anesthesia Preprocedure Evaluation (Addendum)
Anesthesia Evaluation  Patient identified by MRN, date of birth, ID band Patient awake    Reviewed: Allergy & Precautions, H&P , NPO status , Patient's Chart, lab work & pertinent test results  Airway Mallampati: II TM Distance: >3 FB Neck ROM: Full    Dental  (+) Teeth Intact, Dental Advisory Given   Pulmonary    Pulmonary exam normal       Cardiovascular hypertension, +CHF + dysrhythmias + Valvular Problems/Murmurs AS and MR Rhythm:Regular Rate:Normal + Systolic murmurs mild as of echo 09/03/2013 Left ventricle: The cavity size was normal. Wall thickness was   increased in a pattern of mild LVH. Systolic function was normal.   The estimated ejection fraction was in the range of 55% to 60%.   Doppler parameters are consistent with abnormal left ventricular   relaxation (grade 1 diastolic dysfunction). - Aortic valve: Moderately calcified annulus. There was mild   stenosis. There was trivial regurgitation. Valve area (VTI): 1.32   cm^2.    Neuro/Psych Anxiety    GI/Hepatic negative GI ROS, Neg liver ROS,   Endo/Other  Hypothyroidism   Renal/GU Renal disease     Musculoskeletal  (+) Arthritis -,   Abdominal   Peds  Hematology   Anesthesia Other Findings   Reproductive/Obstetrics                       Anesthesia Physical Anesthesia Plan  ASA: III  Anesthesia Plan: General   Post-op Pain Management:    Induction: Intravenous  Airway Management Planned: Oral ETT  Additional Equipment:   Intra-op Plan:   Post-operative Plan: Extubation in OR  Informed Consent: I have reviewed the patients History and Physical, chart, labs and discussed the procedure including the risks, benefits and alternatives for the proposed anesthesia with the patient or authorized representative who has indicated his/her understanding and acceptance.   Dental advisory given  Plan Discussed with: CRNA,  Anesthesiologist and Surgeon  Anesthesia Plan Comments:        Anesthesia Quick Evaluation

## 2013-10-31 NOTE — Anesthesia Postprocedure Evaluation (Signed)
  Anesthesia Post-op Note  Patient: [name]  Procedure(s) Performed: Procedure(s): Open Reduction Internal Fixation Right Proximal Humerus Fracture (Right)  Patient Location: PACU  Anesthesia Type:General  Level of Consciousness: awake  Airway and Oxygen Therapy: Patient Spontanous Breathing  Post-op Pain: mild  Post-op Assessment: Post-op Vital signs reviewed  Post-op Vital Signs: Reviewed  Last Vitals:  Filed Vitals:   10/31/13 1942  BP:   Pulse: 71  Temp: 36.3 C  Resp: 12    Complications: No apparent anesthesia complications

## 2013-10-31 NOTE — Interval H&P Note (Signed)
History and Physical Interval Note:  10/31/2013 3:33 PM  Stollings  has presented today for surgery, with the diagnosis of Right Proximal Humerus Fracture  The various methods of treatment have been discussed with the patient and family. After consideration of risks, benefits and other options for treatment, the patient has consented to  Procedure(s): Open Reduction Internal Fixation Right Proximal Humerus Fracture (Right) as a surgical intervention .  The patient's history has been reviewed, patient examined, no change in status, stable for surgery.  I have reviewed the patient's chart and labs.  Questions were answered to the patient's satisfaction.     Viveka Wilmeth C

## 2013-10-31 NOTE — H&P (Signed)
[name] is an 80 y.o. female.   Chief Complaint: right proximal humerus fracture HPI: Pt fell on 10/13 while walking in the woods.  She tripped on a tree stump.  She was seen at an Urgent Care and placed in a sling.  She was also seen in the ED and referred to Dr Lorin Mercy for further evaluation.  Pt with displaced proximal humerus fracture which will require open ORIF.  She has consented for the procedure.  Past Medical History  Diagnosis Date  . Hypertension   . Hyperlipidemia   . Tachycardia     history of tachycardia and bradycardia  . Hypothyroidism   . Dyspnea   . Diastolic dysfunction     mild   . Mild mitral regurgitation by prior echocardiogram   . Bradycardia     hospitalized for   . Arthritis   . Dysrhythmia     irregular heartrate  . Anxiety   . Kidney stones   . Aortic stenosis     mild as of echo 09/03/2013    Past Surgical History  Procedure Laterality Date  . Total abdominal hysterectomy    . Cholecystectomy    . Extracorporeal shock wave lithotripsy    . Tonsillectomy    . Eye surgery Bilateral     cataract surgery with lens implant  . Appendectomy      Family History  Problem Relation Age of Onset  . Emphysema Father   . Heart failure Mother   . Heart attack Brother   . Cancer Brother     kidney  . Stroke Brother    Social History:  reports that she has never smoked. She has never used smokeless tobacco. She reports that she does not drink alcohol or use illicit drugs.  Allergies: No Known Allergies  Medications Prior to Admission  Medication Sig Dispense Refill  . ALPRAZolam (XANAX) 0.25 MG tablet Take 0.125 mg by mouth daily as needed for anxiety.       . furosemide (LASIX) 20 MG tablet Take 1 tablet (20 mg total) by mouth daily.  30 tablet  1  . Multiple Vitamin (MULTIVITAMIN WITH MINERALS) TABS tablet Take 1 tablet by mouth daily.      . nebivolol (BYSTOLIC) 10 MG tablet Take 1 tablet (10 mg total) by mouth daily.  30 tablet  1  .  oxyCODONE-acetaminophen (PERCOCET) 5-325 MG per tablet Take 1 tablet by mouth every 4 (four) hours as needed for severe pain.  20 tablet  0  . pravastatin (PRAVACHOL) 20 MG tablet Take 20 mg by mouth every evening.       . sertraline (ZOLOFT) 25 MG tablet Take 25 mg by mouth every morning.       . Multiple Vitamins-Minerals (PRESERVISION AREDS) CAPS Take 1 capsule by mouth 2 (two) times daily.      [name] oxyCODONE-acetaminophen (PERCOCET/ROXICET) 5-325 MG per tablet Take 1 tablet by mouth every 4 (four) hours as needed for moderate pain or severe pain.  30 tablet  0    Results for orders placed during the hospital encounter of 10/31/13 (from the past 48 hour(s))  CBC     Status: Abnormal   Collection Time    10/31/13  1:12 PM      Result Value Ref Range   WBC 9.7  4.0 - 10.5 K/uL   RBC 3.59 (*) 3.87 - 5.11 MIL/uL   Hemoglobin 11.2 (*) 12.0 - 15.0 g/dL   HCT 33.3 (*) 36.0 - 46.0 %  MCV 92.8  78.0 - 100.0 fL   MCH 31.2  26.0 - 34.0 pg   MCHC 33.6  30.0 - 36.0 g/dL   RDW 14.1  11.5 - 15.5 %   Platelets 337  150 - 400 K/uL   No results found.  Review of Systems  Musculoskeletal:       Right arm pain  All other systems reviewed and are negative.   Blood pressure 132/46, pulse 72, temperature 97.8 F (36.6 C), temperature source Oral, resp. rate 15, height 5\' 4"  (1.626 m), weight 68.947 kg (152 lb), SpO2 98.00%. Physical Exam  Constitutional: She appears well-developed and well-nourished.  HENT:  Head: Normocephalic and atraumatic.  Eyes: EOM are normal. Pupils are equal, round, and reactive to light.  Cardiovascular: Normal rate.   Respiratory: Breath sounds normal. She has no wheezes. She has no rales.  GI: Soft.  Musculoskeletal:  NV and motor distal right UE intact.  Ecchymosis and edema of shoulder and upper arm.  Pain with motion.  Neurological: She is alert.  Skin: Skin is warm.  Right UE eccymosis due to fracture     Assessment/Plan Displaced right proximal humerus  fracture  PLAN:  ORIF right proximal humerus fracture.  VERNON,SHEILA M 10/31/2013, 1:31 PM

## 2013-10-31 NOTE — Transfer of Care (Signed)
Immediate Anesthesia Transfer of Care Note  Patient: [name]  Procedure(s) Performed: Procedure(s): Open Reduction Internal Fixation Right Proximal Humerus Fracture (Right)  Patient Location: PACU  Anesthesia Type:General  Level of Consciousness: awake, alert  and oriented  Airway & Oxygen Therapy: Patient Spontanous Breathing  Post-op Assessment: Report given to PACU RN  Post vital signs: Reviewed and stable  Complications: No apparent anesthesia complications

## 2013-11-01 ENCOUNTER — Encounter (HOSPITAL_COMMUNITY): Payer: Self-pay | Admitting: Orthopaedic Surgery

## 2013-11-01 DIAGNOSIS — S42201A Unspecified fracture of upper end of right humerus, initial encounter for closed fracture: Secondary | ICD-10-CM | POA: Diagnosis not present

## 2013-11-01 NOTE — Progress Notes (Addendum)
Occupational Therapy Treatment Patient Details [name] MRN: [id number] DOB: [birth year] Today's Date: 11/01/2013    History of present illness 80 y.o. s/p Open Reduction Internal Fixation Right Proximal Humerus Fracture.   OT comments  Discussed d/c plans and recommend someone be with pt 24/7 and will follow up with MD in one week. Reviewed shoulder information with pt and spouse.   Follow Up Recommendations  Other (comment);Supervision/Assistance - 24 hour (follow up with MD)    Equipment Recommendations  3 in 1 bedside comode    Recommendations for Other Services      Precautions / Restrictions Precautions Precautions: Fall Precaution Comments: no shoulder movement; no pushing, pulling, lifting Required Braces or Orthoses: Sling Restrictions Weight Bearing Restrictions: Yes RUE Weight Bearing: Non weight bearing       Mobility Bed Mobility Overal bed mobility: Needs Assistance Bed Mobility: Supine to Sit     Supine to sit: Min assist        Transfers Overall transfer level: Needs assistance   Transfers: Sit to/from Stand Sit to Stand: Min guard              Balance                                   ADL Overall ADL's : Needs assistance/impaired                         Toilet Transfer: Min guard (sit <> stand transfer from bed)             General ADL Comments: Spouse practiced donning/doffing sling and donning shirt. Pt performed exercises.      Vision                     Perception     Praxis      Cognition  Awake/Alert Behavior During Therapy: WFL for tasks assessed/performed Overall Cognitive Status: Within Functional Limits for tasks assessed                       Extremity/Trunk Assessment               Exercises Other Exercises Other Exercises: Performed right elbow flexion/extension, wrist flexion/extension, and left digit composite flexion/extension (approximately 15  reps each) Donning/doffing shirt without moving shoulder: Minimal assistance Method for sponge bathing under operated UE:  (reviewed) Donning/doffing sling/immobilizer: Supervision/safety Correct positioning of sling/immobilizer: Minimal assistance ROM for elbow, wrist and digits of operated UE: Minimal assistance Sling wearing schedule (on at all times/off for ADL's):  (reviewed) Proper positioning of operated UE when showering: Patient able to independently direct caregiver Positioning of UE while sleeping:  (reviewed)   Shoulder Instructions Shoulder Instructions Donning/doffing shirt without moving shoulder: Minimal assistance Method for sponge bathing under operated UE:  (reviewed) Donning/doffing sling/immobilizer: Supervision/safety Correct positioning of sling/immobilizer: Minimal assistance ROM for elbow, wrist and digits of operated UE: Minimal assistance-encouraged to be moving neck around Sling wearing schedule (on at all times/off for ADL's):  (reviewed) Proper positioning of operated UE when showering: Patient able to independently direct caregiver Positioning of UE while sleeping:  (reviewed)     General Comments      Pertinent Vitals/ Pain       Pain Assessment: 0-10 Pain Score: 7  Pain Location: RUE Pain Intervention(s): Monitored during session  Home Living  Prior Functioning/Environment              Frequency Min 2X/week     Progress Toward Goals  OT Goals(current goals can now be found in the care plan section)  Progress towards OT goals: Progressing toward goals  Acute Rehab OT Goals Patient Stated Goal: not stated OT Goal Formulation: With patient Time For Goal Achievement: 11/08/13 Potential to Achieve Goals: Good ADL Goals Additional ADL Goal #1: Caregiver will be independent with assisting pt with ADLs while maintaining shoulder precautions. Additional ADL Goal #2: Pt will be  independent with HEP for RUE.  Plan Discharge plan remains appropriate    Co-evaluation                 End of Session Equipment Utilized During Treatment: Other (comment) (sling)   Activity Tolerance Patient tolerated treatment well   Patient Left in bed;with family/visitor present   Nurse Communication Other (comment) (needs 3 in 1)        Time: 1410-1431 OT Time Calculation (min): 21 min  Charges: OT General Charges $OT Visit: 1 Procedure OT Treatments $Self Care/Home Management : 8-22 mins  [name] OTR/L [phone] 11/01/2013, 6:35 PM

## 2013-11-01 NOTE — Progress Notes (Signed)
Subjective: 1 Day Post-Op Procedure(s) (LRB): Open Reduction Internal Fixation Right Proximal Humerus Fracture (Right) Patient reports pain as 6 on 0-10 scale.    Objective: Vital signs in last 24 hours: Temp:  [97 F (36.1 C)-98.6 F (37 C)] 98.6 F (37 C) (10/20 0504) Pulse Rate:  [71-84] 73 (10/20 0504) Resp:  [10-22] 14 (10/20 0504) BP: (104-164)/(34-107) 126/45 mmHg (10/20 0504) SpO2:  [94 %-100 %] 96 % (10/20 0504) Weight:  [68.947 kg (152 lb)] 68.947 kg (152 lb) (10/19 1220)  Intake/Output from previous day: 10/19 0701 - 10/20 0700 In: 2140 [P.O.:480; I.V.:1660] Out: 100 [Blood:100] Intake/Output this shift:     Recent Labs  10/31/13 1312  HGB 11.2*    Recent Labs  10/31/13 1312  WBC 9.7  RBC 3.59*  HCT 33.3*  PLT 337    Recent Labs  10/31/13 1312  NA 138  K 4.5  CL 101  CO2 23  BUN 12  CREATININE 0.59  GLUCOSE 99  CALCIUM 9.4    Recent Labs  10/31/13 1312  INR 1.05    Neurologically intact  Assessment/Plan: 1 Day Post-Op Procedure(s) (LRB): Open Reduction Internal Fixation Right Proximal Humerus Fracture (Right) Plan discharge home. Block has worn off , pain controlled with PO pain meds.   Karilynn Carranza C 11/01/2013, 7:14 AM

## 2013-11-01 NOTE — Op Note (Signed)
[name], [name]                   ACCOUNT NO.:  [id number]  MEDICAL RECORD NO.:  [id number]  LOCATION:  5N04C                        FACILITY:  [facility]  PHYSICIAN:  Thyra Yinger C. Lorin Mercy, M.D.    DATE OF BIRTH:  11-15-1921  DATE OF PROCEDURE:  10/31/2013 DATE OF DISCHARGE:                              OPERATIVE REPORT   PREOPERATIVE DIAGNOSIS:  Right proximal humerus fracture, comminuted.  POSTOPERATIVE DIAGNOSIS:  Right proximal humerus fracture, comminuted.  PROCEDURE:  Open reduction and internal fixation, right proximal humerus, Biomet anatomic plate, distal 3-hole.  SURGEON:  Amyra Vantuyl C. Lorin Mercy, MD  ASSISTANT:  Phillips Hay, PA-C, medically necessary and present for the entire procedure.  ESTIMATED BLOOD LOSS:  Less than 100 mL.  DESCRIPTION OF PROCEDURE:  After induction of general anesthesia, orotracheal intubation with preoperative scalene block for postoperative pain, analgesia.  There was a good block.  Arm was prepped with DuraPrep.  Ancef was given prophylactically.  Time-out procedure was completed.  Prepping and draping was performed.  Split sheets, drapes, impervious stockinette, Coban, sterile skin marker, Betadine, Steri- Drape, sealing the skin and axilla.  Deltopectoral incision was made. Cephalic vein taken with the arm.  Subperiosteal dissection of the fracture, distraction, reduction, checking under fluoroscopy which was draped and brought in over the top like a rainbow.  Texture distraction needed to be performed in order to get it adequately reduced.  Once it was reduced, 3-hole plate was selected and a K-wire was placed centrally up central hole, holding the head and then distal most screw was placed onto the shaft.  Holes were drilled, checked for depth and drilled the rest away and 45 and 50 mm pegs were used primarily with short pegs for the top hole and anterior-inferior hole.  Second bicortical screw was placed in the shaft.  C-arm was turned on, shoulder was  internally and externally rotated, flexed and extended to make sure that all pegs were appropriate length, none were in the joint.  There was good stability of the fracture, irrigation with saline solution.  2-0 Vicryl subcutaneous tissue, skin staple closure.  Postop dressing with Xeroform, 4x4s, ABD, and tape, ABD in the axilla and then re-application of the sling.  The patient tolerated the procedure well, transferred to recovery room in stable condition.     Shona Pardo C. Lorin Mercy, M.D.     MCY/MEDQ  D:  10/31/2013  T:  11/01/2013  Job:  [id number]

## 2013-11-01 NOTE — Progress Notes (Signed)
UR completed 

## 2013-11-01 NOTE — Evaluation (Addendum)
Occupational Therapy Evaluation Patient Details [name] MRN: [id number] DOB: January 22, 1921 Today's Date: 11/01/2013    History of Present Illness 80 y.o. s/p Open Reduction Internal Fixation Right Proximal Humerus Fracture.   Clinical Impression   Pt s/p above. Pt independent with ADLs, PTA. Planning to see pt for one more session prior to d/c home. Education provided to pt and spouse.     Follow Up Recommendations  Other (comment);Supervision/Assistance - 24 hour (follow up with MD)    Equipment Recommendations  3 in 1 bedside comode    Recommendations for Other Services       Precautions / Restrictions Precautions Precautions: Fall Precaution Comments: no shoulder movement; no pushing, pulling, lifting Required Braces or Orthoses: Sling Restrictions Weight Bearing Restrictions: Yes RUE Weight Bearing: Non weight bearing      Mobility Bed Mobility Overal bed mobility: Needs Assistance Bed Mobility: Supine to Sit     Supine to sit: Min assist     General bed mobility comments: assist with trunk.  Transfers Overall transfer level: Needs assistance   Transfers: Sit to/from Stand Sit to Stand: Min guard         General transfer comment: cues for hand placement.    Balance                                            ADL Overall ADL's : Needs assistance/impaired                     Lower Body Dressing: Moderate assistance;Sit to/from stand   Toilet Transfer: Min guard;Ambulation;Comfort height toilet   Toileting- Clothing Manipulation and Hygiene: Minimal assistance;Sit to/from stand         General ADL Comments: Discussed 3 in 1 to use as shower chair. Reviewed technique for ADLs and how to donn/doff sling. Performed a few exercises of elbow,wrist, hand.     Vision                     Perception     Praxis      Pertinent Vitals/Pain Pain Assessment: 0-10 Pain Score: 8  Pain Location: RUE Pain  Descriptors / Indicators: Aching Pain Intervention(s): Monitored during session;Limited activity within patient's tolerance     Hand Dominance Right   Extremity/Trunk Assessment Upper Extremity Assessment Upper Extremity Assessment: RUE deficits/detail RUE Deficits / Details: ORIF right proximal humerus fracture   Lower Extremity Assessment Lower Extremity Assessment: Overall WFL for tasks assessed       Communication Communication Communication: No difficulties   Cognition Arousal/Alertness: Awake/alert Behavior During Therapy: WFL for tasks assessed/performed Overall Cognitive Status: Within Functional Limits for tasks assessed                     General Comments       Exercises Exercises: Shoulder;Other exercises Other Exercises Other Exercises: Performed a few reps of elbow flexion/extension on RUE (approximately 5 reps), right wrist flexion/extension (approximately 10 reps), right digit composite flexion/extension (approximately 10 reps)   Shoulder Instructions Shoulder Instructions Donning/doffing shirt without moving shoulder:  (educated) Method for sponge bathing under operated UE:  (educated) Donning/doffing sling/immobilizer:  (educated/spouse assisted some) Correct positioning of sling/immobilizer:  (educated) ROM for elbow, wrist and digits of operated UE: Minimal assistance Sling wearing schedule (on at all times/off for ADL's):  (educated) Proper positioning of  operated UE when showering:  (educated) Positioning of UE while sleeping:  (educated on positioning of pillows)    [place] expects to be discharged to:: Other (Comment) (Independent Living-[facility]) Living Arrangements: Spouse/significant other                               Additional Comments: pt has elevator she can use to get to her room; tub/shower      Prior Functioning/Environment Level of Independence: Independent             OT  Diagnosis: Acute pain   OT Problem List: Impaired balance (sitting and/or standing);Decreased knowledge of use of DME or AE;Decreased knowledge of precautions;Pain;Impaired UE functional use;Decreased range of motion   OT Treatment/Interventions: Self-care/ADL training;Therapeutic exercise;DME and/or AE instruction;Therapeutic activities;Patient/family education;Balance training    OT Goals(Current goals can be found in the care plan section) Acute Rehab OT Goals Patient Stated Goal: not stated OT Goal Formulation: With patient Time For Goal Achievement: 11/08/13 Potential to Achieve Goals: Good ADL Goals Additional ADL Goal #1: Caregiver will be independent with assisting pt with ADLs while maintaining shoulder precautions. Additional ADL Goal #2: Pt will be independent with HEP for RUE.  OT Frequency: Min 2X/week   Barriers to D/C:            Co-evaluation              End of Session Equipment Utilized During Treatment: Gait belt;Other (comment) (sling)  Activity Tolerance: Patient tolerated treatment well Patient left: in bed;with family/visitor present   Time: 4920-1007 OT Time Calculation (min): 27 min Charges:  OT General Charges $OT Visit: 1 Procedure OT Evaluation $Initial OT Evaluation Tier I: 1 Procedure OT Treatments $Self Care/Home Management : 8-22 mins G-Codes: OT G-codes **NOT FOR INPATIENT CLASS** Functional Assessment Tool Used: clinical judgment Functional Limitation: Self care Self Care Current Status (H2197): At least 40 percent but less than 60 percent impaired, limited or restricted Self Care Goal Status (J8832): 0 percent impaired, limited or restricted  [name] OTR/L [phone] 11/01/2013, 1:59 PM

## 2013-11-08 DIAGNOSIS — S42291A Other displaced fracture of upper end of right humerus, initial encounter for closed fracture: Secondary | ICD-10-CM | POA: Diagnosis not present

## 2013-11-08 DIAGNOSIS — S42291D Other displaced fracture of upper end of right humerus, subsequent encounter for fracture with routine healing: Secondary | ICD-10-CM | POA: Diagnosis not present

## 2013-11-08 DIAGNOSIS — S42291S Other displaced fracture of upper end of right humerus, sequela: Secondary | ICD-10-CM | POA: Diagnosis not present

## 2013-11-08 NOTE — Discharge Summary (Signed)
Physician Discharge Summary  Patient ID: [name] MRN: [id number] DOB/AGE: 1921-07-29 80 y.o.  Admit date: 10/31/2013 Discharge date: 11/01/2013  Admission Diagnoses:  Fracture of humerus, proximal, right, closed  Discharge Diagnoses:  Principal Problem:   Fracture of humerus, proximal, right, closed Active Problems:   Proximal humerus fracture   Past Medical History  Diagnosis Date  . Hypertension   . Hyperlipidemia   . Tachycardia     history of tachycardia and bradycardia  . Hypothyroidism   . Dyspnea   . Diastolic dysfunction     mild   . Mild mitral regurgitation by prior echocardiogram   . Bradycardia     hospitalized for   . Arthritis   . Dysrhythmia     irregular heartrate  . Anxiety   . Kidney stones   . Aortic stenosis     mild as of echo 09/03/2013    Surgeries: Procedure(s): Open Reduction Internal Fixation Right Proximal Humerus Fracture on 10/31/2013   Consultants (if any):  NONE  Discharged Condition: Improved  Hospital Course: [name] is an 80 y.o. female who was admitted 10/31/2013 with a diagnosis of Fracture of humerus, proximal, right, closed and went to the operating room on 10/31/2013 and underwent the above named procedures.    She was given perioperative antibiotics:  Anti-infectives   Start     Dose/Rate Route Frequency Ordered Stop   10/30/13 1000  ceFAZolin (ANCEF) IVPB 2 g/50 mL premix  Status:  Discontinued     2 g 100 mL/hr over 30 Minutes Intravenous On call to O.R. 10/30/13 1000 10/31/13 2022    .  She was given sequential compression devices, early ambulation, and aspirin for DVT prophylaxis.  She benefited maximally from the hospital stay and there were no complications.    Recent vital signs:  Filed Vitals:   11/01/13 0504  BP: 126/45  Pulse: 73  Temp: 98.6 F (37 C)  Resp: 14    Recent laboratory studies:  Lab Results  Component Value Date   HGB 11.2* 10/31/2013   HGB 13.1 09/26/2013   HGB 13.0  09/24/2013   Lab Results  Component Value Date   WBC 9.7 10/31/2013   PLT 337 10/31/2013   Lab Results  Component Value Date   INR 1.05 10/31/2013   Lab Results  Component Value Date   NA 138 10/31/2013   K 4.5 10/31/2013   CL 101 10/31/2013   CO2 23 10/31/2013   BUN 12 10/31/2013   CREATININE 0.59 10/31/2013   GLUCOSE 99 10/31/2013    Discharge Medications:     Medication List         ALPRAZolam 0.25 MG tablet  Commonly known as:  XANAX  Take 0.125 mg by mouth daily as needed for anxiety.     amLODipine 5 MG tablet  Commonly known as:  NORVASC  Take 5 mg by mouth daily.     furosemide 20 MG tablet  Commonly known as:  LASIX  Take 1 tablet (20 mg total) by mouth daily.     levothyroxine 25 MCG tablet  Commonly known as:  SYNTHROID, LEVOTHROID  Take 50 mcg by mouth daily before breakfast.     multivitamin with minerals Tabs tablet  Take 1 tablet by mouth daily.     nebivolol 10 MG tablet  Commonly known as:  BYSTOLIC  Take 1 tablet (10 mg total) by mouth daily.     oxyCODONE-acetaminophen 5-325 MG per tablet  Commonly known as:  ROXICET  Take 1-2 tablets by mouth every 4 (four) hours as needed.     pravastatin 20 MG tablet  Commonly known as:  PRAVACHOL  Take 20 mg by mouth every evening.     PRESERVISION AREDS Caps  Take 1 capsule by mouth 2 (two) times daily.     sertraline 25 MG tablet  Commonly known as:  ZOLOFT  Take 25 mg by mouth every morning.        Diagnostic Studies: Dg Humerus Right  10/31/2013   CLINICAL DATA:  ORIF of right humerus fracture  EXAM: RIGHT HUMERUS - 2+ VIEW; DG C-ARM 61-120 MIN  COMPARISON:  None.  FINDINGS: Intraoperative views of the right shoulder demonstrate lateral cortical plate and multiple screws in the proximal humerus. Fracture fragments appear in near anatomic alignment. No complicating feature identified. No evidence of subluxation or dislocation of the shoulder on these views.  IMPRESSION: ORIF for proximal  humerus fracture. No complicating feature identified.   Electronically Signed   By: Curlene Dolphin M.D.   On: 10/31/2013 19:22   Dg C-arm 1-60 Min  10/31/2013   CLINICAL DATA:  ORIF of right humerus fracture  EXAM: RIGHT HUMERUS - 2+ VIEW; DG C-ARM 61-120 MIN  COMPARISON:  None.  FINDINGS: Intraoperative views of the right shoulder demonstrate lateral cortical plate and multiple screws in the proximal humerus. Fracture fragments appear in near anatomic alignment. No complicating feature identified. No evidence of subluxation or dislocation of the shoulder on these views.  IMPRESSION: ORIF for proximal humerus fracture. No complicating feature identified.   Electronically Signed   By: Curlene Dolphin M.D.   On: 10/31/2013 19:22    Disposition: 01-Home or Self Care  DISCHARGE INSTRUCTIONS:  No ROM of right shoulder.  Wear shoulder immobilizer at all times.  Ice packs daily or as needed.  Change dressing daily or as needed. Move hand and fingers frequently      Follow-up Information   Follow up with YATES,MARK C, MD. Schedule an appointment as soon as possible for a visit in 2 weeks.   Specialty:  Orthopedic Surgery   Contact information:   [place] [address] [phone]        Signed: Epimenio Foot 11/08/2013, 10:46 AM

## 2013-11-22 DIAGNOSIS — S42291A Other displaced fracture of upper end of right humerus, initial encounter for closed fracture: Secondary | ICD-10-CM | POA: Diagnosis not present

## 2013-11-22 DIAGNOSIS — S42291D Other displaced fracture of upper end of right humerus, subsequent encounter for fracture with routine healing: Secondary | ICD-10-CM | POA: Diagnosis not present

## 2013-11-22 DIAGNOSIS — S42291S Other displaced fracture of upper end of right humerus, sequela: Secondary | ICD-10-CM | POA: Diagnosis not present

## 2013-11-24 DIAGNOSIS — M6281 Muscle weakness (generalized): Secondary | ICD-10-CM | POA: Diagnosis not present

## 2013-11-24 DIAGNOSIS — S42351A Displaced comminuted fracture of shaft of humerus, right arm, initial encounter for closed fracture: Secondary | ICD-10-CM | POA: Diagnosis not present

## 2013-11-28 DIAGNOSIS — S42351A Displaced comminuted fracture of shaft of humerus, right arm, initial encounter for closed fracture: Secondary | ICD-10-CM | POA: Diagnosis not present

## 2013-11-28 DIAGNOSIS — M6281 Muscle weakness (generalized): Secondary | ICD-10-CM | POA: Diagnosis not present

## 2013-11-30 DIAGNOSIS — M6281 Muscle weakness (generalized): Secondary | ICD-10-CM | POA: Diagnosis not present

## 2013-11-30 DIAGNOSIS — S42351A Displaced comminuted fracture of shaft of humerus, right arm, initial encounter for closed fracture: Secondary | ICD-10-CM | POA: Diagnosis not present

## 2013-12-02 ENCOUNTER — Other Ambulatory Visit: Payer: Self-pay | Admitting: Cardiovascular Disease

## 2013-12-02 DIAGNOSIS — S42351A Displaced comminuted fracture of shaft of humerus, right arm, initial encounter for closed fracture: Secondary | ICD-10-CM | POA: Diagnosis not present

## 2013-12-02 DIAGNOSIS — M6281 Muscle weakness (generalized): Secondary | ICD-10-CM | POA: Diagnosis not present

## 2013-12-05 DIAGNOSIS — S42351A Displaced comminuted fracture of shaft of humerus, right arm, initial encounter for closed fracture: Secondary | ICD-10-CM | POA: Diagnosis not present

## 2013-12-05 DIAGNOSIS — M6281 Muscle weakness (generalized): Secondary | ICD-10-CM | POA: Diagnosis not present

## 2013-12-07 DIAGNOSIS — M6281 Muscle weakness (generalized): Secondary | ICD-10-CM | POA: Diagnosis not present

## 2013-12-07 DIAGNOSIS — S42351A Displaced comminuted fracture of shaft of humerus, right arm, initial encounter for closed fracture: Secondary | ICD-10-CM | POA: Diagnosis not present

## 2013-12-12 DIAGNOSIS — M6281 Muscle weakness (generalized): Secondary | ICD-10-CM | POA: Diagnosis not present

## 2013-12-12 DIAGNOSIS — S42351A Displaced comminuted fracture of shaft of humerus, right arm, initial encounter for closed fracture: Secondary | ICD-10-CM | POA: Diagnosis not present

## 2013-12-14 DIAGNOSIS — M810 Age-related osteoporosis without current pathological fracture: Secondary | ICD-10-CM | POA: Diagnosis not present

## 2013-12-14 DIAGNOSIS — S42351A Displaced comminuted fracture of shaft of humerus, right arm, initial encounter for closed fracture: Secondary | ICD-10-CM | POA: Diagnosis not present

## 2013-12-14 DIAGNOSIS — E780 Pure hypercholesterolemia: Secondary | ICD-10-CM | POA: Diagnosis not present

## 2013-12-14 DIAGNOSIS — M6281 Muscle weakness (generalized): Secondary | ICD-10-CM | POA: Diagnosis not present

## 2013-12-14 DIAGNOSIS — E039 Hypothyroidism, unspecified: Secondary | ICD-10-CM | POA: Diagnosis not present

## 2013-12-14 DIAGNOSIS — F329 Major depressive disorder, single episode, unspecified: Secondary | ICD-10-CM | POA: Diagnosis not present

## 2013-12-14 DIAGNOSIS — Z79899 Other long term (current) drug therapy: Secondary | ICD-10-CM | POA: Diagnosis not present

## 2013-12-14 DIAGNOSIS — I509 Heart failure, unspecified: Secondary | ICD-10-CM | POA: Diagnosis not present

## 2013-12-14 DIAGNOSIS — I10 Essential (primary) hypertension: Secondary | ICD-10-CM | POA: Diagnosis not present

## 2013-12-14 DIAGNOSIS — F411 Generalized anxiety disorder: Secondary | ICD-10-CM | POA: Diagnosis not present

## 2013-12-16 DIAGNOSIS — M6281 Muscle weakness (generalized): Secondary | ICD-10-CM | POA: Diagnosis not present

## 2013-12-16 DIAGNOSIS — S42351A Displaced comminuted fracture of shaft of humerus, right arm, initial encounter for closed fracture: Secondary | ICD-10-CM | POA: Diagnosis not present

## 2013-12-19 DIAGNOSIS — M6281 Muscle weakness (generalized): Secondary | ICD-10-CM | POA: Diagnosis not present

## 2013-12-19 DIAGNOSIS — S42351A Displaced comminuted fracture of shaft of humerus, right arm, initial encounter for closed fracture: Secondary | ICD-10-CM | POA: Diagnosis not present

## 2013-12-21 DIAGNOSIS — M6281 Muscle weakness (generalized): Secondary | ICD-10-CM | POA: Diagnosis not present

## 2013-12-21 DIAGNOSIS — S42351A Displaced comminuted fracture of shaft of humerus, right arm, initial encounter for closed fracture: Secondary | ICD-10-CM | POA: Diagnosis not present

## 2013-12-22 DIAGNOSIS — S42351A Displaced comminuted fracture of shaft of humerus, right arm, initial encounter for closed fracture: Secondary | ICD-10-CM | POA: Diagnosis not present

## 2013-12-22 DIAGNOSIS — M6281 Muscle weakness (generalized): Secondary | ICD-10-CM | POA: Diagnosis not present

## 2013-12-26 DIAGNOSIS — S42351A Displaced comminuted fracture of shaft of humerus, right arm, initial encounter for closed fracture: Secondary | ICD-10-CM | POA: Diagnosis not present

## 2013-12-26 DIAGNOSIS — M6281 Muscle weakness (generalized): Secondary | ICD-10-CM | POA: Diagnosis not present

## 2013-12-27 DIAGNOSIS — S42291S Other displaced fracture of upper end of right humerus, sequela: Secondary | ICD-10-CM | POA: Diagnosis not present

## 2013-12-28 DIAGNOSIS — S42351A Displaced comminuted fracture of shaft of humerus, right arm, initial encounter for closed fracture: Secondary | ICD-10-CM | POA: Diagnosis not present

## 2013-12-28 DIAGNOSIS — M6281 Muscle weakness (generalized): Secondary | ICD-10-CM | POA: Diagnosis not present

## 2014-01-05 ENCOUNTER — Other Ambulatory Visit: Payer: Self-pay | Admitting: Cardiovascular Disease

## 2014-01-24 ENCOUNTER — Telehealth: Payer: Self-pay | Admitting: Cardiovascular Disease

## 2014-01-24 NOTE — Telephone Encounter (Signed)
Form received from insurance company, filled out and left for Christen Bame, RN to have Dr. Acie Fredrickson sign it tomorrow and fax back to [facility].

## 2014-01-24 NOTE — Telephone Encounter (Signed)
New message     Regarding fax sent regarding bystolic 5mg ---prior approval.  Please call

## 2014-01-27 ENCOUNTER — Telehealth: Payer: Self-pay | Admitting: Cardiovascular Disease

## 2014-01-27 NOTE — Telephone Encounter (Signed)
New Msg       Latrice of [employer], [phone],  Wants to inform medication Bystolic has been approved for one year and pt has been notified.

## 2014-01-27 NOTE — Telephone Encounter (Signed)
[employer] called and stated that they never received anything regarding patients prior authorization for bystolic. She stated that this is urgent that it be faxed today to [phone] of the case may be closed. Thanks, MI

## 2014-01-27 NOTE — Telephone Encounter (Signed)
PA for Bystolic faxed to [phone] and confirmation received

## 2014-01-27 NOTE — Telephone Encounter (Signed)
Noted that Bystolic has been approved for one year and pt has been notified.

## 2014-02-01 ENCOUNTER — Other Ambulatory Visit: Payer: Self-pay | Admitting: Cardiovascular Disease

## 2014-02-02 ENCOUNTER — Other Ambulatory Visit: Payer: Medicare Other

## 2014-02-02 ENCOUNTER — Ambulatory Visit (INDEPENDENT_AMBULATORY_CARE_PROVIDER_SITE_OTHER): Payer: Medicare Other | Admitting: Cardiovascular Disease

## 2014-02-02 ENCOUNTER — Encounter: Payer: Self-pay | Admitting: Cardiovascular Disease

## 2014-02-02 VITALS — BP 110/66 | HR 84 | Ht 64.0 in | Wt 150.1 lb

## 2014-02-02 DIAGNOSIS — I5032 Chronic diastolic (congestive) heart failure: Secondary | ICD-10-CM | POA: Diagnosis not present

## 2014-02-02 DIAGNOSIS — I10 Essential (primary) hypertension: Secondary | ICD-10-CM | POA: Diagnosis not present

## 2014-02-02 DIAGNOSIS — E785 Hyperlipidemia, unspecified: Secondary | ICD-10-CM

## 2014-02-02 DIAGNOSIS — E039 Hypothyroidism, unspecified: Secondary | ICD-10-CM | POA: Diagnosis not present

## 2014-02-02 NOTE — Patient Instructions (Signed)
Your physician recommends that you continue on your current medications as directed. Please refer to the Current Medication list given to you today.  Your physician wants you to follow-up in: 6 months with Dr. [name].  You will receive a reminder letter in the mail two months in advance. If you don't receive a letter, please call our office to schedule the follow-up appointment.  

## 2014-02-02 NOTE — Addendum Note (Signed)
Addended by: Eulis Foster on: 02/02/2014 09:43 AM   Modules accepted: Orders

## 2014-02-02 NOTE — Progress Notes (Signed)
Cardiology Office Note   Date:  02/02/2014   ID:  [name], [name] [birth year], MRN [id number]  PCP:  [name], MD  Cardiologist:   Thayer Headings, MD   Chief Complaint  Patient presents with  . Follow-up    chronic diastolic dysfunction      History of Present Illness: [name] is a 81 y.o. female who presents for   1. Hypertension 2. Hyperlipidemia 3. Hypothyroidism  History of Present Illness:  [name] is an 81 year old female with a history of diastolic dysfunction, mild aortic stenosis and hypothyroidism. She also has a history of tachybradycardia syndrome. She's had a stress Myoview study in May of 2010 which was normal. She has an ejection fraction of 75%. There was no ischemia. She had an echocardiogram performed in September, 2001 which revealed normal left ventricular systolic function with moderate left ventricular hypertrophy and mild aortic stenosis.  She presents today for followup visit. She does not have any complaints. She denies any chest pain or shortness of breath.   Sept. 12, 2014:  [name] is doing well now. She has had some dizziness and had several episodes of near syncope. Both episodes occurred while she was sitting. She was diagnosed with vertigo and was given meclizine. 24 hour Holter revealed NSR and 1 PVC.  We have an echocardiogram from October, 2013 which revealed normal left ventricular systolic function. She has mild aortic stenosis.  March 23, 2013:  She was last seen in Sept. 2014. Her BP has been a bit more variable. Her blood pressure varies between 150s to 120s. She does not eat any extra salt but she eats at the regular food bar at [facility] at [facility] - her husband eats at the heart healthy food bar.   Sept. 8, 2015:  She is being more careful with her salt intake. She is feeling well. She has some DOE.  She was admitted to the hospital last month with some shortness breath.  Her troponin levels were negative. Echocardiogram revealed mild aortic stenosis.  Left ventricle: The cavity size was normal. Wall thickness was increased in a pattern of mild LVH. Systolic function was normal. The estimated ejection fraction was in the range of 55% to 60%. Doppler parameters are consistent with abnormal left ventricular relaxation (grade 1 diastolic dysfunction). - Aortic valve: Moderately calcified annulus. There was mild stenosis. There was trivial regurgitation. Valve area (VTI): 1.32 cm^2. Valve area (Vmax): 1.46 cm^2. Valve area (Vmean): 1.34 cm^2. - Mitral valve: Calcified annulus. - Left atrium: The atrium was mildly dilated. - Atrial septum: No defect or patent foramen ovale was identified  Oct. 7, 2015:  Spell was recently hospitalized for diastolic dysfunction. She had Lasix added to her medical regimen. She was also found to be mildly hypothyroid and her Synthroid dose was increased. She's feeling better at this point. He apparently had some wheezing during her hospitalization and was prescribed a nebulizer. She has not been using the nebulizer and feels well.  Jan. 21, 2016:  [name] is seen back today for her diastolic dysfunction.  Breathing is ok.  Was in the Oct. For a right arm fracture ,  She had surgery.    Still very sore.      Past Medical History  Diagnosis Date  . Hypertension   . Hyperlipidemia   . Tachycardia     history of tachycardia and bradycardia  . Hypothyroidism   . Dyspnea   . Diastolic dysfunction  mild   . Mild mitral regurgitation by prior echocardiogram   . Bradycardia     hospitalized for   . Arthritis   . Dysrhythmia     irregular heartrate  . Anxiety   . Kidney stones   . Aortic stenosis     mild as of echo 09/03/2013    Past Surgical History  Procedure Laterality Date  . Total abdominal hysterectomy    . Cholecystectomy    . Extracorporeal shock wave lithotripsy    . Tonsillectomy    . Eye surgery  Bilateral     cataract surgery with lens implant  . Appendectomy    . Orif humerus fracture Right 10/31/2013    Procedure: Open Reduction Internal Fixation Right Proximal Humerus Fracture;  Surgeon: Marybelle Killings, MD;  Location: [facility];  Service: Orthopedics;  Laterality: Right;     Current Outpatient Prescriptions  Medication Sig Dispense Refill  . acetaminophen (TYLENOL) 325 MG tablet Take 650 mg by mouth 2 (two) times daily. Take 2 tablet in the AM and 2 tablet in PM.    . ALPRAZolam (XANAX) 0.25 MG tablet Take 0.125 mg by mouth daily as needed for anxiety.     [name] amLODipine (NORVASC) 5 MG tablet Take 5 mg by mouth daily.    [name] amLODipine (NORVASC) 5 MG tablet TAKE 1 BY MOUTH DAILY (NEED TO SCHEDULE AN APPOINTMENT FOR FUTURE REFILLS) 90 tablet 0  . furosemide (LASIX) 20 MG tablet TAKE 1 TABLET BY MOUTH EVERY DAY AS NEEDED FOR FLUID 30 tablet 6  . levothyroxine (SYNTHROID, LEVOTHROID) 50 MCG tablet Take 50 mcg by mouth daily.  0  . Multiple Vitamin (MULTIVITAMIN WITH MINERALS) TABS tablet Take 1 tablet by mouth daily.    . Multiple Vitamins-Minerals (PRESERVISION AREDS) CAPS Take 1 capsule by mouth 2 (two) times daily.    . nebivolol (BYSTOLIC) 10 MG tablet Take 1 tablet (10 mg total) by mouth daily. 30 tablet 1  . pravastatin (PRAVACHOL) 20 MG tablet Take 20 mg by mouth every evening.     . sertraline (ZOLOFT) 25 MG tablet Take 25 mg by mouth every morning.     . [DISCONTINUED] Calcium Carb-Cholecalciferol (CALCIUM 1000 + D PO) Take 1,000 mg by mouth daily.      . [DISCONTINUED] escitalopram (LEXAPRO) 10 MG tablet Take 10 mg by mouth daily.       No current facility-administered medications for this visit.    Allergies:   Review of patient's allergies indicates no known allergies.    Social History:  The patient  reports that she has never smoked. She has never used smokeless tobacco. She reports that she does not drink alcohol or use illicit drugs.   Family History:  The patient's  family history includes Cancer in her brother; Emphysema in her father; Heart attack in her brother; Heart failure in her mother; Stroke in her brother.    ROS:  Please see the history of present illness.   Otherwise, review of systems are positive for none.   All other systems are reviewed and negative.    PHYSICAL EXAM: VS:  BP 110/66 mmHg  Pulse 84  Ht 5\' 4"  (1.626 m)  Wt 150 lb 1.9 oz (68.094 kg)  BMI 25.76 kg/m2  SpO2 93% , BMI Body mass index is 25.76 kg/(m^2). GEN: Well nourished, well developed, in no acute distress HEENT: normal Neck: no JVD, moderate left carotid bruit, or masses Cardiac: RRR;  Frequent PVCs. 2/6 systolic  murmurs, rubs,  or gallops, no edema  Respiratory:  clear to auscultation bilaterally, normal work of breathing GI: soft, nontender, nondistended, + BS Diane: no deformity or atrophy Skin: warm and dry, no rash Neuro:  Strength and sensation are intact Psych: euthymic mood, full affect   EKG:  EKG is not ordered today. The ekg ordered today demonstrates ECG from the hospital reveal NSR with PVCs    Recent Labs: 09/25/2013: TSH 8.170* 10/19/2013: Pro B Natriuretic peptide (BNP) 27.0 10/31/2013: ALT 41*; BUN 12; Creatinine 0.59; Hemoglobin 11.2*; Platelets 337; Potassium 4.5; Sodium 138    Lipid Panel    Component Value Date/Time   CHOL 181 10/29/2011 0829   TRIG 118 10/29/2011 0829   HDL 55 10/29/2011 0829   CHOLHDL 3.3 10/29/2011 0829   VLDL 24 10/29/2011 0829   LDLCALC 102* 10/29/2011 0829      Wt Readings from Last 3 Encounters:  02/02/14 150 lb 1.9 oz (68.094 kg)  10/31/13 152 lb (68.947 kg)  10/19/13 152 lb (68.947 kg)      Other studies Reviewed: Additional studies/ records that were reviewed today include: records and labs from medical . Review of the above records demonstrates: normal creatinine and potassium    ASSESSMENT AND PLAN:  1. Hypertension- her blood pressure is well-controlled. 2. Hyperlipidemia - Gen. labs drawn  by her medical doctor.  Look fine. 3. Hypothyroidism- followed by her primary medical doctor. 4. Chronic diastolic dysfunction.  - Her chronic diastolic CHF seems to be well-controlled. Continue current dose of Lasix. Her renal function and potassium are within normal limits.  I'll see her again in 6 months for follow-up visit.    Current medicines are reviewed at length with the patient today.  The patient does not have concerns regarding medicines.  The following changes have been made:  no change  Labs/ tests ordered today include:  No orders of the defined types were placed in this encounter.     Disposition:   FU with me  in 6 months   Signed, Jannat Rosemeyer, Wonda Cheng, MD  02/02/2014 9:57 AM    [facility] [place] [address], [place], [place]  [address] Phone: [phone]; Fax: [phone]

## 2014-02-06 ENCOUNTER — Other Ambulatory Visit: Payer: Self-pay | Admitting: *Deleted

## 2014-02-06 MED ORDER — NEBIVOLOL HCL 10 MG PO TABS: 10.0000 mg | ORAL_TABLET | Freq: Every day | ORAL | Status: DC

## 2014-02-21 DIAGNOSIS — N200 Calculus of kidney: Secondary | ICD-10-CM | POA: Diagnosis not present

## 2014-03-03 ENCOUNTER — Other Ambulatory Visit: Payer: Self-pay | Admitting: Cardiovascular Disease

## 2014-04-20 ENCOUNTER — Other Ambulatory Visit: Payer: Self-pay | Admitting: Cardiovascular Disease

## 2014-04-20 NOTE — Telephone Encounter (Signed)
Thayer Headings, MD at 02/02/2014 9:57 AM  amLODipine (NORVASC) 5 MG tabletTAKE 1 BY MOUTH DAILY (NEED TO SCHEDULE AN APPOINTMENT FOR FUTURE REFILLS) Current medicines are reviewed at length with the patient today. The patient does not have concerns regarding medicines.  The following changes have been made: no change

## 2014-05-15 DIAGNOSIS — E039 Hypothyroidism, unspecified: Secondary | ICD-10-CM | POA: Diagnosis not present

## 2014-05-15 DIAGNOSIS — R42 Dizziness and giddiness: Secondary | ICD-10-CM | POA: Diagnosis not present

## 2014-05-15 DIAGNOSIS — I951 Orthostatic hypotension: Secondary | ICD-10-CM | POA: Diagnosis not present

## 2014-06-13 ENCOUNTER — Other Ambulatory Visit: Payer: Self-pay | Admitting: Cardiovascular Disease

## 2014-06-22 DIAGNOSIS — Z85828 Personal history of other malignant neoplasm of skin: Secondary | ICD-10-CM | POA: Diagnosis not present

## 2014-06-22 DIAGNOSIS — D1800 Hemangioma unspecified site: Secondary | ICD-10-CM | POA: Diagnosis not present

## 2014-06-22 DIAGNOSIS — L814 Other melanin hyperpigmentation: Secondary | ICD-10-CM | POA: Diagnosis not present

## 2014-06-22 DIAGNOSIS — L820 Inflamed seborrheic keratosis: Secondary | ICD-10-CM | POA: Diagnosis not present

## 2014-06-22 DIAGNOSIS — L821 Other seborrheic keratosis: Secondary | ICD-10-CM | POA: Diagnosis not present

## 2014-06-22 DIAGNOSIS — L853 Xerosis cutis: Secondary | ICD-10-CM | POA: Diagnosis not present

## 2014-06-22 DIAGNOSIS — L570 Actinic keratosis: Secondary | ICD-10-CM | POA: Diagnosis not present

## 2014-09-11 ENCOUNTER — Other Ambulatory Visit: Payer: Self-pay | Admitting: Cardiovascular Disease

## 2014-10-09 DIAGNOSIS — D2311 Other benign neoplasm of skin of right eyelid, including canthus: Secondary | ICD-10-CM | POA: Diagnosis not present

## 2014-10-09 DIAGNOSIS — H5203 Hypermetropia, bilateral: Secondary | ICD-10-CM | POA: Diagnosis not present

## 2014-10-09 DIAGNOSIS — H3531 Nonexudative age-related macular degeneration: Secondary | ICD-10-CM | POA: Diagnosis not present

## 2014-10-09 DIAGNOSIS — D2312 Other benign neoplasm of skin of left eyelid, including canthus: Secondary | ICD-10-CM | POA: Diagnosis not present

## 2014-10-18 DIAGNOSIS — Z23 Encounter for immunization: Secondary | ICD-10-CM | POA: Diagnosis not present

## 2014-12-27 ENCOUNTER — Other Ambulatory Visit: Payer: Self-pay | Admitting: Cardiovascular Disease

## 2015-01-29 ENCOUNTER — Other Ambulatory Visit: Payer: Self-pay | Admitting: Cardiovascular Disease

## 2015-02-23 ENCOUNTER — Other Ambulatory Visit: Payer: Self-pay | Admitting: Cardiovascular Disease

## 2015-02-23 MED ORDER — AMLODIPINE BESYLATE 5 MG PO TABS: 5.0000 mg | ORAL_TABLET | Freq: Every day | ORAL | Status: DC

## 2015-03-02 ENCOUNTER — Telehealth: Payer: Self-pay

## 2015-03-02 NOTE — Telephone Encounter (Signed)
Prior auth for Bystolic 10mg  sent to [facility].

## 2015-03-21 DIAGNOSIS — E039 Hypothyroidism, unspecified: Secondary | ICD-10-CM | POA: Diagnosis not present

## 2015-03-21 DIAGNOSIS — F419 Anxiety disorder, unspecified: Secondary | ICD-10-CM | POA: Diagnosis not present

## 2015-03-21 DIAGNOSIS — R0602 Shortness of breath: Secondary | ICD-10-CM | POA: Diagnosis not present

## 2015-03-21 DIAGNOSIS — E7800 Pure hypercholesterolemia, unspecified: Secondary | ICD-10-CM | POA: Diagnosis not present

## 2015-03-21 DIAGNOSIS — I10 Essential (primary) hypertension: Secondary | ICD-10-CM | POA: Diagnosis not present

## 2015-03-30 ENCOUNTER — Other Ambulatory Visit: Payer: Self-pay

## 2015-03-30 MED ORDER — AMLODIPINE BESYLATE 5 MG PO TABS: 5.0000 mg | ORAL_TABLET | Freq: Every day | ORAL | Status: DC

## 2015-04-05 ENCOUNTER — Other Ambulatory Visit: Payer: Self-pay | Admitting: *Deleted

## 2015-04-05 MED ORDER — NEBIVOLOL HCL 10 MG PO TABS: 10.0000 mg | ORAL_TABLET | Freq: Every day | ORAL | Status: DC

## 2015-05-31 DIAGNOSIS — N200 Calculus of kidney: Secondary | ICD-10-CM | POA: Diagnosis not present

## 2015-05-31 DIAGNOSIS — Z0000 Encounter for general adult medical examination without abnormal findings: Secondary | ICD-10-CM | POA: Diagnosis not present

## 2015-06-13 ENCOUNTER — Other Ambulatory Visit: Payer: Self-pay | Admitting: *Deleted

## 2015-06-13 MED ORDER — NEBIVOLOL HCL 10 MG PO TABS: 10.0000 mg | ORAL_TABLET | Freq: Every day | ORAL | Status: DC

## 2015-06-13 NOTE — Telephone Encounter (Signed)
nebivolol (BYSTOLIC) 10 MG tablet  Medication   Date: 04/05/2015  Department: [facility] [place] Office  Ordering/Authorizing: Thayer Headings, MD      Order Providers    Prescribing Provider Encounter Provider   Thayer Headings, MD Sandford Craze, CMA    Medication Detail      Disp Refills Start End     nebivolol (BYSTOLIC) 10 MG tablet 30 tablet 1 04/05/2015     Sig - Route: Take 1 tablet (10 mg total) by mouth daily. - Oral    Notes to Pharmacy: Pt needs to call office [phone]) to make a appointment for any future refills    E-Prescribing Status: Receipt confirmed by pharmacy (04/05/2015 10:13 AM EDT)     Pharmacy    [place], [place]   Will send 2nd attempt

## 2015-07-25 ENCOUNTER — Ambulatory Visit: Payer: Medicare Other | Admitting: Cardiovascular Disease

## 2015-08-01 ENCOUNTER — Ambulatory Visit (INDEPENDENT_AMBULATORY_CARE_PROVIDER_SITE_OTHER): Payer: Medicare Other | Admitting: Cardiovascular Disease

## 2015-08-01 ENCOUNTER — Encounter: Payer: Self-pay | Admitting: Cardiovascular Disease

## 2015-08-01 VITALS — BP 136/60 | HR 71 | Ht 64.0 in | Wt 152.0 lb

## 2015-08-01 DIAGNOSIS — I5032 Chronic diastolic (congestive) heart failure: Secondary | ICD-10-CM

## 2015-08-01 NOTE — Patient Instructions (Signed)
Medication Instructions:  Your physician recommends that you continue on your current medications as directed. Please refer to the Current Medication list given to you today.   Labwork: None Ordered   Testing/Procedures: None Ordered   Follow-Up: Your physician wants you to follow-up in: 6 months with Dr. [name].  You will receive a reminder letter in the mail two months in advance. If you don't receive a letter, please call our office to schedule the follow-up appointment.   If you need a refill on your cardiac medications before your next appointment, please call your pharmacy.   Thank you for choosing [facility]! Edward Trevino, RN [phone]    

## 2015-08-01 NOTE — Progress Notes (Signed)
Cardiology Office Note   Date:  08/01/2015   ID:  GRISELL [name], DOB 1921-06-08, MRN [id number]  PCP:  [name], MD  Cardiologist:   Mertie Moores, MD   Chief Complaint  Patient presents with  . Follow-up      History of Present Illness: [name] is a 82 y.o. female who presents for   1. Hypertension 2. Hyperlipidemia 3. Hypothyroidism  History of Present Illness:  [name] is an 82 year old female with a history of diastolic dysfunction, mild aortic stenosis and hypothyroidism. She also has a history of tachybradycardia syndrome. She's had a stress Myoview study in May of 2010 which was normal. She has an ejection fraction of 75%. There was no ischemia. She had an echocardiogram performed in September, 2001 which revealed normal left ventricular systolic function with moderate left ventricular hypertrophy and mild aortic stenosis.  She presents today for followup visit. She does not have any complaints. She denies any chest pain or shortness of breath.   Sept. 12, 2014:  [name] is doing well now. She has had some dizziness and had several episodes of near syncope. Both episodes occurred while she was sitting. She was diagnosed with vertigo and was given meclizine. 24 hour Holter revealed NSR and 1 PVC.  We have an echocardiogram from October, 2013 which revealed normal left ventricular systolic function. She has mild aortic stenosis.  March 23, 2013:  She was last seen in Sept. 2014. Her BP has been a bit more variable. Her blood pressure varies between 150s to 120s. She does not eat any extra salt but she eats at the regular food bar at [facility] - her husband eats at the heart healthy food bar.   Sept. 8, 2015:  She is being more careful with her salt intake. She is feeling well. She has some DOE.  She was admitted to the hospital last month with some shortness breath. Her troponin levels were negative.  Echocardiogram revealed mild aortic stenosis.  Left ventricle: The cavity size was normal. Wall thickness was increased in a pattern of mild LVH. Systolic function was normal. The estimated ejection fraction was in the range of 55% to 60%. Doppler parameters are consistent with abnormal left ventricular relaxation (grade 1 diastolic dysfunction). - Aortic valve: Moderately calcified annulus. There was mild stenosis. There was trivial regurgitation. Valve area (VTI): 1.32 cm^2. Valve area (Vmax): 1.46 cm^2. Valve area (Vmean): 1.34 cm^2. - Mitral valve: Calcified annulus. - Left atrium: The atrium was mildly dilated. - Atrial septum: No defect or patent foramen ovale was identified  Oct. 7, 2015:  Spell was recently hospitalized for diastolic dysfunction. She had Lasix added to her medical regimen. She was also found to be mildly hypothyroid and her Synthroid dose was increased. She's feeling better at this point. He apparently had some wheezing during her hospitalization and was prescribed a nebulizer. She has not been using the nebulizer and feels well.  Jan. 21, 2016:  [name] is seen back today for her diastolic dysfunction.  Breathing is ok.  Was in the Oct. For a right arm fracture ,  She had surgery.    Still very sore.    August 01, 2015:  [name] is seen back for follow up visit Doing well Still has some DOE with activity.   Does her own shopping ,  Keeps house ( husband helps) Walks with a cane on occasion    Past Medical History  Diagnosis Date  . Hypertension   . Hyperlipidemia   . Tachycardia     history of tachycardia and bradycardia  . Hypothyroidism   . Dyspnea   . Diastolic dysfunction     mild   . Mild mitral regurgitation by prior echocardiogram   . Bradycardia     hospitalized for   . Arthritis   . Dysrhythmia     irregular heartrate  . Anxiety   . Kidney stones   . Aortic stenosis     mild as of echo 09/03/2013    Past Surgical History    Procedure Laterality Date  . Total abdominal hysterectomy    . Cholecystectomy    . Extracorporeal shock wave lithotripsy    . Tonsillectomy    . Eye surgery Bilateral     cataract surgery with lens implant  . Appendectomy    . Orif humerus fracture Right 10/31/2013    Procedure: Open Reduction Internal Fixation Right Proximal Humerus Fracture;  Surgeon: Marybelle Killings, MD;  Location: [facility];  Service: Orthopedics;  Laterality: Right;     Current Outpatient Prescriptions  Medication Sig Dispense Refill  . acetaminophen (TYLENOL) 325 MG tablet Take 650 mg by mouth 2 (two) times daily. Take 2 tablet in the AM and 2 tablet in PM.    . ALPRAZolam (XANAX) 0.25 MG tablet Take 0.125 mg by mouth daily as needed for anxiety.     [name] amLODipine (NORVASC) 5 MG tablet Take 1 tablet (5 mg total) by mouth daily. 30 tablet 0  . furosemide (LASIX) 20 MG tablet Take 1 tablet (20 mg total) by mouth daily as needed for fluid. 14 tablet 0  . levothyroxine (SYNTHROID, LEVOTHROID) 50 MCG tablet Take 50 mcg by mouth daily.  0  . Multiple Vitamin (MULTIVITAMIN WITH MINERALS) TABS tablet Take 1 tablet by mouth daily.    . Multiple Vitamins-Minerals (PRESERVISION AREDS) CAPS Take 1 capsule by mouth 2 (two) times daily.    . nebivolol (BYSTOLIC) 10 MG tablet Take 1 tablet (10 mg total) by mouth daily. 15 tablet 1  . pravastatin (PRAVACHOL) 20 MG tablet Take 20 mg by mouth every evening.     . sertraline (ZOLOFT) 25 MG tablet Take 25 mg by mouth every morning.     . [DISCONTINUED] Calcium Carb-Cholecalciferol (CALCIUM 1000 + D PO) Take 1,000 mg by mouth daily.      . [DISCONTINUED] escitalopram (LEXAPRO) 10 MG tablet Take 10 mg by mouth daily.       No current facility-administered medications for this visit.    Allergies:   Review of patient's allergies indicates no known allergies.    Social History:  The patient  reports that she has never smoked. She has never used smokeless tobacco. She reports that she does  not drink alcohol or use illicit drugs.   Family History:  The patient's family history includes Cancer in her brother; Emphysema in her father; Heart attack in her brother; Heart failure in her mother; Stroke in her brother.    ROS:  Please see the history of present illness.   Otherwise, review of systems are positive for none.   All other systems are reviewed and negative.    PHYSICAL EXAM: VS:  BP 136/60 mmHg  Pulse 71  Ht 5\' 4"  (1.626 m)  Wt 152 lb (68.947 kg)  BMI 26.08 kg/m2 , BMI Body mass index is 26.08 kg/(m^2). GEN: Well nourished, well developed, in no acute distress HEENT: normal Neck: no  JVD, moderate left carotid bruit, or masses Cardiac: RRR;  Frequent PVCs. [id number] systolic  murmurs, rubs, or gallops, no edema  Respiratory:  clear to auscultation bilaterally, normal work of breathing GI: soft, nontender, nondistended, + BS Diane: no deformity or atrophy Skin: warm and dry, no rash Neuro:  Strength and sensation are intact Psych: euthymic mood, full affect   EKG:  EKG is ordered today. The ekg ordered today demonstrates ECG from the hospital reveal NSR with PACs   Recent Labs: No results found for requested labs within last 365 days.    Lipid Panel    Component Value Date/Time   CHOL 181 10/29/2011 0829   TRIG 118 10/29/2011 0829   HDL 55 10/29/2011 0829   CHOLHDL 3.3 10/29/2011 0829   VLDL 24 10/29/2011 0829   LDLCALC 102* 10/29/2011 0829      Wt Readings from Last 3 Encounters:  08/01/15 152 lb (68.947 kg)  02/02/14 150 lb 1.9 oz (68.094 kg)  10/31/13 152 lb (68.947 kg)      Other studies Reviewed: Additional studies/ records that were reviewed today include: records and labs from medical . Review of the above records demonstrates: normal creatinine and potassium    ASSESSMENT AND PLAN:  1. Hypertension- her blood pressure is well-controlled. 2. Hyperlipidemia - Gen. labs drawn by her medical doctor.  Look fine. 3. Hypothyroidism- followed  by her primary medical doctor. 4. Chronic diastolic dysfunction.  - Her chronic diastolic CHF seems to be well-controlled. Continue current dose of Lasix. Her renal function and potassium are within normal limits.  She does have some shortness breath with exertion that seems to be getting along fairly well. I have given her a handicap parking placard.  I'll see her again in 6 months for follow-up visit.    Current medicines are reviewed at length with the patient today.  The patient does not have concerns regarding medicines.  The following changes have been made:  no change  Labs/ tests ordered today include:  No orders of the defined types were placed in this encounter.     Disposition:   FU with me  in 6 months   Signed, Mertie Moores, MD  08/01/2015 3:25 PM    [facility] [place], [place], [place]  [address] Phone: [phone]; Fax: [phone]

## 2015-08-06 ENCOUNTER — Other Ambulatory Visit: Payer: Self-pay | Admitting: *Deleted

## 2015-08-06 MED ORDER — NEBIVOLOL HCL 10 MG PO TABS: 10.0000 mg | ORAL_TABLET | Freq: Every day | ORAL | 0 refills | Status: DC

## 2015-08-06 MED ORDER — NEBIVOLOL HCL 10 MG PO TABS: 10.0000 mg | ORAL_TABLET | Freq: Every day | ORAL | 3 refills | Status: DC

## 2015-08-06 NOTE — Telephone Encounter (Signed)
Called [facility] to refill bystolic 10 mg with a 30 day temp refill until they received the mail RX. Spoke to [place] they received it electronic.

## 2015-08-10 DIAGNOSIS — D225 Melanocytic nevi of trunk: Secondary | ICD-10-CM | POA: Diagnosis not present

## 2015-08-10 DIAGNOSIS — L821 Other seborrheic keratosis: Secondary | ICD-10-CM | POA: Diagnosis not present

## 2015-08-10 DIAGNOSIS — D1800 Hemangioma unspecified site: Secondary | ICD-10-CM | POA: Diagnosis not present

## 2015-08-10 DIAGNOSIS — L309 Dermatitis, unspecified: Secondary | ICD-10-CM | POA: Diagnosis not present

## 2015-08-10 DIAGNOSIS — L814 Other melanin hyperpigmentation: Secondary | ICD-10-CM | POA: Diagnosis not present

## 2015-08-10 DIAGNOSIS — Z85828 Personal history of other malignant neoplasm of skin: Secondary | ICD-10-CM | POA: Diagnosis not present

## 2015-08-10 DIAGNOSIS — L570 Actinic keratosis: Secondary | ICD-10-CM | POA: Diagnosis not present

## 2015-09-01 ENCOUNTER — Other Ambulatory Visit: Payer: Self-pay | Admitting: Cardiovascular Disease

## 2015-09-03 ENCOUNTER — Other Ambulatory Visit: Payer: Self-pay | Admitting: Cardiovascular Disease

## 2015-09-27 ENCOUNTER — Telehealth: Payer: Self-pay | Admitting: Cardiology

## 2015-09-27 ENCOUNTER — Emergency Department (HOSPITAL_COMMUNITY)
Admission: EM | Admit: 2015-09-27 | Discharge: 2015-09-27 | Disposition: A | Discharge status: Home / Self Care | Payer: Medicare Other | Attending: Emergency Medicine | Admitting: Emergency Medicine

## 2015-09-27 ENCOUNTER — Encounter (HOSPITAL_COMMUNITY): Payer: Self-pay

## 2015-09-27 ENCOUNTER — Encounter: Payer: Self-pay | Admitting: Internal Medicine

## 2015-09-27 ENCOUNTER — Emergency Department (HOSPITAL_COMMUNITY): Payer: Medicare Other

## 2015-09-27 DIAGNOSIS — I493 Ventricular premature depolarization: Secondary | ICD-10-CM | POA: Insufficient documentation

## 2015-09-27 DIAGNOSIS — R531 Weakness: Secondary | ICD-10-CM | POA: Insufficient documentation

## 2015-09-27 DIAGNOSIS — I499 Cardiac arrhythmia, unspecified: Secondary | ICD-10-CM | POA: Diagnosis not present

## 2015-09-27 DIAGNOSIS — Z79899 Other long term (current) drug therapy: Secondary | ICD-10-CM | POA: Insufficient documentation

## 2015-09-27 DIAGNOSIS — I5033 Acute on chronic diastolic (congestive) heart failure: Secondary | ICD-10-CM | POA: Diagnosis not present

## 2015-09-27 DIAGNOSIS — E039 Hypothyroidism, unspecified: Secondary | ICD-10-CM | POA: Diagnosis not present

## 2015-09-27 DIAGNOSIS — I110 Hypertensive heart disease with heart failure: Secondary | ICD-10-CM | POA: Diagnosis not present

## 2015-09-27 DIAGNOSIS — R001 Bradycardia, unspecified: Secondary | ICD-10-CM | POA: Diagnosis not present

## 2015-09-27 DIAGNOSIS — N390 Urinary tract infection, site not specified: Secondary | ICD-10-CM | POA: Diagnosis not present

## 2015-09-27 DIAGNOSIS — R0602 Shortness of breath: Secondary | ICD-10-CM | POA: Diagnosis not present

## 2015-09-27 LAB — BASIC METABOLIC PANEL
ANION GAP: 7 (ref 5–15)
BUN: 13 mg/dL (ref 6–20)
CALCIUM: 9.6 mg/dL (ref 8.9–10.3)
CO2: 24 mmol/L (ref 22–32)
CREATININE: 0.78 mg/dL (ref 0.44–1.00)
Chloride: 108 mmol/L (ref 101–111)
GFR calc non Af Amer: >60 mL/min (ref >60)
Glucose, Bld: 100 mg/dL — ABNORMAL HIGH (ref 65–99)
Potassium: 3.9 mmol/L (ref 3.5–5.1)
SODIUM: 139 mmol/L (ref 135–145)

## 2015-09-27 LAB — URINALYSIS, ROUTINE W REFLEX MICROSCOPIC
BILIRUBIN URINE: NEGATIVE
GLUCOSE, UA: NEGATIVE mg/dL
Hgb urine dipstick: NEGATIVE
KETONES UR: NEGATIVE mg/dL
Nitrite: NEGATIVE
PH: 7 (ref 5.0–8.0)
Protein, ur: NEGATIVE mg/dL
Specific Gravity, Urine: 1.011 (ref 1.005–1.030)

## 2015-09-27 LAB — I-STAT TROPONIN, ED
TROPONIN I, POC: 0 ng/mL (ref 0.00–0.08)
Troponin i, poc: 0 ng/mL (ref 0.00–0.08)

## 2015-09-27 LAB — CBC
HCT: 39.8 % (ref 36.0–46.0)
HEMOGLOBIN: 12.9 g/dL (ref 12.0–15.0)
MCH: 30.8 pg (ref 26.0–34.0)
MCHC: 32.4 g/dL (ref 30.0–36.0)
MCV: 95 fL (ref 78.0–100.0)
PLATELETS: 318 10*3/uL (ref 150–400)
RBC: 4.19 MIL/uL (ref 3.87–5.11)
RDW: 14.5 % (ref 11.5–15.5)
WBC: 9.6 10*3/uL (ref 4.0–10.5)

## 2015-09-27 LAB — TSH: TSH: 3.092 u[IU]/mL (ref 0.350–4.500)

## 2015-09-27 LAB — URINE MICROSCOPIC-ADD ON

## 2015-09-27 LAB — T4, FREE: FREE T4: 0.89 ng/dL (ref 0.61–1.12)

## 2015-09-27 MED ORDER — CEPHALEXIN 500 MG PO CAPS: 500.0000 mg | ORAL_CAPSULE | Freq: Three times a day (TID) | ORAL | 0 refills | Status: AC

## 2015-09-27 NOTE — Telephone Encounter (Signed)
Patient in ER due to feeling tired.  HR taken at home in 40's.  In ER labs normal and EKG showed NSR with occasional PVC's with HR in the 80's.  Suspect that PVC's were not being felt with pulse and so caregiver thought HR was only 40. Patient wants to go home.  BNP and trop are normal.  OK to d/c home.  Will get OV for tomorrow with extender.  Probably needs 24 Holter to assess PVC load and average heart rate.  ER will draw TSH prior to d/c.  Please set up OV with extender or Dr. Acie Fredrickson in the office tomorrow.

## 2015-09-27 NOTE — ED Triage Notes (Signed)
Per EMS, pt from assisted living, pt has been taking BP at home and HR has been reading 40 bradycardic. Pt went to Dr's office today and pt was having bigeminal pvc's with occasional trigeminy and sinus brady. BP 154/67, HR staying in mid 50s sinus brady. Pt has no complaints other than intermittent dizziness x 1 year. Spo2 96% on RA, RR 22. Pt alert and oriented x4.

## 2015-09-27 NOTE — ED Notes (Signed)
Pt transported to xray 

## 2015-09-27 NOTE — ED Provider Notes (Signed)
[facility] DEPT Provider Note   CSN: [id number] Arrival date & time: 09/27/15  1648     History   Chief Complaint Chief Complaint  Patient presents with  . Bradycardia    HPI [name] is a 82 y.o. female.  HPI Lack of energy, lack of strength to get up and do things, lack of energy for last 2-3 days, dizziness off and on for a long time  No chest pain  Shortness of breath with minimal exertion now, even from bathroom   HR 40s at home Saw PCP today and was sent over for concern for bigeminy and ?bradycardia   Past Medical History:  Diagnosis Date  . Anxiety   . Aortic stenosis    mild as of echo 09/03/2013  . Arthritis   . Bradycardia    hospitalized for   . Diastolic dysfunction    mild   . Dyspnea   . Dysrhythmia    irregular heartrate  . Hyperlipidemia   . Hypertension   . Hypothyroidism   . Kidney stones   . Mild mitral regurgitation by prior echocardiogram   . Tachycardia    history of tachycardia and bradycardia    Patient Active Problem List   Diagnosis Date Noted  . Hyperlipidemia 02/02/2014  . Fracture of humerus, proximal, right, closed 10/31/2013  . Proximal humerus fracture 10/31/2013  . UTI (urinary tract infection) 09/24/2013  . Acute on chronic diastolic congestive heart failure ([place]) 09/24/2013  . CHF (congestive heart failure) ([place]) 09/24/2013  . Abnormal EKG 09/24/2013  . Acute on chronic diastolic heart failure ([facility]) 09/24/2013  . Infection of left eye 09/24/2013  . Near syncope 09/24/2012  . Hypothyroidism 10/30/2011  . Chest pain 10/29/2011  . HTN (hypertension) 12/20/2010  . Chronic diastolic CHF (congestive heart failure) ([place]) 07/05/2010  . Nonspecific (abnormal) findings on radiological and other examination of body structure 11/12/2009  . CT, CHEST, ABNORMAL 11/12/2009  . DYSPNEA ON EXERTION 06/05/2009    Past Surgical History:  Procedure Laterality Date  . APPENDECTOMY    . CHOLECYSTECTOMY    . EXTRACORPOREAL  SHOCK WAVE LITHOTRIPSY    . EYE SURGERY Bilateral    cataract surgery with lens implant  . ORIF HUMERUS FRACTURE Right 10/31/2013   Procedure: Open Reduction Internal Fixation Right Proximal Humerus Fracture;  Surgeon: Marybelle Killings, MD;  Location: [facility];  Service: Orthopedics;  Laterality: Right;  . TONSILLECTOMY    . TOTAL ABDOMINAL HYSTERECTOMY      OB History    No data available       Home Medications    Prior to Admission medications   Medication Sig Start Date End Date Taking? Authorizing Provider  acetaminophen (TYLENOL) 325 MG tablet Take 325 mg by mouth 2 (two) times daily as needed for mild pain.    Yes Historical Provider, MD  ALPRAZolam Duanne Moron) 0.25 MG tablet Take 0.125 mg by mouth daily as needed for anxiety.    Yes Historical Provider, MD  amLODipine (NORVASC) 5 MG tablet Take 1 tablet (5 mg total) by mouth daily. 03/30/15  Yes Thayer Headings, MD  cholecalciferol (VITAMIN D) 1000 units tablet Take 1,000 Units by mouth daily.   Yes Historical Provider, MD  furosemide (LASIX) 20 MG tablet Take 1 tablet (20 mg total) by mouth daily as needed for fluid. Patient taking differently: Take 10 mg by mouth daily.  01/30/15  Yes Thayer Headings, MD  levothyroxine (SYNTHROID, LEVOTHROID) 50 MCG tablet Take 50 mcg by  mouth daily. 11/30/13  Yes Historical Provider, MD  Multiple Vitamins-Minerals (PRESERVISION AREDS) CAPS Take 1 capsule by mouth 2 (two) times daily.   Yes Historical Provider, MD  nebivolol (BYSTOLIC) 10 MG tablet Take 1 tablet (10 mg total) by mouth daily. 08/06/15  Yes Thayer Headings, MD  pravastatin (PRAVACHOL) 20 MG tablet Take 20 mg by mouth every evening.    Yes Historical Provider, MD  sertraline (ZOLOFT) 25 MG tablet Take 25 mg by mouth every morning.  08/15/13  Yes Historical Provider, MD  cephALEXin (KEFLEX) 500 MG capsule Take 1 capsule (500 mg total) by mouth 3 (three) times daily. 09/27/15 10/04/15  Gareth Morgan, MD    Family History Family History    Problem Relation Age of Onset  . Emphysema Father   . Heart failure Mother   . Heart attack Brother   . Cancer Brother     kidney  . Stroke Brother     Social History Social History  Substance Use Topics  . Smoking status: Never Smoker  . Smokeless tobacco: Never Used  . Alcohol use No     Allergies   Review of patient's allergies indicates no known allergies.   Review of Systems Review of Systems  Constitutional: Positive for fatigue (2-3 days). Negative for fever.  HENT: Negative for sore throat.   Eyes: Negative for visual disturbance.  Respiratory: Positive for shortness of breath. Negative for cough.   Cardiovascular: Negative for chest pain and leg swelling.  Gastrointestinal: Negative for abdominal pain, nausea and vomiting.  Genitourinary: Negative for difficulty urinating.  Musculoskeletal: Negative for back pain and neck pain.  Skin: Negative for rash.  Neurological: Positive for light-headedness (for past year). Negative for syncope and headaches.     Physical Exam Updated Vital Signs BP 134/83   Pulse (!) 39   Temp 98 F (36.7 C) (Oral)   Resp 14   Ht 5\' 5"  (1.651 m)   Wt 150 lb (68 kg)   SpO2 96%   BMI 24.96 kg/m   Physical Exam  Constitutional: She is oriented to person, place, and time. She appears well-developed and well-nourished. No distress.  HENT:  Head: Normocephalic and atraumatic.  Eyes: Conjunctivae and EOM are normal.  Neck: Normal range of motion.  Cardiovascular: Normal rate, regular rhythm, normal heart sounds and intact distal pulses.  Exam reveals no gallop and no friction rub.   No murmur heard. Pulmonary/Chest: Effort normal and breath sounds normal. No respiratory distress. She has no wheezes. She has no rales.  Abdominal: Soft. She exhibits no distension.  Musculoskeletal: She exhibits no edema or tenderness.  Neurological: She is alert and oriented to person, place, and time.  Skin: Skin is warm and dry. No rash noted.  She is not diaphoretic. No erythema.  Nursing note and vitals reviewed.    ED Treatments / Results  Labs (all labs ordered are listed, but only abnormal results are displayed) Labs Reviewed  BASIC METABOLIC PANEL - Abnormal; Notable for the following:       Result Value   Glucose, Bld 100 (*)    All other components within normal limits  URINALYSIS, ROUTINE W REFLEX MICROSCOPIC (NOT AT [facility]) - Abnormal; Notable for the following:    Leukocytes, UA MODERATE (*)    All other components within normal limits  URINE MICROSCOPIC-ADD ON - Abnormal; Notable for the following:    Squamous Epithelial / LPF 0-5 (*)    Bacteria, UA FEW (*)    All  other components within normal limits  URINE CULTURE  CBC  TSH  T4, FREE  I-STAT TROPOININ, ED  I-STAT TROPOININ, ED    EKG  EKG Interpretation  Date/Time:  Thursday September 27 2015 16:49:21 EDT Ventricular Rate:  86 PR Interval:    QRS Duration: 96 QT Interval:  384 QTC Calculation: 460 R Axis:   71 Text Interpretation:  Sinus rhythm Frequent PVCs Abnormal R-wave progression, early transition No significant change since last tracing Confirmed by [facility] MD, [place] ([id number]) on 09/28/2015 4:03:55 AM       Radiology Dg Chest 2 View  Result Date: 09/27/2015 CLINICAL DATA:  Irregular heartbeat today, shortness of breath off and on for 2 years, hypertension, diastolic dysfunction, hyperlipidemia EXAM: CHEST  2 VIEW COMPARISON:  09/24/2013, 09/02/2013 FINDINGS: Upper normal heart size with slight vascular congestion. Mediastinal contours normal. Aortic atherosclerotic calcification. Bronchitic changes with chronic accentuation of interstitial markings not significantly changed since 09/02/2013. No segmental consolidation, pleural effusion or pneumothorax. Bones demineralized. Orthopedic hardware at proximal LEFT humerus. IMPRESSION: Bronchitic and chronic interstitial changes. No acute abnormalities. Aortic atherosclerosis. Electronically Signed    By: Lavonia Dana M.D.   On: 09/27/2015 17:31    Procedures Procedures (including critical care time)  Medications Ordered in ED Medications - No data to display   Initial Impression / Assessment and Plan / ED Course  I have reviewed the triage vital signs and the nursing notes.  Pertinent labs & imaging results that were available during my care of the patient were reviewed by me and considered in my medical decision making (see chart for details).  Clinical Course   82yo female with history of aortic stenosis, diastolic dysfunction, hypertension, hyperlipidemia presents with concern for generalized weakness for 3 days and was sent by PCP for concern for bradycardia/bigeminy.  Patient with normal HR during observation in ED.  Pt with frequent PVCs. Discussed with Cardiology on call, Dr. Radford Pax who feels that low HR recorded at home are likely secondary to PVCs not being recorded and that outpatient follow up tomorrow in clinic is appropriate.  Obtained labs to screen for other etiologies of days of fatigue.  Delta troponin negative and pt denies CP.  Screening electrolytes/hgb WNL. CXR with chronic changes.  Patient remains hemodynamically stable during observation in the ED.  Urinalysis shows leukocytes and UTI may contribute to fatigue. Given pt tolerating po, hemodynamically stable, no fever, no chest pain, no significant rhythm changes and Cardiology recommendations and pt preference, will discharge and recommend close Cardiology follow up tomorrow and outpatient antibiotics for possible UTI.  Patient discharged in stable condition with understanding of reasons to return.   Final Clinical Impressions(s) / ED Diagnoses   Final diagnoses:  Urinary tract infection without hematuria, site unspecified  Generalized weakness  PVC (premature ventricular contraction)    New Prescriptions Discharge Medication List as of 09/27/2015  9:33 PM    START taking these medications   Details    cephALEXin (KEFLEX) 500 MG capsule Take 1 capsule (500 mg total) by mouth 3 (three) times daily., Starting Thu 09/27/2015, Until Thu 10/04/2015, Print         Gareth Morgan, MD 09/28/15 [phone]

## 2015-09-27 NOTE — ED Notes (Signed)
Pt verbalized understanding of d/c instructions and has no further questions. Pt stable and NAD. Pt to take antibiotics for UTI and to follow up with cardiologist tomorrow.

## 2015-09-27 NOTE — Telephone Encounter (Signed)
This encounter was created in error - please disregard.

## 2015-09-28 ENCOUNTER — Encounter: Payer: Self-pay | Admitting: Physician Assistant

## 2015-09-28 ENCOUNTER — Ambulatory Visit (INDEPENDENT_AMBULATORY_CARE_PROVIDER_SITE_OTHER): Payer: Medicare Other | Admitting: Physician Assistant

## 2015-09-28 VITALS — BP 122/82 | HR 71 | Ht 65.0 in | Wt 150.8 lb

## 2015-09-28 DIAGNOSIS — R0609 Other forms of dyspnea: Secondary | ICD-10-CM | POA: Diagnosis not present

## 2015-09-28 DIAGNOSIS — I493 Ventricular premature depolarization: Secondary | ICD-10-CM

## 2015-09-28 DIAGNOSIS — R5383 Other fatigue: Secondary | ICD-10-CM

## 2015-09-28 DIAGNOSIS — R001 Bradycardia, unspecified: Secondary | ICD-10-CM | POA: Diagnosis not present

## 2015-09-28 DIAGNOSIS — I5032 Chronic diastolic (congestive) heart failure: Secondary | ICD-10-CM

## 2015-09-28 DIAGNOSIS — I119 Hypertensive heart disease without heart failure: Secondary | ICD-10-CM | POA: Insufficient documentation

## 2015-09-28 MED ORDER — ALBUTEROL SULFATE HFA 108 (90 BASE) MCG/ACT IN AERS: INHALATION_SPRAY | RESPIRATORY_TRACT | 1 refills | Status: DC

## 2015-09-28 NOTE — Telephone Encounter (Signed)
Called pt per Dr. Theodosia Blender note.  Gave her an appointment w/Dayna Dunn at 2:00.

## 2015-09-28 NOTE — Patient Instructions (Signed)
Your physician has recommended you make the following change in your medication:  1.) start using Albuterol inhaler--2 puffs every 4-6 hours as needed for shortness of breath  Your physician has requested that you have an echocardiogram. Echocardiography is a painless test that uses sound waves to create images of your heart. It provides your doctor with information about the size and shape of your heart and how well your heart's chambers and valves are working. This procedure takes approximately one hour. There are no restrictions for this procedure.  Your physician has recommended that you wear a holter monitor. Holter monitors are medical devices that record the heart's electrical activity. Doctors most often use these monitors to diagnose arrhythmias. Arrhythmias are problems with the speed or rhythm of the heartbeat. The monitor is a small, portable device. You can wear one while you do your normal daily activities. This is usually used to diagnose what is causing palpitations/syncope (passing out).  Your physician recommends that you schedule a follow-up appointment in: 4 weeks with Melina Copa, PA-C.

## 2015-09-28 NOTE — Progress Notes (Addendum)
Cardiology Office Note    Date:  09/28/2015  ID:  [name], DOB 13-Apr-1921, MRN [id number] PCP:  [name], MD  Cardiologist:  [name]  Chief Complaint: f/u ER visit - fatigue, SOB  History of Present Illness:  [name] is a 82 y.o. female with history of HTN, hyperlipidemia, hypothyroidism, mild AS by echo 123456, chronic diastolic CHF, hypertensive heart disease, and possible prior tachy-brady syndrome (although not clear if PVCs were causing perception of bradycardia). She presents for post-ER follow-up. She has history of near-syncope/dizziness in 2014 - was diagnosed with vertigo. Holter 08/2012: min HR 59, max 100, mean 76, longest pause 1.63 sec, 1651 PVCs in 24 hours (1.5% of total QRS). Event monitor 09/2012: NSR, freq PVCs. Normal nuc 2010. 2D echo 08/2013: mild LVH, EF 55-60%, grade 1 DD, mild AS, calcified mitral annulus, mild LAE. Per review of chart, she was admitted to the [facility] 09/2013 with acute on chronic diastolic CHF, frequent PVCs (contributing to a low effective HR by count), and UTI. She was doing well at last OV 07/2015.  She was seen in the ER last night complaining of lack of energy and lack of strength to do things, and dizziness on/off (for a long time). She reports DOE. She had not had any chest pain. Her caregiver felt her pulse at home and noted it was in the 40s so she went to her PCP at which time she was sent to the ER for concern for ventricular bigeminy and possible bradycardia. The office note from her PCP visit cites a HR of 45 but EKG from same visit showed HR 88. While in the ER, HR was in the 80s, frequent PVCs on telemetry. The EDP spoke with Dr. Radford Pax who felt she may have pseudobradycardia due to the frequent PVCs not being palpable by manual pulse check. The ER also sent her home with abx for UTI. CXR: bronchitis and chronic interstitial changes, no acute abnormalities, aortic atherosclerosis. Troponin neg x2. TSH, free T4. CBC wnl, BMET  with K 3.9, Cr 0.78.  She returns for follow-up today. She continues with exertional dyspnea - has been present for many years but worse over the past few months. Now SOB with even a little bit of activity. No chest pain, LEE, orthopnea, PND, or weight change. No presyncope or syncope.   Past Medical History:  Diagnosis Date  . Anxiety   . Aortic stenosis    mild as of echo 09/03/2013  . Arthritis   . Bradycardia    a. ? details not clear - in the past has had PVCs which have lowered effective HR count by pulse check.  . Chronic diastolic CHF (congestive heart failure) ([place])   . Diastolic dysfunction    mild   . Frequent PVCs   . Hyperlipidemia   . Hypertension   . Hypertensive heart disease   . Hypothyroidism   . Kidney stones   . Mild mitral regurgitation by prior echocardiogram   . Tachycardia    history of tachycardia and bradycardia    Past Surgical History:  Procedure Laterality Date  . APPENDECTOMY    . CHOLECYSTECTOMY    . EXTRACORPOREAL SHOCK WAVE LITHOTRIPSY    . EYE SURGERY Bilateral    cataract surgery with lens implant  . ORIF HUMERUS FRACTURE Right 10/31/2013   Procedure: Open Reduction Internal Fixation Right Proximal Humerus Fracture;  Surgeon: Marybelle Killings, MD;  Location: [facility];  Service: Orthopedics;  Laterality: Right;  .  TONSILLECTOMY    . TOTAL ABDOMINAL HYSTERECTOMY      Current Medications: Current Outpatient Prescriptions  Medication Sig Dispense Refill  . acetaminophen (TYLENOL) 325 MG tablet Take 325 mg by mouth 2 (two) times daily as needed for mild pain.     [name] ALPRAZolam (XANAX) 0.25 MG tablet Take 0.125 mg by mouth daily as needed for anxiety.     [name] amLODipine (NORVASC) 5 MG tablet Take 1 tablet (5 mg total) by mouth daily. 30 tablet 0  . cephALEXin (KEFLEX) 500 MG capsule Take 1 capsule (500 mg total) by mouth 3 (three) times daily. 21 capsule 0  . cholecalciferol (VITAMIN D) 1000 units tablet Take 1,000 Units by mouth daily.    .  furosemide (LASIX) 20 MG tablet Take 10 mg by mouth daily.    [name] levothyroxine (SYNTHROID, LEVOTHROID) 50 MCG tablet Take 50 mcg by mouth daily.  0  . Multiple Vitamins-Minerals (PRESERVISION AREDS) CAPS Take 1 capsule by mouth 2 (two) times daily.    . nebivolol (BYSTOLIC) 10 MG tablet Take 1 tablet (10 mg total) by mouth daily. 90 tablet 3  . pravastatin (PRAVACHOL) 20 MG tablet Take 20 mg by mouth every evening.     . sertraline (ZOLOFT) 25 MG tablet Take 25 mg by mouth every morning.      No current facility-administered medications for this visit.      Allergies:   Review of patient's allergies indicates no known allergies.   Social History   Social History  . Marital status: Married    Spouse name: N/A  . Number of children: N/A  . Years of education: N/A   Social History Main Topics  . Smoking status: Never Smoker  . Smokeless tobacco: Never Used  . Alcohol use No  . Drug use: No  . Sexual activity: Not Currently   Other Topics Concern  . None   Social History Narrative  . None     Family History:  The patient's family history includes Cancer in her brother; Emphysema in her father; Heart attack in her brother; Heart failure in her mother; Stroke in her brother.   ROS:   Please see the history of present illness.  All other systems are reviewed and otherwise negative.    PHYSICAL EXAM:   VS:  BP 122/82   Pulse 71   Ht 5\' 5"  (1.651 m)   Wt 150 lb 12.8 oz (68.4 kg)   SpO2 96%   BMI 25.09 kg/m   BMI: Body mass index is 25.09 kg/m. GEN: Well nourished, well developed WF, in no acute distress  Looks much younger than stated age 82: normocephalic, atraumatic Neck: no JVD, carotid bruits, or masses Cardiac: RRR; no murmurs, rubs, or gallops, no edema  Respiratory:  Diffuse wheezing, normal work of breathing GI: soft, nontender, nondistended, + BS MS: no deformity or atrophy  Skin: warm and dry, no rash Neuro:  Alert and Oriented x 3, Strength and sensation  are intact, follows commands Psych: euthymic mood, full affect  Wt Readings from Last 3 Encounters:  09/28/15 150 lb 12.8 oz (68.4 kg)  09/27/15 150 lb (68 kg)  08/01/15 152 lb (68.9 kg)      Studies/Labs Reviewed:   EKG:  EKG was ordered today and personally reviewed by me and demonstrates NSR 71bpm, frequent PVCs  Recent Labs: 09/27/2015: BUN 13; Creatinine, Ser 0.78; Hemoglobin 12.9; Platelets 318; Potassium 3.9; Sodium 139; TSH 3.092   Lipid Panel    Component  Value Date/Time   CHOL 181 10/29/2011 0829   TRIG 118 10/29/2011 0829   HDL 55 10/29/2011 0829   CHOLHDL 3.3 10/29/2011 0829   VLDL 24 10/29/2011 0829   LDLCALC 102 (H) 10/29/2011 0829    Additional studies/ records that were reviewed today include: Summarized above    ASSESSMENT & PLAN:   1. Fatigue/DOE - unclear if etiology is cardiac in nature. She has diffuse mild wheezing on exam today. Pulse Ox wnl. Does not appear volume overloaded. Will rx a trial of albuterol inhaler to see if it helps with her dyspnea. Will proceed with 48-hour Holter monitor as recommended per Dr. Theodosia Blender note to quantify PVC burden. Will also update echocardiogram - if there have been any changes in LV function may need to consider ischemic testing. She appears quite functional for a 82 year old. I would have thought she was 82 years old based on appearance. 2. Frequent PVCs - as above. It should be noted frequent PVCs have been recorded in the past but her burden was not previously significant. She is maintained on Bystolic for these. 3. Bradycardia - follow with event monitor. This may represent pseudobradycardia in the setting of her frequent PVCs. 4. Chronic diastolic CHF - she does not appear volume overloaded. She has been taking low dose Lasix daily. I told her she can go back to PRN as previously recommended to see if it helps with her chronic dizziness. If she begins to feel more SOB without regular dosing she will contact our  office.  Disposition: F/u with me in 4 weeks.   Medication Adjustments/Labs and Tests Ordered: Current medicines are reviewed at length with the patient today.  Concerns regarding medicines are outlined above. Medication changes, Labs and Tests ordered today are summarized above and listed in the Patient Instructions accessible in Encounters.   Raechel Ache PA-C  09/28/2015 2:39 PM    [facility] [place], [place], [place]  [address] Phone: [phone]; Fax: [phone]

## 2015-09-29 LAB — URINE CULTURE

## 2015-10-02 DIAGNOSIS — N200 Calculus of kidney: Secondary | ICD-10-CM | POA: Diagnosis not present

## 2015-10-03 ENCOUNTER — Encounter: Payer: Self-pay | Admitting: Physician Assistant

## 2015-10-05 ENCOUNTER — Ambulatory Visit (HOSPITAL_COMMUNITY)
Admission: RE | Admit: 2015-10-05 | Discharge: 2015-10-05 | Disposition: A | Discharge status: Home / Self Care | Payer: Medicare Other | Source: Ambulatory Visit | Attending: Physician Assistant | Admitting: Physician Assistant

## 2015-10-05 DIAGNOSIS — I350 Nonrheumatic aortic (valve) stenosis: Secondary | ICD-10-CM | POA: Insufficient documentation

## 2015-10-05 DIAGNOSIS — I700 Atherosclerosis of aorta: Secondary | ICD-10-CM | POA: Diagnosis not present

## 2015-10-05 DIAGNOSIS — R0609 Other forms of dyspnea: Secondary | ICD-10-CM | POA: Insufficient documentation

## 2015-10-05 DIAGNOSIS — I509 Heart failure, unspecified: Secondary | ICD-10-CM | POA: Insufficient documentation

## 2015-10-05 DIAGNOSIS — I371 Nonrheumatic pulmonary valve insufficiency: Secondary | ICD-10-CM | POA: Diagnosis not present

## 2015-10-05 DIAGNOSIS — I340 Nonrheumatic mitral (valve) insufficiency: Secondary | ICD-10-CM | POA: Insufficient documentation

## 2015-10-05 LAB — ECHOCARDIOGRAM COMPLETE
AO mean calculated velocity dopler: 186 cm/s
AOVTI: 61.8 cm
AV Area VTI index: 0.64 cm2/m2
AV Area mean vel: 1.04 cm2
AV area mean vel ind: 0.6 cm2/m2
AV peak Index: 0.62
AV vel: 1.12
AVA: 1.12 cm2
AVAREAVTI: 1.09 cm2
AVCELMEANRAT: 0.33
AVG: 15 mmHg
AVPG: 24 mmHg
AVPKVEL: 247 cm/s
Ao pk vel: 0.35 m/s
CHL CUP AV VALUE AREA INDEX: 0.64
CHL CUP STROKE VOLUME: 43 mL
EERAT: 17.89
EWDT: 239 ms
FS: 23 % — AB (ref 28–44)
IV/PV OW: 0.91
LA vol A4C: 53.2 ml
LADIAMINDEX: 2.29 cm/m2
LASIZE: 40 mm
LAVOL: 54.3 mL
LAVOLIN: 31 mL/m2
LDCA: 3.14 cm2
LEFT ATRIUM END SYS DIAM: 40 mm
LV PW d: 7.92 mm — AB (ref 0.6–1.1)
LV TDI E'LATERAL: 5.98
LV dias vol: 78 mL (ref 46–106)
LV e' LATERAL: 5.98 cm/s
LV sys vol: 35 mL (ref 14–42)
LVDIAVOLIN: 45 mL/m2
LVEEAVG: 17.89
LVEEMED: 17.89
LVOT SV: 69 mL
LVOT VTI: 22 cm
LVOT diameter: 20 mm
LVOT peak VTI: 0.36 cm
LVOTPV: 86.1 cm/s
LVSYSVOLIN: 20 mL/m2
MV Dec: 239
MV pk A vel: 99.9 m/s
MV pk E vel: 107 m/s
MVPG: 5 mmHg
RV LATERAL S' VELOCITY: 10.6 cm/s
RV TAPSE: 19.5 mm
RV sys press: 33 mmHg
Reg peak vel: 272 cm/s
Simpson's disk: 55
TDI e' medial: 7.51
TRMAXVEL: 272 cm/s

## 2015-10-05 NOTE — Progress Notes (Signed)
  Echocardiogram 2D Echocardiogram has been performed.  [name] 10/05/2015, 10:08 AM

## 2015-10-08 ENCOUNTER — Ambulatory Visit (INDEPENDENT_AMBULATORY_CARE_PROVIDER_SITE_OTHER): Payer: Medicare Other

## 2015-10-08 DIAGNOSIS — I493 Ventricular premature depolarization: Secondary | ICD-10-CM | POA: Diagnosis not present

## 2015-10-09 ENCOUNTER — Telehealth: Payer: Self-pay | Admitting: *Deleted

## 2015-10-09 ENCOUNTER — Other Ambulatory Visit: Payer: Self-pay

## 2015-10-09 DIAGNOSIS — I493 Ventricular premature depolarization: Secondary | ICD-10-CM

## 2015-10-09 DIAGNOSIS — I5032 Chronic diastolic (congestive) heart failure: Secondary | ICD-10-CM

## 2015-10-09 MED ORDER — FUROSEMIDE 20 MG PO TABS: 20.0000 mg | ORAL_TABLET | Freq: Every day | ORAL | 3 refills | Status: DC

## 2015-10-09 NOTE — Telephone Encounter (Signed)
-----   Message from Charlie Pitter, [place] sent at 10/08/2015  9:38 AM EDT ----- Echo shows heart is strong but siff. There are a few abnormalities we wil follow over time - some of her heart valves are either narrowed or stiff - mild AS, mild AR, mild MR, mild TR. Overall similar to prior. Please find out how her SOB is after starting the inhaler. If it is no better, I would advise  increasing Lasix to 20mg  daily and following up as planned.  Dayna Dunn PA-C

## 2015-10-09 NOTE — Telephone Encounter (Signed)
Pt has been made aware of her echo results. She stated that she couldn't see any different in her sob with using the inhaler. She was advised to increase Lasix to 20 mg qd Pt verbalized understanding.

## 2015-10-12 DIAGNOSIS — H01001 Unspecified blepharitis right upper eyelid: Secondary | ICD-10-CM | POA: Diagnosis not present

## 2015-10-12 DIAGNOSIS — H353132 Nonexudative age-related macular degeneration, bilateral, intermediate dry stage: Secondary | ICD-10-CM | POA: Diagnosis not present

## 2015-10-12 DIAGNOSIS — H04123 Dry eye syndrome of bilateral lacrimal glands: Secondary | ICD-10-CM | POA: Diagnosis not present

## 2015-10-12 DIAGNOSIS — H5201 Hypermetropia, right eye: Secondary | ICD-10-CM | POA: Diagnosis not present

## 2015-10-15 DIAGNOSIS — Z23 Encounter for immunization: Secondary | ICD-10-CM | POA: Diagnosis not present

## 2015-10-18 ENCOUNTER — Encounter: Payer: Self-pay | Admitting: Physician Assistant

## 2015-10-18 ENCOUNTER — Ambulatory Visit (INDEPENDENT_AMBULATORY_CARE_PROVIDER_SITE_OTHER): Payer: Medicare Other | Admitting: Physician Assistant

## 2015-10-18 VITALS — BP 134/64 | HR 69 | Ht 65.0 in | Wt 152.0 lb

## 2015-10-18 DIAGNOSIS — I350 Nonrheumatic aortic (valve) stenosis: Secondary | ICD-10-CM

## 2015-10-18 DIAGNOSIS — I5032 Chronic diastolic (congestive) heart failure: Secondary | ICD-10-CM

## 2015-10-18 DIAGNOSIS — I493 Ventricular premature depolarization: Secondary | ICD-10-CM | POA: Diagnosis not present

## 2015-10-18 DIAGNOSIS — I10 Essential (primary) hypertension: Secondary | ICD-10-CM | POA: Diagnosis not present

## 2015-10-18 DIAGNOSIS — R0609 Other forms of dyspnea: Secondary | ICD-10-CM

## 2015-10-18 MED ORDER — BISOPROLOL FUMARATE 5 MG PO TABS: 5.0000 mg | ORAL_TABLET | Freq: Every day | ORAL | 1 refills | Status: DC

## 2015-10-18 MED ORDER — BISOPROLOL FUMARATE 10 MG PO TABS: 10.0000 mg | ORAL_TABLET | Freq: Every day | ORAL | 1 refills | Status: DC

## 2015-10-18 NOTE — Patient Instructions (Signed)
Medication Instructions:  Your physician has recommended you make the following change in your medication:  1. Discontinue nebivolol  (10 mg ) daily 2. Start bisoprolol (5 mg ) daily sent in today to patient's requested pharmacy.    Labwork: Your physician recommends that you have lab work today: bmet/mag/bnp   Testing/Procedures: -None  Follow-Up: Your physician recommends that you keep your scheduled  follow-up appointment with Dr. Curt Bears.    Any Other Special Instructions Will Be Listed Below (If Applicable).     If you need a refill on your cardiac medications before your next appointment, please call your pharmacy.

## 2015-10-18 NOTE — Progress Notes (Signed)
Cardiology Office Note    Date:  10/18/2015  ID:  [name], DOB October 03, 1921, MRN [id number] PCP:  [name], MD  Cardiologist:  [name]   Chief Complaint: f/u SOB, PVCs  History of Present Illness:  [name] is a 82 y.o. female with history of HTN, hyperlipidemia, hypothyroidism, mild AS by [id number], chronic diastolic CHF, hypertensive heart disease, frequent PVCs, and possible prior tachy-brady syndrome (although not clear if PVCs were causing perception of bradycardia). She presents for f/u of SOB.   To recap, she has history of near-syncope/dizziness in 2014 - was diagnosed with vertigo. Holter 08/2012: min HR 59, max 100, mean 76, longest pause 1.63 sec, 1651 PVCs in 24 hours (1.5% of total QRS). Event monitor 09/2012: NSR, freq PVCs. Normal nuc 2010. 2D echo 08/2013: mild LVH, EF 55-60%, grade 1 DD, mild AS, calcified mitral annulus, mild LAE. Per review of chart, she was admitted to the [facility] 09/2013 with acute on chronic diastolic CHF, frequent PVCs (contributing to a low effective HR by count), and UTI. She was seen in the ER in 09/2015 complaining of lack of energy, exertional DOE, and chronic dizziness. Her caregiver had felt here pulse and noted it was in the 40s so she went to her PCP at which time she was sent to the ER for concern for ventricular bigeminy and possible bradycardia. The office note from her PCP visit cites a HR of 45 but EKG from same visit showed HR 88. While in the ER, HR was in the 80s, frequent PVCs on telemetry. The EDP spoke with Dr. Radford Pax who felt she may have pseudobradycardia due to the frequent PVCs not being palpable by manual pulse check. The ER also sent her home with abx for UTI. CXR: bronchitis and chronic interstitial changes, no acute abnormalities, aortic atherosclerosis. Troponin neg x2. TSH, free T4. CBC wnl, BMET with K 3.9, Cr 0.78.  I saw her in follow-up 09/28/15 at which she was describing worsening exertional dyspnea - present for  many years but worse over the past few months. She was not having any chest pain. 2D echo 10/05/15 showed EF 55-60%, no RWMA, grade 2 DD, doppler parameters c/w high vent filling pressure, mild AS, mild MR, mild PR, PASP 33. She tried an inhaler with no significant relief and was placed back on Lasix 20mg  daily. 48 Hour Holter 10/08/15 showed NSR and sinus tach with frequent PVCs, occasional bigeminy, total PVC burden 16.5% (35,000+ PVCs), lowest HR 59.  She returns to clinic feeling the same as before - still SOB with minimal exertion. No chest pain. No syncope. Chronic dizziness remains the same, worse with head position changes, not necessarily orthostatic in nature.  Past Medical History:  Diagnosis Date  . Anxiety   . Aortic stenosis    a. mild by echo 2017.  . Arthritis   . Bradycardia    a. ? details not clear - in the past has had PVCs which have lowered effective HR count by pulse check.  . Chronic diastolic CHF (congestive heart failure) ([place])   . Diastolic dysfunction    mild   . Frequent PVCs    a. 48 Hour Holter 10/08/15 showed NSR and sinus tach with frequent PVCs, occasional bigeminy, total PVC burden 16.5%, lowest HR 59.  [name] Hyperlipidemia   . Hypertension   . Hypertensive heart disease   . Hypothyroidism   . Kidney stones   . Mild mitral regurgitation by prior echocardiogram   .  Tachycardia    history of tachycardia and bradycardia    Past Surgical History:  Procedure Laterality Date  . APPENDECTOMY    . CHOLECYSTECTOMY    . EXTRACORPOREAL SHOCK WAVE LITHOTRIPSY    . EYE SURGERY Bilateral    cataract surgery with lens implant  . ORIF HUMERUS FRACTURE Right 10/31/2013   Procedure: Open Reduction Internal Fixation Right Proximal Humerus Fracture;  Surgeon: Marybelle Killings, MD;  Location: [facility];  Service: Orthopedics;  Laterality: Right;  . TONSILLECTOMY    . TOTAL ABDOMINAL HYSTERECTOMY      Current Medications: Current Outpatient Prescriptions  Medication Sig  Dispense Refill  . acetaminophen (TYLENOL) 325 MG tablet Take 325 mg by mouth 2 (two) times daily as needed for mild pain.     [name] albuterol (PROVENTIL HFA;VENTOLIN HFA) 108 (90 Base) MCG/ACT inhaler 2 puffs inhaled every 4-6 hours as needed for shortness of breath 1 Inhaler 1  . ALPRAZolam (XANAX) 0.25 MG tablet Take 0.125 mg by mouth daily as needed for anxiety.     [name] amLODipine (NORVASC) 5 MG tablet Take 1 tablet (5 mg total) by mouth daily. 30 tablet 0  . cholecalciferol (VITAMIN D) 1000 units tablet Take 1,000 Units by mouth daily.    . furosemide (LASIX) 20 MG tablet Take 1 tablet (20 mg total) by mouth daily. 90 tablet 3  . levothyroxine (SYNTHROID, LEVOTHROID) 50 MCG tablet Take 50 mcg by mouth daily.  0  . Multiple Vitamins-Minerals (PRESERVISION AREDS) CAPS Take 1 capsule by mouth 2 (two) times daily.    . nebivolol (BYSTOLIC) 10 MG tablet Take 1 tablet (10 mg total) by mouth daily. 90 tablet 3  . pravastatin (PRAVACHOL) 20 MG tablet Take 20 mg by mouth every evening.     . sertraline (ZOLOFT) 25 MG tablet Take 25 mg by mouth every morning.      No current facility-administered medications for this visit.      Allergies:   Review of patient's allergies indicates no known allergies.   Social History   Social History  . Marital status: Married    Spouse name: N/A  . Number of children: N/A  . Years of education: N/A   Social History Main Topics  . Smoking status: Never Smoker  . Smokeless tobacco: Never Used  . Alcohol use No  . Drug use: No  . Sexual activity: Not Currently   Other Topics Concern  . Not on file   Social History Narrative  . No narrative on file     Family History:  The patient's family history includes Cancer in her brother; Emphysema in her father; Heart attack in her brother; Heart failure in her mother; Stroke in her brother.   ROS:   Please see the history of present illness.  All other systems are reviewed and otherwise negative.    PHYSICAL  EXAM:   VS:  BP 134/64   Pulse 69   Ht 5\' 5"  (1.651 m)   Wt 152 lb (68.9 kg)   BMI 25.29 kg/m   BMI: Body mass index is 25.29 kg/m. GEN: Well nourished, well developed elderly WF in no acute distress  HEENT: normocephalic, atraumatic Neck: no JVD, carotid bruits, or masses Cardiac: RRR occasional ectopy; soft SEM, no rubs or gallops, no edema  Respiratory:  Rare insp wheeze, good air movement, coarse BS at bases, no rhonchi GI: soft, nontender, nondistended, + BS MS: no deformity or atrophy  Skin: warm and dry, no rash Neuro:  Alert and Oriented x 3, Strength and sensation are intact, follows commands Psych: euthymic mood, full affect  Wt Readings from Last 3 Encounters:  10/18/15 152 lb (68.9 kg)  09/28/15 150 lb 12.8 oz (68.4 kg)  09/27/15 150 lb (68 kg)      Studies/Labs Reviewed:   EKG:  EKG was ordered today and personally reviewed by me and demonstrates NSR 69bpm occ PACs, no acute changes   Recent Labs: 09/27/2015: BUN 13; Creatinine, Ser 0.78; Hemoglobin 12.9; Platelets 318; Potassium 3.9; Sodium 139; TSH 3.092   Lipid Panel    Component Value Date/Time   CHOL 181 10/29/2011 0829   TRIG 118 10/29/2011 0829   HDL 55 10/29/2011 0829   CHOLHDL 3.3 10/29/2011 0829   VLDL 24 10/29/2011 0829   LDLCALC 102 (H) 10/29/2011 0829    Additional studies/ records that were reviewed today include: Summarized above.    ASSESSMENT & PLAN:   1. Dyspnea on exertion/frequent PVCs - monitor showed 35,000+ PVCs. I suspect this is contributing to her dyspnea. I reviewed with Dr. Acie Fredrickson. Cannot push BB up further given her wheezing. Will d/c nebivolol and try equivalent dose of bisoprolol (5mg  daily). Will have her see EP to determine if antiarrhythmic therapy is next step. We briefly discussed stress testing today to exclude ischemia as an underlying cause for her PVCs. However, she is not a candidate for the treadmill due to chronic dizziness and has some wheezing so is less ideal  for Lexiscan. Per our discussion in clinic today, will attempt to control PVCs first with medication - if symptoms persist or do not improve, can consider nuclear stress test if wheezing has improved (versus entertain the idea of a dobutamine stress test). Will also await EP input whether they think this would be helpful in the management of her ectopy. Will add Mg level to labs today. 2. Chronic diastolic CHF - will add BNP to labs today along with BMET since she's been taking Lasix daily instead of PRN. 3. Essential HTN - controlled. Follow. 4. Mild aortic stenosis - follow clinically for now. Only mild by recent echo.  Disposition: Refer to EP within 1 week.   Medication Adjustments/Labs and Tests Ordered: Current medicines are reviewed at length with the patient today.  Concerns regarding medicines are outlined above. Medication changes, Labs and Tests ordered today are summarized above and listed in the Patient Instructions accessible in Encounters.   Raechel Ache PA-C  10/18/2015 2:51 PM    [facility] [place], [place], [place]  [address] Phone: [phone]; Fax: [phone]

## 2015-10-19 LAB — BASIC METABOLIC PANEL
BUN: 18 mg/dL (ref 7–25)
CALCIUM: 9.2 mg/dL (ref 8.6–10.4)
CO2: 28 mmol/L (ref 20–31)
CREATININE: 0.79 mg/dL (ref 0.60–0.88)
Chloride: 105 mmol/L (ref 98–110)
Glucose, Bld: 99 mg/dL (ref 65–99)
Potassium: 4.7 mmol/L (ref 3.5–5.3)
SODIUM: 142 mmol/L (ref 135–146)

## 2015-10-19 LAB — BRAIN NATRIURETIC PEPTIDE: BRAIN NATRIURETIC PEPTIDE: 49.9 pg/mL (ref <100)

## 2015-10-19 LAB — MAGNESIUM: MAGNESIUM: 2.4 mg/dL (ref 1.5–2.5)

## 2015-10-29 ENCOUNTER — Ambulatory Visit (INDEPENDENT_AMBULATORY_CARE_PROVIDER_SITE_OTHER): Payer: Medicare Other | Admitting: Cardiology

## 2015-10-29 ENCOUNTER — Encounter: Payer: Self-pay | Admitting: Cardiology

## 2015-10-29 VITALS — BP 132/68 | HR 77 | Ht 65.0 in | Wt 150.1 lb

## 2015-10-29 DIAGNOSIS — I493 Ventricular premature depolarization: Secondary | ICD-10-CM | POA: Diagnosis not present

## 2015-10-29 MED ORDER — AMIODARONE HCL 200 MG PO TABS: ORAL_TABLET | ORAL | 3 refills | Status: DC

## 2015-10-29 NOTE — Patient Instructions (Signed)
Medication Instructions:    Your physician has recommended you make the following change in your medication:   1) START Amiodarone  -- \  Take 400 mg twice daily for 2 weeks   Take 400 mg once daily for 2 weeks   Take 200 mg daily  --- If you need a refill on your cardiac medications before your next appointment, please call your pharmacy. ---  Labwork:  None ordered  Testing/Procedures:  None ordered  Follow-Up:  Your physician recommends that you schedule a follow-up appointment in: 3 months with Dr. Curt Bears.   Thank you for choosing [facility]!!   Trinidad Curet, RN [phone]    Any Other Special Instructions Will Be Listed Below (If Applicable). Amiodarone tablets What is this medicine? AMIODARONE (a MEE oh da rone) is an antiarrhythmic drug. It helps make your heart beat regularly. Because of the side effects caused by this medicine, it is only used when other medicines have not worked. It is usually used for heartbeat problems that may be life threatening. This medicine may be used for other purposes; ask your health care provider or pharmacist if you have questions. What should I tell my health care provider before I take this medicine? They need to know if you have any of these conditions: -liver disease -lung disease -other heart problems -thyroid disease -an unusual or allergic reaction to amiodarone, iodine, other medicines, foods, dyes, or preservatives -pregnant or trying to get pregnant -breast-feeding How should I use this medicine? Take this medicine by mouth with a glass of water. Follow the directions on the prescription label. You can take this medicine with or without food. However, you should always take it the same way each time. Take your doses at regular intervals. Do not take your medicine more often than directed. Do not stop taking except on the advice of your doctor or health care professional. A special MedGuide will be given to you  by the pharmacist with each prescription and refill. Be sure to read this information carefully each time. Talk to your pediatrician regarding the use of this medicine in children. Special care may be needed. Overdosage: If you think you have taken too much of this medicine contact a poison control center or emergency room at once. NOTE: This medicine is only for you. Do not share this medicine with others. What if I miss a dose? If you miss a dose, take it as soon as you can. If it is almost time for your next dose, take only that dose. Do not take double or extra doses. What may interact with this medicine? Do not take this medicine with any of the following medications: -abarelix -apomorphine -arsenic trioxide -certain antibiotics like erythromycin, gemifloxacin, levofloxacin, pentamidine -certain medicines for depression like amoxapine, tricyclic antidepressants -certain medicines for fungal infections like fluconazole, itraconazole, ketoconazole, posaconazole, voriconazole -certain medicines for irregular heart beat like disopyramide, dofetilide, dronedarone, ibutilide, propafenone, sotalol -certain medicines for malaria like chloroquine, halofantrine -cisapride -droperidol -haloperidol -hawthorn -maprotiline -methadone -phenothiazines like chlorpromazine, mesoridazine, thioridazine -pimozide -ranolazine -red yeast rice -vardenafil -ziprasidone This medicine may also interact with the following medications: -antiviral medicines for HIV or AIDS -certain medicines for blood pressure, heart disease, irregular heart beat -certain medicines for cholesterol like atorvastatin, cerivastatin, lovastatin, simvastatin -certain medicines for hepatitis C like sofosbuvir and ledipasvir; sofosbuvir -certain medicines for seizures like phenytoin -certain medicines for thyroid problems -certain medicines that treat or prevent blood clots like  warfarin -cholestyramine -cimetidine -clopidogrel -cyclosporine -dextromethorphan -diuretics -  fentanyl -general anesthetics -grapefruit juice -lidocaine -loratadine -methotrexate -other medicines that prolong the QT interval (cause an abnormal heart rhythm) -procainamide -quinidine -rifabutin, rifampin, or rifapentine -St. John's Wort -trazodone This list may not describe all possible interactions. Give your health care provider a list of all the medicines, herbs, non-prescription drugs, or dietary supplements you use. Also tell them if you smoke, drink alcohol, or use illegal drugs. Some items may interact with your medicine. What should I watch for while using this medicine? Your condition will be monitored closely when you first begin therapy. Often, this drug is first started in a hospital or other monitored health care setting. Once you are on maintenance therapy, visit your doctor or health care professional for regular checks on your progress. Because your condition and use of this medicine carry some risk, it is a good idea to carry an identification card, necklace or bracelet with details of your condition, medications, and doctor or health care professional. Dennis Bast may get drowsy or dizzy. Do not drive, use machinery, or do anything that needs mental alertness until you know how this medicine affects you. Do not stand or sit up quickly, especially if you are an older patient. This reduces the risk of dizzy or fainting spells. This medicine can make you more sensitive to the sun. Keep out of the sun. If you cannot avoid being in the sun, wear protective clothing and use sunscreen. Do not use sun lamps or tanning beds/booths. You should have regular eye exams before and during treatment. Call your doctor if you have blurred vision, see halos, or your eyes become sensitive to light. Your eyes may get dry. It may be helpful to use a lubricating eye solution or artificial tears  solution. If you are going to have surgery or a procedure that requires contrast dyes, tell your doctor or health care professional that you are taking this medicine. What side effects may I notice from receiving this medicine? Side effects that you should report to your doctor or health care professional as soon as possible: -allergic reactions like skin rash, itching or hives, swelling of the face, lips, or tongue -blue-gray coloring of the skin -blurred vision, seeing blue green halos, increased sensitivity of the eyes to light -breathing problems -chest pain -dark urine -fast, irregular heartbeat -feeling faint or light-headed -intolerance to heat or cold -nausea or vomiting -pain and swelling of the scrotum -pain, tingling, numbness in feet, hands -redness, blistering, peeling or loosening of the skin, including inside the mouth -spitting up blood -stomach pain -sweating -unusual or uncontrolled movements of body -unusually weak or tired -weight gain or loss -yellowing of the eyes or skin Side effects that usually do not require medical attention (report to your doctor or health care professional if they continue or are bothersome): -change in sex drive or performance -constipation -dizziness -headache -loss of appetite -trouble sleeping This list may not describe all possible side effects. Call your doctor for medical advice about side effects. You may report side effects to FDA at 1-800-FDA-1088. Where should I keep my medicine? Keep out of the reach of children. Store at room temperature between 20 and 25 degrees C (68 and 77 degrees F). Protect from light. Keep container tightly closed. Throw away any unused medicine after the expiration date. NOTE: This sheet is a summary. It may not cover all possible information. If you have questions about this medicine, talk to your doctor, pharmacist, or health care provider.    2016, Elsevier/Gold Standard. (  2013-04-04  19:48:11)        

## 2015-10-29 NOTE — Progress Notes (Signed)
Electrophysiology Office Note   Date:  10/29/2015   ID:  [name], [name] 22-Dec-1921, MRN [id number]  PCP:  Diane Heck, MD  Cardiologist:  [name] Primary Electrophysiologist:  Diane Glover [name], MD    Chief Complaint  Patient presents with  . New Patient (Initial Visit)    PVC's  . Shortness of Breath  . Dizziness     History of Present Illness: [name] is a 82 y.o. female who presents today for electrophysiology evaluation.  History of HTN, hyperlipidemia, hypothyroidism, mild AS by [id number], chronic diastolic CHF, hypertensive heart disease, frequent PVCs, and possible prior tachy-brady syndrome (although not clear if PVCs were causing perception of bradycardia).   she has history of near-syncope/dizziness in 2014 - was diagnosed with vertigo. Holter 08/2012: min HR 59, max 100, mean 76, longest pause 1.63 sec, 1651 PVCs in 24 hours (1.5% of total QRS). Event monitor 09/2012: NSR, freq PVCs. Normal nuc 2010. 2D echo 08/2013: mild LVH, EF 55-60%, grade 1 DD, mild AS, calcified mitral annulus, mild LAE. Per review of chart, she was admitted to the [facility] 09/2013 with acute on chronic diastolic CHF, frequent PVCs (contributing to a low effective HR by count), and UTI. She was seen in the ER in 09/2015 complaining of lack of energy, exertional DOE, and chronic dizziness. Her caregiver had felt her pulse and noted it was in the 40s so she went to her PCP at which time she was sent to the ER for concern for ventricular bigeminy and possible bradycardia. The office note from her PCP visit cites a HR of 45 but EKG from same visit showed HR 88. While in the ER, HR was in the 80s, frequent PVCs on telemetry. The EDP spoke with Dr. Radford Pax who felt she may have pseudobradycardia due to the frequent PVCs not being palpable by manual pulse check.   Today, she denies symptoms of palpitations, chest pain,  orthopnea, PND, lower extremity edema, claudication, dizziness, presyncope,  syncope, bleeding, or neurologic sequela. The patient is tolerating medications without difficulties.   She does continue to be short of breath. She says she is short of breath when doing almost any activity. She says the only time that she is not short of breath is when she is sitting still and laying down in her bed. She otherwise has been doing well. She has no chest pain, PND, or orthopnea.   Past Medical History:  Diagnosis Date  . Anxiety   . Aortic stenosis    a. mild by echo 2017.  . Arthritis   . Bradycardia    a. ? details not clear - in the past has had PVCs which have lowered effective HR count by pulse check.  . Chronic diastolic CHF (congestive heart failure) ([name])   . Diastolic dysfunction    mild   . Frequent PVCs    a. 48 Hour Holter 10/08/15 showed NSR and sinus tach with frequent PVCs, occasional bigeminy, total PVC burden 16.5%, lowest HR 59.  [name] Hyperlipidemia   . Hypertension   . Hypertensive heart disease   . Hypothyroidism   . Kidney stones   . Mild mitral regurgitation by prior echocardiogram   . Tachycardia    history of tachycardia and bradycardia   Past Surgical History:  Procedure Laterality Date  . APPENDECTOMY    . CHOLECYSTECTOMY    . EXTRACORPOREAL SHOCK WAVE LITHOTRIPSY    . EYE SURGERY Bilateral    cataract surgery with lens implant  .  ORIF HUMERUS FRACTURE Right 10/31/2013   Procedure: Open Reduction Internal Fixation Right Proximal Humerus Fracture;  Surgeon: Marybelle Killings, MD;  Location: [facility];  Service: Orthopedics;  Laterality: Right;  . TONSILLECTOMY    . TOTAL ABDOMINAL HYSTERECTOMY       Current Outpatient Prescriptions  Medication Sig Dispense Refill  . acetaminophen (TYLENOL) 325 MG tablet Take 325 mg by mouth 2 (two) times daily as needed for mild pain.     [name] albuterol (PROVENTIL HFA;VENTOLIN HFA) 108 (90 Base) MCG/ACT inhaler 2 puffs inhaled every 4-6 hours as needed for shortness of breath 1 Inhaler 1  . ALPRAZolam (XANAX) 0.25  MG tablet Take 0.125 mg by mouth daily as needed for anxiety.     [name] amLODipine (NORVASC) 5 MG tablet Take 1 tablet (5 mg total) by mouth daily. 30 tablet 0  . bisoprolol (ZEBETA) 5 MG tablet Take 1 tablet (5 mg total) by mouth daily. 30 tablet 1  . cholecalciferol (VITAMIN D) 1000 units tablet Take 1,000 Units by mouth daily.    . furosemide (LASIX) 20 MG tablet Take 1 tablet (20 mg total) by mouth daily. 90 tablet 3  . levothyroxine (SYNTHROID, LEVOTHROID) 50 MCG tablet Take 50 mcg by mouth daily.  0  . pravastatin (PRAVACHOL) 20 MG tablet Take 20 mg by mouth every evening.     . sertraline (ZOLOFT) 25 MG tablet Take 25 mg by mouth every morning.      No current facility-administered medications for this visit.     Allergies:   Review of patient's allergies indicates no known allergies.   Social History:  The patient  reports that she has never smoked. She has never used smokeless tobacco. She reports that she does not drink alcohol or use drugs.   Family History:  The patient's family history includes Cancer in her brother; Emphysema in her father; Heart attack in her brother; Heart failure in her mother; Stroke in her brother.    ROS:  Please see the history of present illness.   Otherwise, review of systems is positive for none.   All other systems are reviewed and negative.    PHYSICAL EXAM: VS:  BP 132/68   Pulse 77   Ht 5\' 5"  (1.651 m)   Wt 150 lb 1.9 oz (68.1 kg)   BMI 24.98 kg/m  , BMI Body mass index is 24.98 kg/m. GEN: Well nourished, well developed, in no acute distress  HEENT: normal  Neck: no JVD, carotid bruits, or masses Cardiac: RRR; no murmurs, rubs, or gallops,no edema  Respiratory:  clear to auscultation bilaterally, normal work of breathing GI: soft, nontender, nondistended, + BS MS: no deformity or atrophy  Skin: warm and dry Neuro:  Strength and sensation are intact Psych: euthymic mood, full affect  EKG:  EKG is ordered today. Personal review of the  ekg ordered shows sinus rhythm, PVCs, rate 77  Recent Labs: 09/27/2015: Hemoglobin 12.9; Platelets 318; TSH 3.092 10/18/2015: Brain Natriuretic Peptide 49.9; BUN 18; Creat 0.79; Magnesium 2.4; Potassium 4.7; Sodium 142    Lipid Panel     Component Value Date/Time   CHOL 181 10/29/2011 0829   TRIG 118 10/29/2011 0829   HDL 55 10/29/2011 0829   CHOLHDL 3.3 10/29/2011 0829   VLDL 24 10/29/2011 0829   LDLCALC 102 (H) 10/29/2011 0829     Wt Readings from Last 3 Encounters:  10/29/15 150 lb 1.9 oz (68.1 kg)  10/18/15 152 lb (68.9 kg)  09/28/15 150 lb  12.8 oz (68.4 kg)      Other studies Reviewed: Additional studies/ records that were reviewed today include: TTE 10/05/15  Review of the above records today demonstrates:   - Left ventricle: The cavity size was normal. Systolic function was   normal. The estimated ejection fraction was in the range of 55%   to 60%. Wall motion was normal; there were no regional wall   motion abnormalities. Features are consistent with a pseudonormal   left ventricular filling pattern, with concomitant abnormal   relaxation and increased filling pressure (grade 2 diastolic   dysfunction). Doppler parameters are consistent with high   ventricular filling pressure. - Aortic valve: Moderately calcified annulus. Trileaflet; normal   thickness, mildly calcified leaflets. There was mild stenosis.   There was mild regurgitation. Valve area (VTI): 1.12 cm^2. Valve   area (Vmax): 1.09 cm^2. Valve area (Vmean): 1.04 cm^2. - Mitral valve: Calcified annulus. There was mild regurgitation. - Right atrium: The atrium was mildly dilated. - Atrial septum: There was increased thickness of the septum,   consistent with lipomatous hypertrophy. - Pulmonic valve: There was mild regurgitation. - Pulmonary arteries: PA peak pressure: 33 mm Hg (S).  Holter 10/08/15 NSR and sinus tach with frequent PVCs with occasional ventricular bigeminy. Total PVC burden = 16.5%.  Clinical correlation recommended.    ASSESSMENT AND PLAN:  1.  PVCs: PVCs have a left bundle characterization and are isoelectric to negative in lead 2 and aVF, positive in lead 2. Possibly outflow tract closer to the conduction system as they are quite narrow. She is short of breath could possibly be caused by her PVCs. I discussed with her and her husband the possibility of starting her on amiodarone, which they are agreeable. If her symptoms are not decreased by amiodarone, Escher Harr plan for possible referral to pulmonary.  2. Chronic diastolic heart failure: Currently well compensated.   3. Hypertension: well controlled  Current medicines are reviewed at length with the patient today.   The patient does not have concerns regarding her medicines.  The following changes were made today:  amiodarone  Labs/ tests ordered today include:  No orders of the defined types were placed in this encounter.    Disposition:   FU with Sahvannah Rieser 3 months  Signed, Embry Manrique [name], MD  10/29/2015 2:10 PM     [address] [place] [address] [phone] (office) [phone] (fax)

## 2015-11-05 ENCOUNTER — Telehealth: Payer: Self-pay | Admitting: Cardiology

## 2015-11-05 NOTE — Telephone Encounter (Signed)
New message   Pt C/O medication issue:  1. Name of Medication: amiodarone (PACERONE) 200 MG tablet  2. How are you currently taking this medication (dosage and times per day)? 400 mg twice daily for 2 weeks.   3. Are you having a reaction (difficulty breathing--STAT)? Yes.  4. What is your medication issue? Mostly with stomach and constipation - more lightheaded.

## 2015-11-05 NOTE — Telephone Encounter (Signed)
I spoke with the patient.  She was seen by Dr. Curt Bears on 10/29/15 and started on a loading dose of amiodarone 400 mg BID for PVC's. The patient states that she took the first dose a week ago today.  The day after, she started to have symptoms of stomach "churning/ bloating," constipation, some dizziness. She is SOB, but this has been ongoing for her and is not different.  She was out of town last week, so has not called until today to report symptoms. Confirmed she is still taking amiodarone 400 mg BID, but has not had her dose this morning. BP/ (HR) yesterday: 1pm- 116/76 (40) & 6pm- 139/72 (53) She is currently not in distress. I advised the patient that I will need to review with Dr. Curt Bears. She is aware that he is not in the office today, but will forward the message to him for review.  I advised we will be back in touch with her with MD recommendations as soon as possible. She is agreeable.

## 2015-11-07 MED ORDER — AMIODARONE HCL 200 MG PO TABS: ORAL_TABLET | ORAL | Status: DC

## 2015-11-07 NOTE — Telephone Encounter (Signed)
I spoke with the patient. She is aware of Dr. Macky Lower recommendations to decrease amiodarone to 200 mg BID x 1 month, then 200 mg once daily after that. She is agreeable with recommendations and confirms she does currently have the 200 mg tablets of amio. I have advised her to try the lower dose and let us know how she does with this.  She voices understanding.

## 2015-11-07 NOTE — Telephone Encounter (Signed)
Can decrease dose to 200 mg BID for one month then 200 mg daily.

## 2015-11-09 ENCOUNTER — Encounter: Payer: Self-pay | Admitting: Cardiology

## 2015-11-09 DIAGNOSIS — E7800 Pure hypercholesterolemia, unspecified: Secondary | ICD-10-CM | POA: Diagnosis not present

## 2015-11-09 DIAGNOSIS — Z79899 Other long term (current) drug therapy: Secondary | ICD-10-CM | POA: Diagnosis not present

## 2015-11-09 DIAGNOSIS — E039 Hypothyroidism, unspecified: Secondary | ICD-10-CM | POA: Diagnosis not present

## 2015-11-09 DIAGNOSIS — F339 Major depressive disorder, recurrent, unspecified: Secondary | ICD-10-CM | POA: Diagnosis not present

## 2015-11-09 DIAGNOSIS — I509 Heart failure, unspecified: Secondary | ICD-10-CM | POA: Diagnosis not present

## 2015-11-09 DIAGNOSIS — R001 Bradycardia, unspecified: Secondary | ICD-10-CM | POA: Diagnosis not present

## 2015-11-09 DIAGNOSIS — I10 Essential (primary) hypertension: Secondary | ICD-10-CM | POA: Diagnosis not present

## 2015-11-09 DIAGNOSIS — F419 Anxiety disorder, unspecified: Secondary | ICD-10-CM | POA: Diagnosis not present

## 2015-11-12 ENCOUNTER — Encounter: Payer: Self-pay | Admitting: Cardiology

## 2015-11-12 DIAGNOSIS — E878 Other disorders of electrolyte and fluid balance, not elsewhere classified: Secondary | ICD-10-CM | POA: Diagnosis not present

## 2015-11-12 DIAGNOSIS — E039 Hypothyroidism, unspecified: Secondary | ICD-10-CM | POA: Diagnosis not present

## 2015-11-14 ENCOUNTER — Telehealth: Payer: Self-pay | Admitting: *Deleted

## 2015-11-14 NOTE — Telephone Encounter (Signed)
Called to follow up with pt. Last Friday, 10/27, Dr. Curt Bears received call while pt was at her PCP, HR in the 30s.  Beta blocker was stopped. Pt tells me she is doing good.  States she is doing much better than she was. HR normalized since stopping medication. I will remove Bisoprolol from her med list. She thanks me for checking in with her.

## 2015-11-21 DIAGNOSIS — L814 Other melanin hyperpigmentation: Secondary | ICD-10-CM | POA: Diagnosis not present

## 2015-11-21 DIAGNOSIS — L570 Actinic keratosis: Secondary | ICD-10-CM | POA: Diagnosis not present

## 2015-11-21 DIAGNOSIS — Z23 Encounter for immunization: Secondary | ICD-10-CM | POA: Diagnosis not present

## 2015-11-28 DIAGNOSIS — E039 Hypothyroidism, unspecified: Secondary | ICD-10-CM | POA: Diagnosis not present

## 2015-12-03 ENCOUNTER — Telehealth: Payer: Self-pay | Admitting: Cardiovascular Disease

## 2015-12-03 NOTE — Telephone Encounter (Signed)
Pt husband is calling because Dr.Barnes changed her rx and she wanted them to let Dr.Nasher know about the change

## 2015-12-03 NOTE — Telephone Encounter (Signed)
Will,  Forwarding you this info since you started [name] on the [employer]

## 2015-12-03 NOTE — Telephone Encounter (Signed)
Spoke with patient's husband who called to report that Dr. Drema Dallas wanted Dr. Acie Fredrickson to be aware she has started patient on Levothyroxine 12.5 mcg daily for hypothyroid (husband said hyperthyroid) and patient takes Amiodarone 200 mg once daily. She advised patient to follow Dr. Elmarie Shiley instructions regarding this medication.  I advised that I will forward to Dr. Acie Fredrickson and call back with his advice.  Husband verbalized understanding and agreement.

## 2015-12-05 NOTE — Telephone Encounter (Signed)
Would continue amiodarone for now. If her PVCs are not decreased on the next check would plan for another drug.  As the amiodarone was recently started it is unlikely that it was the cause of her hypothyroidism.

## 2015-12-10 NOTE — Telephone Encounter (Signed)
Spoke with patient's husband and reviewed Dr. Macky Lower advice for patient to continue amiodarone 200 mg once daily.  Husband states patient is feeling well on this dose.  I advised him to call back if patient has problems or concerns prior to January follow-up.  He verbalized understanding and agreement and thanked me for the call.

## 2016-01-09 ENCOUNTER — Telehealth: Payer: Self-pay | Admitting: Cardiology

## 2016-01-09 NOTE — Telephone Encounter (Signed)
Reports dizziness reoccurring over the past few weeks. States that it went away for awhile, after "starting that new medication the doctor put me on" (Amiodarone).  But symptom returned 2 weeks ago.  Pt denies near syncope/syncope.  Reports episodes of dizziness occuring several times a day. She also tells me that her SOB is still a problem but has improved. SBP ranging from 135-144. HR in the 40-50s according to pt.  (Hx of bradycardia and frequent PVCs) Reviewed with pt reasoning to go to E.D. and when to call office back.  Advised that I will forward to Dr. Curt Bears for advisement and call her back by the end of the week with recommendation/s. Patient verbalized understanding and agreeable to plan.

## 2016-01-09 NOTE — Telephone Encounter (Signed)
New Message   Patient experiencing dizziness, and want to speak with nurse. No other symptons.

## 2016-01-11 NOTE — Telephone Encounter (Signed)
Pt describes dizziness episodes - states they occur when she: leans over, sitting, gets out of bed in the morning, lying in bed and when she sits up on side of bed after waking.  Advised pt, per Dr. Curt Bears, to follow up w/ PCP to evaluate dizziness concern further.  Informed her that the description of dizziness does not sound cardiology related. She will also have HR evaluated while there.  She/PCP will call office if cardiology needs to be involved. She thanks me for helping.

## 2016-01-22 ENCOUNTER — Ambulatory Visit (INDEPENDENT_AMBULATORY_CARE_PROVIDER_SITE_OTHER): Payer: Medicare Other | Admitting: Cardiovascular Disease

## 2016-01-22 ENCOUNTER — Encounter: Payer: Self-pay | Admitting: Cardiovascular Disease

## 2016-01-22 VITALS — BP 124/80 | HR 80 | Ht 65.0 in | Wt 151.8 lb

## 2016-01-22 DIAGNOSIS — I5033 Acute on chronic diastolic (congestive) heart failure: Secondary | ICD-10-CM | POA: Diagnosis not present

## 2016-01-22 MED ORDER — AMIODARONE HCL 200 MG PO TABS: 200.0000 mg | ORAL_TABLET | ORAL | Status: DC

## 2016-01-22 NOTE — Patient Instructions (Signed)
Medication Instructions:  DECREASE Amiodarone to 200 mg on Mondays, Wednesdays, Fridays   Labwork: None Ordered   Testing/Procedures: None Ordered   Follow-Up: Your physician wants you to follow-up in: 6 months with Dr. Acie Fredrickson.  You will receive a reminder letter in the mail two months in advance. If you don't receive a letter, please call our office to schedule the follow-up appointment.   If you need a refill on your cardiac medications before your next appointment, please call your pharmacy.   Thank you for choosing [facility]! Christen Bame, RN [phone]

## 2016-01-22 NOTE — Progress Notes (Signed)
Cardiology Office Note   Date:  01/22/2016   ID:  [name], DOB September 27, 1921, MRN [id number]  PCP:  [name], MD  Cardiologist:   Mertie Moores, MD   Chief Complaint  Patient presents with  . Follow-up    CHF      History of Present Illness: [name] Diane a 83 y.o. female who presents for   1. Hypertension 2. Hyperlipidemia 3. Hypothyroidism  History of Present Illness:  [name] Diane an 83 year old female with a history of diastolic dysfunction, mild aortic stenosis and hypothyroidism. [name] also has a history of tachybradycardia syndrome. [name]'s had a stress Myoview study in May of 2010 which was normal. [name] has an ejection fraction of 75%. There was no ischemia. [name] had an echocardiogram performed in September, 2001 which revealed normal left ventricular systolic function with moderate left ventricular hypertrophy and mild aortic stenosis.  [name] presents today for followup visit. [name] does not have any complaints. [name] denies any chest pain or shortness of breath.   Sept. 12, 2014:  [name] [name] well now. [name] has had some dizziness and had several episodes of near syncope. Both episodes occurred while [name] was sitting. [name] was diagnosed with vertigo and was given meclizine. 24 hour Holter revealed NSR and 1 PVC.  We have an echocardiogram from October, 2013 which revealed normal left ventricular systolic function. [name] has mild aortic stenosis.  March 23, 2013:  [name] was last seen in Sept. 2014. Her BP has been a bit more variable. Her blood pressure varies between 150s to 120s. [name] does not eat any extra salt but [name] eats at the regular food bar at [facility] - her husband eats at the heart healthy food bar.   Sept. 8, 2015:  [name] Diane being more careful with her salt intake. [name] Diane feeling well. [name] has some DOE.  [name] was admitted to the hospital last month with some shortness breath. Her troponin levels were  negative. Echocardiogram revealed mild aortic stenosis.  Left ventricle: The cavity size was normal. Wall thickness was increased in a pattern of mild LVH. Systolic function was normal. The estimated ejection fraction was in the range of 55% to 60%. Doppler parameters are consistent with abnormal left ventricular relaxation (grade 1 diastolic dysfunction). - Aortic valve: Moderately calcified annulus. There was mild stenosis. There was trivial regurgitation. Valve area (VTI): 1.32 cm^2. Valve area (Vmax): 1.46 cm^2. Valve area (Vmean): 1.34 cm^2. - Mitral valve: Calcified annulus. - Left atrium: The atrium was mildly dilated. - Atrial septum: No defect or patent foramen ovale was identified  Oct. 7, 2015:  Spell was recently hospitalized for diastolic dysfunction. [name] had Lasix added to her medical regimen. [name] was also found to be mildly hypothyroid and her Synthroid dose was increased. [name]'s feeling better at this point. He apparently had some wheezing during her hospitalization and was prescribed a nebulizer. [name] has not been using the nebulizer and feels well.  Jan. 21, 2016:  [name] Diane seen back today for her diastolic dysfunction.  Breathing Diane ok.  Was in the Oct. For a right arm fracture ,  [name] had surgery.    Still very sore.    August 01, 2015:  [name] Diane seen back for follow up visit [name] well Still has some DOE with activity.   Does her own shopping ,  Keeps house ( husband helps) Walks with a cane on occasion   Jan.  9, 2018:  [name] very well - best in a long time .  No CP or worsening dyspnea.     Past Medical History:  Diagnosis Date  . Anxiety   . Aortic stenosis    a. mild by echo 2017.  . Arthritis   . Bradycardia    a. ? details not clear - in the past has had PVCs which have lowered effective HR count by pulse check.  . Chronic diastolic CHF (congestive heart failure) ([place])   . Diastolic dysfunction    mild   . Frequent PVCs    a. 48 Hour  Holter 10/08/15 showed NSR and sinus tach with frequent PVCs, occasional bigeminy, total PVC burden 16.5%, lowest HR 59.  [name] Hyperlipidemia   . Hypertension   . Hypertensive heart disease   . Hypothyroidism   . Kidney stones   . Mild mitral regurgitation by prior echocardiogram   . Tachycardia    history of tachycardia and bradycardia    Past Surgical History:  Procedure Laterality Date  . APPENDECTOMY    . CHOLECYSTECTOMY    . EXTRACORPOREAL SHOCK WAVE LITHOTRIPSY    . EYE SURGERY Bilateral    cataract surgery with lens implant  . ORIF HUMERUS FRACTURE Right 10/31/2013   Procedure: Open Reduction Internal Fixation Right Proximal Humerus Fracture;  Surgeon: Marybelle Killings, MD;  Location: [facility];  Service: Orthopedics;  Laterality: Right;  . TONSILLECTOMY    . TOTAL ABDOMINAL HYSTERECTOMY       Current Outpatient Prescriptions  Medication Sig Dispense Refill  . acetaminophen (TYLENOL) 325 MG tablet Take 325 mg by mouth 2 (two) times daily as needed for mild pain.     [name] ALPRAZolam (XANAX) 0.25 MG tablet Take 0.125 mg by mouth daily as needed for anxiety.     [name] amiodarone (PACERONE) 200 MG tablet Take one tablet (200 mg) by mouth twice daily x 1 month, then decrease to one tablet (200 mg) by mouth once daily (around 12/05/15). (Patient taking differently: Take 200 mg by mouth daily. )    . amLODipine (NORVASC) 5 MG tablet Take 1 tablet (5 mg total) by mouth daily. 30 tablet 0  . cholecalciferol (VITAMIN D) 1000 units tablet Take 2,000 Units by mouth daily.     . furosemide (LASIX) 20 MG tablet Take 10 mg by mouth every other day.    . levothyroxine (SYNTHROID, LEVOTHROID) 25 MCG tablet Take 12.5 mcg by mouth daily. BEFORE BREAKFAST WITH THE 50MCG AS WELL EACH MORNING BY MOUTH A TOTAL OF 62.5MCG EACH AM WITH BREAKFAST  2  . levothyroxine (SYNTHROID, LEVOTHROID) 50 MCG tablet Take 50 mcg by mouth daily.  0  . pravastatin (PRAVACHOL) 20 MG tablet Take 20 mg by mouth every evening.     .  sertraline (ZOLOFT) 25 MG tablet Take 25 mg by mouth every morning.     [name] albuterol (PROVENTIL HFA;VENTOLIN HFA) 108 (90 Base) MCG/ACT inhaler 2 puffs inhaled every 4-6 hours as needed for shortness of breath (Patient not taking: Reported on 01/22/2016) 1 Inhaler 1   No current facility-administered medications for this visit.     Allergies:   Patient has no known allergies.    Social History:  The patient  reports that [name] has never smoked. [name] has never used smokeless tobacco. [name] reports that [name] does not drink alcohol or use drugs.   Family History:  The patient's family history includes Cancer in her brother; Emphysema in her father; Heart attack  in her brother; Heart failure in her mother; Stroke in her brother.    ROS:  Please see the history of present illness.   Otherwise, review of systems are positive for none.   All other systems are reviewed and negative.    PHYSICAL EXAM: VS:  BP 124/80 (BP Location: Left Arm, Patient Position: Sitting, Cuff Size: Normal)   Pulse 80   Ht 5\' 5"  (1.651 m)   Wt 151 lb 12.8 oz (68.9 kg)   SpO2 99%   BMI 25.26 kg/m  , BMI Body mass index Diane 25.26 kg/m. GEN: Well nourished, well developed, in no acute distress  HEENT: normal  Neck: no JVD, soft  left carotid bruit, or masses Cardiac: RRR;  Frequent PVCs. [id number] systolic  murmurs, rubs, or gallops, no edema  Respiratory:  clear to auscultation bilaterally, normal work of breathing GI: soft, nontender, nondistended, + BS Diane: no deformity or atrophy  Skin: warm and dry, no rash Neuro:  Strength and sensation are intact Psych: euthymic mood, full affect   EKG:  EKG Diane ordered today. The ekg ordered today demonstrates ECG from the hospital reveal NSR with PACs   Recent Labs: 09/27/2015: Hemoglobin 12.9; Platelets 318; TSH 3.092 10/18/2015: Brain Natriuretic Peptide 49.9; BUN 18; Creat 0.79; Magnesium 2.4; Potassium 4.7; Sodium 142    Lipid Panel    Component Value Date/Time   CHOL 181  10/29/2011 0829   TRIG 118 10/29/2011 0829   HDL 55 10/29/2011 0829   CHOLHDL 3.3 10/29/2011 0829   VLDL 24 10/29/2011 0829   LDLCALC 102 (H) 10/29/2011 0829      Wt Readings from Last 3 Encounters:  01/22/16 151 lb 12.8 oz (68.9 kg)  10/29/15 150 lb 1.9 oz (68.1 kg)  10/18/15 152 lb (68.9 kg)      Other studies Reviewed: Additional studies/ records that were reviewed today include: records and labs from medical . Review of the above records demonstrates: normal creatinine and potassium    ASSESSMENT AND PLAN:  1. Hypertension- her blood pressure Diane well-controlled. 2. Hyperlipidemia - Gen. labs drawn by her medical doctor.  Look fine. 3. Hypothyroidism- followed by her primary medical doctor. 4. Chronic diastolic dysfunction.  - Her chronic diastolic CHF seems to be well-controlled. Continue current dose of Lasix. Her renal function and potassium are within normal limits.  [name] does have some shortness breath with exertion that seems to be getting along fairly well. I have given her a handicap parking placard.  5. Left carotid bruit:  Diane asymptomatic   6.   Dizziness:   Was found to have PVCs and was started on Amiodarone by Dr. Curt Bears .  We'll reduce her dose to 200 mg on Mondays, Wednesdays, Fridays.   I'll see her again in 6 months for follow-up visit.    Current medicines are reviewed at length with the patient today.  The patient does not have concerns regarding medicines.  The following changes have been made:  no change  Labs/ tests ordered today include:  No orders of the defined types were placed in this encounter.    Disposition:   FU with me  in 6 months   Signed, Mertie Moores, MD  01/22/2016 11:38 AM    [facility] [place], [place], [place]  [address] Phone: [phone]; Fax: [phone]

## 2016-01-30 ENCOUNTER — Ambulatory Visit: Payer: Medicare Other | Admitting: Cardiology

## 2016-02-15 ENCOUNTER — Ambulatory Visit: Payer: Medicare Other | Admitting: Cardiology

## 2016-02-19 ENCOUNTER — Encounter: Payer: Self-pay | Admitting: Cardiology

## 2016-02-19 ENCOUNTER — Ambulatory Visit (INDEPENDENT_AMBULATORY_CARE_PROVIDER_SITE_OTHER): Payer: Medicare Other | Admitting: Cardiology

## 2016-02-19 VITALS — BP 126/72 | HR 85 | Ht 65.0 in | Wt 153.0 lb

## 2016-02-19 DIAGNOSIS — I493 Ventricular premature depolarization: Secondary | ICD-10-CM | POA: Diagnosis not present

## 2016-02-19 NOTE — Progress Notes (Addendum)
Electrophysiology Office Note   Date:  02/19/2016   ID:  [name], [name] 19-Aug-1921, MRN [id number]  PCP:  [name], MD  Cardiologist:  [name] Primary Electrophysiologist:  Lawsen Arnott [name], MD    Chief Complaint  Patient presents with  . Follow-up    PVC's     History of Present Illness: [name] is a 83 y.o. female who presents today for electrophysiology evaluation.  History of HTN, hyperlipidemia, hypothyroidism, mild AS by [id number], chronic diastolic CHF, hypertensive heart disease, frequent PVCs, and possible prior tachy-brady syndrome (although not clear if PVCs were causing perception of bradycardia).   She has history of near-syncope/dizziness in 2014 - was diagnosed with vertigo. Holter 08/2012: min HR 59, max 100, mean 76, longest pause 1.63 sec, 1651 PVCs in 24 hours (1.5% of total QRS). Event monitor 09/2012: NSR, freq PVCs. Normal nuc 2010. 2D echo 08/2013: mild LVH, EF 55-60%, grade 1 DD, mild AS, calcified mitral annulus, mild LAE. Per review of chart, she was admitted to the [facility] 09/2013 with acute on chronic diastolic CHF, frequent PVCs (contributing to a low effective HR by count), and UTI. She was seen in the ER in 09/2015 complaining of lack of energy, exertional DOE, and chronic dizziness. Her caregiver had felt her pulse and noted it was in the 40s so she went to her PCP at which time she was sent to the ER for concern for ventricular bigeminy and possible bradycardia. The office note from her PCP visit cites a HR of 45 but EKG from same visit showed HR 88. While in the ER, HR was in the 80s, frequent PVCs on telemetry.   Today, she denies symptoms of palpitations, chest pain, shortness of breath orthopnea, PND, lower extremity edema, claudication, dizziness, presyncope, syncope, bleeding, or neurologic sequela. The patient is tolerating medications without difficulties.   She was started on amiodarone at the last visit. Her dose of amiodarone was  decreased to 200 mg every other day. She is tolerating this well, not having any further episodes of palpitations, shortness of breath, or fatigue.   Past Medical History:  Diagnosis Date  . Anxiety   . Aortic stenosis    a. mild by echo 2017.  . Arthritis   . Bradycardia    a. ? details not clear - in the past has had PVCs which have lowered effective HR count by pulse check.  . Chronic diastolic CHF (congestive heart failure) ([place])   . Diastolic dysfunction    mild   . Frequent PVCs    a. 48 Hour Holter 10/08/15 showed NSR and sinus tach with frequent PVCs, occasional bigeminy, total PVC burden 16.5%, lowest HR 59.  [name] Hyperlipidemia   . Hypertension   . Hypertensive heart disease   . Hypothyroidism   . Kidney stones   . Mild mitral regurgitation by prior echocardiogram   . Tachycardia    history of tachycardia and bradycardia   Past Surgical History:  Procedure Laterality Date  . APPENDECTOMY    . CHOLECYSTECTOMY    . EXTRACORPOREAL SHOCK WAVE LITHOTRIPSY    . EYE SURGERY Bilateral    cataract surgery with lens implant  . ORIF HUMERUS FRACTURE Right 10/31/2013   Procedure: Open Reduction Internal Fixation Right Proximal Humerus Fracture;  Surgeon: Marybelle Killings, MD;  Location: [facility];  Service: Orthopedics;  Laterality: Right;  . TONSILLECTOMY    . TOTAL ABDOMINAL HYSTERECTOMY       Current Outpatient Prescriptions  Medication Sig Dispense Refill  . acetaminophen (TYLENOL) 325 MG tablet Take 325 mg by mouth 2 (two) times daily as needed for mild pain.     [name] ALPRAZolam (XANAX) 0.25 MG tablet Take 0.125 mg by mouth daily as needed for anxiety.     [name] amiodarone (PACERONE) 200 MG tablet Take 1 tablet (200 mg total) by mouth 3 (three) times a week. on Mondays, Wednesdays, and Fridays only    . amLODipine (NORVASC) 5 MG tablet Take 1 tablet (5 mg total) by mouth daily. 30 tablet 0  . cholecalciferol (VITAMIN D) 1000 units tablet Take 2,000 Units by mouth daily.     . furosemide  (LASIX) 20 MG tablet Take 10 mg by mouth every other day.    . levothyroxine (SYNTHROID, LEVOTHROID) 25 MCG tablet Take 12.5 mcg by mouth daily. BEFORE BREAKFAST WITH THE 50MCG AS WELL EACH MORNING BY MOUTH A TOTAL OF 62.5MCG EACH AM WITH BREAKFAST  2  . levothyroxine (SYNTHROID, LEVOTHROID) 50 MCG tablet Take 50 mcg by mouth daily.  0  . pravastatin (PRAVACHOL) 20 MG tablet Take 20 mg by mouth every evening.     . sertraline (ZOLOFT) 25 MG tablet Take 25 mg by mouth every morning.      No current facility-administered medications for this visit.     Allergies:   Patient has no known allergies.   Social History:  The patient  reports that she has never smoked. She has never used smokeless tobacco. She reports that she does not drink alcohol or use drugs.   Family History:  The patient's family history includes Cancer in her brother; Emphysema in her father; Heart attack in her brother; Heart failure in her mother; Stroke in her brother.    ROS:  Please see the history of present illness.   Otherwise, review of systems is positive for none.   All other systems are reviewed and negative.    PHYSICAL EXAM: VS:  BP 126/72   Pulse 85   Ht 5\' 5"  (1.651 m)   Wt 153 lb (69.4 kg)   BMI 25.46 kg/m  , BMI Body mass index is 25.46 kg/m. GEN: Well nourished, well developed, in no acute distress  HEENT: normal  Neck: no JVD, carotid bruits, or masses Cardiac: RRR; no murmurs, rubs, or gallops,no edema  Respiratory:  clear to auscultation bilaterally, normal work of breathing GI: soft, nontender, nondistended, + BS MS: no deformity or atrophy  Skin: warm and dry Neuro:  Strength and sensation are intact Psych: euthymic mood, full affect  EKG:  EKG is ordered today. Personal review of the ekg ordered shows sinus rhythm, rate 85  Recent Labs: 09/27/2015: Hemoglobin 12.9; Platelets 318; TSH 3.092 10/18/2015: Brain Natriuretic Peptide 49.9; BUN 18; Creat 0.79; Magnesium 2.4; Potassium 4.7;  Sodium 142    Lipid Panel     Component Value Date/Time   CHOL 181 10/29/2011 0829   TRIG 118 10/29/2011 0829   HDL 55 10/29/2011 0829   CHOLHDL 3.3 10/29/2011 0829   VLDL 24 10/29/2011 0829   LDLCALC 102 (H) 10/29/2011 0829     Wt Readings from Last 3 Encounters:  02/19/16 153 lb (69.4 kg)  01/22/16 151 lb 12.8 oz (68.9 kg)  10/29/15 150 lb 1.9 oz (68.1 kg)      Other studies Reviewed: Additional studies/ records that were reviewed today include: TTE 10/05/15  Review of the above records today demonstrates:   - Left ventricle: The cavity size was normal. Systolic  function was   normal. The estimated ejection fraction was in the range of 55%   to 60%. Wall motion was normal; there were no regional wall   motion abnormalities. Features are consistent with a pseudonormal   left ventricular filling pattern, with concomitant abnormal   relaxation and increased filling pressure (grade 2 diastolic   dysfunction). Doppler parameters are consistent with high   ventricular filling pressure. - Aortic valve: Moderately calcified annulus. Trileaflet; normal   thickness, mildly calcified leaflets. There was mild stenosis.   There was mild regurgitation. Valve area (VTI): 1.12 cm^2. Valve   area (Vmax): 1.09 cm^2. Valve area (Vmean): 1.04 cm^2. - Mitral valve: Calcified annulus. There was mild regurgitation. - Right atrium: The atrium was mildly dilated. - Atrial septum: There was increased thickness of the septum,   consistent with lipomatous hypertrophy. - Pulmonic valve: There was mild regurgitation. - Pulmonary arteries: PA peak pressure: 33 mm Hg (S).  Holter 10/08/15 NSR and sinus tach with frequent PVCs with occasional ventricular bigeminy. Total PVC burden = 16.5%. Clinical correlation recommended.    ASSESSMENT AND PLAN:  1.  PVCs: Was started on amiodarone and her last visit and tolerating the medication well. She is now taking 200 mg every other day, with quite a bit  fewer PVCs. She is feeling much better on this dose. We'll continue her on amiodarone. She Manley Fason likely need a TSH and LFTs at her next visit.  2. Chronic diastolic heart failure: Currently well compensated.   3. Hypertension: well controlled  Current medicines are reviewed at length with the patient today.   The patient does not have concerns regarding her medicines.  The following changes were made today:  none  Labs/ tests ordered today include:  Orders Placed This Encounter  Procedures  . EKG 12-Lead     Disposition:   FU with Taylon Coole 6 months  Signed, Roniya Tetro [name], MD  02/19/2016 4:09 PM     [address] [place] [address] [phone] (office) [phone] (fax)

## 2016-02-19 NOTE — Patient Instructions (Addendum)
Medication Instructions:  Your physician recommends that you continue on your current medications as directed. Please refer to the Current Medication list given to you today.  If you need a refill on your cardiac medications before your next appointment, please call your pharmacy.   Labwork: None ordered  Testing/Procedures: None ordered  Follow-Up: Your physician wants you to follow-up in: 6 months with Dr. Camnitz.  You will receive a reminder letter in the mail two months in advance. If you don't receive a letter, please call our office to schedule the follow-up appointment.  Thank you for choosing [facility]!!   Azya Barbero, RN [phone]         

## 2016-03-03 ENCOUNTER — Emergency Department (HOSPITAL_COMMUNITY)
Admission: EM | Admit: 2016-03-03 | Discharge: 2016-03-03 | Disposition: A | Discharge status: Home / Self Care | Payer: Medicare Other | Attending: Emergency Medicine | Admitting: Emergency Medicine

## 2016-03-03 ENCOUNTER — Emergency Department (HOSPITAL_COMMUNITY): Payer: Medicare Other

## 2016-03-03 ENCOUNTER — Encounter (HOSPITAL_COMMUNITY): Payer: Self-pay

## 2016-03-03 DIAGNOSIS — I5033 Acute on chronic diastolic (congestive) heart failure: Secondary | ICD-10-CM | POA: Diagnosis not present

## 2016-03-03 DIAGNOSIS — I110 Hypertensive heart disease with heart failure: Secondary | ICD-10-CM | POA: Insufficient documentation

## 2016-03-03 DIAGNOSIS — K529 Noninfective gastroenteritis and colitis, unspecified: Secondary | ICD-10-CM | POA: Diagnosis not present

## 2016-03-03 DIAGNOSIS — Z79899 Other long term (current) drug therapy: Secondary | ICD-10-CM | POA: Diagnosis not present

## 2016-03-03 DIAGNOSIS — E039 Hypothyroidism, unspecified: Secondary | ICD-10-CM | POA: Insufficient documentation

## 2016-03-03 DIAGNOSIS — R1030 Lower abdominal pain, unspecified: Secondary | ICD-10-CM | POA: Diagnosis not present

## 2016-03-03 DIAGNOSIS — R109 Unspecified abdominal pain: Secondary | ICD-10-CM | POA: Diagnosis not present

## 2016-03-03 LAB — CBC
HCT: 39.7 % (ref 36.0–46.0)
HEMOGLOBIN: 13.2 g/dL (ref 12.0–15.0)
MCH: 31.1 pg (ref 26.0–34.0)
MCHC: 33.2 g/dL (ref 30.0–36.0)
MCV: 93.6 fL (ref 78.0–100.0)
Platelets: 416 10*3/uL — ABNORMAL HIGH (ref 150–400)
RBC: 4.24 MIL/uL (ref 3.87–5.11)
RDW: 14.1 % (ref 11.5–15.5)
WBC: 16.2 10*3/uL — ABNORMAL HIGH (ref 4.0–10.5)

## 2016-03-03 LAB — COMPREHENSIVE METABOLIC PANEL
ALBUMIN: 3.8 g/dL (ref 3.5–5.0)
ALK PHOS: 92 U/L (ref 38–126)
ALT: 16 U/L (ref 14–54)
AST: 18 U/L (ref 15–41)
Anion gap: 9 (ref 5–15)
BILIRUBIN TOTAL: 0.9 mg/dL (ref 0.3–1.2)
BUN: 15 mg/dL (ref 6–20)
CALCIUM: 9 mg/dL (ref 8.9–10.3)
CO2: 27 mmol/L (ref 22–32)
Chloride: 104 mmol/L (ref 101–111)
Creatinine, Ser: 0.81 mg/dL (ref 0.44–1.00)
GFR calc Af Amer: >60 mL/min (ref >60)
GFR calc non Af Amer: >60 mL/min (ref >60)
GLUCOSE: 101 mg/dL — AB (ref 65–99)
Potassium: 4.3 mmol/L (ref 3.5–5.1)
SODIUM: 140 mmol/L (ref 135–145)
TOTAL PROTEIN: 7.9 g/dL (ref 6.5–8.1)

## 2016-03-03 LAB — URINALYSIS, ROUTINE W REFLEX MICROSCOPIC
Bilirubin Urine: NEGATIVE
GLUCOSE, UA: NEGATIVE mg/dL
Hgb urine dipstick: NEGATIVE
KETONES UR: 5 mg/dL — AB
NITRITE: NEGATIVE
PROTEIN: 30 mg/dL — AB
Specific Gravity, Urine: 1.023 (ref 1.005–1.030)
pH: 5 (ref 5.0–8.0)

## 2016-03-03 LAB — LIPASE, BLOOD: Lipase: 21 U/L (ref 11–51)

## 2016-03-03 MED ORDER — AMOXICILLIN-POT CLAVULANATE 875-125 MG PO TABS: 1.0000 | ORAL_TABLET | Freq: Two times a day (BID) | ORAL | 0 refills | Status: AC

## 2016-03-03 MED ORDER — SODIUM CHLORIDE 0.9 % IJ SOLN
INTRAMUSCULAR | Status: AC
  Filled 2016-03-03: qty 50

## 2016-03-03 MED ORDER — FENTANYL CITRATE (PF) 100 MCG/2ML IJ SOLN
50.0000 ug | Freq: Once | INTRAMUSCULAR | Status: AC
  Administered 2016-03-03: 50 ug via INTRAVENOUS
  Filled 2016-03-03: qty 2

## 2016-03-03 MED ORDER — IOPAMIDOL (ISOVUE-300) INJECTION 61%
INTRAVENOUS | Status: AC
  Filled 2016-03-03: qty 100

## 2016-03-03 MED ORDER — AMOXICILLIN-POT CLAVULANATE 875-125 MG PO TABS
1.0000 | ORAL_TABLET | Freq: Once | ORAL | Status: AC
  Administered 2016-03-03: 1 via ORAL
  Filled 2016-03-03: qty 1

## 2016-03-03 MED ORDER — ACETAMINOPHEN 500 MG PO TABS
1000.0000 mg | ORAL_TABLET | Freq: Once | ORAL | Status: AC
  Administered 2016-03-03: 1000 mg via ORAL
  Filled 2016-03-03: qty 2

## 2016-03-03 MED ORDER — SODIUM CHLORIDE 0.9 % IV BOLUS (SEPSIS)
500.0000 mL | Freq: Once | INTRAVENOUS | Status: AC
  Administered 2016-03-03: 500 mL via INTRAVENOUS

## 2016-03-03 MED ORDER — IOPAMIDOL (ISOVUE-300) INJECTION 61%
100.0000 mL | Freq: Once | INTRAVENOUS | Status: AC | PRN
  Administered 2016-03-03: 100 mL via INTRAVENOUS

## 2016-03-03 NOTE — ED Provider Notes (Signed)
[facility] DEPT Provider Note   CSN: [id number] Arrival date & time: 03/03/16  1524     History   Chief Complaint Chief Complaint  Patient presents with  . Abdominal Pain    HPI [name] is a 83 y.o. female.   Abdominal Pain   This is a new problem. Episode onset: 3 days. The problem occurs constantly. The problem has not changed since onset.The pain is located in the suprapubic region. The quality of the pain is dull and sharp. The pain is moderate. Associated symptoms include frequency. Pertinent negatives include fever, diarrhea, melena, nausea, vomiting, dysuria and hematuria. Associated symptoms comments: Urine odor change. The symptoms are aggravated by certain positions. The symptoms are relieved by certain positions and palpation.   Still having BM and passing gas.  Past Medical History:  Diagnosis Date  . Anxiety   . Aortic stenosis    a. mild by echo 2017.  . Arthritis   . Bradycardia    a. ? details not clear - in the past has had PVCs which have lowered effective HR count by pulse check.  . Chronic diastolic CHF (congestive heart failure) ([place])   . Diastolic dysfunction    mild   . Frequent PVCs    a. 48 Hour Holter 10/08/15 showed NSR and sinus tach with frequent PVCs, occasional bigeminy, total PVC burden 16.5%, lowest HR 59.  [name] Hyperlipidemia   . Hypertension   . Hypertensive heart disease   . Hypothyroidism   . Kidney stones   . Mild mitral regurgitation by prior echocardiogram   . Tachycardia    history of tachycardia and bradycardia    Patient Active Problem List   Diagnosis Date Noted  . Frequent PVCs   . Bradycardia   . Hypertensive heart disease   . Hyperlipidemia 02/02/2014  . Fracture of humerus, proximal, right, closed 10/31/2013  . Proximal humerus fracture 10/31/2013  . UTI (urinary tract infection) 09/24/2013  . Acute on chronic diastolic congestive heart failure ([place]) 09/24/2013  . Abnormal EKG 09/24/2013  . Acute on chronic  diastolic heart failure ([place]) 09/24/2013  . Infection of left eye 09/24/2013  . Near syncope 09/24/2012  . Hypothyroidism 10/30/2011  . Chest pain 10/29/2011  . Chronic diastolic CHF (congestive heart failure) ([place]) 07/05/2010  . Nonspecific (abnormal) findings on radiological and other examination of body structure 11/12/2009  . CT, CHEST, ABNORMAL 11/12/2009  . DYSPNEA ON EXERTION 06/05/2009    Past Surgical History:  Procedure Laterality Date  . APPENDECTOMY    . CHOLECYSTECTOMY    . EXTRACORPOREAL SHOCK WAVE LITHOTRIPSY    . EYE SURGERY Bilateral    cataract surgery with lens implant  . ORIF HUMERUS FRACTURE Right 10/31/2013   Procedure: Open Reduction Internal Fixation Right Proximal Humerus Fracture;  Surgeon: Marybelle Killings, MD;  Location: [facility];  Service: Orthopedics;  Laterality: Right;  . TONSILLECTOMY    . TOTAL ABDOMINAL HYSTERECTOMY      OB History    No data available       Home Medications    Prior to Admission medications   Medication Sig Start Date End Date Taking? Authorizing Provider  acetaminophen (TYLENOL) 325 MG tablet Take 325 mg by mouth 2 (two) times daily as needed for mild pain, moderate pain or headache.    Yes Historical Provider, MD  ALPRAZolam Duanne Moron) 0.25 MG tablet Take 0.125 mg by mouth daily as needed for anxiety.    Yes Historical Provider, MD  amiodarone (PACERONE)  200 MG tablet Take 200 mg by mouth every Monday, Wednesday, and Friday.   Yes Historical Provider, MD  amLODipine (NORVASC) 5 MG tablet Take 1 tablet (5 mg total) by mouth daily. 03/30/15  Yes Thayer Headings, MD  cholecalciferol (VITAMIN D) 1000 units tablet Take 2,000 Units by mouth daily.    Yes Historical Provider, MD  furosemide (LASIX) 20 MG tablet Take 30 mg by mouth daily.    Yes Historical Provider, MD  levothyroxine (SYNTHROID, LEVOTHROID) 25 MCG tablet Take 12.5 mcg by mouth daily before breakfast. Pt takes with a 23mcg tablet.   Yes Historical Provider, MD  levothyroxine  (SYNTHROID, LEVOTHROID) 50 MCG tablet Take 50 mcg by mouth daily. Pt takes with one-half of a 17mcg tablet.   Yes Historical Provider, MD  Multiple Vitamins-Minerals (PRESERVISION AREDS 2+MULTI VIT) CAPS Take 1 capsule by mouth 2 (two) times daily.   Yes Historical Provider, MD  pravastatin (PRAVACHOL) 20 MG tablet Take 20 mg by mouth at bedtime.    Yes Historical Provider, MD  sertraline (ZOLOFT) 25 MG tablet Take 25 mg by mouth daily.    Yes Historical Provider, MD  amoxicillin-clavulanate (AUGMENTIN) 875-125 MG tablet Take 1 tablet by mouth every 12 (twelve) hours. 03/03/16 03/10/16  Fatima Blank, MD    Family History Family History  Problem Relation Age of Onset  . Emphysema Father   . Heart failure Mother   . Heart attack Brother   . Cancer Brother     kidney  . Stroke Brother     Social History Social History  Substance Use Topics  . Smoking status: Never Smoker  . Smokeless tobacco: Never Used  . Alcohol use No     Allergies   Patient has no known allergies.   Review of Systems Review of Systems  Constitutional: Negative for fever.  Gastrointestinal: Positive for abdominal pain. Negative for diarrhea, melena, nausea and vomiting.  Genitourinary: Positive for frequency. Negative for dysuria and hematuria.  Ten systems are reviewed and are negative for acute change except as noted in the HPI    Physical Exam Updated Vital Signs BP 136/58   Pulse 108   Temp 97.7 F (36.5 C) (Oral)   Resp 15   Ht 5\' 5"  (1.651 m)   Wt 151 lb (68.5 kg)   SpO2 100%   BMI 25.13 kg/m   Physical Exam  Constitutional: She is oriented to person, place, and time. She appears well-developed and well-nourished. No distress.  HENT:  Head: Normocephalic and atraumatic.  Nose: Nose normal.  Eyes: Conjunctivae and EOM are normal. Pupils are equal, round, and reactive to light. Right eye exhibits no discharge. Left eye exhibits no discharge. No scleral icterus.  Neck: Normal range  of motion. Neck supple.  Cardiovascular: Normal rate and regular rhythm.  Exam reveals no gallop and no friction rub.   No murmur heard. Pulmonary/Chest: Effort normal and breath sounds normal. No stridor. No respiratory distress. She has no rales.  Abdominal: Soft. She exhibits no distension. There is tenderness in the right lower quadrant, suprapubic area and left lower quadrant. There is no rigidity, no rebound, no guarding and no CVA tenderness.  Musculoskeletal: She exhibits no edema or tenderness.  Neurological: She is alert and oriented to person, place, and time.  Skin: Skin is warm and dry. No rash noted. She is not diaphoretic. No erythema.  Psychiatric: She has a normal mood and affect.  Vitals reviewed.    ED Treatments / Results  Labs (all labs ordered are listed, but only abnormal results are displayed) Labs Reviewed  COMPREHENSIVE METABOLIC PANEL - Abnormal; Notable for the following:       Result Value   Glucose, Bld 101 (*)    All other components within normal limits  CBC - Abnormal; Notable for the following:    WBC 16.2 (*)    Platelets 416 (*)    All other components within normal limits  URINALYSIS, ROUTINE W REFLEX MICROSCOPIC - Abnormal; Notable for the following:    Color, Urine AMBER (*)    APPearance HAZY (*)    Ketones, ur 5 (*)    Protein, ur 30 (*)    Leukocytes, UA LARGE (*)    Bacteria, UA RARE (*)    Squamous Epithelial / LPF 6-30 (*)    All other components within normal limits  LIPASE, BLOOD    EKG  EKG Interpretation None       Radiology Ct Abdomen Pelvis W Contrast  Result Date: 03/03/2016 CLINICAL DATA:  Acute onset of lower abdominal pain and increased urinary frequency. Leukocytosis. Initial encounter. EXAM: CT ABDOMEN AND PELVIS WITH CONTRAST TECHNIQUE: Multidetector CT imaging of the abdomen and pelvis was performed using the standard protocol following bolus administration of intravenous contrast. CONTRAST:  122mL ISOVUE-300  IOPAMIDOL (ISOVUE-300) INJECTION 61% COMPARISON:  Retroperitoneal ultrasound performed 10/03/2009 FINDINGS: Lower chest: Scattered peripheral scarring is noted at the lung bases. Calcification is seen about the mitral valve. Hepatobiliary: The liver is unremarkable in appearance. The patient is status post cholecystectomy, with clips noted at the gallbladder fossa. The common bile duct remains normal in caliber, status post cholecystectomy. Pancreas: The pancreas is within normal limits. Spleen: The spleen is unremarkable in appearance. Adrenals/Urinary Tract: The adrenal glands are grossly unremarkable. A large 2.4 cm stone is noted at the left renal pelvis, with mild surrounding soft tissue inflammation. There is no evidence of hydronephrosis. Additional stones are noted at the lower pole of the left kidney. There is mild left renal atrophy. Prominent left renal parapelvic cysts are seen. Small right renal cysts are seen. The right kidney is otherwise unremarkable. No perinephric stranding is seen. No obstructing ureteral stones are identified. Stomach/Bowel: The stomach is unremarkable in appearance. The patient is status post appendectomy. Segmental wall thickening is noted along the distal ileum, at the upper pelvis, with mild associated soft tissue inflammation. This may reflect infectious or inflammatory ileitis. Mild soft tissue stranding tracks along the mesentery, with a mildly prominent 7 mm node. The remaining small bowel is grossly unremarkable. There is sparing of the terminal ileum. Scattered diverticulosis is noted along the sigmoid colon, without evidence of diverticulitis. Vascular/Lymphatic: Scattered calcification is seen along the abdominal aorta and its branches. The abdominal aorta is otherwise grossly unremarkable. The inferior vena cava is grossly unremarkable. No retroperitoneal lymphadenopathy is seen. No pelvic sidewall lymphadenopathy is identified. Reproductive: The bladder is  decompressed and not well assessed. The patient is status post hysterectomy. No suspicious adnexal masses are seen. Other: No additional soft tissue abnormalities are seen. Musculoskeletal: No acute osseous abnormalities are identified. The visualized musculature is unremarkable in appearance. IMPRESSION: 1. Segmental wall thickening along the distal ileum, at the upper pelvis, with mild associated soft tissue inflammation. This may reflect infectious or inflammatory ileitis. Mild soft tissue stranding tracks along the mesentery, with a mildly prominent 7 mm node. There is sparing of the terminal ileum. 2. Large 2.4 cm left renal pelvis stone, with mild surrounding soft tissue inflammation.  No evidence of hydronephrosis. Additional left renal stones noted, with mild left renal atrophy. 3. Bilateral renal cysts, more prominent on the left. 4. Calcification about the mitral valve. 5. Scattered diverticulosis along the sigmoid colon, without evidence of diverticulitis. 6. Scattered aortic atherosclerosis. Electronically Signed   By: Garald Balding M.D.   On: 03/03/2016 19:26    Procedures Procedures (including critical care time)  Medications Ordered in ED Medications  iopamidol (ISOVUE-300) 61 % injection (not administered)  sodium chloride 0.9 % injection (not administered)  amoxicillin-clavulanate (AUGMENTIN) 875-125 MG per tablet 1 tablet (not administered)  sodium chloride 0.9 % bolus 500 mL (0 mLs Intravenous Stopped 03/03/16 2003)  acetaminophen (TYLENOL) tablet 1,000 mg (1,000 mg Oral Given 03/03/16 1817)  fentaNYL (SUBLIMAZE) injection 50 mcg (50 mcg Intravenous Given 03/03/16 1818)  iopamidol (ISOVUE-300) 61 % injection 100 mL (100 mLs Intravenous Contrast Given 03/03/16 1849)     Initial Impression / Assessment and Plan / ED Course  I have reviewed the triage vital signs and the nursing notes.  Pertinent labs & imaging results that were available during my care of the patient were reviewed  by me and considered in my medical decision making (see chart for details).  Clinical Course as of Mar 04 2035  Mon Mar 03, 2016  1804 Presentation was suspicious for urinary tract infection however UA was not convincing. Given the lower abdominal pain with leukocytosis, we'll obtain a CT to rule out appendicitis, diverticulitis, or other intra-abdominal inflammatory/infectious process. Patient provided with pain medicine and IV fluids.  [PC]  1959 CT of the abdomen revealed evidence of ileitis. Likely infectious in nature. Patient has been able to tolerate by mouth intake here. We'll treat with antibiotics and have patient follow up closely with primary care provider for reassessment.The patient is safe for discharge with strict return precautions.   [PC]    Clinical Course User Index [PC] Fatima Blank, MD      Final Clinical Impressions(s) / ED Diagnoses   Final diagnoses:  Ileitis   Disposition: Discharge  Condition: Good  I have discussed the results, Dx and Tx plan with the patient who expressed understanding and agree(s) with the plan. Discharge instructions discussed at great length. The patient was given strict return precautions who verbalized understanding of the instructions. No further questions at time of discharge.    New Prescriptions   AMOXICILLIN-CLAVULANATE (AUGMENTIN) 875-125 MG TABLET    Take 1 tablet by mouth every 12 (twelve) hours.    Follow Up: Leighton Ruff, MD [place] [id number] [phone]  In 3 days For close follow up to assess for ileitis      Fatima Blank, MD 03/03/16 2037

## 2016-03-03 NOTE — ED Triage Notes (Signed)
PT SENT FROM [facility] FOR ABNORMAL LAB WORK. PT ORIGINALLY WAS SEEN TODAY FOR LOWER ABDOMINAL PAIN AND URINARY FREQUENCY/URGENCY SINCE Friday. SHE HAD LABS AND URINE TESTED. THEY WANTED HER TO BE EVALUATED FOR A WBC OF 16.2. PT DENIES FEVER, N/V.

## 2016-03-06 DIAGNOSIS — K529 Noninfective gastroenteritis and colitis, unspecified: Secondary | ICD-10-CM | POA: Diagnosis not present

## 2016-03-06 DIAGNOSIS — E039 Hypothyroidism, unspecified: Secondary | ICD-10-CM | POA: Diagnosis not present

## 2016-03-17 DIAGNOSIS — K529 Noninfective gastroenteritis and colitis, unspecified: Secondary | ICD-10-CM | POA: Diagnosis not present

## 2016-03-17 DIAGNOSIS — R140 Abdominal distension (gaseous): Secondary | ICD-10-CM | POA: Diagnosis not present

## 2016-03-17 DIAGNOSIS — R194 Change in bowel habit: Secondary | ICD-10-CM | POA: Diagnosis not present

## 2016-03-18 ENCOUNTER — Other Ambulatory Visit: Payer: Self-pay | Admitting: Cardiology

## 2016-05-09 DIAGNOSIS — R0600 Dyspnea, unspecified: Secondary | ICD-10-CM | POA: Diagnosis not present

## 2016-05-09 DIAGNOSIS — L509 Urticaria, unspecified: Secondary | ICD-10-CM | POA: Diagnosis not present

## 2016-05-09 DIAGNOSIS — J069 Acute upper respiratory infection, unspecified: Secondary | ICD-10-CM | POA: Diagnosis not present

## 2016-05-28 DIAGNOSIS — N3946 Mixed incontinence: Secondary | ICD-10-CM | POA: Diagnosis not present

## 2016-05-28 DIAGNOSIS — N200 Calculus of kidney: Secondary | ICD-10-CM | POA: Diagnosis not present

## 2016-06-10 DIAGNOSIS — E039 Hypothyroidism, unspecified: Secondary | ICD-10-CM | POA: Diagnosis not present

## 2016-07-21 IMAGING — CR DG CHEST 2V
2 series · 2 of 2 positions shown · non-contrast
Comparison: 09/02/2013.

CLINICAL DATA: Irregular EKG.  Hypertension.  Chest pain.

EXAM:
CHEST  2 VIEW

[w chest pa]
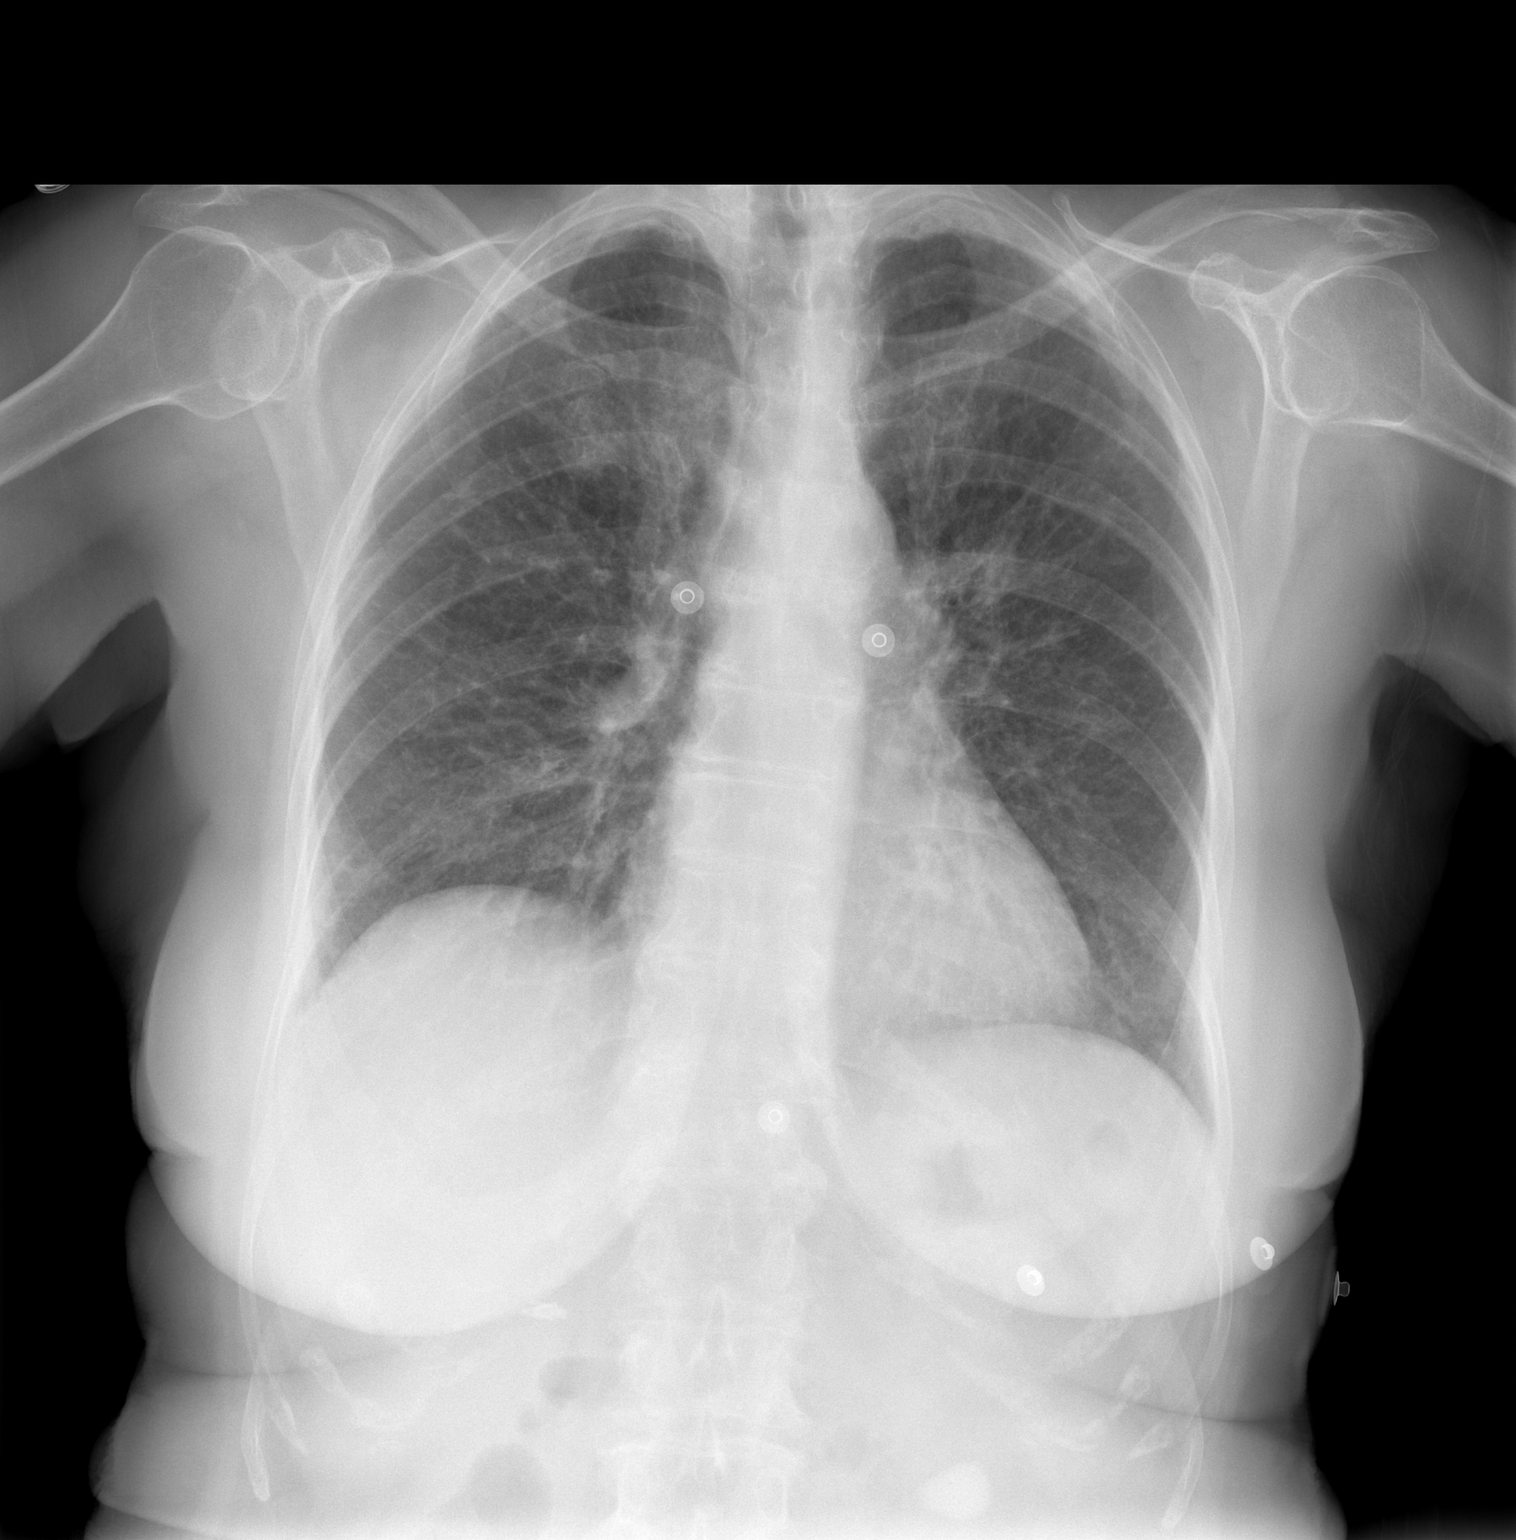

[w chest lat]
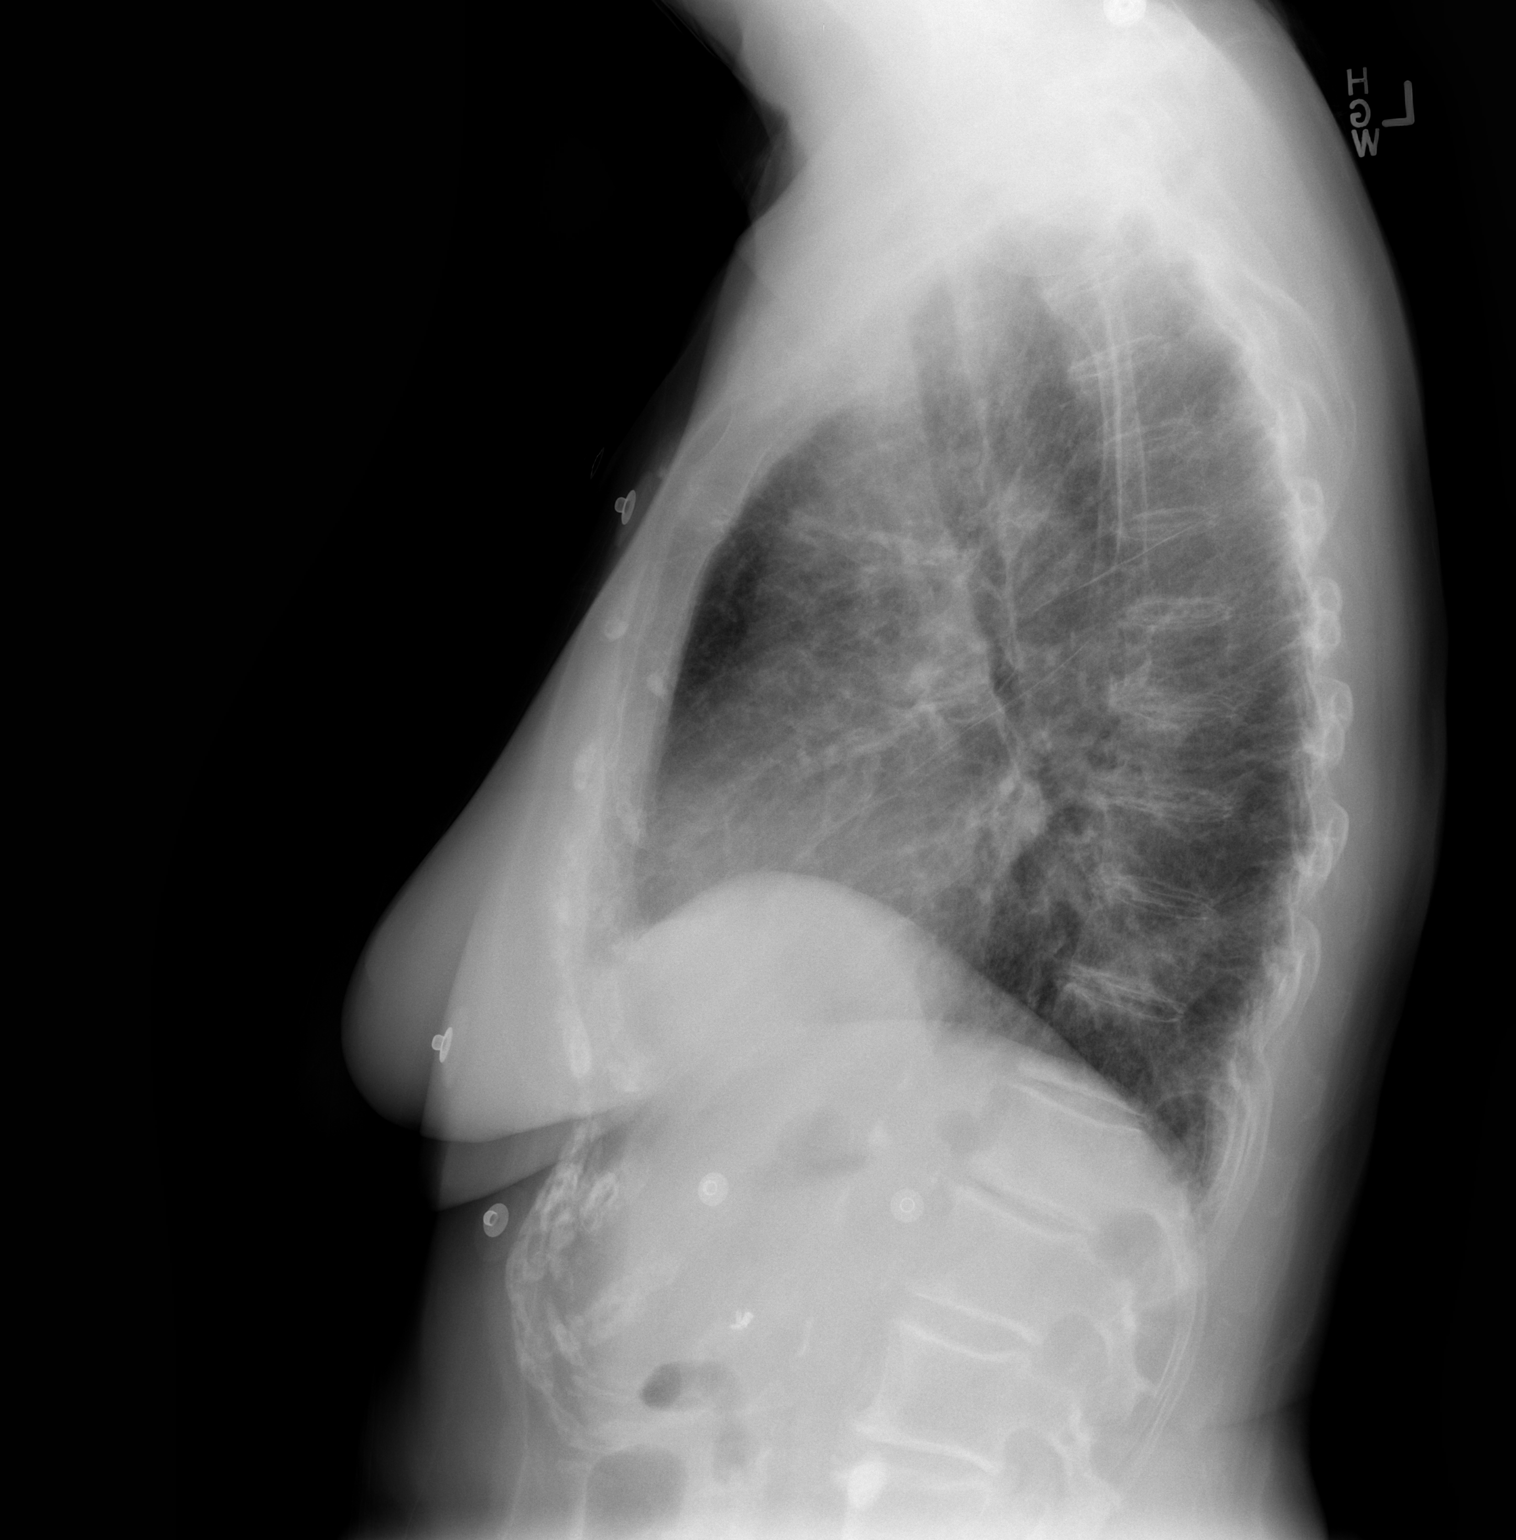

[2 of 2 positions shown; findings below may reference images not displayed]

FINDINGS: Cardiopericardial silhouette within normal limits. Mediastinal
contours normal. Aortic arch atherosclerosis. Diffusely increased
interstitial markings are present, most compatible with interstitial
pulmonary edema. There is no pleural effusion.
IMPRESSION: Nonspecific diffuse interstitial prominence, most compatible with
interstitial edema.

## 2016-07-30 ENCOUNTER — Encounter: Payer: Self-pay | Admitting: Cardiovascular Disease

## 2016-07-30 ENCOUNTER — Ambulatory Visit (INDEPENDENT_AMBULATORY_CARE_PROVIDER_SITE_OTHER): Payer: Medicare Other | Admitting: Cardiovascular Disease

## 2016-07-30 ENCOUNTER — Encounter (INDEPENDENT_AMBULATORY_CARE_PROVIDER_SITE_OTHER): Payer: Self-pay

## 2016-07-30 VITALS — BP 140/62 | HR 78 | Ht 65.0 in | Wt 150.0 lb

## 2016-07-30 DIAGNOSIS — I5032 Chronic diastolic (congestive) heart failure: Secondary | ICD-10-CM | POA: Diagnosis not present

## 2016-07-30 MED ORDER — AMIODARONE HCL 200 MG PO TABS: 200.0000 mg | ORAL_TABLET | ORAL | 3 refills | Status: DC

## 2016-07-30 NOTE — Patient Instructions (Signed)
Medication Instructions:  DECREASE Amiodarone to 200 mg once weekly   Labwork: None Ordered   Testing/Procedures: None Ordered   Follow-Up: Your physician wants you to follow-up in: 6 months with Dr. Acie Fredrickson.  You will receive a reminder letter in the mail two months in advance. If you don't receive a letter, please call our office to schedule the follow-up appointment.   If you need a refill on your cardiac medications before your next appointment, please call your pharmacy.   Thank you for choosing [facility]! Christen Bame, RN [phone]

## 2016-07-30 NOTE — Progress Notes (Signed)
Cardiology Office Note   Date:  07/30/2016   ID:  [name], [name], MRN [id number]  PCP:  Leighton Ruff, MD  Cardiologist:   Mertie Moores, MD   Chief Complaint  Patient presents with  . Follow-up    CHF      History of Present Illness: [name] is a 83 y.o. female who presents for   1. Hypertension 2. Hyperlipidemia 3. Hypothyroidism  History of Present Illness:  [name] is an 83 year old female with a history of diastolic dysfunction, mild aortic stenosis and hypothyroidism. She also has a history of tachybradycardia syndrome. She's had a stress Myoview study in May of 2010 which was normal. She has an ejection fraction of 75%. There was no ischemia. She had an echocardiogram performed in September, 2001 which revealed normal left ventricular systolic function with moderate left ventricular hypertrophy and mild aortic stenosis.  She presents today for followup visit. She does not have any complaints. She denies any chest pain or shortness of breath.   Sept. 12, 2014:  [name] is doing well now. She has had some dizziness and had several episodes of near syncope. Both episodes occurred while she was sitting. She was diagnosed with vertigo and was given meclizine. 24 hour Holter revealed NSR and 1 PVC.  We have an echocardiogram from October, 2013 which revealed normal left ventricular systolic function. She has mild aortic stenosis.  March 23, 2013:  She was last seen in Sept. 2014. Her BP has been a bit more variable. Her blood pressure varies between 150s to 120s. She does not eat any extra salt but she eats at the regular food bar at [facility] at [facility] - her husband eats at the heart healthy food bar.   Sept. 8, 2015:  She is being more careful with her salt intake. She is feeling well. She has some DOE.  She was admitted to the hospital last month with some shortness breath. Her troponin levels were negative.  Echocardiogram revealed mild aortic stenosis.  Left ventricle: The cavity size was normal. Wall thickness was increased in a pattern of mild LVH. Systolic function was normal. The estimated ejection fraction was in the range of 55% to 60%. Doppler parameters are consistent with abnormal left ventricular relaxation (grade 1 diastolic dysfunction). - Aortic valve: Moderately calcified annulus. There was mild stenosis. There was trivial regurgitation. Valve area (VTI): 1.32 cm^2. Valve area (Vmax): 1.46 cm^2. Valve area (Vmean): 1.34 cm^2. - Mitral valve: Calcified annulus. - Left atrium: The atrium was mildly dilated. - Atrial septum: No defect or patent foramen ovale was identified  Oct. 7, 2015:  Spell was recently hospitalized for diastolic dysfunction. She had Lasix added to her medical regimen. She was also found to be mildly hypothyroid and her Synthroid dose was increased. She's feeling better at this point. He apparently had some wheezing during her hospitalization and was prescribed a nebulizer. She has not been using the nebulizer and feels well.  Jan. 21, 2016:  [name] is seen back today for her diastolic dysfunction.  Breathing is ok.  Was in the Oct. For a right arm fracture ,  She had surgery.    Still very sore.    August 01, 2015:  [name] is seen back for follow up visit Doing well Still has some DOE with activity.   Does her own shopping ,  Keeps house ( husband helps) Walks with a cane on occasion   Jan.  9, 2018:  Is doing very well - best in a long time .  No CP or worsening dyspnea.   July 30, 2016:  [name] is seen back for follow up visit Doing great for 89. No significant dyspnea Uses her inhaler several times a week which helps   Past Medical History:  Diagnosis Date  . Anxiety   . Aortic stenosis    a. mild by echo 2017.  . Arthritis   . Bradycardia    a. ? details not clear - in the past has had PVCs which have lowered effective HR  count by pulse check.  . Chronic diastolic CHF (congestive heart failure) ([place])   . Diastolic dysfunction    mild   . Frequent PVCs    a. 48 Hour Holter 10/08/15 showed NSR and sinus tach with frequent PVCs, occasional bigeminy, total PVC burden 16.5%, lowest HR 59.  [name] Hyperlipidemia   . Hypertension   . Hypertensive heart disease   . Hypothyroidism   . Kidney stones   . Mild mitral regurgitation by prior echocardiogram   . Tachycardia    history of tachycardia and bradycardia    Past Surgical History:  Procedure Laterality Date  . APPENDECTOMY    . CHOLECYSTECTOMY    . EXTRACORPOREAL SHOCK WAVE LITHOTRIPSY    . EYE SURGERY Bilateral    cataract surgery with lens implant  . ORIF HUMERUS FRACTURE Right 10/31/2013   Procedure: Open Reduction Internal Fixation Right Proximal Humerus Fracture;  Surgeon: Marybelle Killings, MD;  Location: [facility];  Service: Orthopedics;  Laterality: Right;  . TONSILLECTOMY    . TOTAL ABDOMINAL HYSTERECTOMY       Current Outpatient Prescriptions  Medication Sig Dispense Refill  . acetaminophen (TYLENOL) 325 MG tablet Take 325 mg by mouth 2 (two) times daily as needed for mild pain, moderate pain or headache.     . ALPRAZolam (XANAX) 0.25 MG tablet Take 0.125 mg by mouth daily as needed for anxiety.     [name] amiodarone (PACERONE) 200 MG tablet Take 1 tablet (200 mg total) by mouth daily. 90 tablet 3  . amLODipine (NORVASC) 5 MG tablet Take 1 tablet (5 mg total) by mouth daily. 30 tablet 0  . cholecalciferol (VITAMIN D) 1000 units tablet Take 2,000 Units by mouth daily.     . furosemide (LASIX) 20 MG tablet Take 10 mg by mouth daily.     [name] levothyroxine (SYNTHROID, LEVOTHROID) 25 MCG tablet Take 12.5 mcg by mouth daily before breakfast. Pt takes with a 40mcg tablet.  2  . levothyroxine (SYNTHROID, LEVOTHROID) 50 MCG tablet Take 50 mcg by mouth daily. Pt takes with one-half of a 60mcg tablet.  0  . Multiple Vitamins-Minerals (PRESERVISION AREDS 2+MULTI VIT) CAPS  Take 1 capsule by mouth 2 (two) times daily.    . pravastatin (PRAVACHOL) 20 MG tablet Take 20 mg by mouth at bedtime.     . sertraline (ZOLOFT) 25 MG tablet Take 25 mg by mouth daily.      No current facility-administered medications for this visit.     Allergies:   Patient has no known allergies.    Social History:  The patient  reports that she has never smoked. She has never used smokeless tobacco. She reports that she does not drink alcohol or use drugs.   Family History:  The patient's family history includes Cancer in her brother; Emphysema in her father; Heart attack in her brother; Heart failure in her mother; Stroke  in her brother.    ROS:  Please see the history of present illness.   Otherwise, review of systems are positive for none.   All other systems are reviewed and negative.    PHYSICAL EXAM: VS:  BP 140/62   Pulse 78   Ht 5\' 5"  (1.651 m)   Wt 150 lb (68 kg)   LMP  (LMP Unknown)   BMI 24.96 kg/m  , BMI Body mass index is 24.96 kg/m. GEN: Well nourished, well developed, in no acute distress  HEENT: normal  Neck: no JVD, soft  left carotid bruit, or masses Cardiac: RRR;  Frequent PVCs. 1-6/1 systolic  murmurs, rubs, or gallops, no edema  Respiratory:  clear to auscultation bilaterally, normal work of breathing GI: soft, nontender, nondistended, + BS Diane: no deformity or atrophy  Skin: warm and dry, no rash Neuro:  Strength and sensation are intact Psych: euthymic mood, full affect   EKG:  EKG is ordered today. The ekg ordered today demonstrates ECG from the hospital reveal NSR with PACs   Recent Labs: 09/27/2015: TSH 3.092 10/18/2015: Brain Natriuretic Peptide 49.9; Magnesium 2.4 03/03/2016: ALT 16; BUN 15; Creatinine, Ser 0.81; Hemoglobin 13.2; Platelets 416; Potassium 4.3; Sodium 140    Lipid Panel    Component Value Date/Time   CHOL 181 10/29/2011 0829   TRIG 118 10/29/2011 0829   HDL 55 10/29/2011 0829   CHOLHDL 3.3 10/29/2011 0829   VLDL 24  10/29/2011 0829   LDLCALC 102 (H) 10/29/2011 0829      Wt Readings from Last 3 Encounters:  07/30/16 150 lb (68 kg)  03/03/16 151 lb (68.5 kg)  02/19/16 153 lb (69.4 kg)      Other studies Reviewed: Additional studies/ records that were reviewed today include: records and labs from medical . Review of the above records demonstrates: normal creatinine and potassium    ASSESSMENT AND PLAN:  1. Hypertension- her blood pressure is well-controlled. 2. Hyperlipidemia - Gen. labs drawn by her medical doctor.  Look fine. 3. Hypothyroidism- followed by her primary medical doctor. 4. Chronic diastolic dysfunction.  - Her chronic diastolic CHF seems to be well-controlled. Continue current dose of Lasix. Her renal function and potassium are within normal limits.  5. Left carotid bruit:  Is asymptomatic . She is not interested in doing a duplex or considering CEA.   6.   Dizziness:   Was found to have PVCs and was started on Amiodarone by Dr. Curt Bears .  We'll reduce her dose to 200 mg once a week.   I'll see her again in 6 months for follow-up visit.    Current medicines are reviewed at length with the patient today.  The patient does not have concerns regarding medicines.  The following changes have been made:  no change  Labs/ tests ordered today include:  No orders of the defined types were placed in this encounter.    Disposition:   FU with me  in 6 months   Signed, Mertie Moores, MD  07/30/2016 2:09 PM    [facility] [address], [place], [place]  [address] Phone: [phone]; Fax: [phone]

## 2016-08-20 ENCOUNTER — Ambulatory Visit: Payer: Medicare Other | Admitting: Cardiology

## 2016-08-27 IMAGING — RF DG C-ARM 61-120 MIN
1 series · 3 of 3 positions shown · non-contrast
Comparison: None.

CLINICAL DATA: ORIF of right humerus fracture

EXAM:
RIGHT HUMERUS - 2+ VIEW; DG C-ARM 61-120 MIN

[Series 1: run · 3 of 3 slices shown]
[im 1/3]
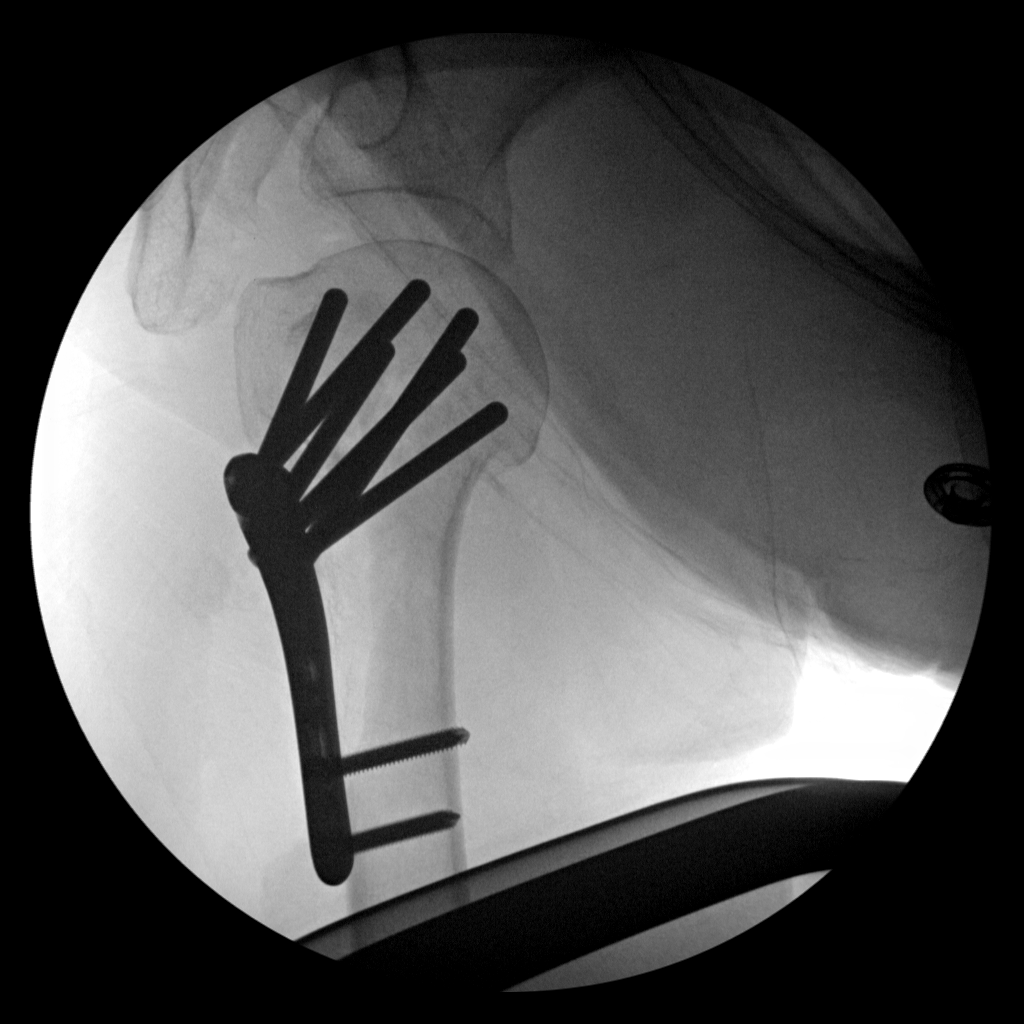
[im 2/3]
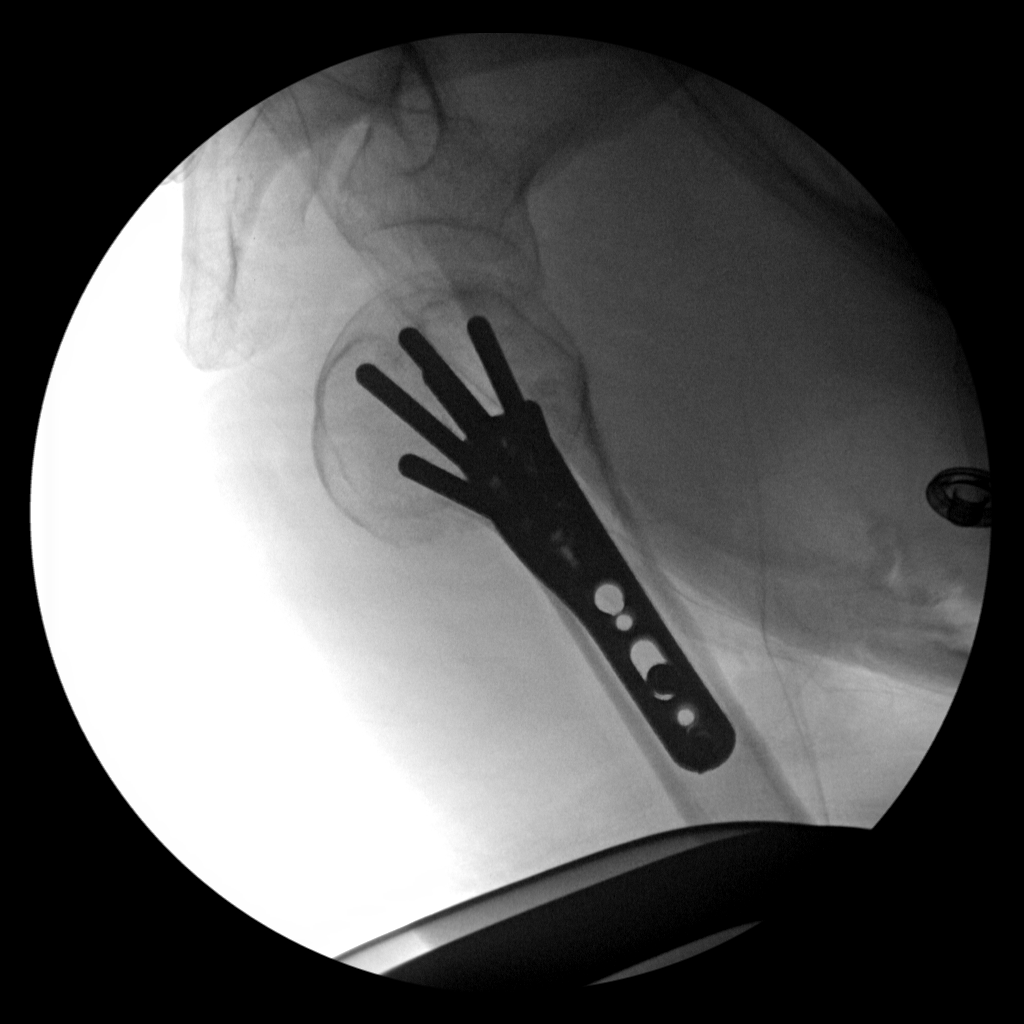
[im 3/3]
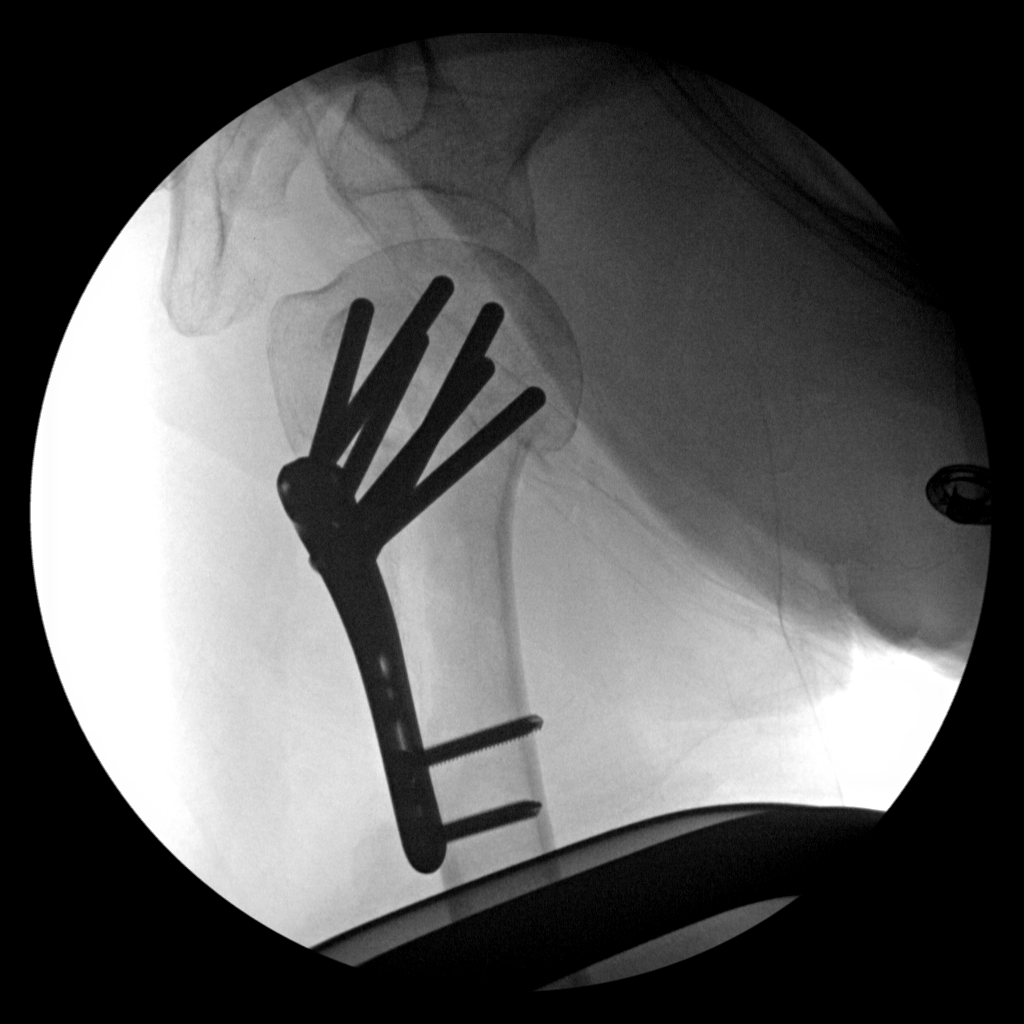

[3 of 3 positions shown; findings below may reference images not displayed]

FINDINGS: Intraoperative views of the right shoulder demonstrate lateral
cortical plate and multiple screws in the proximal humerus. Fracture
fragments appear in near anatomic alignment. No complicating feature
identified. No evidence of subluxation or dislocation of the
shoulder on these views.
IMPRESSION: ORIF for proximal humerus fracture. No complicating feature
identified.

## 2016-09-01 DIAGNOSIS — M62830 Muscle spasm of back: Secondary | ICD-10-CM | POA: Diagnosis not present

## 2016-10-13 DIAGNOSIS — H5201 Hypermetropia, right eye: Secondary | ICD-10-CM | POA: Diagnosis not present

## 2016-10-13 DIAGNOSIS — H353132 Nonexudative age-related macular degeneration, bilateral, intermediate dry stage: Secondary | ICD-10-CM | POA: Diagnosis not present

## 2016-10-13 DIAGNOSIS — H43813 Vitreous degeneration, bilateral: Secondary | ICD-10-CM | POA: Diagnosis not present

## 2016-10-13 DIAGNOSIS — Z23 Encounter for immunization: Secondary | ICD-10-CM | POA: Diagnosis not present

## 2016-10-27 DIAGNOSIS — Z23 Encounter for immunization: Secondary | ICD-10-CM | POA: Diagnosis not present

## 2016-10-27 DIAGNOSIS — L568 Other specified acute skin changes due to ultraviolet radiation: Secondary | ICD-10-CM | POA: Diagnosis not present

## 2016-10-27 DIAGNOSIS — L570 Actinic keratosis: Secondary | ICD-10-CM | POA: Diagnosis not present

## 2016-12-10 DIAGNOSIS — L570 Actinic keratosis: Secondary | ICD-10-CM | POA: Diagnosis not present

## 2017-01-26 DIAGNOSIS — N200 Calculus of kidney: Secondary | ICD-10-CM | POA: Diagnosis not present

## 2017-02-10 DIAGNOSIS — H00019 Hordeolum externum unspecified eye, unspecified eyelid: Secondary | ICD-10-CM | POA: Diagnosis not present

## 2017-02-10 DIAGNOSIS — M25569 Pain in unspecified knee: Secondary | ICD-10-CM | POA: Diagnosis not present

## 2017-02-10 DIAGNOSIS — M549 Dorsalgia, unspecified: Secondary | ICD-10-CM | POA: Diagnosis not present

## 2017-02-17 ENCOUNTER — Encounter: Payer: Self-pay | Admitting: Cardiovascular Disease

## 2017-02-24 ENCOUNTER — Ambulatory Visit (INDEPENDENT_AMBULATORY_CARE_PROVIDER_SITE_OTHER): Payer: Medicare Other | Admitting: Cardiovascular Disease

## 2017-02-24 ENCOUNTER — Encounter: Payer: Self-pay | Admitting: Cardiovascular Disease

## 2017-02-24 VITALS — BP 122/62 | HR 89 | Ht 65.0 in | Wt 153.4 lb

## 2017-02-24 DIAGNOSIS — I10 Essential (primary) hypertension: Secondary | ICD-10-CM

## 2017-02-24 DIAGNOSIS — M1712 Unilateral primary osteoarthritis, left knee: Secondary | ICD-10-CM | POA: Diagnosis not present

## 2017-02-24 DIAGNOSIS — M25511 Pain in right shoulder: Secondary | ICD-10-CM | POA: Diagnosis not present

## 2017-02-24 DIAGNOSIS — I5032 Chronic diastolic (congestive) heart failure: Secondary | ICD-10-CM

## 2017-02-24 DIAGNOSIS — E782 Mixed hyperlipidemia: Secondary | ICD-10-CM | POA: Diagnosis not present

## 2017-02-24 DIAGNOSIS — M25512 Pain in left shoulder: Secondary | ICD-10-CM | POA: Diagnosis not present

## 2017-02-24 DIAGNOSIS — E039 Hypothyroidism, unspecified: Secondary | ICD-10-CM

## 2017-02-24 DIAGNOSIS — M1711 Unilateral primary osteoarthritis, right knee: Secondary | ICD-10-CM | POA: Diagnosis not present

## 2017-02-24 LAB — LIPID PANEL
CHOLESTEROL TOTAL: 184 mg/dL (ref 100–199)
Chol/HDL Ratio: 3.5 ratio (ref 0.0–4.4)
HDL: 53 mg/dL (ref >39)
LDL Calculated: 95 mg/dL (ref 0–99)
Triglycerides: 179 mg/dL — ABNORMAL HIGH (ref 0–149)
VLDL CHOLESTEROL CAL: 36 mg/dL (ref 5–40)

## 2017-02-24 LAB — BASIC METABOLIC PANEL
BUN / CREAT RATIO: 20 (ref 12–28)
BUN: 14 mg/dL (ref 10–36)
CALCIUM: 9.5 mg/dL (ref 8.7–10.3)
CHLORIDE: 105 mmol/L (ref 96–106)
CO2: 23 mmol/L (ref 20–29)
Creatinine, Ser: 0.71 mg/dL (ref 0.57–1.00)
GFR calc Af Amer: 85 mL/min/{1.73_m2} (ref >59)
GFR calc non Af Amer: 74 mL/min/{1.73_m2} (ref >59)
GLUCOSE: 93 mg/dL (ref 65–99)
POTASSIUM: 4.8 mmol/L (ref 3.5–5.2)
Sodium: 143 mmol/L (ref 134–144)

## 2017-02-24 LAB — TSH: TSH: 0.877 u[IU]/mL (ref 0.450–4.500)

## 2017-02-24 LAB — HEPATIC FUNCTION PANEL
ALT: 14 IU/L (ref 0–32)
AST: 14 IU/L (ref 0–40)
Albumin: 4.3 g/dL (ref 3.2–4.6)
Alkaline Phosphatase: 107 IU/L (ref 39–117)
BILIRUBIN TOTAL: 0.5 mg/dL (ref 0.0–1.2)
Bilirubin, Direct: 0.11 mg/dL (ref 0.00–0.40)
Total Protein: 7.2 g/dL (ref 6.0–8.5)

## 2017-02-24 NOTE — Progress Notes (Signed)
Cardiology Office Note   Date:  02/24/2017   ID:  JESSILYNN TAFT, [name] 1921/05/23, MRN [id number]  PCP:  Leighton Ruff, MD  Cardiologist:   Mertie Moores, MD   Chief Complaint  Patient presents with  . Follow-up    chronic diastolic CHF      History of Present Illness: [name] is a 84 y.o. female who presents for   1. Hypertension 2. Hyperlipidemia 3. Hypothyroidism  Notes prior to 2014:   Mrs. Portillo is an 84 year old female with a history of diastolic dysfunction, mild aortic stenosis and hypothyroidism. She also has a history of tachybradycardia syndrome. She's had a stress Myoview study in May of 2010 which was normal. She has an ejection fraction of 75%. There was no ischemia. She had an echocardiogram performed in September, 2001 which revealed normal left ventricular systolic function with moderate left ventricular hypertrophy and mild aortic stenosis.  She presents today for followup visit. She does not have any complaints. She denies any chest pain or shortness of breath.   Sept. 12, 2014:  Ms. Duhon is doing well now. She has had some dizziness and had several episodes of near syncope. Both episodes occurred while she was sitting. She was diagnosed with vertigo and was given meclizine. 24 hour Holter revealed NSR and 1 PVC.  We have an echocardiogram from October, 2013 which revealed normal left ventricular systolic function. She has mild aortic stenosis.  March 23, 2013:  She was last seen in Sept. 2014. Her BP has been a bit more variable. Her blood pressure varies between 150s to 120s. She does not eat any extra salt but she eats at the regular food bar at [facility] - her husband eats at the heart healthy food bar.   Sept. 8, 2015:  She is being more careful with her salt intake. She is feeling well. She has some DOE.  She was admitted to the hospital last month with some shortness breath. Her troponin levels were  negative. Echocardiogram revealed mild aortic stenosis.  Left ventricle: The cavity size was normal. Wall thickness was increased in a pattern of mild LVH. Systolic function was normal. The estimated ejection fraction was in the range of 55% to 60%. Doppler parameters are consistent with abnormal left ventricular relaxation (grade 1 diastolic dysfunction). - Aortic valve: Moderately calcified annulus. There was mild stenosis. There was trivial regurgitation. Valve area (VTI): 1.32 cm^2. Valve area (Vmax): 1.46 cm^2. Valve area (Vmean): 1.34 cm^2. - Mitral valve: Calcified annulus. - Left atrium: The atrium was mildly dilated. - Atrial septum: No defect or patent foramen ovale was identified  Oct. 7, 2015:  Spell was recently hospitalized for diastolic dysfunction. She had Lasix added to her medical regimen. She was also found to be mildly hypothyroid and her Synthroid dose was increased. She's feeling better at this point. He apparently had some wheezing during her hospitalization and was prescribed a nebulizer. She has not been using the nebulizer and feels well.  Jan. 21, 2016:  Ms Loyd is seen back today for her diastolic dysfunction.  Breathing is ok.  Was in the Oct. For a right arm fracture ,  She had surgery.    Still very sore.    August 01, 2015:  Ms Gilmore is seen back for follow up visit Doing well Still has some DOE with activity.   Does her own shopping ,  Keeps house ( husband helps) Walks with a cane on occasion  Jan. 9, 2018:  Is doing very well - best in a long time .  No CP or worsening dyspnea.   July 30, 2016:  Ms. Rodenbeck is seen back for follow up visit Doing great for 89. No significant dyspnea Uses her inhaler several times a week which helps   Feb. 12, 2019:  Feels well  typical aches and pains in joints  Still gets out of breath with activities - does housework  No CP, no syncope  Is on Amiodarone 200 mg once a week - is maintaining NSR     Past Medical History:  Diagnosis Date  . Anxiety   . Aortic stenosis    a. mild by echo 2017.  . Arthritis   . Bradycardia    a. ? details not clear - in the past has had PVCs which have lowered effective HR count by pulse check.  . Chronic diastolic CHF (congestive heart failure) ([place])   . Diastolic dysfunction    mild   . Frequent PVCs    a. 48 Hour Holter 10/08/15 showed NSR and sinus tach with frequent PVCs, occasional bigeminy, total PVC burden 16.5%, lowest HR 59.  [name] Hyperlipidemia   . Hypertension   . Hypertensive heart disease   . Hypothyroidism   . Kidney stones   . Mild mitral regurgitation by prior echocardiogram   . Tachycardia    history of tachycardia and bradycardia    Past Surgical History:  Procedure Laterality Date  . APPENDECTOMY    . CHOLECYSTECTOMY    . EXTRACORPOREAL SHOCK WAVE LITHOTRIPSY    . EYE SURGERY Bilateral    cataract surgery with lens implant  . ORIF HUMERUS FRACTURE Right 10/31/2013   Procedure: Open Reduction Internal Fixation Right Proximal Humerus Fracture;  Surgeon: Marybelle Killings, MD;  Location: [facility];  Service: Orthopedics;  Laterality: Right;  . TONSILLECTOMY    . TOTAL ABDOMINAL HYSTERECTOMY       Current Outpatient Medications  Medication Sig Dispense Refill  . acetaminophen (TYLENOL) 325 MG tablet Take 500 mg by mouth 2 (two) times daily as needed for mild pain, moderate pain or headache.     . ALPRAZolam (XANAX) 0.25 MG tablet Take 0.125 mg by mouth daily as needed for anxiety.     [name] amiodarone (PACERONE) 200 MG tablet Take 1 tablet (200 mg total) by mouth once a week. 45 tablet 3  . amLODipine (NORVASC) 5 MG tablet Take 1 tablet (5 mg total) by mouth daily. 30 tablet 0  . cholecalciferol (VITAMIN D) 1000 units tablet Take 2,000 Units by mouth daily.     . furosemide (LASIX) 20 MG tablet Take 10 mg by mouth daily.     [name] levothyroxine (SYNTHROID, LEVOTHROID) 25 MCG tablet Take 12.5 mcg by mouth daily before breakfast. Pt  takes with a 39mcg tablet.  2  . levothyroxine (SYNTHROID, LEVOTHROID) 50 MCG tablet Take 50 mcg by mouth daily. Pt takes with one-half of a 27mcg tablet.  0  . Multiple Vitamins-Minerals (PRESERVISION AREDS 2+MULTI VIT) CAPS Take 1 capsule by mouth 2 (two) times daily.    . pravastatin (PRAVACHOL) 20 MG tablet Take 20 mg by mouth at bedtime.     . sertraline (ZOLOFT) 25 MG tablet Take 25 mg by mouth daily.      No current facility-administered medications for this visit.     Allergies:   Patient has no known allergies.    Social History:  The patient  reports that  has  never smoked. she has never used smokeless tobacco. She reports that she does not drink alcohol or use drugs.   Family History:  The patient's family history includes Cancer in her brother; Emphysema in her father; Heart attack in her brother; Heart failure in her mother; Stroke in her brother.    ROS:  Please see the history of present illness.   Otherwise, review of systems are positive for none.   All other systems are reviewed and negative.   Physical Exam: Blood pressure 122/62, pulse 89, height 5\' 5"  (1.651 m), weight 153 lb 6.4 oz (69.6 kg), SpO2 95 %.  GEN:  Well nourished, well developed in no acute distress HEENT: Normal NECK: No JVD; No carotid bruits LYMPHATICS: No lymphadenopathy CARDIAC: RR,   Soft systolic murmur  RESPIRATORY:  Clear to auscultation without rales, wheezing or rhonchi  ABDOMEN: Soft, non-tender, non-distended MUSCULOSKELETAL:  No edema; No deformity  SKIN: Warm and dry NEUROLOGIC:  Alert and oriented x 3    EKG:     February 24, 2017: Normal sinus rhythm at 89.  Normal EKG.  Recent Labs: 03/03/2016: ALT 16; BUN 15; Creatinine, Ser 0.81; Hemoglobin 13.2; Platelets 416; Potassium 4.3; Sodium 140    Lipid Panel    Component Value Date/Time   CHOL 181 10/29/2011 0829   TRIG 118 10/29/2011 0829   HDL 55 10/29/2011 0829   CHOLHDL 3.3 10/29/2011 0829   VLDL 24 10/29/2011 0829    LDLCALC 102 (H) 10/29/2011 0829      Wt Readings from Last 3 Encounters:  02/24/17 153 lb 6.4 oz (69.6 kg)  07/30/16 150 lb (68 kg)  03/03/16 151 lb (68.5 kg)      Other studies Reviewed: Additional studies/ records that were reviewed today include: records and labs from medical . Review of the above records demonstrates: normal creatinine and potassium    ASSESSMENT AND PLAN:  1. Hypertension-    blood pressure is well controlled.  Continue current medications  2. Hyperlipidemia -patient remains on Pravachol.  We will check her labs today.  3. Hypothyroidism-    she has not had any thyroid labs drawn in over a year.  We will check a TSH today and forwarded to Dr. Drema Dallas.   4. Chronic diastolic dysfunction.  -Her chronic diastolic congestive heart failure remains very stable.  5. Left carotid bruit:   - stable ,  She does not want to consider CEA.  Asymptomatic   6.   Systolic murmur :   Has   Mild AS.   Also has mild AI, mild MR, mild TR   I'll see her again in 1 year     Current medicines are reviewed at length with the patient today.  The patient does not have concerns regarding medicines.  The following changes have been made:  no change  Labs/ tests ordered today include:   Orders Placed This Encounter  Procedures  . TSH  . TSH  . Basic Metabolic Panel (BMET)  . Basic Metabolic Panel (BMET)  . Hepatic function panel  . Hepatic function panel  . Lipid Profile  . Lipid Profile  . EKG 12-Lead     Signed, Mertie Moores, MD  02/24/2017 11:14 AM    [facility] [address], [place], [place]  [address] Phone: [phone]; Fax: [phone]

## 2017-02-24 NOTE — Patient Instructions (Signed)
Medication Instructions:  Your physician recommends that you continue on your current medications as directed. Please refer to the Current Medication list given to you today.   Labwork: TODAY - TSH, BMET, Liver, Cholesterol  Your physician recommends that you return for lab work in: 1 year on the day of or a few days before your office visit with Dr. Acie Fredrickson.  You will need to FAST for this appointment - nothing to eat or drink after midnight the night before except water.   Testing/Procedures: None Ordered   Follow-Up: Your physician wants you to follow-up in: 1 year with Dr. Acie Fredrickson. You will receive a reminder letter in the mail two months in advance. If you don't receive a letter, please call our office to schedule the follow-up appointment.   If you need a refill on your cardiac medications before your next appointment, please call your pharmacy.   Thank you for choosing [facility]! Christen Bame, RN [phone]

## 2017-03-04 DIAGNOSIS — Z23 Encounter for immunization: Secondary | ICD-10-CM | POA: Diagnosis not present

## 2017-03-04 DIAGNOSIS — L570 Actinic keratosis: Secondary | ICD-10-CM | POA: Diagnosis not present

## 2017-03-24 DIAGNOSIS — L570 Actinic keratosis: Secondary | ICD-10-CM | POA: Diagnosis not present

## 2017-04-07 DIAGNOSIS — M545 Low back pain: Secondary | ICD-10-CM | POA: Diagnosis not present

## 2017-06-03 DIAGNOSIS — E039 Hypothyroidism, unspecified: Secondary | ICD-10-CM | POA: Diagnosis not present

## 2017-06-03 DIAGNOSIS — R7989 Other specified abnormal findings of blood chemistry: Secondary | ICD-10-CM | POA: Diagnosis not present

## 2017-06-03 DIAGNOSIS — F419 Anxiety disorder, unspecified: Secondary | ICD-10-CM | POA: Diagnosis not present

## 2017-06-03 DIAGNOSIS — F339 Major depressive disorder, recurrent, unspecified: Secondary | ICD-10-CM | POA: Diagnosis not present

## 2017-06-03 DIAGNOSIS — E7800 Pure hypercholesterolemia, unspecified: Secondary | ICD-10-CM | POA: Diagnosis not present

## 2017-06-03 DIAGNOSIS — I10 Essential (primary) hypertension: Secondary | ICD-10-CM | POA: Diagnosis not present

## 2017-07-21 ENCOUNTER — Other Ambulatory Visit: Payer: Self-pay | Admitting: Gastroenterology

## 2017-07-21 DIAGNOSIS — R140 Abdominal distension (gaseous): Secondary | ICD-10-CM | POA: Diagnosis not present

## 2017-07-21 DIAGNOSIS — R1084 Generalized abdominal pain: Secondary | ICD-10-CM

## 2017-07-23 ENCOUNTER — Ambulatory Visit
Admission: RE | Admit: 2017-07-23 | Discharge: 2017-07-23 | Disposition: A | Discharge status: Home / Self Care | Payer: Medicare Other | Source: Ambulatory Visit | Attending: Gastroenterology | Admitting: Gastroenterology

## 2017-07-23 DIAGNOSIS — R1084 Generalized abdominal pain: Secondary | ICD-10-CM

## 2017-07-23 DIAGNOSIS — R140 Abdominal distension (gaseous): Secondary | ICD-10-CM

## 2017-07-23 DIAGNOSIS — N200 Calculus of kidney: Secondary | ICD-10-CM | POA: Diagnosis not present

## 2017-07-23 MED ORDER — IOPAMIDOL (ISOVUE-300) INJECTION 61%
100.0000 mL | Freq: Once | INTRAVENOUS | Status: AC | PRN
  Administered 2017-07-23: 100 mL via INTRAVENOUS

## 2017-07-31 ENCOUNTER — Other Ambulatory Visit: Payer: Self-pay | Admitting: Physician Assistant

## 2017-08-04 ENCOUNTER — Other Ambulatory Visit: Payer: Medicare Other

## 2017-08-08 ENCOUNTER — Other Ambulatory Visit: Payer: Self-pay | Admitting: Physician Assistant

## 2017-08-24 DIAGNOSIS — E7800 Pure hypercholesterolemia, unspecified: Secondary | ICD-10-CM | POA: Diagnosis not present

## 2017-08-24 DIAGNOSIS — I10 Essential (primary) hypertension: Secondary | ICD-10-CM | POA: Diagnosis not present

## 2017-08-24 DIAGNOSIS — M810 Age-related osteoporosis without current pathological fracture: Secondary | ICD-10-CM | POA: Diagnosis not present

## 2017-08-24 DIAGNOSIS — F339 Major depressive disorder, recurrent, unspecified: Secondary | ICD-10-CM | POA: Diagnosis not present

## 2017-08-24 DIAGNOSIS — J9801 Acute bronchospasm: Secondary | ICD-10-CM | POA: Diagnosis not present

## 2017-08-24 DIAGNOSIS — E039 Hypothyroidism, unspecified: Secondary | ICD-10-CM | POA: Diagnosis not present

## 2017-08-24 DIAGNOSIS — F419 Anxiety disorder, unspecified: Secondary | ICD-10-CM | POA: Diagnosis not present

## 2017-10-12 DIAGNOSIS — H5201 Hypermetropia, right eye: Secondary | ICD-10-CM | POA: Diagnosis not present

## 2017-10-12 DIAGNOSIS — H43813 Vitreous degeneration, bilateral: Secondary | ICD-10-CM | POA: Diagnosis not present

## 2017-10-12 DIAGNOSIS — Z961 Presence of intraocular lens: Secondary | ICD-10-CM | POA: Diagnosis not present

## 2017-10-12 DIAGNOSIS — H353132 Nonexudative age-related macular degeneration, bilateral, intermediate dry stage: Secondary | ICD-10-CM | POA: Diagnosis not present

## 2017-11-17 DIAGNOSIS — Z23 Encounter for immunization: Secondary | ICD-10-CM | POA: Diagnosis not present

## 2017-11-24 DIAGNOSIS — Z23 Encounter for immunization: Secondary | ICD-10-CM | POA: Diagnosis not present

## 2017-11-24 DIAGNOSIS — L570 Actinic keratosis: Secondary | ICD-10-CM | POA: Diagnosis not present

## 2018-02-20 ENCOUNTER — Encounter (HOSPITAL_COMMUNITY): Payer: Self-pay | Admitting: Emergency Medicine

## 2018-02-20 ENCOUNTER — Emergency Department (HOSPITAL_COMMUNITY)
Admission: EM | Admit: 2018-02-20 | Discharge: 2018-02-20 | Disposition: A | Discharge status: Home / Self Care | Payer: Medicare Other | Attending: Emergency Medicine | Admitting: Emergency Medicine

## 2018-02-20 ENCOUNTER — Emergency Department (HOSPITAL_COMMUNITY): Payer: Medicare Other

## 2018-02-20 DIAGNOSIS — G458 Other transient cerebral ischemic attacks and related syndromes: Secondary | ICD-10-CM | POA: Insufficient documentation

## 2018-02-20 DIAGNOSIS — R42 Dizziness and giddiness: Secondary | ICD-10-CM | POA: Diagnosis not present

## 2018-02-20 DIAGNOSIS — R200 Anesthesia of skin: Secondary | ICD-10-CM | POA: Diagnosis present

## 2018-02-20 DIAGNOSIS — Z79899 Other long term (current) drug therapy: Secondary | ICD-10-CM | POA: Insufficient documentation

## 2018-02-20 DIAGNOSIS — R479 Unspecified speech disturbances: Secondary | ICD-10-CM | POA: Diagnosis not present

## 2018-02-20 DIAGNOSIS — Z7902 Long term (current) use of antithrombotics/antiplatelets: Secondary | ICD-10-CM | POA: Diagnosis not present

## 2018-02-20 DIAGNOSIS — I110 Hypertensive heart disease with heart failure: Secondary | ICD-10-CM | POA: Insufficient documentation

## 2018-02-20 DIAGNOSIS — I5033 Acute on chronic diastolic (congestive) heart failure: Secondary | ICD-10-CM | POA: Diagnosis not present

## 2018-02-20 DIAGNOSIS — R2990 Unspecified symptoms and signs involving the nervous system: Secondary | ICD-10-CM

## 2018-02-20 DIAGNOSIS — R0902 Hypoxemia: Secondary | ICD-10-CM | POA: Diagnosis not present

## 2018-02-20 DIAGNOSIS — I10 Essential (primary) hypertension: Secondary | ICD-10-CM | POA: Diagnosis not present

## 2018-02-20 DIAGNOSIS — R531 Weakness: Secondary | ICD-10-CM | POA: Diagnosis not present

## 2018-02-20 DIAGNOSIS — E039 Hypothyroidism, unspecified: Secondary | ICD-10-CM | POA: Diagnosis not present

## 2018-02-20 DIAGNOSIS — R0602 Shortness of breath: Secondary | ICD-10-CM | POA: Diagnosis not present

## 2018-02-20 DIAGNOSIS — R410 Disorientation, unspecified: Secondary | ICD-10-CM | POA: Diagnosis not present

## 2018-02-20 LAB — COMPREHENSIVE METABOLIC PANEL
ALT: 14 U/L (ref 0–44)
AST: 21 U/L (ref 15–41)
Albumin: 3.6 g/dL (ref 3.5–5.0)
Alkaline Phosphatase: 98 U/L (ref 38–126)
Anion gap: 11 (ref 5–15)
BUN: 12 mg/dL (ref 8–23)
CO2: 24 mmol/L (ref 22–32)
Calcium: 9.2 mg/dL (ref 8.9–10.3)
Chloride: 105 mmol/L (ref 98–111)
Creatinine, Ser: 0.69 mg/dL (ref 0.44–1.00)
GFR calc Af Amer: >60 mL/min (ref >60)
GFR calc non Af Amer: >60 mL/min (ref >60)
Glucose, Bld: 117 mg/dL — ABNORMAL HIGH (ref 70–99)
Potassium: 4.1 mmol/L (ref 3.5–5.1)
Sodium: 140 mmol/L (ref 135–145)
Total Bilirubin: 0.9 mg/dL (ref 0.3–1.2)
Total Protein: 7 g/dL (ref 6.5–8.1)

## 2018-02-20 LAB — URINALYSIS, ROUTINE W REFLEX MICROSCOPIC
Bilirubin Urine: NEGATIVE
Glucose, UA: NEGATIVE mg/dL
Hgb urine dipstick: NEGATIVE
Ketones, ur: 5 mg/dL — AB
Nitrite: NEGATIVE
Protein, ur: NEGATIVE mg/dL
Specific Gravity, Urine: 1.017 (ref 1.005–1.030)
pH: 5 (ref 5.0–8.0)

## 2018-02-20 LAB — PROTIME-INR
INR: 0.93
Prothrombin Time: 12.3 seconds (ref 11.4–15.2)

## 2018-02-20 LAB — CBC
HCT: 38.3 % (ref 36.0–46.0)
Hemoglobin: 12.2 g/dL (ref 12.0–15.0)
MCH: 30.5 pg (ref 26.0–34.0)
MCHC: 31.9 g/dL (ref 30.0–36.0)
MCV: 95.8 fL (ref 80.0–100.0)
Platelets: 336 10*3/uL (ref 150–400)
RBC: 4 MIL/uL (ref 3.87–5.11)
RDW: 13.8 % (ref 11.5–15.5)
WBC: 8.3 10*3/uL (ref 4.0–10.5)
nRBC: 0 % (ref 0.0–0.2)

## 2018-02-20 LAB — DIFFERENTIAL
Abs Immature Granulocytes: 0.02 10*3/uL (ref 0.00–0.07)
Basophils Absolute: 0.1 10*3/uL (ref 0.0–0.1)
Basophils Relative: 1 %
Eosinophils Absolute: 0.2 10*3/uL (ref 0.0–0.5)
Eosinophils Relative: 2 %
Immature Granulocytes: 0 %
Lymphocytes Relative: 38 %
Lymphs Abs: 3.2 10*3/uL (ref 0.7–4.0)
Monocytes Absolute: 0.6 10*3/uL (ref 0.1–1.0)
Monocytes Relative: 7 %
Neutro Abs: 4.4 10*3/uL (ref 1.7–7.7)
Neutrophils Relative %: 52 %

## 2018-02-20 LAB — RAPID URINE DRUG SCREEN, HOSP PERFORMED
Amphetamines: NOT DETECTED
Barbiturates: NOT DETECTED
Benzodiazepines: NOT DETECTED
Cocaine: NOT DETECTED
Opiates: NOT DETECTED
Tetrahydrocannabinol: NOT DETECTED

## 2018-02-20 LAB — APTT: aPTT: 31 seconds (ref 24–36)

## 2018-02-20 NOTE — ED Provider Notes (Signed)
[facility] EMERGENCY DEPARTMENT Provider Note   CSN: [id number] Arrival date & time: 02/20/18  1543     History   Chief Complaint No chief complaint on file.   HPI [name] is a 85 y.o. female with history of aortic stenosis, CHF, HLD, HTN presents for evaluation of acute onset, improving difficulty ambulating.  She reports that she was at [employer] with her husband at around 1:30 PM when she acutely began to feel "weird". She denies headache but states "it almost felt like my forehead was numb".  She also notes associated difficulty ambulating stating "I felt like my legs were heavy and I could not get them to work".  Also endorses dysphagia and aphasia (described as word-finding difficulty).  Denies slurred speech, facial droop, vision changes, diplopia, lightheadedness, nausea, vomiting, or chest pain.  Denies numbness to the extremities.  Reports symptoms have improved.  States that she has had a history of vertigo but this does not feel similar.  Typically ambulates independently without the aid of a cane or a walker.  Has not tried anything for her symptoms.  The history is provided by the patient.    Past Medical History:  Diagnosis Date  . Anxiety   . Aortic stenosis    a. mild by echo 2017.  . Arthritis   . Bradycardia    a. ? details not clear - in the past has had PVCs which have lowered effective HR count by pulse check.  . Chronic diastolic CHF (congestive heart failure) ([place])   . Diastolic dysfunction    mild   . Frequent PVCs    a. 48 Hour Holter 10/08/15 showed NSR and sinus tach with frequent PVCs, occasional bigeminy, total PVC burden 16.5%, lowest HR 59.  [name] Hyperlipidemia   . Hypertension   . Hypertensive heart disease   . Hypothyroidism   . Kidney stones   . Mild mitral regurgitation by prior echocardiogram   . Tachycardia    history of tachycardia and bradycardia    Patient Active Problem List   Diagnosis Date Noted  . Frequent PVCs     . Bradycardia   . Hypertensive heart disease   . Hyperlipidemia 02/02/2014  . Fracture of humerus, proximal, right, closed 10/31/2013  . Proximal humerus fracture 10/31/2013  . UTI (urinary tract infection) 09/24/2013  . Acute on chronic diastolic congestive heart failure ([place]) 09/24/2013  . Abnormal EKG 09/24/2013  . Acute on chronic diastolic heart failure ([place]) 09/24/2013  . Infection of left eye 09/24/2013  . Near syncope 09/24/2012  . Hypothyroidism 10/30/2011  . Chest pain 10/29/2011  . Chronic diastolic CHF (congestive heart failure) ([place]) 07/05/2010  . Nonspecific (abnormal) findings on radiological and other examination of body structure 11/12/2009  . CT, CHEST, ABNORMAL 11/12/2009  . DYSPNEA ON EXERTION 06/05/2009    Past Surgical History:  Procedure Laterality Date  . APPENDECTOMY    . CHOLECYSTECTOMY    . EXTRACORPOREAL SHOCK WAVE LITHOTRIPSY    . EYE SURGERY Bilateral    cataract surgery with lens implant  . ORIF HUMERUS FRACTURE Right 10/31/2013   Procedure: Open Reduction Internal Fixation Right Proximal Humerus Fracture;  Surgeon: Marybelle Killings, MD;  Location: [facility];  Service: Orthopedics;  Laterality: Right;  . TONSILLECTOMY    . TOTAL ABDOMINAL HYSTERECTOMY       OB History   No obstetric history on file.      Home Medications    Prior to Admission medications  Medication Sig Start Date End Date Taking? Authorizing Provider  acetaminophen (TYLENOL) 500 MG tablet Take 500-1,000 mg by mouth every 6 (six) hours as needed (for pain or headaches).   Yes [provider]  furosemide (LASIX) 20 MG tablet Take 10 mg by mouth daily.    Yes [provider]  ALPRAZolam (XANAX) 0.25 MG tablet Take 0.125 mg by mouth daily as needed for anxiety.     [provider]  amiodarone (PACERONE) 200 MG tablet Take 1 tablet (200 mg total) by mouth once a week. 07/30/16   [name], Wonda Cheng, MD  amLODipine (NORVASC) 5 MG tablet Take 1 tablet (5 mg  total) by mouth daily. 03/30/15   [name], Wonda Cheng, MD  cholecalciferol (VITAMIN D) 1000 units tablet Take 2,000 Units by mouth daily.     [provider]  hydrocortisone 2.5 % ointment  11/24/17   [provider]  levothyroxine (SYNTHROID, LEVOTHROID) 25 MCG tablet Take 12.5 mcg by mouth daily before breakfast. Pt takes with a 4mcg tablet.    [provider]  levothyroxine (SYNTHROID, LEVOTHROID) 50 MCG tablet Take 50 mcg by mouth daily. Pt takes with one-half of a 68mcg tablet.    [provider]  meloxicam (MOBIC) 7.5 MG tablet  01/16/18   [provider]  Multiple Vitamins-Minerals (PRESERVISION AREDS 2+MULTI VIT) CAPS Take 1 capsule by mouth 2 (two) times daily.    [provider]  pravastatin (PRAVACHOL) 20 MG tablet Take 20 mg by mouth at bedtime.     [provider]  sertraline (ZOLOFT) 25 MG tablet Take 25 mg by mouth daily.     [provider]  Calcium Carb-Cholecalciferol (CALCIUM 1000 + D PO) Take 1,000 mg by mouth daily.    04/01/11  [provider]  escitalopram (LEXAPRO) 10 MG tablet Take 10 mg by mouth daily.    04/01/11  [provider]    Family History Family History  Problem Relation Age of Onset  . Emphysema Father   . Heart failure Mother   . Heart attack Brother   . Cancer Brother        kidney  . Stroke Brother     Social History Social History   Tobacco Use  . Smoking status: Never Smoker  . Smokeless tobacco: Never Used  Substance Use Topics  . Alcohol use: No  . Drug use: No     Allergies   Patient has no known allergies.   Review of Systems Review of Systems  Constitutional: Negative for chills and fever.  HENT: Positive for trouble swallowing.   Eyes: Negative for photophobia and visual disturbance.  Respiratory: Positive for shortness of breath (chronic, patient reports at baseline).   Cardiovascular: Negative for chest pain.  Gastrointestinal: Negative  for abdominal pain, nausea and vomiting.  Musculoskeletal: Positive for gait problem.  Neurological: Positive for dizziness, speech difficulty, weakness and headaches (?). Negative for facial asymmetry, light-headedness and numbness.  All other systems reviewed and are negative.    Physical Exam Updated Vital Signs BP (!) 155/63   Pulse 85   Temp 97.8 F (36.6 C) (Oral)   Resp 13   Ht 5\' 5"  (1.651 m)   Wt 68 kg   LMP  (LMP Unknown)   SpO2 99%   BMI 24.96 kg/m   Physical Exam Vitals signs and nursing note reviewed.  Constitutional:      General: She is not in acute distress.    Appearance: Normal appearance. She  is well-developed.  HENT:     Head: Normocephalic and atraumatic.  Eyes:     General:        Right eye: No discharge.        Left eye: No discharge.     Extraocular Movements: Extraocular movements intact.     Conjunctiva/sclera: Conjunctivae normal.     Pupils: Pupils are equal, round, and reactive to light.  Neck:     Vascular: No JVD.     Trachea: No tracheal deviation.  Cardiovascular:     Rate and Rhythm: Normal rate and regular rhythm.     Pulses: Normal pulses.     Heart sounds: Normal heart sounds.     Comments: Trace pitting edema of the bilateral lower extremities Pulmonary:     Effort: Pulmonary effort is normal.     Breath sounds: Normal breath sounds.  Abdominal:     General: Abdomen is flat. There is no distension.     Tenderness: There is no abdominal tenderness. There is no guarding or rebound.  Musculoskeletal: Normal range of motion.        General: No swelling, tenderness or deformity.  Skin:    General: Skin is warm and dry.     Findings: No erythema.  Neurological:     General: No focal deficit present.     Mental Status: She is alert and oriented to person, place, and time.     Cranial Nerves: No cranial nerve deficit.     Sensory: No sensory deficit.     Motor: No weakness.     Gait: Gait normal.     Comments: Mental Status:    Alert, thought content appropriate, able to give a coherent history. Speech fluent without evidence of aphasia. Able to follow 2 step commands without difficulty.  Cranial Nerves:  II:  Peripheral visual fields grossly normal, pupils equal, round, reactive to light III,IV, VI: ptosis not present, extra-ocular motions intact bilaterally  V,VII: smile symmetric, facial light touch sensation equal VIII: hearing grossly normal to voice  X: uvula elevates symmetrically  XI: bilateral shoulder shrug symmetric and strong XII: midline tongue extension without fassiculations Motor:  Normal tone. 5/5 strength of BUE and BLE major muscle groups including strong and equal grip strength and dorsiflexion/plantar flexion, no pronator drift.  Sensory: light touch normal in all extremities. Cerebellar: normal finger-to-nose with bilateral upper extremities Gait: normal gait and balance. Unable to heel walk or toe walk at baseline. Reports she feels as though she is having difficulty ambulating Unable to assess Romberg's sign as patient reports feeling very unsteady when she closes her eyes.    Psychiatric:        Behavior: Behavior normal.      ED Treatments / Results  Labs (all labs ordered are listed, but only abnormal results are displayed) Labs Reviewed  COMPREHENSIVE METABOLIC PANEL - Abnormal; Notable for the following components:      Result Value   Glucose, Bld 117 (*)    All other components within normal limits  URINALYSIS, ROUTINE W REFLEX MICROSCOPIC - Abnormal; Notable for the following components:   APPearance HAZY (*)    Ketones, ur 5 (*)    Leukocytes, UA MODERATE (*)    Bacteria, UA RARE (*)    All other components within normal limits  URINE CULTURE  PROTIME-INR  APTT  CBC  DIFFERENTIAL  RAPID URINE DRUG SCREEN, HOSP PERFORMED    EKG EKG Interpretation  Date/Time:  Saturday February 20 2018 15:56:24  EST Ventricular Rate:  89 PR Interval:    QRS Duration: 93 QT  Interval:  364 QTC Calculation: 443 R Axis:   84 Text Interpretation:  Sinus rhythm Left atrial enlargement Borderline right axis deviation Borderline ECG Confirmed by Carmin Muskrat [phone]) on 02/20/2018 3:59:47 PM   Radiology Ct Head Wo Contrast  Result Date: 02/20/2018 CLINICAL DATA:  Confusion. EXAM: CT HEAD WITHOUT CONTRAST TECHNIQUE: Contiguous axial images were obtained from the base of the skull through the vertex without intravenous contrast. COMPARISON:  CT scan of July 27, 2012. FINDINGS: Brain: Mild chronic ischemic white matter disease is noted. Minimal diffuse cortical atrophy is noted. No mass effect or midline shift is noted. Ventricular size is within normal limits. There is no evidence of mass lesion, hemorrhage or acute infarction. Vascular: No hyperdense vessel or unexpected calcification. Skull: Normal. Negative for fracture or focal lesion. Sinuses/Orbits: Left sphenoid sinusitis is noted. Other: None. IMPRESSION: Mild chronic ischemic white matter disease. Minimal diffuse cortical atrophy. No acute intracranial abnormality seen. Electronically Signed   By: Marijo Conception, M.D.   On: 02/20/2018 19:07    Procedures Procedures (including critical care time)  Medications Ordered in ED Medications - No data to display   Initial Impression / Assessment and Plan / ED Course  I have reviewed the triage vital signs and the nursing notes.  Pertinent labs & imaging results that were available during my care of the patient were reviewed by me and considered in my medical decision making (see chart for details).     Patient presents for evaluation of feeling of disequilibrium and numbness across her forehead.  She is afebrile, somewhat hypertensive in the ED.  Vital signs otherwise stable.  She is nontoxic in appearance.  She reports that she is feeling much better upon my assessment and has no focal neurologic deficits.  However, when she was ambulated she felt as though she was  having some difficulty walking though exhibited normal gait and balance.  Also noted some feeling unsteady with her eyes closed.  EKG shows normal sinus rhythm.  Lab work reviewed by me shows no leukocytosis, no anemia, no metabolic derangements.  Head CT shows mild chronic ischemic white matter disease with minimal diffuse cortical atrophy, no acute intracranial abnormality.  On reassessment the patient reports that she is feeling better.  She does not wish to stay any longer that we discussed the risks of discharge without obtaining an MRI.  She reports that she feels very claustrophobic with an MRI and will not tolerate it.  She understands that leaving prior to full assessment could result in morbidity mortality and misdiagnosis.  Primarily our concern is for a small vessel or posterior circulation stroke.  She is exhibiting good capacity to make this decision.  Discussion witnessed by husband.  Of note, her UA was equivocal for UTI.  She has no urinary symptoms.  We will culture.  She will call her PCP first thing tomorrow morning to set up an appointment for follow-up and possible outpatient MRI.  Discussed strict ED return precautions.  Patient and husband verbalized understanding of and agreement with plan and patient appears reasonably stabilized for discharge home at this time.  Patient was seen and evaluate Dr. Vanita Panda who agrees with assessment and plan at this time  Final Clinical Impressions(s) / ED Diagnoses   Final diagnoses:  Neurological symptoms    ED Discharge Orders    None       Thayden Lemire A, PA-C  02/20/18 2054    Carmin Muskrat, MD 02/21/18 [phone]

## 2018-02-20 NOTE — ED Notes (Signed)
Difficulty with getting IV and blood IV team at bedside

## 2018-02-20 NOTE — ED Triage Notes (Addendum)
Patient arrived from [place] via [place] with reports of confusion(resolved), trouble walking, and feeling that her swallowing was "not normal" while she was at [employer] with spouse at 1:30pm She reports feeling "fine now, but feel foggy brain."

## 2018-02-20 NOTE — Discharge Instructions (Signed)
Call your primary care physician's office to set up an outpatient MRI.  Take your medicines as prescribed.  Return to the emergency department if any concerning signs or symptoms develop such as weakness, altered mental status, slurred speech, facial droop, dizziness, persistent vomiting, chest pains, or shortness of breath.

## 2018-02-22 LAB — URINE CULTURE: Culture: <10000 — AB

## 2018-02-23 DIAGNOSIS — R42 Dizziness and giddiness: Secondary | ICD-10-CM | POA: Diagnosis not present

## 2018-02-23 DIAGNOSIS — R410 Disorientation, unspecified: Secondary | ICD-10-CM | POA: Diagnosis not present

## 2018-02-25 ENCOUNTER — Other Ambulatory Visit: Payer: Self-pay | Admitting: Family Medicine

## 2018-02-25 DIAGNOSIS — R410 Disorientation, unspecified: Secondary | ICD-10-CM

## 2018-02-26 ENCOUNTER — Other Ambulatory Visit: Payer: Self-pay | Admitting: Family Medicine

## 2018-02-26 DIAGNOSIS — R410 Disorientation, unspecified: Secondary | ICD-10-CM

## 2018-03-01 ENCOUNTER — Encounter: Payer: Self-pay | Admitting: Cardiovascular Disease

## 2018-03-01 ENCOUNTER — Ambulatory Visit (INDEPENDENT_AMBULATORY_CARE_PROVIDER_SITE_OTHER): Payer: Medicare Other | Admitting: Cardiovascular Disease

## 2018-03-01 VITALS — BP 130/58 | HR 101 | Ht 65.0 in | Wt 153.0 lb

## 2018-03-01 DIAGNOSIS — I5032 Chronic diastolic (congestive) heart failure: Secondary | ICD-10-CM

## 2018-03-01 DIAGNOSIS — I10 Essential (primary) hypertension: Secondary | ICD-10-CM | POA: Diagnosis not present

## 2018-03-01 NOTE — Patient Instructions (Signed)
Medication Instructions:  Your physician recommends that you continue on your current medications as directed. Please refer to the Current Medication list given to you today.  If you need a refill on your cardiac medications before your next appointment, please call your pharmacy.   Lab work: None ordered  If you have labs (blood work) drawn today and your tests are completely normal, you will receive your results only by: . MyChart Message (if you have MyChart) OR . A paper copy in the mail If you have any lab test that is abnormal or we need to change your treatment, we will call you to review the results.  Testing/Procedures: None ordered   Follow-Up: At [facility], you and your health needs are our priority.  As part of our continuing mission to provide you with exceptional heart care, we have created designated Provider Care Teams.  These Care Teams include your primary Cardiologist (physician) and Advanced Practice Providers (APPs -  Physician Assistants and Nurse Practitioners) who all work together to provide you with the care you need, when you need it. You will need a follow up appointment in:  12 months.  Please call our office 2 months in advance to schedule this appointment.  You may see Philip [name], MD or one of the following Advanced Practice Providers on your designated Care Team: Scott Weaver, PA-C Vin Bhagat, PA-C . Janine Hammond, NP  Any Other Special Instructions Will Be Listed Below (If Applicable).    

## 2018-03-01 NOTE — Progress Notes (Signed)
Cardiology Office Note   Date:  03/01/2018   ID:  [name] 07-17-1921, MRN [id number]  PCP:  Leighton Ruff, MD  Cardiologist:   Mertie Moores, MD   Chief Complaint  Patient presents with  . Congestive Heart Failure  . Hyperlipidemia        [name] is a 85 y.o. female who presents for   1. Hypertension 2. Hyperlipidemia 3. Hypothyroidism  Notes prior to 2014:   [name] is an 85 year old female with a history of diastolic dysfunction, mild aortic stenosis and hypothyroidism. She also has a history of tachybradycardia syndrome. She's had a stress Myoview study in May of 2010 which was normal. She has an ejection fraction of 75%. There was no ischemia. She had an echocardiogram performed in September, 2001 which revealed normal left ventricular systolic function with moderate left ventricular hypertrophy and mild aortic stenosis.  She presents today for followup visit. She does not have any complaints. She denies any chest pain or shortness of breath.   Sept. 12, 2014:  [name] is doing well now. She has had some dizziness and had several episodes of near syncope. Both episodes occurred while she was sitting. She was diagnosed with vertigo and was given meclizine. 24 hour Holter revealed NSR and 1 PVC.  We have an echocardiogram from October, 2013 which revealed normal left ventricular systolic function. She has mild aortic stenosis.  March 23, 2013:  She was last seen in Sept. 2014. Her BP has been a bit more variable. Her blood pressure varies between 150s to 120s. She does not eat any extra salt but she eats at the regular food bar at [facility] - her husband eats at the heart healthy food bar.   Sept. 8, 2015:  She is being more careful with her salt intake. She is feeling well. She has some DOE.  She was admitted to the hospital last month with some shortness breath. Her troponin levels were negative.  Echocardiogram revealed mild aortic stenosis.  Left ventricle: The cavity size was normal. Wall thickness was increased in a pattern of mild LVH. Systolic function was normal. The estimated ejection fraction was in the range of 55% to 60%. Doppler parameters are consistent with abnormal left ventricular relaxation (grade 1 diastolic dysfunction). - Aortic valve: Moderately calcified annulus. There was mild stenosis. There was trivial regurgitation. Valve area (VTI): 1.32 cm^2. Valve area (Vmax): 1.46 cm^2. Valve area (Vmean): 1.34 cm^2. - Mitral valve: Calcified annulus. - Left atrium: The atrium was mildly dilated. - Atrial septum: No defect or patent foramen ovale was identified  Oct. 7, 2015:  Spell was recently hospitalized for diastolic dysfunction. She had Lasix added to her medical regimen. She was also found to be mildly hypothyroid and her Synthroid dose was increased. She's feeling better at this point. He apparently had some wheezing during her hospitalization and was prescribed a nebulizer. She has not been using the nebulizer and feels well.  Jan. 21, 2016:  [name] is seen back today for her diastolic dysfunction.  Breathing is ok.  Was in the Oct. For a right arm fracture ,  She had surgery.    Still very sore.    August 01, 2015:  [name] is seen back for follow up visit Doing well Still has some DOE with activity.   Does her own shopping ,  Keeps house ( husband helps) Walks with a cane on occasion   Jan.  9, 2018:  Is doing very well - best in a long time .  No CP or worsening dyspnea.   July 30, 2016:  [name] is seen back for follow up visit Doing great for 89. No significant dyspnea Uses her inhaler several times a week which helps   Feb. 12, 2019:  Feels well  typical aches and pains in joints  Still gets out of breath with activities - does housework  No CP, no syncope  Is on Amiodarone 200 mg once a week - is maintaining NSR   Feb. 17,  2020:  Seen with her 13 yo husband,    [name] is seen today for follow-up of her chronic diastolic congestive heart failure.  She also has a history of tension and paroxysmal atrial fibrillation.  Doing well    Past Medical History:  Diagnosis Date  . Anxiety   . Aortic stenosis    a. mild by echo 2017.  . Arthritis   . Bradycardia    a. ? details not clear - in the past has had PVCs which have lowered effective HR count by pulse check.  . Chronic diastolic CHF (congestive heart failure) ([place])   . Diastolic dysfunction    mild   . Frequent PVCs    a. 48 Hour Holter 10/08/15 showed NSR and sinus tach with frequent PVCs, occasional bigeminy, total PVC burden 16.5%, lowest HR 59.  [name] Hyperlipidemia   . Hypertension   . Hypertensive heart disease   . Hypothyroidism   . Kidney stones   . Mild mitral regurgitation by prior echocardiogram   . Tachycardia    history of tachycardia and bradycardia    Past Surgical History:  Procedure Laterality Date  . APPENDECTOMY    . CHOLECYSTECTOMY    . EXTRACORPOREAL SHOCK WAVE LITHOTRIPSY    . EYE SURGERY Bilateral    cataract surgery with lens implant  . ORIF HUMERUS FRACTURE Right 10/31/2013   Procedure: Open Reduction Internal Fixation Right Proximal Humerus Fracture;  Surgeon: Marybelle Killings, MD;  Location: [place];  Service: Orthopedics;  Laterality: Right;  . TONSILLECTOMY    . TOTAL ABDOMINAL HYSTERECTOMY       Current Outpatient Medications  Medication Sig Dispense Refill  . acetaminophen (TYLENOL) 500 MG tablet Take 500-1,000 mg by mouth every 6 (six) hours as needed (for pain or headaches).    . ALPRAZolam (XANAX) 0.25 MG tablet Take 0.125 mg by mouth daily as needed for anxiety.     [name] amiodarone (PACERONE) 200 MG tablet Take 1 tablet (200 mg total) by mouth once a week. 45 tablet 3  . amLODipine (NORVASC) 5 MG tablet Take 1 tablet (5 mg total) by mouth daily. 30 tablet 0  . cholecalciferol (VITAMIN D) 1000 units tablet Take  2,000 Units by mouth daily.     . furosemide (LASIX) 20 MG tablet Take 10 mg by mouth daily.     . hydrocortisone 2.5 % ointment     . levothyroxine (SYNTHROID, LEVOTHROID) 25 MCG tablet Take 12.5 mcg by mouth daily before breakfast. Pt takes with a 91mcg tablet.  2  . levothyroxine (SYNTHROID, LEVOTHROID) 50 MCG tablet Take 50 mcg by mouth daily. Pt takes with one-half of a 13mcg tablet.  0  . meloxicam (MOBIC) 7.5 MG tablet     . Multiple Vitamins-Minerals (PRESERVISION AREDS 2+MULTI VIT) CAPS Take 1 capsule by mouth 2 (two) times daily.    . pravastatin (PRAVACHOL) 20 MG tablet Take 20  mg by mouth at bedtime.     . sertraline (ZOLOFT) 25 MG tablet Take 25 mg by mouth daily.      No current facility-administered medications for this visit.     Allergies:   Patient has no known allergies.    Social History:  The patient  reports that she has never smoked. She has never used smokeless tobacco. She reports that she does not drink alcohol or use drugs.   Family History:  The patient's family history includes Cancer in her brother; Emphysema in her father; Heart attack in her brother; Heart failure in her mother; Stroke in her brother.    ROS:  Please see the history of present illness.   Otherwise, review of systems are positive for none.   All other systems are reviewed and negative.    Physical Exam: Blood pressure (!) 130/58, pulse (!) 101, height 5\' 5"  (1.651 m), weight 153 lb (69.4 kg), SpO2 96 %.  GEN:  Elderly female, ,  NAD  HEENT: Normal NECK: No JVD; No carotid bruits LYMPHATICS: No lymphadenopathy CARDIAC: RRR ,  Softy systolic mumrur  RESPIRATORY:  Clear to auscultation without rales, wheezing or rhonchi  ABDOMEN: Soft, non-tender, non-distended MUSCULOSKELETAL:  No edema; No deformity  SKIN: Warm and dry NEUROLOGIC:  Alert and oriented x 3     EKG:      March 01, 2018: Sinus tachycardia at a rate of 101.  Otherwise normal EKG.  Recent Labs: 02/20/2018: ALT 14;  BUN 12; Creatinine, Ser 0.69; Hemoglobin 12.2; Platelets 336; Potassium 4.1; Sodium 140    Lipid Panel    Component Value Date/Time   CHOL 184 02/24/2017 1004   TRIG 179 (H) 02/24/2017 1004   HDL 53 02/24/2017 1004   CHOLHDL 3.5 02/24/2017 1004   CHOLHDL 3.3 10/29/2011 0829   VLDL 24 10/29/2011 0829   LDLCALC 95 02/24/2017 1004      Wt Readings from Last 3 Encounters:  03/01/18 153 lb (69.4 kg)  02/20/18 150 lb (68 kg)  02/24/17 153 lb 6.4 oz (69.6 kg)      Other studies Reviewed: Additional studies/ records that were reviewed today include: records and labs from medical . Review of the above records demonstrates: normal creatinine and potassium    ASSESSMENT AND PLAN:  1. Hypertension-      BP is well controlled.   2. Hyperlipidemia -   Stable  , no additional recs.   3. Hypothyroidism-        4. Chronic diastolic dysfunction.  - she still eats some additional salty foods.   Have advised her to avoid bacon, ham and breakfast meats.   5. Left carotid bruit:   -  Stable,   6.   Systolic murmur :   Has   Mild AS.   Also has mild AI, mild MR, mild TR   I'll see her again in 1 year     Current medicines are reviewed at length with the patient today.  The patient does not have concerns regarding medicines.  The following changes have been made:  no change  Labs/ tests ordered today include:   No orders of the defined types were placed in this encounter.    Signed, Mertie Moores, MD  03/01/2018 2:32 PM    [facility] [address], [place], [place]  [address] Phone: [phone]; Fax: [phone]

## 2018-03-02 ENCOUNTER — Ambulatory Visit
Admission: RE | Admit: 2018-03-02 | Discharge: 2018-03-02 | Disposition: A | Discharge status: Home / Self Care | Payer: Medicare Other | Source: Ambulatory Visit | Attending: Family Medicine | Admitting: Family Medicine

## 2018-03-02 DIAGNOSIS — I6523 Occlusion and stenosis of bilateral carotid arteries: Secondary | ICD-10-CM | POA: Diagnosis not present

## 2018-03-02 DIAGNOSIS — R410 Disorientation, unspecified: Secondary | ICD-10-CM

## 2018-03-08 ENCOUNTER — Ambulatory Visit
Admission: RE | Admit: 2018-03-08 | Discharge: 2018-03-08 | Disposition: A | Discharge status: Home / Self Care | Payer: Medicare Other | Source: Ambulatory Visit | Attending: Family Medicine | Admitting: Family Medicine

## 2018-03-08 DIAGNOSIS — R410 Disorientation, unspecified: Secondary | ICD-10-CM | POA: Diagnosis not present

## 2018-03-08 MED ORDER — GADOBENATE DIMEGLUMINE 529 MG/ML IV SOLN
15.0000 mL | Freq: Once | INTRAVENOUS | Status: AC | PRN
  Administered 2018-03-08: 15 mL via INTRAVENOUS

## 2018-03-10 DIAGNOSIS — L570 Actinic keratosis: Secondary | ICD-10-CM | POA: Diagnosis not present

## 2018-06-14 DIAGNOSIS — F419 Anxiety disorder, unspecified: Secondary | ICD-10-CM | POA: Diagnosis not present

## 2018-06-14 DIAGNOSIS — M810 Age-related osteoporosis without current pathological fracture: Secondary | ICD-10-CM | POA: Diagnosis not present

## 2018-06-14 DIAGNOSIS — I5032 Chronic diastolic (congestive) heart failure: Secondary | ICD-10-CM | POA: Diagnosis not present

## 2018-06-14 DIAGNOSIS — F339 Major depressive disorder, recurrent, unspecified: Secondary | ICD-10-CM | POA: Diagnosis not present

## 2018-06-14 DIAGNOSIS — I10 Essential (primary) hypertension: Secondary | ICD-10-CM | POA: Diagnosis not present

## 2018-06-14 DIAGNOSIS — E7800 Pure hypercholesterolemia, unspecified: Secondary | ICD-10-CM | POA: Diagnosis not present

## 2018-06-14 DIAGNOSIS — I509 Heart failure, unspecified: Secondary | ICD-10-CM | POA: Diagnosis not present

## 2018-06-14 DIAGNOSIS — E039 Hypothyroidism, unspecified: Secondary | ICD-10-CM | POA: Diagnosis not present

## 2018-10-28 DIAGNOSIS — Z23 Encounter for immunization: Secondary | ICD-10-CM | POA: Diagnosis not present

## 2018-11-03 DIAGNOSIS — D485 Neoplasm of uncertain behavior of skin: Secondary | ICD-10-CM | POA: Diagnosis not present

## 2018-11-03 DIAGNOSIS — L853 Xerosis cutis: Secondary | ICD-10-CM | POA: Diagnosis not present

## 2018-11-03 DIAGNOSIS — C44729 Squamous cell carcinoma of skin of left lower limb, including hip: Secondary | ICD-10-CM | POA: Diagnosis not present

## 2018-11-03 DIAGNOSIS — Z23 Encounter for immunization: Secondary | ICD-10-CM | POA: Diagnosis not present

## 2018-11-03 DIAGNOSIS — L304 Erythema intertrigo: Secondary | ICD-10-CM | POA: Diagnosis not present

## 2018-11-03 DIAGNOSIS — L821 Other seborrheic keratosis: Secondary | ICD-10-CM | POA: Diagnosis not present

## 2018-11-03 DIAGNOSIS — Z85828 Personal history of other malignant neoplasm of skin: Secondary | ICD-10-CM | POA: Diagnosis not present

## 2018-11-03 DIAGNOSIS — L570 Actinic keratosis: Secondary | ICD-10-CM | POA: Diagnosis not present

## 2018-11-30 DIAGNOSIS — Z961 Presence of intraocular lens: Secondary | ICD-10-CM | POA: Diagnosis not present

## 2018-11-30 DIAGNOSIS — H353132 Nonexudative age-related macular degeneration, bilateral, intermediate dry stage: Secondary | ICD-10-CM | POA: Diagnosis not present

## 2018-11-30 DIAGNOSIS — H43813 Vitreous degeneration, bilateral: Secondary | ICD-10-CM | POA: Diagnosis not present

## 2018-11-30 DIAGNOSIS — H524 Presbyopia: Secondary | ICD-10-CM | POA: Diagnosis not present

## 2018-12-01 DIAGNOSIS — M810 Age-related osteoporosis without current pathological fracture: Secondary | ICD-10-CM | POA: Diagnosis not present

## 2018-12-01 DIAGNOSIS — I10 Essential (primary) hypertension: Secondary | ICD-10-CM | POA: Diagnosis not present

## 2018-12-01 DIAGNOSIS — E7800 Pure hypercholesterolemia, unspecified: Secondary | ICD-10-CM | POA: Diagnosis not present

## 2018-12-01 DIAGNOSIS — I5032 Chronic diastolic (congestive) heart failure: Secondary | ICD-10-CM | POA: Diagnosis not present

## 2018-12-01 DIAGNOSIS — F339 Major depressive disorder, recurrent, unspecified: Secondary | ICD-10-CM | POA: Diagnosis not present

## 2018-12-01 DIAGNOSIS — E039 Hypothyroidism, unspecified: Secondary | ICD-10-CM | POA: Diagnosis not present

## 2018-12-01 DIAGNOSIS — I509 Heart failure, unspecified: Secondary | ICD-10-CM | POA: Diagnosis not present

## 2018-12-14 DIAGNOSIS — Z0000 Encounter for general adult medical examination without abnormal findings: Secondary | ICD-10-CM | POA: Diagnosis not present

## 2019-01-17 DIAGNOSIS — Z23 Encounter for immunization: Secondary | ICD-10-CM | POA: Diagnosis not present

## 2019-01-27 DIAGNOSIS — E039 Hypothyroidism, unspecified: Secondary | ICD-10-CM | POA: Diagnosis not present

## 2019-01-27 DIAGNOSIS — F339 Major depressive disorder, recurrent, unspecified: Secondary | ICD-10-CM | POA: Diagnosis not present

## 2019-01-27 DIAGNOSIS — I10 Essential (primary) hypertension: Secondary | ICD-10-CM | POA: Diagnosis not present

## 2019-01-27 DIAGNOSIS — M810 Age-related osteoporosis without current pathological fracture: Secondary | ICD-10-CM | POA: Diagnosis not present

## 2019-01-27 DIAGNOSIS — I509 Heart failure, unspecified: Secondary | ICD-10-CM | POA: Diagnosis not present

## 2019-01-27 DIAGNOSIS — I5032 Chronic diastolic (congestive) heart failure: Secondary | ICD-10-CM | POA: Diagnosis not present

## 2019-01-27 DIAGNOSIS — E7800 Pure hypercholesterolemia, unspecified: Secondary | ICD-10-CM | POA: Diagnosis not present

## 2019-02-14 DIAGNOSIS — Z23 Encounter for immunization: Secondary | ICD-10-CM | POA: Diagnosis not present

## 2019-02-23 DIAGNOSIS — E7800 Pure hypercholesterolemia, unspecified: Secondary | ICD-10-CM | POA: Diagnosis not present

## 2019-02-23 DIAGNOSIS — F339 Major depressive disorder, recurrent, unspecified: Secondary | ICD-10-CM | POA: Diagnosis not present

## 2019-02-23 DIAGNOSIS — I10 Essential (primary) hypertension: Secondary | ICD-10-CM | POA: Diagnosis not present

## 2019-02-23 DIAGNOSIS — I509 Heart failure, unspecified: Secondary | ICD-10-CM | POA: Diagnosis not present

## 2019-02-23 DIAGNOSIS — E039 Hypothyroidism, unspecified: Secondary | ICD-10-CM | POA: Diagnosis not present

## 2019-02-23 DIAGNOSIS — M810 Age-related osteoporosis without current pathological fracture: Secondary | ICD-10-CM | POA: Diagnosis not present

## 2019-02-23 DIAGNOSIS — I5032 Chronic diastolic (congestive) heart failure: Secondary | ICD-10-CM | POA: Diagnosis not present

## 2019-04-22 ENCOUNTER — Encounter: Payer: Self-pay | Admitting: Cardiovascular Disease

## 2019-04-22 ENCOUNTER — Ambulatory Visit (INDEPENDENT_AMBULATORY_CARE_PROVIDER_SITE_OTHER): Payer: Medicare Other | Admitting: Cardiovascular Disease

## 2019-04-22 DIAGNOSIS — Z5329 Procedure and treatment not carried out because of patient's decision for other reasons: Secondary | ICD-10-CM | POA: Insufficient documentation

## 2019-04-22 DIAGNOSIS — Z91199 Patient's noncompliance with other medical treatment and regimen due to unspecified reason: Secondary | ICD-10-CM | POA: Insufficient documentation

## 2019-04-22 NOTE — Progress Notes (Signed)
No show

## 2019-05-17 DIAGNOSIS — H903 Sensorineural hearing loss, bilateral: Secondary | ICD-10-CM | POA: Diagnosis not present

## 2019-05-30 DIAGNOSIS — I509 Heart failure, unspecified: Secondary | ICD-10-CM | POA: Diagnosis not present

## 2019-05-30 DIAGNOSIS — E7800 Pure hypercholesterolemia, unspecified: Secondary | ICD-10-CM | POA: Diagnosis not present

## 2019-05-30 DIAGNOSIS — F339 Major depressive disorder, recurrent, unspecified: Secondary | ICD-10-CM | POA: Diagnosis not present

## 2019-05-30 DIAGNOSIS — I10 Essential (primary) hypertension: Secondary | ICD-10-CM | POA: Diagnosis not present

## 2019-05-30 DIAGNOSIS — M810 Age-related osteoporosis without current pathological fracture: Secondary | ICD-10-CM | POA: Diagnosis not present

## 2019-05-30 DIAGNOSIS — I5032 Chronic diastolic (congestive) heart failure: Secondary | ICD-10-CM | POA: Diagnosis not present

## 2019-05-30 DIAGNOSIS — E039 Hypothyroidism, unspecified: Secondary | ICD-10-CM | POA: Diagnosis not present

## 2019-06-07 ENCOUNTER — Other Ambulatory Visit: Payer: Self-pay

## 2019-06-07 ENCOUNTER — Encounter: Payer: Self-pay | Admitting: Cardiovascular Disease

## 2019-06-07 ENCOUNTER — Ambulatory Visit (INDEPENDENT_AMBULATORY_CARE_PROVIDER_SITE_OTHER): Payer: Medicare Other | Admitting: Cardiovascular Disease

## 2019-06-07 VITALS — BP 124/58 | HR 96 | Ht 65.54 in | Wt 142.2 lb

## 2019-06-07 DIAGNOSIS — I5032 Chronic diastolic (congestive) heart failure: Secondary | ICD-10-CM

## 2019-06-07 DIAGNOSIS — I10 Essential (primary) hypertension: Secondary | ICD-10-CM

## 2019-06-07 DIAGNOSIS — I119 Hypertensive heart disease without heart failure: Secondary | ICD-10-CM | POA: Diagnosis not present

## 2019-06-07 NOTE — Patient Instructions (Signed)
Medication Instructions:  Your physician recommends that you continue on your current medications as directed. Please refer to the Current Medication list given to you today.  *If you need a refill on your cardiac medications before your next appointment, please call your pharmacy*   Lab Work: None Ordered If you have labs (blood work) drawn today and your tests are completely normal, you will receive your results only by: [name] MyChart Message (if you have MyChart) OR . A paper copy in the mail If you have any lab test that is abnormal or we need to change your treatment, we will call you to review the results.   Testing/Procedures: None Ordered   Follow-Up: At [facility], you and your health needs are our priority.  As part of our continuing mission to provide you with exceptional heart care, we have created designated Provider Care Teams.  These Care Teams include your primary Cardiologist (physician) and Advanced Practice Providers (APPs -  Physician Assistants and Nurse Practitioners) who all work together to provide you with the care you need, when you need it.  We recommend signing up for the patient portal called "MyChart".  Sign up information is provided on this After Visit Summary.  MyChart is used to connect with patients for Virtual Visits (Telemedicine).  Patients are able to view lab/test results, encounter notes, upcoming appointments, etc.  Non-urgent messages can be sent to your provider as well.   To learn more about what you can do with MyChart, go to [email or web address].    Your next appointment:   1 year(s)  The format for your next appointment:   Either In Person or Virtual  Provider:   You may see Mertie Moores, MD or one of the following Advanced Practice Providers on your designated Care Team:    Richardson Dopp, PA-C  [place], [place]

## 2019-06-07 NOTE — Progress Notes (Signed)
Cardiology Office Note   Date:  06/07/2019   ID:  [name], [name] 1921/04/25, MRN [id number]  PCP:  Leighton Ruff, MD  Cardiologist:   Mertie Moores, MD   Chief Complaint  Patient presents with  . Congestive Heart Failure        [name] is a 86 y.o. female who presents for   1. Hypertension 2. Hyperlipidemia 3. Hypothyroidism  Notes prior to 2014:   Mrs. [name] is an 86 year old female with a history of diastolic dysfunction, mild aortic stenosis and hypothyroidism. She also has a history of tachybradycardia syndrome. She's had a stress Myoview study in May of 2010 which was normal. She has an ejection fraction of 75%. There was no ischemia. She had an echocardiogram performed in September, 2001 which revealed normal left ventricular systolic function with moderate left ventricular hypertrophy and mild aortic stenosis.  She presents today for followup visit. She does not have any complaints. She denies any chest pain or shortness of breath.   Sept. 12, 2014:  [name] is doing well now. She has had some dizziness and had several episodes of near syncope. Both episodes occurred while she was sitting. She was diagnosed with vertigo and was given meclizine. 24 hour Holter revealed NSR and 1 PVC.  We have an echocardiogram from October, 2013 which revealed normal left ventricular systolic function. She has mild aortic stenosis.  March 23, 2013:  She was last seen in Sept. 2014. Her BP has been a bit more variable. Her blood pressure varies between 150s to 120s. She does not eat any extra salt but she eats at the regular food bar at [facility] - her husband eats at the heart healthy food bar.   Sept. 8, 2015:  She is being more careful with her salt intake. She is feeling well. She has some DOE.  She was admitted to the hospital last month with some shortness breath. Her troponin levels were negative. Echocardiogram  revealed mild aortic stenosis.  Left ventricle: The cavity size was normal. Wall thickness was increased in a pattern of mild LVH. Systolic function was normal. The estimated ejection fraction was in the range of 55% to 60%. Doppler parameters are consistent with abnormal left ventricular relaxation (grade 1 diastolic dysfunction). - Aortic valve: Moderately calcified annulus. There was mild stenosis. There was trivial regurgitation. Valve area (VTI): 1.32 cm^2. Valve area (Vmax): 1.46 cm^2. Valve area (Vmean): 1.34 cm^2. - Mitral valve: Calcified annulus. - Left atrium: The atrium was mildly dilated. - Atrial septum: No defect or patent foramen ovale was identified  Oct. 7, 2015:  Spell was recently hospitalized for diastolic dysfunction. She had Lasix added to her medical regimen. She was also found to be mildly hypothyroid and her Synthroid dose was increased. She's feeling better at this point. He apparently had some wheezing during her hospitalization and was prescribed a nebulizer. She has not been using the nebulizer and feels well.  Jan. 21, 2016:  [name] is seen back today for her diastolic dysfunction.  Breathing is ok.  Was in the Oct. For a right arm fracture ,  She had surgery.    Still very sore.    August 01, 2015:  [name] is seen back for follow up visit Doing well Still has some DOE with activity.   Does her own shopping ,  Keeps house ( husband helps) Walks with a cane on occasion  Jan. 9, 2018:  Is doing very well - best in a long time .  No CP or worsening dyspnea.   July 30, 2016:  [name] is seen back for follow up visit Doing great for 89. No significant dyspnea Uses her inhaler several times a week which helps   Feb. 12, 2019:  Feels well  typical aches and pains in joints  Still gets out of breath with activities - does housework  No CP, no syncope  Is on Amiodarone 200 mg once a week - is maintaining NSR   Feb. 17, 2020:  Seen  with her 66 yo husband,    [name] is seen today for follow-up of her chronic diastolic congestive heart failure.  She also has a history of tension and paroxysmal atrial fibrillation.  Doing well   Jun 07, 2019:  [name] is seen today, along.  Is very anxious  No CP,   Uses inhalers on occasion     Past Medical History:  Diagnosis Date  . Anxiety   . Aortic stenosis    a. mild by echo 2017.  . Arthritis   . Bradycardia    a. ? details not clear - in the past has had PVCs which have lowered effective HR count by pulse check.  . Chronic diastolic CHF (congestive heart failure) ([place])   . Diastolic dysfunction    mild   . Frequent PVCs    a. 48 Hour Holter 10/08/15 showed NSR and sinus tach with frequent PVCs, occasional bigeminy, total PVC burden 16.5%, lowest HR 59.  [name] Hyperlipidemia   . Hypertension   . Hypertensive heart disease   . Hypothyroidism   . Kidney stones   . Mild mitral regurgitation by prior echocardiogram   . Tachycardia    history of tachycardia and bradycardia    Past Surgical History:  Procedure Laterality Date  . APPENDECTOMY    . CHOLECYSTECTOMY    . EXTRACORPOREAL SHOCK WAVE LITHOTRIPSY    . EYE SURGERY Bilateral    cataract surgery with lens implant  . ORIF HUMERUS FRACTURE Right 10/31/2013   Procedure: Open Reduction Internal Fixation Right Proximal Humerus Fracture;  Surgeon: Marybelle Killings, MD;  Location: [facility];  Service: Orthopedics;  Laterality: Right;  . TONSILLECTOMY    . TOTAL ABDOMINAL HYSTERECTOMY       Current Outpatient Medications  Medication Sig Dispense Refill  . acetaminophen (TYLENOL) 500 MG tablet Take 500-1,000 mg by mouth every 6 (six) hours as needed (for pain or headaches).    [name] albuterol (VENTOLIN HFA) 108 (90 Base) MCG/ACT inhaler Inhale 2 puffs into the lungs every 6 (six) hours as needed for wheezing or shortness of breath.    . ALPRAZolam (XANAX) 0.25 MG tablet Take 0.125 mg by mouth daily as needed for anxiety.     [name]  amLODipine (NORVASC) 5 MG tablet Take 1 tablet (5 mg total) by mouth daily. 30 tablet 0  . cholecalciferol (VITAMIN D) 1000 units tablet Take 2,000 Units by mouth daily.     . furosemide (LASIX) 20 MG tablet Take 10 mg by mouth daily.     [name] levothyroxine (SYNTHROID) 75 MCG tablet Take 75 mcg by mouth every morning.    . Multiple Vitamins-Minerals (PRESERVISION AREDS 2+MULTI VIT) CAPS Take 1 capsule by mouth 2 (two) times daily.    . pravastatin (PRAVACHOL) 20 MG tablet Take 20 mg by mouth at bedtime.     . sertraline (ZOLOFT) 25 MG tablet Take  25 mg by mouth daily.      No current facility-administered medications for this visit.    Allergies:   Patient has no known allergies.    Social History:  The patient  reports that she has never smoked. She has never used smokeless tobacco. She reports that she does not drink alcohol or use drugs.   Family History:  The patient's family history includes Cancer in her brother; Emphysema in her father; Heart attack in her brother; Heart failure in her mother; Stroke in her brother.    ROS:  Please see the history of present illness.   Otherwise, review of systems are positive for none.   All other systems are reviewed and negative.    Physical Exam: Blood pressure (!) 124/58, pulse 96, height 5' 5.54" (1.665 m), weight 142 lb 3.2 oz (64.5 kg), SpO2 97 %.  GEN:  Well nourished, well developed in no acute distress HEENT: Normal NECK: No JVD;  Moderate left carotid bruit   LYMPHATICS: No lymphadenopathy CARDIAC: RRR , 2/6 systolic murmur  RESPIRATORY:  Clear to auscultation without rales, wheezing or rhonchi  ABDOMEN: Soft, non-tender, non-distended MUSCULOSKELETAL:  No edema; No deformity  SKIN: Warm and dry NEUROLOGIC:  Alert and oriented x 3      EKG:      Jun 07, 2019: Normal sinus rhythm at 97 bpm.  No ST or T wave changes. Recent Labs: No results found for requested labs within last 8760 hours.    Lipid Panel    Component Value  Date/Time   CHOL 184 02/24/2017 1004   TRIG 179 (H) 02/24/2017 1004   HDL 53 02/24/2017 1004   CHOLHDL 3.5 02/24/2017 1004   CHOLHDL 3.3 10/29/2011 0829   VLDL 24 10/29/2011 0829   LDLCALC 95 02/24/2017 1004      Wt Readings from Last 3 Encounters:  06/07/19 142 lb 3.2 oz (64.5 kg)  03/01/18 153 lb (69.4 kg)  02/20/18 150 lb (68 kg)      Other studies Reviewed: Additional studies/ records that were reviewed today include: records and labs from medical . Review of the above records demonstrates: normal creatinine and potassium    ASSESSMENT AND PLAN:  1. Hypertension-      BP is well controlled   2. Hyperlipidemia -    Stable   3. Hypothyroidism-        4. Chronic diastolic dysfunction.  - she still eats some additional salty foods.   Have advised her to avoid bacon, ham and breakfast meats.   5. Left carotid bruit:   -  Sounds the same   6.   Systolic murmur :   Has mild AS    I'll see her again in 1 year     Current medicines are reviewed at length with the patient today.  The patient does not have concerns regarding medicines.  The following changes have been made:  no change  Labs/ tests ordered today include:   No orders of the defined types were placed in this encounter.    Signed, Mertie Moores, MD  06/07/2019 4:28 PM    [facility] [place], [place],   [address] Phone: [phone]; Fax: [phone]

## 2019-06-08 DIAGNOSIS — R198 Other specified symptoms and signs involving the digestive system and abdomen: Secondary | ICD-10-CM | POA: Diagnosis not present

## 2019-07-26 ENCOUNTER — Encounter: Payer: Self-pay | Admitting: Orthopaedic Surgery

## 2019-07-26 ENCOUNTER — Ambulatory Visit (INDEPENDENT_AMBULATORY_CARE_PROVIDER_SITE_OTHER): Payer: Medicare Other

## 2019-07-26 ENCOUNTER — Ambulatory Visit (INDEPENDENT_AMBULATORY_CARE_PROVIDER_SITE_OTHER): Payer: Medicare Other | Admitting: Orthopaedic Surgery

## 2019-07-26 VITALS — BP 131/74 | HR 86 | Ht 65.5 in | Wt 142.0 lb

## 2019-07-26 DIAGNOSIS — M25552 Pain in left hip: Secondary | ICD-10-CM

## 2019-07-26 DIAGNOSIS — M25551 Pain in right hip: Secondary | ICD-10-CM | POA: Diagnosis not present

## 2019-07-26 DIAGNOSIS — M25562 Pain in left knee: Secondary | ICD-10-CM

## 2019-07-26 DIAGNOSIS — M25561 Pain in right knee: Secondary | ICD-10-CM

## 2019-07-26 DIAGNOSIS — M25559 Pain in unspecified hip: Secondary | ICD-10-CM

## 2019-07-26 MED ORDER — LIDOCAINE HCL 1 % IJ SOLN
0.5000 mL | INTRAMUSCULAR | Status: AC | PRN
  Administered 2019-07-26: .5 mL

## 2019-07-26 MED ORDER — METHYLPREDNISOLONE ACETATE 40 MG/ML IJ SUSP
40.0000 mg | INTRAMUSCULAR | Status: AC | PRN
  Administered 2019-07-26: 40 mg via INTRA_ARTICULAR

## 2019-07-26 MED ORDER — BUPIVACAINE HCL 0.5 % IJ SOLN
3.0000 mL | INTRAMUSCULAR | Status: AC | PRN
  Administered 2019-07-26: 3 mL via INTRA_ARTICULAR

## 2019-07-26 NOTE — Progress Notes (Signed)
Office Visit Note   Patient: [name]           Date of Birth: 12/11/1921           MRN: [id number] Visit Date: 07/26/2019              Requested by: Leighton Ruff, MD [place],  [place] [address] PCP: Leighton Ruff, MD   Assessment & Plan: Visit Diagnoses:  1. Pain in both knees, unspecified chronicity   2. Hip pain     Plan: Right left knee injection performed which she tolerated well.  She is able to ambulate with less pain postinjection.  Follow-up as needed.  X-ray results were reviewed with patient and her husband.  Follow-Up Instructions: No follow-ups on file.   Orders:  Orders Placed This Encounter  Procedures   Large Joint Inj: bilateral knee   XR Knee 1-2 Views Left   XR Knee 1-2 Views Right   XR HIPS BILAT W OR W/O PELVIS 3-4 VIEWS   No orders of the defined types were placed in this encounter.     Procedures: Large Joint Inj: bilateral knee on 07/26/2019 9:30 AM Indications: pain and joint swelling Details: 22 G 1.5 in needle, anterolateral approach  Arthrogram: No  Medications (Right): 0.5 mL lidocaine 1 %; 3 mL bupivacaine 0.5 %; 40 mg methylPREDNISolone acetate 40 MG/ML Medications (Left): 0.5 mL lidocaine 1 %; 3 mL bupivacaine 0.5 %; 40 mg methylPREDNISolone acetate 40 MG/ML Outcome: tolerated well, no immediate complications Procedure, treatment alternatives, risks and benefits explained, specific risks discussed. Consent was given by the patient. Immediately prior to procedure a time out was called to verify the correct patient, procedure, equipment, support staff and site/side marked as required. Patient was prepped and draped in the usual sterile fashion.       Clinical Data: No additional findings.   Subjective: Chief Complaint  Patient presents with   Left Knee - Pain   Right Knee - Pain   Right Hip - Pain   Left Hip - Pain    HPI 86 year old female seen with bilateral knee pain.  Previous surgery  by me more than 5 years ago for proximal right humerus fracture treated with ORIF doing well.  She states she has some aching in her shoulders some pain difficulty getting her arms up overhead primarily is having knee pain and some occasional low back pain and hip pain.  She is amatory with a cane but can ambulate in the office without the cane.  Her husband is on a walker and is with her today provide additional history.  Additional problems include chronic diastolic heart failure, hypertension hyperlipidemia.  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  She denies locking her knees occasional swelling aching both medial and lateral.  She avoids stairs.  Review of Systems All other systems ae negative as they pertain to HPI  Objective: Vital Signs: BP 131/74 (BP Location: Left Arm, Patient Position: Sitting)    Pulse 86    Ht 5' 5.5" (1.664 m)    Wt 142 lb (64.4 kg)    LMP  (LMP Unknown)    BMI 23.27 kg/m   Physical Exam Constitutional:      Appearance: She is well-developed.  HENT:     Head: Normocephalic.     Right Ear: External ear normal.     Left Ear: External ear normal.  Eyes:     Pupils: Pupils are equal, round, and reactive to light.  Neck:     Thyroid: No thyromegaly.     Trachea: No tracheal deviation.  Cardiovascular:     Rate and Rhythm: Normal rate.  Pulmonary:     Effort: Pulmonary effort is normal.  Abdominal:     Palpations: Abdomen is soft.  Skin:    General: Skin is warm and dry.  Neurological:     Mental Status: She is alert and oriented to person, place, and time.  Psychiatric:        Behavior: Behavior normal.     Ortho Exam negative logroll right and left hip.  Negative popliteal compression test.  Crepitus with knee range of motion bilaterally.  Both knees reach full extension she can flex 110 degrees.  Medial and lateral joint line tenderness.  Patient can ambulate short stride gait without significant limp minimal knee flexion with short stride.  Negative  Trendelenburg.  No tenderness over the greater trochanter area right and left  Specialty Comments:  No specialty comments available.  Imaging: XR HIPS BILAT W OR W/O PELVIS 3-4 VIEWS  Result Date: 07/26/2019 AP pelvis frog-leg x-rays reviewed.  This shows some mild joint space narrowing marginal osteophytes consistent with mild bilateral hip osteoarthritis.  Negative for acute bony injury.  Femoral necks are normal. Impression: Mild bilateral hip osteoarthritis.  XR Knee 1-2 Views Left  Result Date: 07/26/2019 Standing AP both knees lateral left knee obtained and reviewed.  This shows joint space narrowing marginal osteophytes and chondrocalcinosis. Impression: Moderate left knee osteoarthritis.  XR Knee 1-2 Views Right  Result Date: 07/26/2019 Standing AP both knees lateral right knee sunrise patellar x-ray obtained and reviewed.  This shows joint space narrowing marginal osteophytes and some chondrocalcinosis of the meniscus consistent with some moderate knee osteoarthritis. Impression: Moderate right knee osteoarthritis negative for acute changes.    PMFS History: Patient Active Problem List   Diagnosis Date Noted   No-show for appointment 04/22/2019   Frequent PVCs    Bradycardia    Hypertensive heart disease    Hyperlipidemia 02/02/2014   Fracture of humerus, proximal, right, closed 10/31/2013   Proximal humerus fracture 10/31/2013   UTI (urinary tract infection) 09/24/2013   Acute on chronic diastolic congestive heart failure ([facility]) 09/24/2013   Abnormal EKG 09/24/2013   Acute on chronic diastolic heart failure ([facility]) 09/24/2013   Infection of left eye 09/24/2013   Near syncope 09/24/2012   Hypothyroidism 10/30/2011   Chest pain 10/29/2011   Chronic diastolic CHF (congestive heart failure) ([facility]) 07/05/2010   Nonspecific (abnormal) findings on radiological and other examination of body structure 11/12/2009   CT, CHEST, ABNORMAL 11/12/2009   DYSPNEA ON  EXERTION 06/05/2009   Past Medical History:  Diagnosis Date   Anxiety    Aortic stenosis    a. mild by echo 2017.   Arthritis    Bradycardia    a. ? details not clear - in the past has had PVCs which have lowered effective HR count by pulse check.   Chronic diastolic CHF (congestive heart failure) ([facility])    Diastolic dysfunction    mild    Frequent PVCs    a. 48 Hour Holter 10/08/15 showed NSR and sinus tach with frequent PVCs, occasional bigeminy, total PVC burden 16.5%, lowest HR 59.   Hyperlipidemia    Hypertension    Hypertensive heart disease    Hypothyroidism    Kidney stones    Mild mitral regurgitation by prior echocardiogram    Tachycardia    history of tachycardia and  bradycardia    Family History  Problem Relation Age of Onset   Emphysema Father    Heart failure Mother    Heart attack Brother    Cancer Brother        kidney   Stroke Brother     Past Surgical History:  Procedure Laterality Date   APPENDECTOMY     CHOLECYSTECTOMY     EXTRACORPOREAL SHOCK WAVE LITHOTRIPSY     EYE SURGERY Bilateral    cataract surgery with lens implant   ORIF HUMERUS FRACTURE Right 10/31/2013   Procedure: Open Reduction Internal Fixation Right Proximal Humerus Fracture;  Surgeon: Marybelle Killings, MD;  Location: [facility];  Service: Orthopedics;  Laterality: Right;   TONSILLECTOMY     TOTAL ABDOMINAL HYSTERECTOMY     Social History   Occupational History   Not on file  Tobacco Use   Smoking status: Never Smoker   Smokeless tobacco: Never Used  Vaping Use   Vaping Use: Never used  Substance and Sexual Activity   Alcohol use: No   Drug use: No   Sexual activity: Not Currently

## 2019-09-08 DIAGNOSIS — I10 Essential (primary) hypertension: Secondary | ICD-10-CM | POA: Diagnosis not present

## 2019-09-08 DIAGNOSIS — F339 Major depressive disorder, recurrent, unspecified: Secondary | ICD-10-CM | POA: Diagnosis not present

## 2019-09-08 DIAGNOSIS — E7800 Pure hypercholesterolemia, unspecified: Secondary | ICD-10-CM | POA: Diagnosis not present

## 2019-09-08 DIAGNOSIS — I5032 Chronic diastolic (congestive) heart failure: Secondary | ICD-10-CM | POA: Diagnosis not present

## 2019-09-08 DIAGNOSIS — I509 Heart failure, unspecified: Secondary | ICD-10-CM | POA: Diagnosis not present

## 2019-09-08 DIAGNOSIS — E039 Hypothyroidism, unspecified: Secondary | ICD-10-CM | POA: Diagnosis not present

## 2019-09-08 DIAGNOSIS — M810 Age-related osteoporosis without current pathological fracture: Secondary | ICD-10-CM | POA: Diagnosis not present

## 2019-09-13 DIAGNOSIS — L219 Seborrheic dermatitis, unspecified: Secondary | ICD-10-CM | POA: Diagnosis not present

## 2019-09-13 DIAGNOSIS — L304 Erythema intertrigo: Secondary | ICD-10-CM | POA: Diagnosis not present

## 2019-10-17 DIAGNOSIS — Z23 Encounter for immunization: Secondary | ICD-10-CM | POA: Diagnosis not present

## 2019-11-15 DIAGNOSIS — F339 Major depressive disorder, recurrent, unspecified: Secondary | ICD-10-CM | POA: Diagnosis not present

## 2019-11-15 DIAGNOSIS — E039 Hypothyroidism, unspecified: Secondary | ICD-10-CM | POA: Diagnosis not present

## 2019-11-15 DIAGNOSIS — E7800 Pure hypercholesterolemia, unspecified: Secondary | ICD-10-CM | POA: Diagnosis not present

## 2019-11-15 DIAGNOSIS — F419 Anxiety disorder, unspecified: Secondary | ICD-10-CM | POA: Diagnosis not present

## 2019-11-15 DIAGNOSIS — I5032 Chronic diastolic (congestive) heart failure: Secondary | ICD-10-CM | POA: Diagnosis not present

## 2019-11-15 DIAGNOSIS — I10 Essential (primary) hypertension: Secondary | ICD-10-CM | POA: Diagnosis not present

## 2019-11-15 DIAGNOSIS — R198 Other specified symptoms and signs involving the digestive system and abdomen: Secondary | ICD-10-CM | POA: Diagnosis not present

## 2019-11-15 DIAGNOSIS — I509 Heart failure, unspecified: Secondary | ICD-10-CM | POA: Diagnosis not present

## 2019-11-22 DIAGNOSIS — Z23 Encounter for immunization: Secondary | ICD-10-CM | POA: Diagnosis not present

## 2019-11-29 ENCOUNTER — Telehealth: Payer: Self-pay

## 2019-11-29 NOTE — Telephone Encounter (Signed)
I called the pt in response to a letter that she sent Dr. Acie Fredrickson re: her Amiodarone. Dr. Drema Dallas her PCP had suggested that she reach out to [place] and ask if she should be taking the Amiodarone.   It was D/C on her med list may 2021 but nothing noted of why she was possibly take off of it.   Pt has been taking it inconsistently for months. She says she only takes it when her "head feels fuzzy" but denies palpitations, and dizziness,.  She has actually been out of it for about 3 weeks and will need a new RX if she is to continue.   Will need to forward to Dr. Acie Fredrickson for review.   Pt not due for follow up until 05/2020.

## 2019-11-29 NOTE — Telephone Encounter (Signed)
It sounds like she only takes it on an "as needed / when she thinks she needs it " basis Lets DC  Will follow up with her in May, 2022

## 2019-11-30 DIAGNOSIS — F339 Major depressive disorder, recurrent, unspecified: Secondary | ICD-10-CM | POA: Diagnosis not present

## 2019-11-30 DIAGNOSIS — E7800 Pure hypercholesterolemia, unspecified: Secondary | ICD-10-CM | POA: Diagnosis not present

## 2019-11-30 DIAGNOSIS — M810 Age-related osteoporosis without current pathological fracture: Secondary | ICD-10-CM | POA: Diagnosis not present

## 2019-11-30 DIAGNOSIS — E039 Hypothyroidism, unspecified: Secondary | ICD-10-CM | POA: Diagnosis not present

## 2019-11-30 DIAGNOSIS — I509 Heart failure, unspecified: Secondary | ICD-10-CM | POA: Diagnosis not present

## 2019-11-30 DIAGNOSIS — I10 Essential (primary) hypertension: Secondary | ICD-10-CM | POA: Diagnosis not present

## 2019-11-30 DIAGNOSIS — I5032 Chronic diastolic (congestive) heart failure: Secondary | ICD-10-CM | POA: Diagnosis not present

## 2019-11-30 NOTE — Telephone Encounter (Signed)
Patient's husband notified  

## 2019-12-13 DIAGNOSIS — H26492 Other secondary cataract, left eye: Secondary | ICD-10-CM | POA: Diagnosis not present

## 2019-12-13 DIAGNOSIS — H43813 Vitreous degeneration, bilateral: Secondary | ICD-10-CM | POA: Diagnosis not present

## 2019-12-13 DIAGNOSIS — D3132 Benign neoplasm of left choroid: Secondary | ICD-10-CM | POA: Diagnosis not present

## 2019-12-13 DIAGNOSIS — H353132 Nonexudative age-related macular degeneration, bilateral, intermediate dry stage: Secondary | ICD-10-CM | POA: Diagnosis not present

## 2020-01-27 DIAGNOSIS — Z0000 Encounter for general adult medical examination without abnormal findings: Secondary | ICD-10-CM | POA: Diagnosis not present

## 2020-01-27 DIAGNOSIS — Z23 Encounter for immunization: Secondary | ICD-10-CM | POA: Diagnosis not present

## 2020-01-27 DIAGNOSIS — Z1389 Encounter for screening for other disorder: Secondary | ICD-10-CM | POA: Diagnosis not present

## 2020-02-21 DIAGNOSIS — D1801 Hemangioma of skin and subcutaneous tissue: Secondary | ICD-10-CM | POA: Diagnosis not present

## 2020-02-21 DIAGNOSIS — L570 Actinic keratosis: Secondary | ICD-10-CM | POA: Diagnosis not present

## 2020-02-21 DIAGNOSIS — L821 Other seborrheic keratosis: Secondary | ICD-10-CM | POA: Diagnosis not present

## 2020-02-21 DIAGNOSIS — L814 Other melanin hyperpigmentation: Secondary | ICD-10-CM | POA: Diagnosis not present

## 2020-03-09 DIAGNOSIS — E7800 Pure hypercholesterolemia, unspecified: Secondary | ICD-10-CM | POA: Diagnosis not present

## 2020-03-09 DIAGNOSIS — F339 Major depressive disorder, recurrent, unspecified: Secondary | ICD-10-CM | POA: Diagnosis not present

## 2020-03-09 DIAGNOSIS — M810 Age-related osteoporosis without current pathological fracture: Secondary | ICD-10-CM | POA: Diagnosis not present

## 2020-03-09 DIAGNOSIS — I509 Heart failure, unspecified: Secondary | ICD-10-CM | POA: Diagnosis not present

## 2020-03-09 DIAGNOSIS — E039 Hypothyroidism, unspecified: Secondary | ICD-10-CM | POA: Diagnosis not present

## 2020-03-09 DIAGNOSIS — I5032 Chronic diastolic (congestive) heart failure: Secondary | ICD-10-CM | POA: Diagnosis not present

## 2020-03-09 DIAGNOSIS — I10 Essential (primary) hypertension: Secondary | ICD-10-CM | POA: Diagnosis not present

## 2020-04-10 DIAGNOSIS — D3132 Benign neoplasm of left choroid: Secondary | ICD-10-CM | POA: Diagnosis not present

## 2020-04-10 DIAGNOSIS — H353122 Nonexudative age-related macular degeneration, left eye, intermediate dry stage: Secondary | ICD-10-CM | POA: Diagnosis not present

## 2020-04-19 DIAGNOSIS — I10 Essential (primary) hypertension: Secondary | ICD-10-CM | POA: Diagnosis not present

## 2020-04-19 DIAGNOSIS — E7800 Pure hypercholesterolemia, unspecified: Secondary | ICD-10-CM | POA: Diagnosis not present

## 2020-04-19 DIAGNOSIS — F339 Major depressive disorder, recurrent, unspecified: Secondary | ICD-10-CM | POA: Diagnosis not present

## 2020-04-19 DIAGNOSIS — I5032 Chronic diastolic (congestive) heart failure: Secondary | ICD-10-CM | POA: Diagnosis not present

## 2020-04-19 DIAGNOSIS — M810 Age-related osteoporosis without current pathological fracture: Secondary | ICD-10-CM | POA: Diagnosis not present

## 2020-04-19 DIAGNOSIS — E039 Hypothyroidism, unspecified: Secondary | ICD-10-CM | POA: Diagnosis not present

## 2020-04-19 DIAGNOSIS — I509 Heart failure, unspecified: Secondary | ICD-10-CM | POA: Diagnosis not present

## 2020-04-24 DIAGNOSIS — L570 Actinic keratosis: Secondary | ICD-10-CM | POA: Diagnosis not present

## 2020-04-24 DIAGNOSIS — L821 Other seborrheic keratosis: Secondary | ICD-10-CM | POA: Diagnosis not present

## 2020-04-24 DIAGNOSIS — L814 Other melanin hyperpigmentation: Secondary | ICD-10-CM | POA: Diagnosis not present

## 2020-04-24 DIAGNOSIS — L820 Inflamed seborrheic keratosis: Secondary | ICD-10-CM | POA: Diagnosis not present

## 2020-04-24 DIAGNOSIS — D229 Melanocytic nevi, unspecified: Secondary | ICD-10-CM | POA: Diagnosis not present

## 2020-05-11 ENCOUNTER — Ambulatory Visit (INDEPENDENT_AMBULATORY_CARE_PROVIDER_SITE_OTHER): Payer: Medicare Other

## 2020-05-11 ENCOUNTER — Ambulatory Visit (INDEPENDENT_AMBULATORY_CARE_PROVIDER_SITE_OTHER): Payer: Medicare Other | Admitting: Orthopaedic Surgery

## 2020-05-11 VITALS — BP 159/74 | HR 102

## 2020-05-11 DIAGNOSIS — M545 Low back pain, unspecified: Secondary | ICD-10-CM

## 2020-05-11 DIAGNOSIS — M25551 Pain in right hip: Secondary | ICD-10-CM

## 2020-05-11 DIAGNOSIS — M25552 Pain in left hip: Secondary | ICD-10-CM

## 2020-05-11 DIAGNOSIS — G8929 Other chronic pain: Secondary | ICD-10-CM

## 2020-05-11 DIAGNOSIS — M25511 Pain in right shoulder: Secondary | ICD-10-CM

## 2020-05-11 NOTE — Progress Notes (Signed)
Office Visit Note   Patient: [name]           Date of Birth: Mar 26, 1921           MRN: [id number] Visit Date: 05/11/2020              Requested by: Leighton Ruff, MD [place],  [place] [address] PCP: Leighton Ruff, MD   Assessment & Plan: Visit Diagnoses:  1. Chronic right shoulder pain   2. Chronic bilateral low back pain without sciatica   3. Bilateral hip pain     Plan: Patient gets relief with Tylenol which she can continue.  She also gets better if she stops and takes a rest for few minutes.  We discussed breaking up some of her housework activities and do a gradual amount and then resume it later on in the day or the following day.  She can return if she gets more isolated problems.  X-rays of her shoulder show satisfactory postop position and alignment without posttraumatic arthritis.  Lumbar spine x-rays are negative for compression fractures and shows satisfactory alignment.  Follow-Up Instructions: Return if symptoms worsen or fail to improve.   Orders:  Orders Placed This Encounter  Procedures  . XR HIPS BILAT W OR W/O PELVIS 2V  . XR Lumbar Spine 2-3 Views  . XR Shoulder Right   No orders of the defined types were placed in this encounter.     Procedures: No procedures performed   Clinical Data: No additional findings.   Subjective: No chief complaint on file.   HPI 87 year old female who I previously did ORIF right proximal humerus fracture 2015 is seen with some bilateral shoulder pain less range of motion of the right than left shoulder.  She has more back pain that comes and goes but states actually today it is better.  Past history of kidney stones, multiple but states this is not like kidney stone pain.  Pain gets worse when she does housework.  Patient is here with her husband Shanon Brow and they both live at [facility].  Patient states she gets some relief if she sits down and rests.  No associated bowel or bladder  symptoms.  Review of Systems positive history of heart failure hypertension proximal humerus fracture on the right previous UTIs.  No current chest pain.   Objective: Vital Signs: BP (!) 159/74   Pulse (!) 102   LMP  (LMP Unknown)   Physical Exam Constitutional:      Appearance: She is well-developed.  HENT:     Head: Normocephalic.     Right Ear: External ear normal.     Left Ear: External ear normal.     Ears:     Comments: Right hearing aid with decrease hearing acuity Eyes:     Pupils: Pupils are equal, round, and reactive to light.  Neck:     Thyroid: No thyromegaly.     Trachea: No tracheal deviation.  Cardiovascular:     Rate and Rhythm: Normal rate.  Pulmonary:     Effort: Pulmonary effort is normal.  Abdominal:     Palpations: Abdomen is soft.  Skin:    General: Skin is warm and dry.  Neurological:     Mental Status: She is alert and oriented to person, place, and time.  Psychiatric:        Behavior: Behavior normal.     Ortho Exam patient has 70% abduction and 60% flexion right shoulder compared to  the opposite left shoulder.  Mild prominence of the plate with some mild deltoid atrophy.  Biceps is in good position good elbow range of motion.  Internal and external rotation of her shoulder is nonpainful.  Sensation the hand is static intact.  No sciatic notch tenderness no trochanteric bursa tenderness negative logroll of the hips knees reach full extension. Specialty Comments:  No specialty comments available.  Imaging: XR HIPS BILAT W OR W/O PELVIS 2V  Result Date: 05/11/2020 AP pelvis, right and left AP hip and right left frog-leg x-rays were obtained reviewed this shows joint space narrowing mild spurring consistent with mild hip osteoarthritis.  Negative for acute fracture. Impression: Normal hip x-rays without significant degenerative change.  XR Lumbar Spine 2-3 Views  Result Date: 05/11/2020 AP lateral lumbar spine x-rays are obtained and reviewed  this shows some disc space narrowing mild with spurring without listhesis.  Old changes of left complaints of kidney stones noted on abdominal CT July 2019 unchanged.  Negative for compression fractures. Impression: Negative lumbar spine x-rays were chronic or acute conditions.  XR Shoulder Right  Result Date: 05/11/2020 2 view x-rays right shoulder obtained and reviewed.  This shows previous fixation proximal humerus fracture with slight shortening and minimal angulation. Impression: Previous proximal humerus fixation, right shoulder without posttraumatic arthritis.    PMFS History: Patient Active Problem List   Diagnosis Date Noted  . No-show for appointment 04/22/2019  . Frequent PVCs   . Bradycardia   . Hypertensive heart disease   . Hyperlipidemia 02/02/2014  . Fracture of humerus, proximal, right, closed 10/31/2013  . Proximal humerus fracture 10/31/2013  . UTI (urinary tract infection) 09/24/2013  . Acute on chronic diastolic congestive heart failure ([facility]) 09/24/2013  . Abnormal EKG 09/24/2013  . Acute on chronic diastolic heart failure ([facility]) 09/24/2013  . Infection of left eye 09/24/2013  . Near syncope 09/24/2012  . Hypothyroidism 10/30/2011  . Chest pain 10/29/2011  . Chronic diastolic CHF (congestive heart failure) ([facility]) 07/05/2010  . Nonspecific (abnormal) findings on radiological and other examination of body structure 11/12/2009  . CT, CHEST, ABNORMAL 11/12/2009  . DYSPNEA ON EXERTION 06/05/2009   Past Medical History:  Diagnosis Date  . Anxiety   . Aortic stenosis    a. mild by echo 2017.  . Arthritis   . Bradycardia    a. ? details not clear - in the past has had PVCs which have lowered effective HR count by pulse check.  . Chronic diastolic CHF (congestive heart failure) ([facility])   . Diastolic dysfunction    mild   . Frequent PVCs    a. 48 Hour Holter 10/08/15 showed NSR and sinus tach with frequent PVCs, occasional bigeminy, total PVC burden 16.5%, lowest HR  59.  [name] Hyperlipidemia   . Hypertension   . Hypertensive heart disease   . Hypothyroidism   . Kidney stones   . Mild mitral regurgitation by prior echocardiogram   . Tachycardia    history of tachycardia and bradycardia    Family History  Problem Relation Age of Onset  . Emphysema Father   . Heart failure Mother   . Heart attack Brother   . Cancer Brother        kidney  . Stroke Brother     Past Surgical History:  Procedure Laterality Date  . APPENDECTOMY    . CHOLECYSTECTOMY    . EXTRACORPOREAL SHOCK WAVE LITHOTRIPSY    . EYE SURGERY Bilateral    cataract surgery with lens  implant  . ORIF HUMERUS FRACTURE Right 10/31/2013   Procedure: Open Reduction Internal Fixation Right Proximal Humerus Fracture;  Surgeon: Marybelle Killings, MD;  Location: [facility];  Service: Orthopedics;  Laterality: Right;  . TONSILLECTOMY    . TOTAL ABDOMINAL HYSTERECTOMY     Social History   Occupational History  . Not on file  Tobacco Use  . Smoking status: Never Smoker  . Smokeless tobacco: Never Used  Vaping Use  . Vaping Use: Never used  Substance and Sexual Activity  . Alcohol use: No  . Drug use: No  . Sexual activity: Not Currently

## 2020-06-12 DIAGNOSIS — Z23 Encounter for immunization: Secondary | ICD-10-CM | POA: Diagnosis not present

## 2020-06-14 ENCOUNTER — Encounter: Payer: Self-pay | Admitting: Cardiovascular Disease

## 2020-06-14 NOTE — Progress Notes (Signed)
Cardiology Office Note   Date:  06/15/2020   ID:  [name], [name] 1921/05/28, MRN [id number]  PCP:  [name]  Cardiologist:   Diane Moores, MD   Chief Complaint  Patient presents with  . Congestive Heart Failure        [name] is a 87 y.o. female who presents for   1. Hypertension 2. Hyperlipidemia 3. Hypothyroidism  Notes prior to 2014:   Mrs. [name] is an 87 year old female with a history of diastolic dysfunction, mild aortic stenosis and hypothyroidism. She also has a history of tachybradycardia syndrome. She's had a stress Myoview study in May of 2010 which was normal. She has an ejection fraction of 75%. There was no ischemia. She had an echocardiogram performed in September, 2001 which revealed normal left ventricular systolic function with moderate left ventricular hypertrophy and mild aortic stenosis.  She presents today for followup visit. She does not have any complaints. She denies any chest pain or shortness of breath.   Sept. 12, 2014:  [name] is doing well now. She has had some dizziness and had several episodes of near syncope. Both episodes occurred while she was sitting. She was diagnosed with vertigo and was given meclizine. 24 hour Holter revealed NSR and 1 PVC.  We have an echocardiogram from October, 2013 which revealed normal left ventricular systolic function. She has mild aortic stenosis.  March 23, 2013:  She was last seen in Sept. 2014. Her BP has been a bit more variable. Her blood pressure varies between 150s to 120s. She does not eat any extra salt but she eats at the regular food bar at [facility] at [facility] - her husband eats at the heart healthy food bar.   Sept. 8, 2015:  She is being more careful with her salt intake. She is feeling well. She has some DOE.  She was admitted to the hospital last month with some shortness breath. Her troponin levels were negative.  Echocardiogram revealed mild aortic stenosis.  Left ventricle: The cavity size was normal. Wall thickness was increased in a pattern of mild LVH. Systolic function was normal. The estimated ejection fraction was in the range of 55% to 60%. Doppler parameters are consistent with abnormal left ventricular relaxation (grade 1 diastolic dysfunction). - Aortic valve: Moderately calcified annulus. There was mild stenosis. There was trivial regurgitation. Valve area (VTI): 1.32 cm^2. Valve area (Vmax): 1.46 cm^2. Valve area (Vmean): 1.34 cm^2. - Mitral valve: Calcified annulus. - Left atrium: The atrium was mildly dilated. - Atrial septum: No defect or patent foramen ovale was identified  Oct. 7, 2015:  Spell was recently hospitalized for diastolic dysfunction. She had Lasix added to her medical regimen. She was also found to be mildly hypothyroid and her Synthroid dose was increased. She's feeling better at this point. He apparently had some wheezing during her hospitalization and was prescribed a nebulizer. She has not been using the nebulizer and feels well.  Jan. 21, 2016:  [name] is seen back today for her diastolic dysfunction.  Breathing is ok.  Was in the Oct. For a right arm fracture ,  She had surgery.    Still very sore.    August 01, 2015:  [name] is seen back for follow up visit Doing well Still has some DOE with activity.   Does her own shopping ,  Keeps house ( husband helps) Walks with a cane on  occasion   Jan. 9, 2018:  Is doing very well - best in a long time .  No CP or worsening dyspnea.   July 30, 2016:  [name] is seen back for follow up visit Doing great for 89. No significant dyspnea Uses her inhaler several times a week which helps   Feb. 12, 2019:  Feels well  typical aches and pains in joints  Still gets out of breath with activities - does housework  No CP, no syncope  Is on Amiodarone 200 mg once a week - is maintaining NSR   Feb. 17,  2020:  Seen with her 58 yo husband,    [name] is seen today for follow-up of her chronic diastolic congestive heart failure.  She also has a history of tension and paroxysmal atrial fibrillation.  Doing well   Jun 07, 2019:  [name] is seen today, along.  Is very anxious  No CP,   Uses inhalers on occasion   June 15, 2020: [name] is seen today for follow up of her CHF, PAF ,HTN  Is seen fro her yearly check up . No CP , has chronic DOE . Rales in right lower lung field, concerning for chronic aspiration     Past Medical History:  Diagnosis Date  . Anxiety   . Aortic stenosis    a. mild by echo 2017.  . Arthritis   . Bradycardia    a. ? details not clear - in the past has had PVCs which have lowered effective HR count by pulse check.  . Chronic diastolic CHF (congestive heart failure) ([place])   . Diastolic dysfunction    mild   . Frequent PVCs    a. 48 Hour Holter 10/08/15 showed NSR and sinus tach with frequent PVCs, occasional bigeminy, total PVC burden 16.5%, lowest HR 59.  [name] Hyperlipidemia   . Hypertension   . Hypertensive heart disease   . Hypothyroidism   . Kidney stones   . Mild mitral regurgitation by prior echocardiogram   . Tachycardia    history of tachycardia and bradycardia    Past Surgical History:  Procedure Laterality Date  . APPENDECTOMY    . CHOLECYSTECTOMY    . EXTRACORPOREAL SHOCK WAVE LITHOTRIPSY    . EYE SURGERY Bilateral    cataract surgery with lens implant  . ORIF HUMERUS FRACTURE Right 10/31/2013   Procedure: Open Reduction Internal Fixation Right Proximal Humerus Fracture;  Surgeon: Marybelle Killings, MD;  Location: [facility];  Service: Orthopedics;  Laterality: Right;  . TONSILLECTOMY    . TOTAL ABDOMINAL HYSTERECTOMY       Current Outpatient Medications  Medication Sig Dispense Refill  . acetaminophen (TYLENOL) 500 MG tablet Take 500-1,000 mg by mouth every 6 (six) hours as needed (for pain or headaches).    [name] albuterol (VENTOLIN HFA) 108 (90  Base) MCG/ACT inhaler Inhale 2 puffs into the lungs every 6 (six) hours as needed for wheezing or shortness of breath.    . ALPRAZolam (XANAX) 0.25 MG tablet Take 0.125 mg by mouth daily as needed for anxiety.     [name] amLODipine (NORVASC) 5 MG tablet Take 1 tablet (5 mg total) by mouth daily. 30 tablet 0  . cholecalciferol (VITAMIN D) 1000 units tablet Take 2,000 Units by mouth daily.     . furosemide (LASIX) 20 MG tablet Take 10 mg by mouth daily.     [name] levothyroxine (SYNTHROID) 75 MCG tablet Take 75 mcg by mouth every morning.    [name]  Multiple Vitamins-Minerals (PRESERVISION AREDS 2+MULTI VIT) CAPS Take 1 capsule by mouth 2 (two) times daily.    . pravastatin (PRAVACHOL) 20 MG tablet Take 20 mg by mouth at bedtime.    . sertraline (ZOLOFT) 25 MG tablet Take 25 mg by mouth daily.      No current facility-administered medications for this visit.    Allergies:   Patient has no known allergies.    Social History:  The patient  reports that she has never smoked. She has never used smokeless tobacco. She reports that she does not drink alcohol and does not use drugs.   Family History:  The patient's family history includes Cancer in her brother; Emphysema in her father; Heart attack in her brother; Heart failure in her mother; Stroke in her brother.    ROS:  Please see the history of present illness.   Otherwise, review of systems are positive for none.   All other systems are reviewed and negative.    Physical Exam: Blood pressure 132/74, pulse 86, height 5' 5.5" (1.664 m), weight 142 lb 3.2 oz (64.5 kg), SpO2 96 %.  GEN:  Well nourished, well developed in no acute distress HEENT: Normal NECK: No JVD; No carotid bruits LYMPHATICS: No lymphadenopathy CARDIAC: RRR ,  2-5/0 systolic murmur   RESPIRATORY:  Coarse rales in right lower lung field.  ABDOMEN: Soft, non-tender, non-distended MUSCULOSKELETAL:  No edema; No deformity  SKIN: Warm and dry NEUROLOGIC:  Alert and oriented x 3   EKG:          Recent Labs: No results found for requested labs within last 8760 hours.    Lipid Panel    Component Value Date/Time   CHOL 184 02/24/2017 1004   TRIG 179 (H) 02/24/2017 1004   HDL 53 02/24/2017 1004   CHOLHDL 3.5 02/24/2017 1004   CHOLHDL 3.3 10/29/2011 0829   VLDL 24 10/29/2011 0829   LDLCALC 95 02/24/2017 1004      Wt Readings from Last 3 Encounters:  06/15/20 142 lb 3.2 oz (64.5 kg)  07/26/19 142 lb (64.4 kg)  06/07/19 142 lb 3.2 oz (64.5 kg)      Other studies Reviewed: Additional studies/ records that were reviewed today include: records and labs from medical . Review of the above records demonstrates: normal creatinine and potassium    ASSESSMENT AND PLAN:  1.  Dyspnea:   Has chronic right lower lung field rales.   C/w aspiration .  She occasionally gets strangled but does not choke while eating very often. Will leave up to Dr. Harrington Challenger for further eval ( possible swallow eval)          2. Hyperlipidemia -     Per primary MD   3. Hypothyroidism-        4. Chronic diastolic dysfunction.  - she still eats some additional salty foods.   Have advised her to avoid salty foods .   5. HTN:  BP is well controlled.     6.   Systolic murmur :   Has mild AS    I'll see her again in 1 year     Current medicines are reviewed at length with the patient today.  The patient does not have concerns regarding medicines.  The following changes have been made:  no change  Labs/ tests ordered today include:   Orders Placed This Encounter  Procedures  . EKG 12-Lead     Signed, Diane Moores, MD  06/15/2020 6:15 PM  [facility] [address], [place], [place]  [address] Phone: [phone]; Fax: [phone]

## 2020-06-15 ENCOUNTER — Encounter: Payer: Self-pay | Admitting: Cardiovascular Disease

## 2020-06-15 ENCOUNTER — Other Ambulatory Visit: Payer: Self-pay

## 2020-06-15 ENCOUNTER — Ambulatory Visit (INDEPENDENT_AMBULATORY_CARE_PROVIDER_SITE_OTHER): Payer: Medicare Other | Admitting: Cardiovascular Disease

## 2020-06-15 VITALS — BP 132/74 | HR 86 | Ht 65.5 in | Wt 142.2 lb

## 2020-06-15 DIAGNOSIS — I10 Essential (primary) hypertension: Secondary | ICD-10-CM

## 2020-06-15 DIAGNOSIS — I5032 Chronic diastolic (congestive) heart failure: Secondary | ICD-10-CM

## 2020-06-15 NOTE — Patient Instructions (Signed)

## 2020-07-02 DIAGNOSIS — M810 Age-related osteoporosis without current pathological fracture: Secondary | ICD-10-CM | POA: Diagnosis not present

## 2020-07-02 DIAGNOSIS — F339 Major depressive disorder, recurrent, unspecified: Secondary | ICD-10-CM | POA: Diagnosis not present

## 2020-07-02 DIAGNOSIS — E039 Hypothyroidism, unspecified: Secondary | ICD-10-CM | POA: Diagnosis not present

## 2020-07-02 DIAGNOSIS — I10 Essential (primary) hypertension: Secondary | ICD-10-CM | POA: Diagnosis not present

## 2020-07-02 DIAGNOSIS — E7800 Pure hypercholesterolemia, unspecified: Secondary | ICD-10-CM | POA: Diagnosis not present

## 2020-07-02 DIAGNOSIS — I509 Heart failure, unspecified: Secondary | ICD-10-CM | POA: Diagnosis not present

## 2020-07-02 DIAGNOSIS — I5032 Chronic diastolic (congestive) heart failure: Secondary | ICD-10-CM | POA: Diagnosis not present

## 2020-07-03 DIAGNOSIS — R0602 Shortness of breath: Secondary | ICD-10-CM | POA: Diagnosis not present

## 2020-07-03 DIAGNOSIS — R42 Dizziness and giddiness: Secondary | ICD-10-CM | POA: Diagnosis not present

## 2020-07-04 ENCOUNTER — Other Ambulatory Visit: Payer: Self-pay | Admitting: Family Medicine

## 2020-07-04 ENCOUNTER — Ambulatory Visit
Admission: RE | Admit: 2020-07-04 | Discharge: 2020-07-04 | Disposition: A | Discharge status: Home / Self Care | Payer: Medicare Other | Source: Ambulatory Visit | Attending: Family Medicine | Admitting: Family Medicine

## 2020-07-04 DIAGNOSIS — R0602 Shortness of breath: Secondary | ICD-10-CM | POA: Diagnosis not present

## 2020-07-30 DIAGNOSIS — I509 Heart failure, unspecified: Secondary | ICD-10-CM | POA: Diagnosis not present

## 2020-07-30 DIAGNOSIS — I10 Essential (primary) hypertension: Secondary | ICD-10-CM | POA: Diagnosis not present

## 2020-07-30 DIAGNOSIS — M549 Dorsalgia, unspecified: Secondary | ICD-10-CM | POA: Diagnosis not present

## 2020-07-30 DIAGNOSIS — E039 Hypothyroidism, unspecified: Secondary | ICD-10-CM | POA: Diagnosis not present

## 2020-07-30 DIAGNOSIS — R2681 Unsteadiness on feet: Secondary | ICD-10-CM | POA: Diagnosis not present

## 2020-07-30 DIAGNOSIS — F339 Major depressive disorder, recurrent, unspecified: Secondary | ICD-10-CM | POA: Diagnosis not present

## 2020-09-06 DIAGNOSIS — I10 Essential (primary) hypertension: Secondary | ICD-10-CM | POA: Diagnosis not present

## 2020-09-27 DIAGNOSIS — I5032 Chronic diastolic (congestive) heart failure: Secondary | ICD-10-CM | POA: Diagnosis not present

## 2020-09-27 DIAGNOSIS — F339 Major depressive disorder, recurrent, unspecified: Secondary | ICD-10-CM | POA: Diagnosis not present

## 2020-09-27 DIAGNOSIS — I509 Heart failure, unspecified: Secondary | ICD-10-CM | POA: Diagnosis not present

## 2020-09-27 DIAGNOSIS — E7800 Pure hypercholesterolemia, unspecified: Secondary | ICD-10-CM | POA: Diagnosis not present

## 2020-09-27 DIAGNOSIS — I10 Essential (primary) hypertension: Secondary | ICD-10-CM | POA: Diagnosis not present

## 2020-09-27 DIAGNOSIS — M810 Age-related osteoporosis without current pathological fracture: Secondary | ICD-10-CM | POA: Diagnosis not present

## 2020-09-27 DIAGNOSIS — E039 Hypothyroidism, unspecified: Secondary | ICD-10-CM | POA: Diagnosis not present

## 2020-10-02 DIAGNOSIS — Z23 Encounter for immunization: Secondary | ICD-10-CM | POA: Diagnosis not present

## 2020-10-27 DIAGNOSIS — Z23 Encounter for immunization: Secondary | ICD-10-CM | POA: Diagnosis not present

## 2020-11-15 ENCOUNTER — Emergency Department (HOSPITAL_COMMUNITY): Payer: Medicare Other

## 2020-11-15 ENCOUNTER — Encounter (HOSPITAL_COMMUNITY): Payer: Self-pay | Admitting: Radiology

## 2020-11-15 ENCOUNTER — Emergency Department (HOSPITAL_COMMUNITY)
Admission: EM | Admit: 2020-11-15 | Discharge: 2020-11-15 | Disposition: A | Discharge status: Home / Self Care | Payer: Medicare Other | Attending: Emergency Medicine | Admitting: Emergency Medicine

## 2020-11-15 DIAGNOSIS — S8001XA Contusion of right knee, initial encounter: Secondary | ICD-10-CM | POA: Insufficient documentation

## 2020-11-15 DIAGNOSIS — Z79899 Other long term (current) drug therapy: Secondary | ICD-10-CM | POA: Insufficient documentation

## 2020-11-15 DIAGNOSIS — I110 Hypertensive heart disease with heart failure: Secondary | ICD-10-CM | POA: Diagnosis not present

## 2020-11-15 DIAGNOSIS — I10 Essential (primary) hypertension: Secondary | ICD-10-CM | POA: Diagnosis not present

## 2020-11-15 DIAGNOSIS — Y9301 Activity, walking, marching and hiking: Secondary | ICD-10-CM | POA: Insufficient documentation

## 2020-11-15 DIAGNOSIS — I5032 Chronic diastolic (congestive) heart failure: Secondary | ICD-10-CM | POA: Insufficient documentation

## 2020-11-15 DIAGNOSIS — S8991XA Unspecified injury of right lower leg, initial encounter: Secondary | ICD-10-CM | POA: Diagnosis present

## 2020-11-15 DIAGNOSIS — W1839XA Other fall on same level, initial encounter: Secondary | ICD-10-CM | POA: Diagnosis not present

## 2020-11-15 DIAGNOSIS — M7989 Other specified soft tissue disorders: Secondary | ICD-10-CM | POA: Diagnosis not present

## 2020-11-15 DIAGNOSIS — E039 Hypothyroidism, unspecified: Secondary | ICD-10-CM | POA: Insufficient documentation

## 2020-11-15 DIAGNOSIS — W19XXXA Unspecified fall, initial encounter: Secondary | ICD-10-CM | POA: Diagnosis not present

## 2020-11-15 MED ORDER — ACETAMINOPHEN 325 MG PO TABS
650.0000 mg | ORAL_TABLET | Freq: Once | ORAL | Status: AC
  Administered 2020-11-15: 650 mg via ORAL
  Filled 2020-11-15: qty 2

## 2020-11-15 NOTE — ED Notes (Signed)
Ace wrap applied to right knee

## 2020-11-15 NOTE — ED Provider Notes (Signed)
[facility] EMERGENCY DEPARTMENT Provider Note  CSN: [id number] Arrival date & time: 11/15/20 1501    History Chief Complaint  Patient presents with   [name] is a 87 y.o. female was getting out of the car at [facility] after eating lunch with her husband and granddaughter when she stumbled and fell, she states she had her cane but got twisted up. She fell onto her R side but denies any head injury or LOC. Complaining of R knee and lower leg pain, worse with movement. Does not take blood thinners.    Past Medical History:  Diagnosis Date   Anxiety    Aortic stenosis    a. mild by echo 2017.   Arthritis    Bradycardia    a. ? details not clear - in the past has had PVCs which have lowered effective HR count by pulse check.   Chronic diastolic CHF (congestive heart failure) ([facility])    Diastolic dysfunction    mild    Frequent PVCs    a. 48 Hour Holter 10/08/15 showed NSR and sinus tach with frequent PVCs, occasional bigeminy, total PVC burden 16.5%, lowest HR 59.   Hyperlipidemia    Hypertension    Hypertensive heart disease    Hypothyroidism    Kidney stones    Mild mitral regurgitation by prior echocardiogram    Tachycardia    history of tachycardia and bradycardia    Past Surgical History:  Procedure Laterality Date   APPENDECTOMY     CHOLECYSTECTOMY     EXTRACORPOREAL SHOCK WAVE LITHOTRIPSY     EYE SURGERY Bilateral    cataract surgery with lens implant   ORIF HUMERUS FRACTURE Right 10/31/2013   Procedure: Open Reduction Internal Fixation Right Proximal Humerus Fracture;  Surgeon: Marybelle Killings, MD;  Location: [facility];  Service: Orthopedics;  Laterality: Right;   TONSILLECTOMY     TOTAL ABDOMINAL HYSTERECTOMY      Family History  Problem Relation Age of Onset   Emphysema Father    Heart failure Mother    Heart attack Brother    Cancer Brother        kidney   Stroke Brother     Social History   Tobacco Use   Smoking status: Never    Smokeless tobacco: Never  Vaping Use   Vaping Use: Never used  Substance Use Topics   Alcohol use: No   Drug use: No     Home Medications Prior to Admission medications   Medication Sig Start Date End Date Taking? Authorizing Provider  acetaminophen (TYLENOL) 500 MG tablet Take 500-1,000 mg by mouth every 6 (six) hours as needed (for pain or headaches).    [provider]  albuterol (VENTOLIN HFA) 108 (90 Base) MCG/ACT inhaler Inhale 2 puffs into the lungs every 6 (six) hours as needed for wheezing or shortness of breath.    [provider]  ALPRAZolam Duanne Moron) 0.25 MG tablet Take 0.125 mg by mouth daily as needed for anxiety.     [provider]  amLODipine (NORVASC) 5 MG tablet Take 1 tablet (5 mg total) by mouth daily. 03/30/15   [name], Wonda Cheng, MD  cholecalciferol (VITAMIN D) 1000 units tablet Take 2,000 Units by mouth daily.     [provider]  furosemide (LASIX) 20 MG tablet Take 10 mg by mouth daily.     [provider]  levothyroxine (SYNTHROID) 75 MCG tablet Take 75 mcg by mouth every  morning. 05/27/19   [provider]  Multiple Vitamins-Minerals (PRESERVISION AREDS 2+MULTI VIT) CAPS Take 1 capsule by mouth 2 (two) times daily.    [provider]  pravastatin (PRAVACHOL) 20 MG tablet Take 20 mg by mouth at bedtime.    [provider]  sertraline (ZOLOFT) 25 MG tablet Take 25 mg by mouth daily.     [provider]  Calcium Carb-Cholecalciferol (CALCIUM 1000 + D PO) Take 1,000 mg by mouth daily.    04/01/11  [provider]  escitalopram (LEXAPRO) 10 MG tablet Take 10 mg by mouth daily.    04/01/11  [provider]     Allergies    Patient has no known allergies.   Review of Systems   Review of Systems A comprehensive review of systems was completed and negative except as noted in HPI.    Physical Exam BP (!) 182/73 (BP Location: Right Arm)   Pulse 83   Temp 98.1 F (36.7  C) (Oral)   Resp 16   Ht 5\' 5"  (1.651 m)   Wt 63.5 kg   LMP  (LMP Unknown)   SpO2 95%   BMI 23.30 kg/m   Physical Exam Vitals and nursing note reviewed.  Constitutional:      Appearance: Normal appearance.  HENT:     Head: Normocephalic and atraumatic.     Nose: Nose normal.     Mouth/Throat:     Mouth: Mucous membranes are moist.  Eyes:     Extraocular Movements: Extraocular movements intact.     Conjunctiva/sclera: Conjunctivae normal.  Cardiovascular:     Rate and Rhythm: Normal rate.  Pulmonary:     Effort: Pulmonary effort is normal.     Breath sounds: Normal breath sounds.  Abdominal:     General: Abdomen is flat.     Palpations: Abdomen is soft.     Tenderness: There is no abdominal tenderness.  Musculoskeletal:        General: Swelling and tenderness present.     Cervical back: Neck supple. No tenderness.     Comments: Tenderness and swelling to R knee, decreased ROM due to pain, no deformity  Skin:    General: Skin is warm and dry.  Neurological:     General: No focal deficit present.     Mental Status: She is alert.  Psychiatric:        Mood and Affect: Mood normal.     ED Results / Procedures / Treatments   Labs (all labs ordered are listed, but only abnormal results are displayed) Labs Reviewed - No data to display  EKG None  Radiology DG Tibia/Fibula Right  Result Date: 11/15/2020 CLINICAL DATA:  Leg pain post fall EXAM: RIGHT TIBIA AND FIBULA - 2 VIEW COMPARISON:  None FINDINGS: Osseous demineralization. Degenerative changes at the RIGHT knee. Soft tissue swelling at lateral LEFT ankle. No acute fracture, dislocation, or bone destruction. IMPRESSION: No acute osseous abnormalities. Electronically Signed   By: Lavonia Dana M.D.   On: 11/15/2020 16:41   DG Knee Complete 4 Views Right  Result Date: 11/15/2020 CLINICAL DATA:  Fall EXAM: RIGHT KNEE - COMPLETE 4+ VIEW COMPARISON:  None FINDINGS: Marked osseous demineralization. Scattered joint space  narrowing and minimal spur formation. Chondrocalcinosis question CPPD. Vertebral body heights maintained. No fracture, subluxation, or bone destruction. Calcific debris within small joint effusion and suprapatellar recess. Anterior infrapatellar soft tissue swelling. IMPRESSION: Osseous demineralization with degenerative changes and question CPPD LEFT knee. Calcific debris and  minimal joint effusion. No acute fracture or dislocation. Electronically Signed   By: Lavonia Dana M.D.   On: 11/15/2020 16:38    Procedures Procedures  Medications Ordered in the ED Medications  acetaminophen (TYLENOL) tablet 650 mg (650 mg Oral Given 11/15/20 1636)     MDM Rules/Calculators/A&P MDM Patient with mechanical fall, only complaining of R knee/leg pain. No head injury or signs of trauma. Will send for xrays. APAP for pain.   ED Course  I have reviewed the triage vital signs and the nursing notes.  Pertinent labs & imaging results that were available during my care of the patient were reviewed by me and considered in my medical decision making (see chart for details).  Clinical Course as of 11/15/20 1752  Thu Nov 15, 2020  1748 Patient's xrays neg for fracture. She has been able to walk. Plan ACE wrap for comfort. PCP follow up.  [CS]    Clinical Course User Index [CS] Truddie Hidden, MD    Final Clinical Impression(s) / ED Diagnoses Final diagnoses:  Contusion of right knee, initial encounter    Rx / DC Orders ED Discharge Orders     None        Truddie Hidden, MD 11/15/20 1752

## 2020-11-15 NOTE — ED Notes (Signed)
Pt removed C-Collar after repeat prompting to keep it on.

## 2020-11-15 NOTE — ED Triage Notes (Signed)
PT was up walking without her assistive device and states she lost her balance ad fell. No LOC. No blood thinner and pt didn't hit her head. Pt fell to her right side and states she has pain in her right leg and knee only.

## 2020-11-16 DIAGNOSIS — M810 Age-related osteoporosis without current pathological fracture: Secondary | ICD-10-CM | POA: Diagnosis not present

## 2020-11-16 DIAGNOSIS — I10 Essential (primary) hypertension: Secondary | ICD-10-CM | POA: Diagnosis not present

## 2020-11-16 DIAGNOSIS — I509 Heart failure, unspecified: Secondary | ICD-10-CM | POA: Diagnosis not present

## 2020-11-16 DIAGNOSIS — E7800 Pure hypercholesterolemia, unspecified: Secondary | ICD-10-CM | POA: Diagnosis not present

## 2020-11-16 DIAGNOSIS — I5032 Chronic diastolic (congestive) heart failure: Secondary | ICD-10-CM | POA: Diagnosis not present

## 2020-11-16 DIAGNOSIS — E039 Hypothyroidism, unspecified: Secondary | ICD-10-CM | POA: Diagnosis not present

## 2020-11-16 DIAGNOSIS — F339 Major depressive disorder, recurrent, unspecified: Secondary | ICD-10-CM | POA: Diagnosis not present

## 2020-11-20 DIAGNOSIS — M25561 Pain in right knee: Secondary | ICD-10-CM | POA: Diagnosis not present

## 2020-11-21 DIAGNOSIS — M25551 Pain in right hip: Secondary | ICD-10-CM | POA: Diagnosis not present

## 2020-11-21 DIAGNOSIS — M25561 Pain in right knee: Secondary | ICD-10-CM | POA: Diagnosis not present

## 2020-11-26 DIAGNOSIS — M1711 Unilateral primary osteoarthritis, right knee: Secondary | ICD-10-CM | POA: Diagnosis not present

## 2020-11-26 DIAGNOSIS — S82161A Torus fracture of upper end of right tibia, initial encounter for closed fracture: Secondary | ICD-10-CM | POA: Diagnosis not present

## 2020-12-27 DIAGNOSIS — I509 Heart failure, unspecified: Secondary | ICD-10-CM | POA: Diagnosis not present

## 2020-12-27 DIAGNOSIS — E7800 Pure hypercholesterolemia, unspecified: Secondary | ICD-10-CM | POA: Diagnosis not present

## 2020-12-27 DIAGNOSIS — I5032 Chronic diastolic (congestive) heart failure: Secondary | ICD-10-CM | POA: Diagnosis not present

## 2020-12-27 DIAGNOSIS — F339 Major depressive disorder, recurrent, unspecified: Secondary | ICD-10-CM | POA: Diagnosis not present

## 2020-12-27 DIAGNOSIS — M810 Age-related osteoporosis without current pathological fracture: Secondary | ICD-10-CM | POA: Diagnosis not present

## 2020-12-27 DIAGNOSIS — E039 Hypothyroidism, unspecified: Secondary | ICD-10-CM | POA: Diagnosis not present

## 2020-12-27 DIAGNOSIS — I10 Essential (primary) hypertension: Secondary | ICD-10-CM | POA: Diagnosis not present

## 2020-12-31 DIAGNOSIS — F339 Major depressive disorder, recurrent, unspecified: Secondary | ICD-10-CM | POA: Diagnosis not present

## 2020-12-31 DIAGNOSIS — M810 Age-related osteoporosis without current pathological fracture: Secondary | ICD-10-CM | POA: Diagnosis not present

## 2020-12-31 DIAGNOSIS — F419 Anxiety disorder, unspecified: Secondary | ICD-10-CM | POA: Diagnosis not present

## 2020-12-31 DIAGNOSIS — I10 Essential (primary) hypertension: Secondary | ICD-10-CM | POA: Diagnosis not present

## 2020-12-31 DIAGNOSIS — E7800 Pure hypercholesterolemia, unspecified: Secondary | ICD-10-CM | POA: Diagnosis not present

## 2020-12-31 DIAGNOSIS — M25561 Pain in right knee: Secondary | ICD-10-CM | POA: Diagnosis not present

## 2020-12-31 DIAGNOSIS — E039 Hypothyroidism, unspecified: Secondary | ICD-10-CM | POA: Diagnosis not present

## 2021-01-02 DIAGNOSIS — M25561 Pain in right knee: Secondary | ICD-10-CM | POA: Diagnosis not present

## 2021-01-02 DIAGNOSIS — S82161A Torus fracture of upper end of right tibia, initial encounter for closed fracture: Secondary | ICD-10-CM | POA: Diagnosis not present

## 2021-01-02 DIAGNOSIS — S8001XD Contusion of right knee, subsequent encounter: Secondary | ICD-10-CM | POA: Diagnosis not present

## 2021-01-31 ENCOUNTER — Other Ambulatory Visit: Payer: Self-pay

## 2021-01-31 ENCOUNTER — Encounter (HOSPITAL_COMMUNITY): Payer: Self-pay | Admitting: Emergency Medicine

## 2021-01-31 ENCOUNTER — Emergency Department (HOSPITAL_COMMUNITY)
Admission: EM | Admit: 2021-01-31 | Discharge: 2021-01-31 | Disposition: A | Discharge status: Home / Self Care | Payer: Medicare Other | Attending: Emergency Medicine | Admitting: Emergency Medicine

## 2021-01-31 ENCOUNTER — Emergency Department (HOSPITAL_COMMUNITY): Payer: Medicare Other

## 2021-01-31 DIAGNOSIS — R0602 Shortness of breath: Secondary | ICD-10-CM | POA: Diagnosis not present

## 2021-01-31 DIAGNOSIS — R7989 Other specified abnormal findings of blood chemistry: Secondary | ICD-10-CM

## 2021-01-31 DIAGNOSIS — R9431 Abnormal electrocardiogram [ECG] [EKG]: Secondary | ICD-10-CM | POA: Diagnosis not present

## 2021-01-31 DIAGNOSIS — R0600 Dyspnea, unspecified: Secondary | ICD-10-CM | POA: Insufficient documentation

## 2021-01-31 DIAGNOSIS — R0789 Other chest pain: Secondary | ICD-10-CM | POA: Diagnosis not present

## 2021-01-31 DIAGNOSIS — Z20822 Contact with and (suspected) exposure to covid-19: Secondary | ICD-10-CM | POA: Diagnosis not present

## 2021-01-31 DIAGNOSIS — J439 Emphysema, unspecified: Secondary | ICD-10-CM | POA: Diagnosis not present

## 2021-01-31 DIAGNOSIS — R011 Cardiac murmur, unspecified: Secondary | ICD-10-CM | POA: Diagnosis not present

## 2021-01-31 DIAGNOSIS — I509 Heart failure, unspecified: Secondary | ICD-10-CM | POA: Diagnosis not present

## 2021-01-31 DIAGNOSIS — R791 Abnormal coagulation profile: Secondary | ICD-10-CM | POA: Insufficient documentation

## 2021-01-31 LAB — CBC
HCT: 38 % (ref 36.0–46.0)
Hemoglobin: 12.3 g/dL (ref 12.0–15.0)
MCH: 31.9 pg (ref 26.0–34.0)
MCHC: 32.4 g/dL (ref 30.0–36.0)
MCV: 98.4 fL (ref 80.0–100.0)
Platelets: 343 10*3/uL (ref 150–400)
RBC: 3.86 MIL/uL — ABNORMAL LOW (ref 3.87–5.11)
RDW: 13.8 % (ref 11.5–15.5)
WBC: 9.3 10*3/uL (ref 4.0–10.5)
nRBC: 0 % (ref 0.0–0.2)

## 2021-01-31 LAB — BASIC METABOLIC PANEL
Anion gap: 7 (ref 5–15)
BUN: 20 mg/dL (ref 8–23)
CO2: 27 mmol/L (ref 22–32)
Calcium: 9.4 mg/dL (ref 8.9–10.3)
Chloride: 104 mmol/L (ref 98–111)
Creatinine, Ser: 0.6 mg/dL (ref 0.44–1.00)
GFR, Estimated: >60 mL/min (ref >60)
Glucose, Bld: 105 mg/dL — ABNORMAL HIGH (ref 70–99)
Potassium: 5.2 mmol/L — ABNORMAL HIGH (ref 3.5–5.1)
Sodium: 138 mmol/L (ref 135–145)

## 2021-01-31 LAB — RESP PANEL BY RT-PCR (FLU A&B, COVID) ARPGX2
Influenza A by PCR: NEGATIVE
Influenza B by PCR: NEGATIVE
SARS Coronavirus 2 by RT PCR: NEGATIVE

## 2021-01-31 LAB — BRAIN NATRIURETIC PEPTIDE: B Natriuretic Peptide: 67.8 pg/mL (ref 0.0–100.0)

## 2021-01-31 LAB — TROPONIN I (HIGH SENSITIVITY): Troponin I (High Sensitivity): 6 ng/L (ref <18)

## 2021-01-31 MED ORDER — IOHEXOL 350 MG/ML SOLN
80.0000 mL | Freq: Once | INTRAVENOUS | Status: AC | PRN
  Administered 2021-01-31: 80 mL via INTRAVENOUS

## 2021-01-31 NOTE — ED Triage Notes (Signed)
Sent over from [facility], had a positive D-dimer and trop today, wanted her to come to the ED to rule out pulmonary embolism. Complains of SOB worsening over the past month.

## 2021-01-31 NOTE — Discharge Instructions (Signed)
Please follow-up with your primary care doctor to discuss her symptoms and potentially further testing.  If you feel your breathing is worsening, you develop chest pain or other new concerns symptom, please return to ER for reassessment.  Your potassium level was borderline elevated.  Please have your primary care doctor repeat your potassium levels sometime in the next few days to monitor this going forward.

## 2021-01-31 NOTE — ED Provider Notes (Signed)
[facility] DEPT Provider Note   CSN: [id number] Arrival date & time: 01/31/21  1736     History  Chief Complaint  Patient presents with   Abnormal Lab    [name] is a 88 y.o. female.  Presenting to the emergency room with concern for abnormal lab.  Patient reports that she had a routine visit with her primary care doctor office today and they obtained blood work.  Patient states that she was told that her D-dimer was elevated and she needed to go to the hospital to rule out pulmonary embolism.  Patient upon further history taking states that she has felt somewhat more short of breath over the last few months.  This has not acutely changed today.  She does not feel short of breath at present and does not have any chest pain.  Notices the breathing most whenever she is exerting herself.  No associated leg pain.  No leg swelling.  She denies prior history of DVT/PE.  Patient quite hard of hearing, additional history obtained from son at bedside who provided some additional history and corroborated the information above.  HPI     Home Medications Prior to Admission medications   Medication Sig Start Date End Date Taking? Authorizing Provider  acetaminophen (TYLENOL) 500 MG tablet Take 500-1,000 mg by mouth every 6 (six) hours as needed (for pain or headaches).    [provider]  albuterol (VENTOLIN HFA) 108 (90 Base) MCG/ACT inhaler Inhale 2 puffs into the lungs every 6 (six) hours as needed for wheezing or shortness of breath.    [provider]  ALPRAZolam Duanne Moron) 0.25 MG tablet Take 0.125 mg by mouth daily as needed for anxiety.     [provider]  amLODipine (NORVASC) 5 MG tablet Take 1 tablet (5 mg total) by mouth daily. 03/30/15   [name], Wonda Cheng, MD  cholecalciferol (VITAMIN D) 1000 units tablet Take 2,000 Units by mouth daily.     [provider]  furosemide (LASIX) 20 MG tablet Take 10 mg by mouth daily.      [provider]  levothyroxine (SYNTHROID) 75 MCG tablet Take 75 mcg by mouth every morning. 05/27/19   [provider]  Multiple Vitamins-Minerals (PRESERVISION AREDS 2+MULTI VIT) CAPS Take 1 capsule by mouth 2 (two) times daily.    [provider]  pravastatin (PRAVACHOL) 20 MG tablet Take 20 mg by mouth at bedtime.    [provider]  sertraline (ZOLOFT) 25 MG tablet Take 25 mg by mouth daily.     [provider]  Calcium Carb-Cholecalciferol (CALCIUM 1000 + D PO) Take 1,000 mg by mouth daily.    04/01/11  [provider]  escitalopram (LEXAPRO) 10 MG tablet Take 10 mg by mouth daily.    04/01/11  [provider]      Allergies    Patient has no known allergies.    Review of Systems   Review of Systems  Constitutional:  Positive for fatigue. Negative for chills and fever.  HENT:  Negative for ear pain and sore throat.   Eyes:  Negative for pain and visual disturbance.  Respiratory:  Positive for shortness of breath. Negative for cough.   Cardiovascular:  Negative for chest pain and palpitations.  Gastrointestinal:  Negative for abdominal pain and vomiting.  Genitourinary:  Negative for dysuria and hematuria.  Musculoskeletal:  Negative for arthralgias and back pain.  Skin:  Negative for color change and rash.  Neurological:  Negative for seizures and syncope.  All other systems reviewed and are negative.  Physical Exam Updated Vital Signs BP (!) 170/73    Pulse 77    Temp 98.4 F (36.9 C)    Resp 16    Ht 5\' 5"  (1.651 m)    Wt 64 kg    LMP  (LMP Unknown)    SpO2 94%    BMI 23.48 kg/m  Physical Exam Vitals and nursing note reviewed.  Constitutional:      General: She is not in acute distress.    Appearance: She is well-developed.  HENT:     Head: Normocephalic and atraumatic.  Eyes:     Conjunctiva/sclera: Conjunctivae normal.  Cardiovascular:     Rate and Rhythm: Normal rate and regular rhythm.     Heart sounds:  No murmur heard. Pulmonary:     Effort: Pulmonary effort is normal. No respiratory distress.     Breath sounds: Normal breath sounds.  Abdominal:     Palpations: Abdomen is soft.     Tenderness: There is no abdominal tenderness.  Musculoskeletal:        General: No swelling.     Cervical back: Neck supple.  Skin:    General: Skin is warm and dry.     Capillary Refill: Capillary refill takes less than 2 seconds.  Neurological:     General: No focal deficit present.     Mental Status: She is alert.  Psychiatric:        Mood and Affect: Mood normal.    ED Results / Procedures / Treatments   Labs (all labs ordered are listed, but only abnormal results are displayed) Labs Reviewed  BASIC METABOLIC PANEL - Abnormal; Notable for the following components:      Result Value   Potassium 5.2 (*)    Glucose, Bld 105 (*)    All other components within normal limits  CBC - Abnormal; Notable for the following components:   RBC 3.86 (*)    All other components within normal limits  RESP PANEL BY RT-PCR (FLU A&B, COVID) [id number]  BRAIN NATRIURETIC PEPTIDE  TROPONIN I (HIGH SENSITIVITY)  TROPONIN I (HIGH SENSITIVITY)    EKG EKG Interpretation  Date/Time:  Thursday January 31 2021 18:41:54 EST Ventricular Rate:  74 PR Interval:  187 QRS Duration: 90 QT Interval:  383 QTC Calculation: 425 R Axis:   66 Text Interpretation: Sinus rhythm Left atrial enlargement Confirmed by Madalyn Rob [phone]) on 01/31/2021 7:12:24 PM  Radiology DG Chest 2 View  Result Date: 01/31/2021 CLINICAL DATA:  Positive D-dimer and elevated troponin, short of breath worsening for 1 month EXAM: CHEST - 2 VIEW COMPARISON:  07/04/2020 FINDINGS: Frontal and lateral views of the chest demonstrate an unremarkable cardiac silhouette. Chronic scarring and fibrosis again noted, without acute airspace disease, effusion, or pneumothorax. Stable biapical pleural thickening. No acute bony abnormalities. Postsurgical changes  right humerus. IMPRESSION: 1. Chronic scarring and fibrosis.  No acute airspace disease. Electronically Signed   By: Randa Ngo M.D.   On: 01/31/2021 18:23   CT Angio Chest PE W/Cm &/Or Wo Cm  Result Date: 01/31/2021 CLINICAL DATA:  Worsening shortness of breath EXAM: CT ANGIOGRAPHY CHEST WITH CONTRAST TECHNIQUE: Multidetector CT imaging of the chest was performed using the standard protocol during bolus administration of intravenous contrast. Multiplanar CT image reconstructions and MIPs were obtained to evaluate the vascular anatomy. RADIATION DOSE REDUCTION: This exam was performed according to the departmental dose-optimization program which includes automated  exposure control, adjustment of the mA and/or kV according to patient size and/or use of iterative reconstruction technique. CONTRAST:  75mL OMNIPAQUE IOHEXOL 350 MG/ML SOLN COMPARISON:  Plain film from earlier in the same day, CT from 10/01/2009. FINDINGS: Cardiovascular: Atherosclerotic calcifications of the thoracic aorta are noted. No aneurysmal dilatation or dissection is identified. No cardiac enlargement is seen. Calcifications of the mitral and aortic valves are noted. The pulmonary artery shows a normal branching pattern bilaterally. No filling defect to suggest pulmonary embolism is identified. Mediastinum/Nodes: Thoracic inlet is within normal limits. No sizable hilar or mediastinal adenopathy is noted. The esophagus as visualized is within normal limits. Lungs/Pleura: Lungs are well aerated bilaterally. No focal confluent infiltrate or sizable effusion is seen. Scattered areas of subpleural fibrotic change are noted progressed in the interval from the prior CT from 2011 but similar to that seen on recent plain film examination. No sizable effusion or sizable parenchymal nodule is noted. Upper Abdomen: Visualized upper abdomen shows changes of prior cholecystectomy. No other focal abnormality is noted. Musculoskeletal: Degenerative  changes of the thoracic spine are seen. No acute rib abnormality is noted. Review of the MIP images confirms the above findings. IMPRESSION: No evidence of pulmonary emboli. Scattered subpleural fibrotic changes without acute abnormality. Changes of prior cholecystectomy. Aortic Atherosclerosis (ICD10-I70.0) and Emphysema (ICD10-J43.9). Electronically Signed   By: Inez Catalina M.D.   On: 01/31/2021 20:26    Procedures Procedures    Medications Ordered in ED Medications  iohexol (OMNIPAQUE) 350 MG/ML injection 80 mL (80 mLs Intravenous Contrast Given 01/31/21 2004)    ED Course/ Medical Decision Making/ A&P                           Medical Decision Making Risk Prescription drug management.   88 year old lady presented to ER from primary care office with concern for elevated D-dimer.  Patient currently denies any ongoing symptoms but has endorsed some shortness of breath over the last couple months.  CT PE study was obtained here.  I independently reviewed CT imaging and agree with radiology report.  No evidence for pulmonary emboli.  Some fibrotic changes without acute abnormality.  Her EKG does not demonstrate any acute ischemic change and her troponin is within normal limits.  No chest pain, no ongoing symptoms at present, doubt ACS.  Remainder of basic laboratory work reviewed, noticed slight/borderline hyperkalemia.  For this finding would recommend patient have her primary care doctor repeat testing in a few days to one week but suspect unrelated to her symptoms today.  Given patient remained well-appearing with stable vital signs, and a forementioned work-up feel that she can be discharged and managed in the outpatient setting does not require inpatient admission today.    Patient quite hard of hearing, additional history obtained from son at bedside who provided some additional history and corroborated the information above.        Final Clinical Impression(s) / ED  Diagnoses Final diagnoses:  Positive D dimer  Dyspnea, unspecified type    Rx / DC Orders ED Discharge Orders     None         Lucrezia Starch, MD 01/31/21 2206

## 2021-01-31 NOTE — ED Notes (Signed)
Patient was assisted to the restroom. Patient is now with CT.

## 2021-01-31 NOTE — ED Provider Triage Note (Signed)
Emergency Medicine Provider Triage Evaluation Note  [name] , a 88 y.o. female  was evaluated in triage.  Pt complains of an abnormal lab.  She was sent over here by [facility].  Apparently she has been having worsening shortness of breath over the past month and has had associated chest pains.  Chest pain has been constant.  It is not pleuritic.  No leg swelling.  Not on blood thinners.  No history of blood clot.  She had a positive D-dimer and troponin.  She was sent here for reevaluation and for pulmonary embolism rule out.  Review of Systems  Positive: Chest pain, shortness of breath Negative:   Physical Exam  BP (!) 163/62 (BP Location: Left Arm)    Pulse 81    Temp 98.4 F (36.9 C)    Resp 18    Ht 5\' 5"  (1.651 m)    Wt 64 kg    LMP  (LMP Unknown)    SpO2 94%    BMI 23.48 kg/m  Gen:   Awake, no distress   Resp:  Normal effort  MSK:   Moves extremities without difficulty  Other:  Appears comfortable.  No dyspnea at rest.  Lung sounds clear to auscultation bilaterally.  No edema in lower extremities.  Medical Decision Making  Medically screening exam initiated at 5:51 PM.  Appropriate orders placed.  KELVIN BURPEE was informed that the remainder of the evaluation will be completed by another provider, this initial triage assessment does not replace that evaluation, and the importance of remaining in the ED until their evaluation is complete.  PE study ordered.    Adolphus Birchwood, PA-C 01/31/21 1752

## 2021-02-11 DIAGNOSIS — I209 Angina pectoris, unspecified: Secondary | ICD-10-CM | POA: Diagnosis not present

## 2021-02-11 DIAGNOSIS — M25569 Pain in unspecified knee: Secondary | ICD-10-CM | POA: Diagnosis not present

## 2021-02-11 DIAGNOSIS — H9190 Unspecified hearing loss, unspecified ear: Secondary | ICD-10-CM | POA: Diagnosis not present

## 2021-02-11 DIAGNOSIS — F419 Anxiety disorder, unspecified: Secondary | ICD-10-CM | POA: Diagnosis not present

## 2021-02-11 DIAGNOSIS — R0609 Other forms of dyspnea: Secondary | ICD-10-CM | POA: Diagnosis not present

## 2021-02-17 NOTE — Progress Notes (Signed)
Cardiology Office Note:    Date:  02/19/2021   ID:  [name], DOB [birth year], MRN [id number]  PCP:  [facility] @ [place] Providers Cardiologist:  Mertie Moores, MD      Referring MD: [facility]   Presents to the clinic today for follow-up evaluation of her angina pectoris.  History of Present Illness:    [name] is a 88 y.o. female with a hx of chronic diastolic CHF, near syncope, frequent PVCs, hypertension, hypothyroidism, UTI, dyspnea on exertion, bradycardia, and abnormal chest CT.  She presented to the [facility] emergency department with concern for abnormal D-dimer (01/31/2021).  Her CT angio chest showed no evidence of PE and prior cholecystectomy.  Her high-sensitivity troponins were low and flat.  She denied shortness of breath and chest pain.  She noted increased DOE with increased physical activity.  She denied lower extremity pain and swelling.  She denied history of DVT/PE.  She was discharged in stable condition.  She presents to the clinic today for follow-up evaluation states She notices headache and mild chest pressure and intermittent periods throughout the day.  She denies exacerbating or alleviating symptoms.  We reviewed her recent emergency department visit.  On exam she reports worsening chest discomfort with deep breathing.  On exam she does have some inspiratory wheezing.  We reviewed the importance of consistently taking her albuterol inhaler.  She also reports that her blood pressure has been elevated at home for the last few weeks in the 140/60 range.  She is only taking 2.5 mg of amlodipine.  I have encouraged her to eat a heart healthy low-sodium diet, increasing physical activity as tolerated, use her inhaler regularly, will increase her amlodipine to 5 mg daily, and have her maintain a blood pressure log.  We will plan follow-up for 3-4 months.  Today she denies lower extremity edema, fatigue,  palpitations, melena, hematuria, hemoptysis, diaphoresis, weakness, presyncope, syncope, orthopnea, and PND.   Past Medical History:  Diagnosis Date   Anxiety    Aortic stenosis    a. mild by echo 2017.   Arthritis    Bradycardia    a. ? details not clear - in the past has had PVCs which have lowered effective HR count by pulse check.   Chronic diastolic CHF (congestive heart failure) (HCC)    Diastolic dysfunction    mild    Frequent PVCs    a. 48 Hour Holter 10/08/15 showed NSR and sinus tach with frequent PVCs, occasional bigeminy, total PVC burden 16.5%, lowest HR 59.   Hyperlipidemia    Hypertension    Hypertensive heart disease    Hypothyroidism    Kidney stones    Mild mitral regurgitation by prior echocardiogram    Tachycardia    history of tachycardia and bradycardia    Past Surgical History:  Procedure Laterality Date   APPENDECTOMY     CHOLECYSTECTOMY     EXTRACORPOREAL SHOCK WAVE LITHOTRIPSY     EYE SURGERY Bilateral    cataract surgery with lens implant   ORIF HUMERUS FRACTURE Right 10/31/2013   Procedure: Open Reduction Internal Fixation Right Proximal Humerus Fracture;  Surgeon: Marybelle Killings, MD;  Location: [facility];  Service: Orthopedics;  Laterality: Right;   TONSILLECTOMY     TOTAL ABDOMINAL HYSTERECTOMY      Current Medications: Current Meds  Medication Sig   acetaminophen (TYLENOL) 500 MG tablet Take 500-1,000 mg by mouth every  6 (six) hours as needed (for pain or headaches).   albuterol (VENTOLIN HFA) 108 (90 Base) MCG/ACT inhaler Inhale 2 puffs into the lungs every 6 (six) hours as needed for wheezing or shortness of breath.   ALPRAZolam (XANAX) 0.25 MG tablet Take 0.125 mg by mouth daily as needed for anxiety.    carvedilol (COREG) 3.125 MG tablet Take 3.125 mg by mouth 2 (two) times daily.   cholecalciferol (VITAMIN D) 1000 units tablet Take 2,000 Units by mouth daily.    furosemide (LASIX) 20 MG tablet Take 10 mg by mouth daily.    levothyroxine  (SYNTHROID) 75 MCG tablet Take 75 mcg by mouth every morning.   Multiple Vitamins-Minerals (PRESERVISION AREDS 2+MULTI VIT) CAPS Take 1 capsule by mouth 2 (two) times daily.   nitroGLYCERIN (NITROSTAT) 0.4 MG SL tablet as needed.   pravastatin (PRAVACHOL) 20 MG tablet Take 20 mg by mouth at bedtime.   sertraline (ZOLOFT) 100 MG tablet daily.   [DISCONTINUED] amLODipine (NORVASC) 5 MG tablet Take 1 tablet (5 mg total) by mouth daily.     Allergies:   Patient has no known allergies.   Social History   Socioeconomic History   Marital status: Married    Spouse name: Not on file   Number of children: Not on file   Years of education: Not on file   Highest education level: Not on file  Occupational History   Not on file  Tobacco Use   Smoking status: Never   Smokeless tobacco: Never  Vaping Use   Vaping Use: Never used  Substance and Sexual Activity   Alcohol use: No   Drug use: No   Sexual activity: Not Currently  Other Topics Concern   Not on file  Social History Narrative   Not on file   Social Determinants of Health   Financial Resource Strain: Not on file  Food Insecurity: Not on file  Transportation Needs: Not on file  Physical Activity: Not on file  Stress: Not on file  Social Connections: Not on file     Family History: The patient's family history includes Cancer in her brother; Emphysema in her father; Heart attack in her brother; Heart failure in her mother; Stroke in her brother.  ROS:   Please see the history of present illness.     All other systems reviewed and are negative.   Risk Assessment/Calculations:           Physical Exam:    VS:  BP 136/64    Pulse 87    Ht 5\' 4"  (1.626 m)    Wt 141 lb (64 kg)    LMP  (LMP Unknown)    SpO2 96%    BMI 24.20 kg/m     Wt Readings from Last 3 Encounters:  02/19/21 141 lb (64 kg)  01/31/21 141 lb 1.5 oz (64 kg)  11/15/20 140 lb (63.5 kg)     GEN:  Well nourished, well developed in no acute  distress HEENT: Normal NECK: No JVD; No carotid bruits LYMPHATICS: No lymphadenopathy CARDIAC: RRR, no murmurs, rubs, gallops RESPIRATORY:  Clear to auscultation without rales, expiratory wheezing or rhonchi  ABDOMEN: Soft, non-tender, non-distended MUSCULOSKELETAL:  No edema; No deformity  SKIN: Warm and dry NEUROLOGIC:  Alert and oriented x 3 PSYCHIATRIC:  Normal affect    EKGs/Labs/Other Studies Reviewed:    The following studies were reviewed today:  EKG 02/04/2021 Sinus rhythm 74 bpm possible left atrial enlargement.  EKG: None today.  Recent  Labs: 01/31/2021: B Natriuretic Peptide 67.8; BUN 20; Creatinine, Ser 0.60; Hemoglobin 12.3; Platelets 343; Potassium 5.2; Sodium 138  Recent Lipid Panel    Component Value Date/Time   CHOL 184 02/24/2017 1004   TRIG 179 (H) 02/24/2017 1004   HDL 53 02/24/2017 1004   CHOLHDL 3.5 02/24/2017 1004   CHOLHDL 3.3 10/29/2011 0829   VLDL 24 10/29/2011 0829   LDLCALC 95 02/24/2017 1004    ASSESSMENT & PLAN    Chest discomfort-notes chest discomfort over the last several days.  Chest discomfort reproducible with deep inspiration.  Appears to be related to pulmonary vs costochondritis versus anxiety.   Presented to the emergency department 02/01/2020 after visiting her PCP and having abnormal D-dimer.  Chest CTA showed no evidence of acute changes or PE.  Troponins low and flat, EKG showed sinus rhythm 74 bpm. Use albuterol inhaler as prescribed Stress reduction Reassured that her discomfort was not related to cardiac issues. No plans for ischemic evaluation. Heart healthy low-sodium diet  Chronic diastolic CHF-euvolemic today.  Weight stable.  No increased DOE or increased activity intolerance. Continue furosemide Heart healthy low-sodium diet-salty 6 given Increase physical activity as tolerated Elevate lower extremities when not active  Essential hypertension-BP today140/60.  Similar control at home.  Reports taking 2.5 mg of  amlodipine daily. Increase amlodipine to 5 mg daily Continue furosemide Heart healthy low-sodium diet Increase physical activity as tolerated  Bradycardia-heart rate [id number].  Denies recent episodes of lightheadedness, presyncope and syncope Maintain p.o. hydration Allow time when changing positions from laying and sitting  Headache-reports headache for the last several days.  This appears to be related to increased blood pressure versus sinus drainage versus undiagnosed sleep apnea/respiratory issues.   Disposition: Follow-up with Dr. Acie Fredrickson or APP in 3 months.       Medication Adjustments/Labs and Tests Ordered: Current medicines are reviewed at length with the patient today.  Concerns regarding medicines are outlined above.  No orders of the defined types were placed in this encounter.  Meds ordered this encounter  Medications   amLODipine (NORVASC) 5 MG tablet    Sig: Take 1 tablet (5 mg total) by mouth daily.    Dispense:  30 tablet    Refill:  6    Patient Instructions  Medication Instructions:   Increase your amlodipine to 5 mg daily.  Use your Albuterol inhaler as prescribed.  *If you need a refill on your cardiac medications before your next appointment, please call your pharmacy*  Follow-Up: At [facility], you and your health needs are our priority.  As part of our continuing mission to provide you with exceptional heart care, we have created designated Provider Care Teams.  These Care Teams include your primary Cardiologist (physician) and Advanced Practice Providers (APPs -  Physician Assistants and Nurse Practitioners) who all work together to provide you with the care you need, when you need it.  We recommend signing up for the patient portal called "MyChart".  Sign up information is provided on this After Visit Summary.  MyChart is used to connect with patients for Virtual Visits (Telemedicine).  Patients are able to view lab/test results, encounter notes,  upcoming appointments, etc.  Non-urgent messages can be sent to your provider as well.   To learn more about what you can do with MyChart, go to [email or web address].    Your next appointment:   3 month(s)  The format for your next appointment:   In Person  Provider:   Dr. Acie Fredrickson or  APP {   Other Instructions If your breathing is not better in about 2-3 weeks, please call your Primary Care Provider.  Coletta Memos, NP has recommended following a low sodium diet and monitoring your blood pressure every day. Please review the following information. Thank you!  Tips to Measure your Blood Pressure Correctly  To determine whether you have hypertension, a medical professional will take a blood pressure reading. How you prepare for the test, the position of your arm, and other factors can change a blood pressure reading by 10% or more. That could be enough to hide high blood pressure, start you on a drug you don't really need, or lead your doctor to incorrectly adjust your medications.  National and international guidelines offer specific instructions for measuring blood pressure. If a doctor, nurse, or medical assistant isn't doing it right, don't hesitate to ask him or her to get with the guidelines.  Here's what you can do to ensure a correct reading:  Don't drink a caffeinated beverage or smoke during the 30 minutes before the test.  Sit quietly for five minutes before the test begins.  During the measurement, sit in a chair with your feet on the floor and your arm supported so your elbow is at about heart level.  The inflatable part of the cuff should completely cover at least 80% of your upper arm, and the cuff should be placed on bare skin, not over a shirt.  Don't talk during the measurement.  Have your blood pressure measured twice, with a brief break in between. If the readings are different by 5 points or more, have it done a third time.  In 2017, new guidelines from the  [place], the [employer] of Cardiology, and nine other health organizations lowered the diagnosis of high blood pressure to 130/80 mm Hg or higher for all adults. The guidelines also redefined the various blood pressure categories to now include normal, elevated, Stage 1 hypertension, Stage 2 hypertension, and hypertensive crisis (see "Blood pressure categories").  Blood pressure categories  Blood pressure category SYSTOLIC (upper number)  DIASTOLIC (lower number)  Normal Less than 120 mm Hg and Less than 80 mm Hg  Elevated 120-129 mm Hg and Less than 80 mm Hg  High blood pressure: Stage 1 hypertension 130-139 mm Hg or 80-89 mm Hg  High blood pressure: Stage 2 hypertension 140 mm Hg or higher or 90 mm Hg or higher  Hypertensive crisis (consult your doctor immediately) Higher than 180 mm Hg and/or Higher than 120 mm Hg  Source: American Heart Association and American Stroke Association. For more on getting your blood pressure under control, buy Controlling Your Blood Pressure, a Special Health Report from [facility].         Signed, Deberah Pelton, NP  02/19/2021 12:47 PM      Notice: This dictation was prepared with Dragon dictation along with smaller phrase technology. Any transcriptional errors that result from this process are unintentional and may not be corrected upon review.  I spent 14 minutes examining this patient, reviewing medications, and using patient centered shared decision making involving her cardiac care.  Prior to her visit I spent greater than 20 minutes reviewing her past medical history,  medications, and prior cardiac tests.

## 2021-02-19 ENCOUNTER — Ambulatory Visit (INDEPENDENT_AMBULATORY_CARE_PROVIDER_SITE_OTHER): Payer: Medicare Other | Admitting: General Practice

## 2021-02-19 ENCOUNTER — Other Ambulatory Visit: Payer: Self-pay

## 2021-02-19 ENCOUNTER — Encounter (HOSPITAL_BASED_OUTPATIENT_CLINIC_OR_DEPARTMENT_OTHER): Payer: Self-pay | Admitting: General Practice

## 2021-02-19 VITALS — BP 136/64 | HR 87 | Ht 64.0 in | Wt 141.0 lb

## 2021-02-19 DIAGNOSIS — I5032 Chronic diastolic (congestive) heart failure: Secondary | ICD-10-CM | POA: Diagnosis not present

## 2021-02-19 DIAGNOSIS — I209 Angina pectoris, unspecified: Secondary | ICD-10-CM

## 2021-02-19 DIAGNOSIS — R001 Bradycardia, unspecified: Secondary | ICD-10-CM | POA: Diagnosis not present

## 2021-02-19 DIAGNOSIS — I10 Essential (primary) hypertension: Secondary | ICD-10-CM | POA: Diagnosis not present

## 2021-02-19 MED ORDER — AMLODIPINE BESYLATE 5 MG PO TABS: 5.0000 mg | ORAL_TABLET | Freq: Every day | ORAL | 6 refills | Status: DC

## 2021-02-19 NOTE — Patient Instructions (Signed)
Medication Instructions:   Increase your amlodipine to 5 mg daily.  Use your Albuterol inhaler as prescribed.  *If you need a refill on your cardiac medications before your next appointment, please call your pharmacy*  Follow-Up: At [facility]: [facility], you and your health needs are our priority.  As part of our continuing mission to provide you with exceptional heart care, we have created designated Provider Care Teams.  These Care Teams include your primary Cardiologist (physician) and Advanced Practice Providers (APPs -  Physician Assistants and Nurse Practitioners) who all work together to provide you with the care you need, when you need it.  We recommend signing up for the patient portal called "MyChart".  Sign up information is provided on this After Visit Summary.  MyChart is used to connect with patients for Virtual Visits (Telemedicine).  Patients are able to view lab/test results, encounter notes, upcoming appointments, etc.  Non-urgent messages can be sent to your provider as well.   To learn more about what you can do with MyChart, go to [email or web address].    Your next appointment:   3 month(s)  The format for your next appointment:   In Person  Provider:   Dr. Acie Fredrickson or APP {   Other Instructions If your breathing is not better in about 2-3 weeks, please call your Primary Care Provider.  Coletta Memos, NP has recommended following a low sodium diet and monitoring your blood pressure every day. Please review the following information. Thank you!  Tips to Measure your Blood Pressure Correctly  To determine whether you have hypertension, a medical professional will take a blood pressure reading. How you prepare for the test, the position of your arm, and other factors can change a blood pressure reading by 10% or more. That could be enough to hide high blood pressure, start you on a drug you don't really need, or lead your doctor to incorrectly adjust your  medications.  National and international guidelines offer specific instructions for measuring blood pressure. If a doctor, nurse, or medical assistant isn't doing it right, don't hesitate to ask him or her to get with the guidelines.  Here's what you can do to ensure a correct reading:  Don't drink a caffeinated beverage or smoke during the 30 minutes before the test.  Sit quietly for five minutes before the test begins.  During the measurement, sit in a chair with your feet on the floor and your arm supported so your elbow is at about heart level.  The inflatable part of the cuff should completely cover at least 80% of your upper arm, and the cuff should be placed on bare skin, not over a shirt.  Don't talk during the measurement.  Have your blood pressure measured twice, with a brief break in between. If the readings are different by 5 points or more, have it done a third time.  In 2017, new guidelines from the [place], the [employer] of Cardiology, and nine other health organizations lowered the diagnosis of high blood pressure to 130/80 mm Hg or higher for all adults. The guidelines also redefined the various blood pressure categories to now include normal, elevated, Stage 1 hypertension, Stage 2 hypertension, and hypertensive crisis (see "Blood pressure categories").  Blood pressure categories  Blood pressure category SYSTOLIC (upper number)  DIASTOLIC (lower number)  Normal Less than 120 mm Hg and Less than 80 mm Hg  Elevated 120-129 mm Hg and Less than 80 mm Hg  High blood pressure: Stage  1 hypertension 130-139 mm Hg or 80-89 mm Hg  High blood pressure: Stage 2 hypertension 140 mm Hg or higher or 90 mm Hg or higher  Hypertensive crisis (consult your doctor immediately) Higher than 180 mm Hg and/or Higher than 120 mm Hg  Source: American Heart Association and American Stroke Association. For more on getting your blood pressure under control, buy Controlling Your  Blood Pressure, a Special Health Report from [facility].

## 2021-03-04 DIAGNOSIS — I10 Essential (primary) hypertension: Secondary | ICD-10-CM | POA: Diagnosis not present

## 2021-03-04 DIAGNOSIS — R0602 Shortness of breath: Secondary | ICD-10-CM | POA: Diagnosis not present

## 2021-03-04 DIAGNOSIS — F419 Anxiety disorder, unspecified: Secondary | ICD-10-CM | POA: Diagnosis not present

## 2021-03-05 ENCOUNTER — Telehealth: Payer: Self-pay | Admitting: Pulmonary Disease

## 2021-03-20 DIAGNOSIS — D3132 Benign neoplasm of left choroid: Secondary | ICD-10-CM | POA: Diagnosis not present

## 2021-03-20 DIAGNOSIS — H353132 Nonexudative age-related macular degeneration, bilateral, intermediate dry stage: Secondary | ICD-10-CM | POA: Diagnosis not present

## 2021-03-20 DIAGNOSIS — H5201 Hypermetropia, right eye: Secondary | ICD-10-CM | POA: Diagnosis not present

## 2021-03-20 DIAGNOSIS — H26492 Other secondary cataract, left eye: Secondary | ICD-10-CM | POA: Diagnosis not present

## 2021-03-21 ENCOUNTER — Encounter: Payer: Self-pay | Admitting: Internal Medicine

## 2021-03-21 ENCOUNTER — Telehealth: Payer: Self-pay | Admitting: Internal Medicine

## 2021-03-21 ENCOUNTER — Ambulatory Visit (INDEPENDENT_AMBULATORY_CARE_PROVIDER_SITE_OTHER): Payer: Medicare Other | Admitting: Internal Medicine

## 2021-03-21 ENCOUNTER — Other Ambulatory Visit: Payer: Self-pay

## 2021-03-21 VITALS — BP 136/86 | HR 95 | Wt 139.6 lb

## 2021-03-21 DIAGNOSIS — J849 Interstitial pulmonary disease, unspecified: Secondary | ICD-10-CM

## 2021-03-21 MED ORDER — ALBUTEROL SULFATE (2.5 MG/3ML) 0.083% IN NEBU: 2.5000 mg | INHALATION_SOLUTION | Freq: Four times a day (QID) | RESPIRATORY_TRACT | 3 refills | Status: DC | PRN

## 2021-03-21 NOTE — Telephone Encounter (Signed)
I called the patient and the phone rang and rang with no answer.  ? ?

## 2021-03-21 NOTE — Patient Instructions (Addendum)
Please schedule follow up scheduled with myself in 2 months.  If my schedule is not open yet, we will contact you with a reminder closer to that time. Please call [phone] if you haven't heard from [place] a month before.  ? ?Before your next visit I would like you to have: ?Full set of PFTs - 1 hour, appointment with me after. ? ?I am getting you a nebulizer machine for home. You can take breathing treatments from this as needed for chest pain and shortness of breath. It is the same medicine as your inhaler. ? ?Your lungs show interstitial lung disease also known as pulmonary fibrosis.  There are 100s of reasons to have scarring in the lungs.  I am not exactly sure what is causing yours but I am asking you to fill out this packet to see if we can find out.  It may be related to something you were exposed to in your life.  I expect it has been there for years and has been slowly progressing. We can obtain some breathing testing to determine the severity.  I walked you today to see if he would need oxygen while you are walking around.  You did not.   ? ?It is very likely that the scarring in your lungs is slowly progressive.  Unfortunately there are not good treatment options for most people with pulmonary fibrosis.  The 2 medications that are approved by the FDA do have side effects including abdominal pain, bloating, and diarrhea.  These medications do not reverse lung disease, but can help slow down the progression in some patients. ? ?We can get some breathing testing and try the albuterol inhaler to see if that gives you some improvement in your quality of life.  I would recommend we not be very aggressive with work-up and treatment based on advanced age and the relatively mild disease.  I am happy to do anything I can do to help improve your quality of life on a day-to-day basis. ? ? ? ?

## 2021-03-21 NOTE — Progress Notes (Signed)
? ?      ?[name]    [id number]    12-22-1921 ? ?Primary Care Physician:[facility] ? ?Referring Physician: Lawerance Cruel, MD ?[address],  [place] [address] ?Reason for Consultation: shortness of breath ?Date of Consultation: 03/21/2021 ? ?Chief complaint:   ?Chief Complaint  ?Patient presents with  ? Shortness of Breath  ?  ? ?HPI: ?[name] is a 88 y.o. who presents with shortness of breath progressive over the last 3 months.  ? ?Bending over to open drawers, make the bed, get dressed, take a shower.  ? ?Denies coughing. She does have chest pressure and tightness. Symptoms occur at rest and exertion. They do not wake her up in her sleep.  ? ?She has had minor breathing problems for several years and she does have an inhaler that helps her breathing.  ? ?She also takes nitroglycerin for her symptoms if the inhaler doesn't help. The nitroglycerin also helps her symptoms.  ? ?No prior pneumonia or bronchitis. ?No childhood respiratory disease.  ? ? ?Social history: ? ?Occupation: Grew up in [place], worked as a Education officer, museum. Lived in [place], [place], [place]. 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 Has been exposed to tuberculosis, but never had it ?Exposures: She is independent living with her husband.  ?Smoking history: passive smoke exposure in childhood.  ? ? ?Social History  ? ?Occupational History  ? Not on file  ?Tobacco Use  ? Smoking status: Never  ? Smokeless tobacco: Never  ?Vaping Use  ? Vaping Use: Never used  ?Substance and Sexual Activity  ? Alcohol use: No  ? Drug use: No  ? Sexual activity: Not Currently  ? ? ?Relevant family history: ? ?Family History  ?Problem Relation Age of Onset  ? Emphysema Father   ? Heart failure Mother   ? Heart attack Brother   ? Cancer Brother   ?     kidney  ? Stroke Brother   ? ? ?Past Medical History:  ?Diagnosis Date  ? Anxiety   ? Aortic stenosis   ? a. mild by echo 2017.  ? Arthritis   ?  Bradycardia   ? a. ? details not clear - in the past has had PVCs which have lowered effective HR count by pulse check.  ? Chronic diastolic CHF (congestive heart failure) ([facility])   ? Diastolic dysfunction   ? mild   ? Frequent PVCs   ? a. 48 Hour Holter 10/08/15 showed NSR and sinus tach with frequent PVCs, occasional bigeminy, total PVC burden 16.5%, lowest HR 59.  ? Hyperlipidemia   ? Hypertension   ? Hypertensive heart disease   ? Hypothyroidism   ? Kidney stones   ? Mild mitral regurgitation by prior echocardiogram   ? Tachycardia   ? history of tachycardia and bradycardia  ? ? ?Past Surgical History:  ?Procedure Laterality Date  ? APPENDECTOMY    ? CHOLECYSTECTOMY    ? EXTRACORPOREAL SHOCK WAVE LITHOTRIPSY    ? EYE SURGERY Bilateral   ? cataract surgery with lens implant  ? ORIF HUMERUS FRACTURE Right 10/31/2013  ? Procedure: Open Reduction Internal Fixation Right Proximal Humerus Fracture;  Surgeon: Marybelle Killings, MD;  Location: [facility];  Service: Orthopedics;  Laterality: Right;  ? TONSILLECTOMY    ? TOTAL ABDOMINAL HYSTERECTOMY    ? ? ? ?Physical Exam: ?Blood pressure 136/86, pulse 95, weight 139 lb 9.6 oz (63.3 kg),  SpO2 95 %. ?Gen:      No acute distress, well preserved ?ENT:  no nasal polyps, mucus membranes moist ?Lungs:    mild kyphosis. Bibasilar crackles ?CV:       tachycardic, regular, systolic murmur ?Abd:      + bowel sounds; soft, non-tender; no distension ?MSK: no acute synovitis of DIP or PIP joints, no mechanics hands.  ?Skin:      Warm and dry; no rashes ?Neuro: normal speech, no focal facial asymmetry ?Psych: alert and oriented x3, normal mood and affect ? ? ?Data Reviewed/Medical Decision Making: ? ?Independent interpretation of tests: ?Imaging: ? Review of patient's CTPE study Jan 2023 images revealed ground glass, mosaic attenuation, peribronchovascular nodular opacities suspicious for chronic HP. The patient's images have been independently reviewed by me.   ? ?PFTs: ? ?No flowsheet data  found. ? ?Labs:  ?Lab Results  ?Component Value Date  ? WBC 9.3 01/31/2021  ? HGB 12.3 01/31/2021  ? HCT 38.0 01/31/2021  ? MCV 98.4 01/31/2021  ? PLT 343 01/31/2021  ? ?Lab Results  ?Component Value Date  ? NA 138 01/31/2021  ? K 5.2 (H) 01/31/2021  ? CL 104 01/31/2021  ? CO2 27 01/31/2021  ? ? ? ?Immunization status:  ? ?There is no immunization history on file for this patient. ? ? I reviewed prior external note(s) from ED, PCP ? I reviewed the result(s) of the labs and imaging as noted above.  ? I have ordered PFT ? ?Discussion of management or test interpretation with another colleague.  ? ?Assessment:  ?Interstitial Lung Disease, new diagnosis ?Suspected chronic HP vs NSIP based on mosaic attenuation, ground glass. There is a peribronchovascular distribution.  ? ?Plan/Recommendations: ? ?Your lungs show interstitial lung disease also known as pulmonary fibrosis.  There are 100s of reasons to have scarring in the lungs.  I am not exactly sure what is causing yours but I am asking you to fill out this packet to see if we can find out.  It may be related to something you were exposed to in your life.  I expect it has been there for years and has been slowly progressing. We can obtain some breathing testing to determine the severity.  I walked you today to see if he would need oxygen while you are walking around.  You did not.   ? ?It is very likely that the scarring in your lungs is slowly progressive.  Unfortunately there are not good treatment options for most people with pulmonary fibrosis.  The 2 medications that are approved by the FDA do have side effects including abdominal pain, bloating, and diarrhea.  These medications do not reverse lung disease, but can help slow down the progression in some patients. ? ?We can get some breathing testing and try the albuterol nebulizer to see if that gives you some improvement in your quality of life.  I would recommend we not be very aggressive with work-up and  treatment based on advanced age and the relatively mild disease.  I am happy to do anything I can do to help improve your quality of life on a day-to-day basis. ? ?We discussed disease management and progression at length today.  ? ?I spent 45 minutes in the care of this patient today including pre-charting, chart review, review of results, face-to-face care, coordination of care and communication with consultants etc.). ? ?Return to Care: ?Return in about 2 months (around 05/21/2021). ? ?Lenice Llamas, MD ?Pulmonary and Critical Care  Medicine ?[place] ?Office:[phone] ? ?CC: Lawerance Cruel, MD ? ? ? ?

## 2021-03-22 NOTE — Addendum Note (Signed)
Addended by: Elby Beck R on: 03/22/2021 03:53 PM ? ? Modules accepted: Orders ? ?

## 2021-03-22 NOTE — Telephone Encounter (Signed)
Spoke with patient great granddaughter who states that they were here yesterday for OV and that they got nebulizer medication but they were not told how to go about getting nebulizer machine. Advised her that order would be sent to local DME company and that they would reach out to them to get the machine to patient. She expressed understanding. Nothing further needed at this time.  ?

## 2021-03-29 DIAGNOSIS — I10 Essential (primary) hypertension: Secondary | ICD-10-CM | POA: Diagnosis not present

## 2021-03-29 DIAGNOSIS — E7800 Pure hypercholesterolemia, unspecified: Secondary | ICD-10-CM | POA: Diagnosis not present

## 2021-03-29 DIAGNOSIS — I5032 Chronic diastolic (congestive) heart failure: Secondary | ICD-10-CM | POA: Diagnosis not present

## 2021-03-29 DIAGNOSIS — E039 Hypothyroidism, unspecified: Secondary | ICD-10-CM | POA: Diagnosis not present

## 2021-04-15 DIAGNOSIS — H903 Sensorineural hearing loss, bilateral: Secondary | ICD-10-CM | POA: Diagnosis not present

## 2021-04-23 NOTE — Telephone Encounter (Signed)
Seems like encounter was open in error so closing encounter.  

## 2021-04-29 DIAGNOSIS — L218 Other seborrheic dermatitis: Secondary | ICD-10-CM | POA: Diagnosis not present

## 2021-04-29 DIAGNOSIS — L570 Actinic keratosis: Secondary | ICD-10-CM | POA: Diagnosis not present

## 2021-04-29 DIAGNOSIS — L821 Other seborrheic keratosis: Secondary | ICD-10-CM | POA: Diagnosis not present

## 2021-04-29 DIAGNOSIS — L814 Other melanin hyperpigmentation: Secondary | ICD-10-CM | POA: Diagnosis not present

## 2021-04-29 DIAGNOSIS — B3783 Candidal cheilitis: Secondary | ICD-10-CM | POA: Diagnosis not present

## 2021-04-29 DIAGNOSIS — D229 Melanocytic nevi, unspecified: Secondary | ICD-10-CM | POA: Diagnosis not present

## 2021-05-16 ENCOUNTER — Ambulatory Visit: Payer: Medicare Other | Admitting: Internal Medicine

## 2021-05-16 ENCOUNTER — Ambulatory Visit: Payer: Medicare Other

## 2021-05-16 DIAGNOSIS — J849 Interstitial pulmonary disease, unspecified: Secondary | ICD-10-CM

## 2021-05-17 LAB — PULMONARY FUNCTION TEST
FEF 25-75 Pre: 1.41 L/sec
FEF2575-%Pred-Pre: 290 %
FEV1-%Pred-Pre: 106 %
FEV1-Pre: 1.38 L
FEV1FVC-%Pred-Pre: 114 %
FEV6-%Pred-Pre: 104 %
FEV6-Pre: 1.71 L
FEV6FVC-%Pred-Pre: 107 %
FVC-%Pred-Pre: 97 %
FVC-Pre: 1.73 L
Pre FEV1/FVC ratio: 80 %
Pre FEV6/FVC Ratio: 99 %

## 2021-05-22 ENCOUNTER — Ambulatory Visit (HOSPITAL_BASED_OUTPATIENT_CLINIC_OR_DEPARTMENT_OTHER): Payer: Medicare Other | Admitting: General Practice

## 2021-06-04 ENCOUNTER — Encounter (HOSPITAL_BASED_OUTPATIENT_CLINIC_OR_DEPARTMENT_OTHER): Payer: Self-pay | Admitting: Family

## 2021-06-04 ENCOUNTER — Ambulatory Visit (INDEPENDENT_AMBULATORY_CARE_PROVIDER_SITE_OTHER): Payer: Medicare Other | Admitting: Family

## 2021-06-04 VITALS — BP 132/60 | HR 82 | Ht 64.5 in | Wt 138.0 lb

## 2021-06-04 DIAGNOSIS — I5032 Chronic diastolic (congestive) heart failure: Secondary | ICD-10-CM

## 2021-06-04 DIAGNOSIS — J849 Interstitial pulmonary disease, unspecified: Secondary | ICD-10-CM | POA: Diagnosis not present

## 2021-06-04 DIAGNOSIS — I10 Essential (primary) hypertension: Secondary | ICD-10-CM

## 2021-06-04 DIAGNOSIS — R001 Bradycardia, unspecified: Secondary | ICD-10-CM

## 2021-06-04 DIAGNOSIS — R0789 Other chest pain: Secondary | ICD-10-CM

## 2021-06-04 NOTE — Patient Instructions (Signed)
Medication Instructions:  Your Physician recommend you continue on your current medication as directed.    *If you need a refill on your cardiac medications before your next appointment, please call your pharmacy*   Lab Work: None ordered today   Testing/Procedures: None ordered today    Follow-Up: At [facility], you and your health needs are our priority.  As part of our continuing mission to provide you with exceptional heart care, we have created designated Provider Care Teams.  These Care Teams include your primary Cardiologist (physician) and Advanced Practice Providers (APPs -  Physician Assistants and Nurse Practitioners) who all work together to provide you with the care you need, when you need it.  We recommend signing up for the patient portal called "MyChart".  Sign up information is provided on this After Visit Summary.  MyChart is used to connect with patients for Virtual Visits (Telemedicine).  Patients are able to view lab/test results, encounter notes, upcoming appointments, etc.  Non-urgent messages can be sent to your provider as well.   To learn more about what you can do with MyChart, go to [email or web address].    Your next appointment:   Follow up as scheduled   Other Instructions Heart Healthy Diet Recommendations: A low-salt diet is recommended. Meats should be grilled, baked, or boiled. Avoid fried foods. Focus on lean protein sources like fish or chicken with vegetables and fruits. The American Heart Association is a Microbiologist!  American Heart Association Diet and Lifeystyle Recommendations   Exercise recommendations: The American Heart Association recommends 150 minutes of moderate intensity exercise weekly. Try 30 minutes of moderate intensity exercise 4-5 times per week. This could include walking, jogging, or swimming.   Important Information About Sugar

## 2021-06-04 NOTE — Progress Notes (Unsigned)
Office Visit    Patient [name] Date of Encounter: 06/04/2021  PCP:  [facility]  Cardiologist:  Mertie Moores, MD  Advanced Practice Provider:  No care team member to display Electrophysiologist:  None      Chief Complaint    [name] is a 88 y.o. female with a hx of chronic diastolic heart failure, near-syncope, frequent PVC, hypertension, hypothyroidism, UTI, dyspnea on exertion, bradycardia  presents today for hypertension follow up   Past Medical History    Past Medical History:  Diagnosis Date   Anxiety    Aortic stenosis    a. mild by echo 2017.   Arthritis    Bradycardia    a. ? details not clear - in the past has had PVCs which have lowered effective HR count by pulse check.   Chronic diastolic CHF (congestive heart failure) (H[facility])    Diastolic dysfunction    mild    Frequent PVCs    a. 48 Hour Holter 10/08/15 showed NSR and sinus tach with frequent PVCs, occasional bigeminy, total PVC burden 16.5%, lowest HR 59.   Hyperlipidemia    Hypertension    Hypertensive heart disease    Hypothyroidism    Kidney stones    Mild mitral regurgitation by prior echocardiogram    Tachycardia    history of tachycardia and bradycardia   Past Surgical History:  Procedure Laterality Date   APPENDECTOMY     CHOLECYSTECTOMY     EXTRACORPOREAL SHOCK WAVE LITHOTRIPSY     EYE SURGERY Bilateral    cataract surgery with lens implant   ORIF HUMERUS FRACTURE Right 10/31/2013   Procedure: Open Reduction Internal Fixation Right Proximal Humerus Fracture;  Surgeon: Marybelle Killings, MD;  Location: [facility];  Service: Orthopedics;  Laterality: Right;   TONSILLECTOMY     TOTAL ABDOMINAL HYSTERECTOMY     Allergies  No Known Allergies  History of Present Illness    [name] is a 88 y.o. female with a hx of chronic diastolic heart failure, near-syncope, frequent PVC, hypertension, hypothyroidism, UTI, dyspnea  on exertion, bradycardia last seen 02/19/2021 by Coletta Memos, NP.  Present[facility] ED [facility]/31/2021 with concern for abnormal D-dimer.  CT angio chest with no evidence of PE and showed prior cholecystectomy.  High sensitive troponins were low and flat.  She had increased DOE with exertional activity.  No lower extremity pain or swelling and no history of DVT/PE.  She was seen in follow-up 02/19/2021.  She noted headache and mild chest pressure intermittently throughout the day.  She had wheezing on exam.  Her blood pressure was elevated.  Her amlodipine was increased to 5 mg daily.  She was encouraged to use her as needed albuterol as prescribed.  Presents today for follow up with her daughter in law. Lives in [place] with her husband. Overall stays active participating in various activities. Notes mild exertional dyspnea which is stable at baseline. She is following with pulmonology for mild ILD with plans for PFT, PRN albuterol, and symptoms management. She notes occasional chest discomfort that it "not quite a pain" both at rest and with activity occurring rarely that lasts about 30 seconds. She attributes this most often when she is short of breath. Reassurance provided that it is not typical of cardiac etiology. No near syncope, orthopnea, edema, PND.    EKGs/Labs/Other Studies Reviewed:   The following studies were  reviewed today:   EKG: No EKG today  Recent Labs: 01/31/2021: B Natriuretic Peptide 67.8; BUN 20; Creatinine, Ser 0.60; Hemoglobin 12.3; Platelets 343; Potassium 5.2; Sodium 138  Recent Lipid Panel    Component Value Date/Time   CHOL 184 02/24/2017 1004   TRIG 179 (H) 02/24/2017 1004   HDL 53 02/24/2017 1004   CHOLHDL 3.5 02/24/2017 1004   CHOLHDL 3.3 10/29/2011 0829   VLDL 24 10/29/2011 0829   LDLCALC 95 02/24/2017 1004   Home Medications   Current Meds  Medication Sig   acetaminophen (TYLENOL) 500 MG tablet Take 500-1,000 mg by mouth every 6 (six) hours as  needed (for pain or headaches).   albuterol (PROVENTIL) (2.5 MG/3ML) 0.083% nebulizer solution Take 3 mLs (2.5 mg total) by nebulization every 6 (six) hours as needed for wheezing or shortness of breath.   albuterol (VENTOLIN HFA) 108 (90 Base) MCG/ACT inhaler Inhale 2 puffs into the lungs every 6 (six) hours as needed for wheezing or shortness of breath.   amLODipine (NORVASC) 5 MG tablet Take 1 tablet (5 mg total) by mouth daily.   carvedilol (COREG) 3.125 MG tablet Take 3.125 mg by mouth 2 (two) times daily.   cholecalciferol (VITAMIN D) 1000 units tablet Take 2,000 Units by mouth daily.    furosemide (LASIX) 20 MG tablet Take 10 mg by mouth daily.    ketoconazole (NIZORAL) 2 % shampoo Apply topically 2 (two) times a week.   levothyroxine (SYNTHROID) 75 MCG tablet Take 75 mcg by mouth every morning.   Multiple Vitamins-Minerals (PRESERVISION AREDS 2+MULTI VIT) CAPS Take 1 capsule by mouth 2 (two) times daily.   pravastatin (PRAVACHOL) 20 MG tablet Take 20 mg by mouth at bedtime.   sertraline (ZOLOFT) 100 MG tablet daily.    Review of Systems      All other systems reviewed and are otherwise negative except as noted above.  Physical Exam    VS:  BP 132/60 (BP Location: Left Arm, Patient Position: Sitting, Cuff Size: Normal)   Pulse 82   Ht 5' 4.5" (1.638 m)   Wt 138 lb (62.6 kg)   LMP  (LMP Unknown)   SpO2 95%   BMI 23.32 kg/m  , BMI Body mass index is 23.32 kg/m.  Wt Readings from Last 3 Encounters:  06/04/21 138 lb (62.6 kg)  03/21/21 139 lb 9.6 oz (63.3 kg)  02/19/21 141 lb (64 kg)    GEN: Well nourished, well developed, in no acute distress. HEENT: normal. Neck: Supple, no JVD, carotid bruits, or masses. Cardiac: RRR, no  rubs, or gallops. Gr 2/6 systolic murmur. No clubbing, cyanosis, edema.  Radials/PT 2+ and equal bilaterally.  Respiratory:  Respirations regular and unlabored, clear to auscultation bilaterally. GI: Soft, nontender, nondistended. MS: No deformity or  atrophy. Skin: Warm and dry, no rash. Neuro:  Strength and sensation are intact. Psych: Normal affect.  Assessment & Plan    Chest discomfort - Atypical as it occurs at rest or with deep breathing. Suspect ILD as causative agent. She describes it as "discomfort but not pain" occurring intermittently lasting only 30 seconds. Recent chest CTA 01/2021 with no acute change, no PE, no calcifications noted on coronary arteries. Low suspicion cardiac in etiology and she is overall not bothered by it. No indication for ischemic evaluation.   Chronic diastolic heart failure - Euvolemic and well compensated on exam. Continue Coreg 3.'125mg'$  BID. No indication for loop diuretic at this time.   HTN - BP well controlled. Continue  current antihypertensive regimen.    Bradycardia - No recurrence. Would limit up-titration of Coreg though tolerates 3.'125mg'$  BID dosing without difficulty.   ILD -follows with pulmonology. Presently mild with plans for PFT and PRN albuterol.  Disposition: Follow up in 6 month(s) with Mertie Moores, MD or APP.  Signed, Loel Dubonnet, NP 06/04/2021, 1:32 PM [facility]

## 2021-06-06 ENCOUNTER — Ambulatory Visit (INDEPENDENT_AMBULATORY_CARE_PROVIDER_SITE_OTHER): Payer: Medicare Other | Admitting: Internal Medicine

## 2021-06-06 ENCOUNTER — Encounter (HOSPITAL_BASED_OUTPATIENT_CLINIC_OR_DEPARTMENT_OTHER): Payer: Self-pay | Admitting: Family

## 2021-06-06 ENCOUNTER — Encounter: Payer: Self-pay | Admitting: Internal Medicine

## 2021-06-06 VITALS — BP 120/72 | HR 77 | Temp 97.8°F | Ht 64.5 in | Wt 138.0 lb

## 2021-06-06 DIAGNOSIS — J849 Interstitial pulmonary disease, unspecified: Secondary | ICD-10-CM | POA: Diagnosis not present

## 2021-06-06 NOTE — Patient Instructions (Addendum)
Please schedule follow up scheduled with myself in 2 months.  If my schedule is not open yet, we will contact you with a reminder closer to that time. Please call [phone] if you haven't heard from [place] a month before.   Before your next visit I would like you to have: CT Chest - we will schedule for you and call you  You do have some mild scarring in your lungs but I think a conservative approach is best at this time. We will see how things have progressed in a 6 month time frame. I will see you back after the scan.

## 2021-06-06 NOTE — Progress Notes (Signed)
[name]    [id number]    13-Aug-1921  Primary Care Physician:[facility] Date of Appointment: 06/06/2021 Established Patient Visit  Chief complaint:   Chief Complaint  Patient presents with   Follow-up    She is about the same since last OV and shortness of breath with exertion.      HPI: [name] is a 88 y.o. woman with ILD.  Interval Updates: She is here for follow up. ILD packet reviewed, nothing significant revealed on this.  She is still short of breath with minimal exertion  No fevers or chills. Appetite is ok.  No new joint pains or rashes.   Could not complete PFTs  She is HOH. She is here with her great granddaughter.   I have reviewed the patient's family social and past medical history and updated as appropriate.   Past Medical History:  Diagnosis Date   Anxiety    Aortic stenosis    a. mild by echo 2017.   Arthritis    Bradycardia    a. ? details not clear - in the past has had PVCs which have lowered effective HR count by pulse check.   Chronic diastolic CHF (congestive heart failure) ([facility])    Diastolic dysfunction    mild    Frequent PVCs    a. 48 Hour Holter 10/08/15 showed NSR and sinus tach with frequent PVCs, occasional bigeminy, total PVC burden 16.5%, lowest HR 59.   Hyperlipidemia    Hypertension    Hypertensive heart disease    Hypothyroidism    Kidney stones    Mild mitral regurgitation by prior echocardiogram    Tachycardia    history of tachycardia and bradycardia    Past Surgical History:  Procedure Laterality Date   APPENDECTOMY     CHOLECYSTECTOMY     EXTRACORPOREAL SHOCK WAVE LITHOTRIPSY     EYE SURGERY Bilateral    cataract surgery with lens implant   ORIF HUMERUS FRACTURE Right 10/31/2013   Procedure: Open Reduction Internal Fixation Right Proximal Humerus Fracture;  Surgeon: Marybelle Killings, MD;  Location: [facility];  Service: Orthopedics;  Laterality: Right;   TONSILLECTOMY      TOTAL ABDOMINAL HYSTERECTOMY      Family History  Problem Relation Age of Onset   Emphysema Father    Heart failure Mother    Heart attack Brother    Cancer Brother        kidney   Stroke Brother     Social History   Occupational History   Not on file  Tobacco Use   Smoking status: Never   Smokeless tobacco: Never  Vaping Use   Vaping Use: Never used  Substance and Sexual Activity   Alcohol use: No   Drug use: No   Sexual activity: Not Currently     Physical Exam: Blood pressure 120/72, pulse 77, temperature 97.8 F (36.6 C), temperature source Oral, height 5' 4.5" (1.638 m), weight 138 lb (62.6 kg), SpO2 97 %.  Gen:      No acute distress, HOH, in wheelchair ENT:  no nasal polyps, mucus membranes moist Lungs:    No increased respiratory effort, symmetric chest wall excursion, clear to auscultation bilaterally, no wheezes or crackles CV:         Regular rate and rhythm; no murmurs, rubs, or gallops.  No pedal edema   Data Reviewed: Imaging: I have personally reviewed the CT Chest  from Jan 2023 which shows peripheral reticular changes, patchy ground glass, no honeycombing,   PFTs:     Latest Ref Rng & Units 05/16/2021    2:05 PM  PFT Results  FVC-Pre L 1.73    FVC-Predicted Pre % 97    Pre FEV1/FVC % % 80    FEV1-Pre L 1.38    FEV1-Predicted Pre % 106     Could not complete PFTs.  Labs:  Immunization status: Immunization History  Administered Date(s) Administered   Influenza Split 09/15/2008, 10/11/2009, 09/09/2013, 11/17/2017, 10/29/2020   Influenza, High Dose Seasonal PF 10/15/2015, 10/13/2016, 11/17/2017, 10/28/2018   Moderna Sars-Covid-2 Vaccination 01/17/2019, 02/14/2019, 11/16/2019, 06/12/2020   Pfizer Covid-19 Vaccine Bivalent Booster 3yr & up 10/02/2020   Pneumococcal Conjugate-13 08/01/2013   Pneumococcal Polysaccharide-23 03/05/1994   Td 06/26/2005   Tdap 01/27/2020   Zoster, Live 11/27/2011, 01/27/2020, 07/30/2020    External Records  Personally Reviewed: cardiology  Assessment:  ILD  Plan/Recommendations: Given her advanced age and limited ability to obtain diagnostic information, would favor managing this conservatively at this time. Treatment options would likely be limited to slowing disease progression and I don't think this will dramatically improve her current quality of life. Will start by repeat imaging at the 6 month interval - high res ct chest in 2 months.  I reviewed these recommendations with her and her great granddaughter and they are ok with this plan.   She can continue albuterol nebulizers as needed since she perceives some benefit.  I will see her back after CT scan.    I spent 30 minutes on 06/06/2021 in care of this patient including face to face time and non-face to face time spent charting, review of outside records, and coordination of care.   Return to Care: Return in about 2 months (around 08/06/2021).   NLenice Llamas MD [facility]

## 2021-06-25 DIAGNOSIS — L570 Actinic keratosis: Secondary | ICD-10-CM | POA: Diagnosis not present

## 2021-06-25 DIAGNOSIS — D229 Melanocytic nevi, unspecified: Secondary | ICD-10-CM | POA: Diagnosis not present

## 2021-06-25 DIAGNOSIS — L218 Other seborrheic dermatitis: Secondary | ICD-10-CM | POA: Diagnosis not present

## 2021-06-25 DIAGNOSIS — L821 Other seborrheic keratosis: Secondary | ICD-10-CM | POA: Diagnosis not present

## 2021-06-25 DIAGNOSIS — B3783 Candidal cheilitis: Secondary | ICD-10-CM | POA: Diagnosis not present

## 2021-07-04 DIAGNOSIS — I10 Essential (primary) hypertension: Secondary | ICD-10-CM | POA: Diagnosis not present

## 2021-07-04 DIAGNOSIS — R011 Cardiac murmur, unspecified: Secondary | ICD-10-CM | POA: Diagnosis not present

## 2021-07-04 DIAGNOSIS — F411 Generalized anxiety disorder: Secondary | ICD-10-CM | POA: Diagnosis not present

## 2021-07-04 DIAGNOSIS — F339 Major depressive disorder, recurrent, unspecified: Secondary | ICD-10-CM | POA: Diagnosis not present

## 2021-07-09 DIAGNOSIS — I10 Essential (primary) hypertension: Secondary | ICD-10-CM | POA: Diagnosis not present

## 2021-07-09 DIAGNOSIS — E7800 Pure hypercholesterolemia, unspecified: Secondary | ICD-10-CM | POA: Diagnosis not present

## 2021-07-09 DIAGNOSIS — M810 Age-related osteoporosis without current pathological fracture: Secondary | ICD-10-CM | POA: Diagnosis not present

## 2021-07-09 DIAGNOSIS — F339 Major depressive disorder, recurrent, unspecified: Secondary | ICD-10-CM | POA: Diagnosis not present

## 2021-07-09 DIAGNOSIS — E039 Hypothyroidism, unspecified: Secondary | ICD-10-CM | POA: Diagnosis not present

## 2021-07-22 ENCOUNTER — Ambulatory Visit (HOSPITAL_BASED_OUTPATIENT_CLINIC_OR_DEPARTMENT_OTHER)
Admission: RE | Admit: 2021-07-22 | Discharge: 2021-07-22 | Disposition: A | Discharge status: Home / Self Care | Payer: Medicare Other | Source: Ambulatory Visit | Attending: Internal Medicine | Admitting: Internal Medicine

## 2021-07-22 ENCOUNTER — Encounter (HOSPITAL_BASED_OUTPATIENT_CLINIC_OR_DEPARTMENT_OTHER): Payer: Self-pay

## 2021-07-22 DIAGNOSIS — I700 Atherosclerosis of aorta: Secondary | ICD-10-CM | POA: Diagnosis not present

## 2021-07-22 DIAGNOSIS — J849 Interstitial pulmonary disease, unspecified: Secondary | ICD-10-CM | POA: Diagnosis not present

## 2021-07-22 DIAGNOSIS — I3481 Nonrheumatic mitral (valve) annulus calcification: Secondary | ICD-10-CM | POA: Insufficient documentation

## 2021-07-22 DIAGNOSIS — J8489 Other specified interstitial pulmonary diseases: Secondary | ICD-10-CM | POA: Diagnosis not present

## 2021-07-22 DIAGNOSIS — I2510 Atherosclerotic heart disease of native coronary artery without angina pectoris: Secondary | ICD-10-CM | POA: Insufficient documentation

## 2021-07-22 DIAGNOSIS — J479 Bronchiectasis, uncomplicated: Secondary | ICD-10-CM | POA: Diagnosis not present

## 2021-08-02 ENCOUNTER — Ambulatory Visit (INDEPENDENT_AMBULATORY_CARE_PROVIDER_SITE_OTHER): Payer: Medicare Other | Admitting: Internal Medicine

## 2021-08-02 ENCOUNTER — Encounter: Payer: Self-pay | Admitting: Internal Medicine

## 2021-08-02 VITALS — BP 134/74 | HR 79 | Temp 98.2°F | Ht 64.0 in | Wt 136.8 lb

## 2021-08-02 DIAGNOSIS — J849 Interstitial pulmonary disease, unspecified: Secondary | ICD-10-CM

## 2021-08-02 NOTE — Patient Instructions (Signed)
Please schedule follow up scheduled with APP in 6 months.  If my schedule is not open yet, we will contact you with a reminder closer to that time. Please call [phone] if you haven't heard from [place] a month before.   You have some scarring in your lungs. I would recommend against treatment at this time because I think you will have side effects to the medication that will make things worse than any potential benefit. We will check on your oxygen but call and  see [place] sooner if you feel like breathing is getting worse more quickly.   Ok to take breathing treatments or inhaler as needed up to 4 times a day. No closer than 4 hours apart. Use the nebulizer machine at home and the inhaler when you are out of the house. We will send an order for more supplies for the nebulizer machine to [place].

## 2021-08-02 NOTE — Progress Notes (Signed)
[name]    [id number]    08-22-1921  Primary Care Physician:[facility] Date of Appointment: 08/02/2021 Established Patient Visit  Chief complaint:   Chief Complaint  Patient presents with   Follow-up    Review of CT scan       HPI: [name] is a 88 y.o. woman with ILD.  Interval Updates: Here for follow up after CT scan. HOH. Here with granddaughter. She continues to be short of breath but has improvement with albuterol inhaler.   I reviewed the findings with her and her granddaughter which took some time as the patient is considerably hard of hearing.    I have reviewed the patient's family social and past medical history and updated as appropriate.   Past Medical History:  Diagnosis Date   Anxiety    Aortic stenosis    a. mild by echo 2017.   Arthritis    Bradycardia    a. ? details not clear - in the past has had PVCs which have lowered effective HR count by pulse check.   Chronic diastolic CHF (congestive heart failure) (HCC)    Diastolic dysfunction    mild    Frequent PVCs    a. 48 Hour Holter 10/08/15 showed NSR and sinus tach with frequent PVCs, occasional bigeminy, total PVC burden 16.5%, lowest HR 59.   Hyperlipidemia    Hypertension    Hypertensive heart disease    Hypothyroidism    Kidney stones    Mild mitral regurgitation by prior echocardiogram    Tachycardia    history of tachycardia and bradycardia    Past Surgical History:  Procedure Laterality Date   APPENDECTOMY     CHOLECYSTECTOMY     EXTRACORPOREAL SHOCK WAVE LITHOTRIPSY     EYE SURGERY Bilateral    cataract surgery with lens implant   ORIF HUMERUS FRACTURE Right 10/31/2013   Procedure: Open Reduction Internal Fixation Right Proximal Humerus Fracture;  Surgeon: Marybelle Killings, MD;  Location: [facility];  Service: Orthopedics;  Laterality: Right;   TONSILLECTOMY     TOTAL ABDOMINAL HYSTERECTOMY      Family History  Problem Relation Age of  Onset   Emphysema Father    Heart failure Mother    Heart attack Brother    Cancer Brother        kidney   Stroke Brother     Social History   Occupational History   Not on file  Tobacco Use   Smoking status: Never   Smokeless tobacco: Never  Vaping Use   Vaping Use: Never used  Substance and Sexual Activity   Alcohol use: No   Drug use: No   Sexual activity: Not Currently     Physical Exam: Blood pressure 134/74, pulse 79, temperature 98.2 F (36.8 C), temperature source Oral, height '5\' 4"'$  (1.626 m), weight 136 lb 12.8 oz (62.1 kg), SpO2 94 %.  Gen:      No acute distress, HOH ENT:  no nasal polyps, mucus membranes moist Lungs:   bibasilar crackles CV:         RRR no mrg   Data Reviewed: Imaging: I have personally reviewed the CT Chest from July 2023 shows bibasilar fibrosis, they seem to follow a craniocaudal distribution. There are some areas of architectural distortion and traction bronchiectasis.   PFTs:     Latest Ref Rng & Units 05/16/2021    2:05 PM  PFT Results  FVC-Pre L 1.73   FVC-Predicted Pre % 97   Pre FEV1/FVC % % 80   FEV1-Pre L 1.38   FEV1-Predicted Pre % 106    Could not complete PFTs.  Labs:  Immunization status: Immunization History  Administered Date(s) Administered   Influenza Split 09/15/2008, 10/11/2009, 09/09/2013, 11/17/2017, 10/29/2020   Influenza, High Dose Seasonal PF 10/15/2015, 10/13/2016, 11/17/2017, 10/28/2018   Moderna Sars-Covid-2 Vaccination 01/17/2019, 02/14/2019, 11/16/2019, 06/12/2020   Pfizer Covid-19 Vaccine Bivalent Booster 46yr & up 10/02/2020   Pneumococcal Conjugate-13 08/01/2013   Pneumococcal Polysaccharide-23 03/05/1994   Td 06/26/2005   Tdap 01/27/2020   Zoster, Live 11/27/2011, 01/27/2020, 07/30/2020    External Records Personally Reviewed: cardiology  Assessment:  ILD, likey IPF  Plan/Recommendations: Mrs. BPippengerhas likely IPF based on radiographic determination of UIP. She has not been able  to complete PFTs due to difficulty performing manuevers. She is not hypoxemic. We discussed risks and benefits of initiating anti-fibrotic therapy. She already has some stomach upset at baseline and I think the GI side effects from ofev or esbriet would be intolerable for her.  She enjoys reasonably good quality of life and I think the conservative approach is better. We can check in on her periodically to make sure her she has support and to monitor disease progression. She may need ambulatory desat study at next visit.   I spent 30 minutes in the care of this patient today including pre-charting, chart review, review of results, face-to-face care, coordination of care and communication with consultants etc.).   Return to Care: Return in about 6 months (around 02/02/2022).   NLenice Llamas MD Pulmonary and [facility]

## 2021-08-15 ENCOUNTER — Other Ambulatory Visit: Payer: Self-pay | Admitting: Internal Medicine

## 2021-09-05 ENCOUNTER — Telehealth: Payer: Self-pay | Admitting: Internal Medicine

## 2021-09-05 ENCOUNTER — Other Ambulatory Visit: Payer: Self-pay | Admitting: Family Medicine

## 2021-09-05 DIAGNOSIS — I779 Disorder of arteries and arterioles, unspecified: Secondary | ICD-10-CM

## 2021-09-05 MED ORDER — ALBUTEROL SULFATE (2.5 MG/3ML) 0.083% IN NEBU: 2.5000 mg | INHALATION_SOLUTION | Freq: Four times a day (QID) | RESPIRATORY_TRACT | 3 refills | Status: DC | PRN

## 2021-09-05 NOTE — Telephone Encounter (Signed)
Rx for pt's Albuterol solution has been sent to pharmacy for pt. Called and spoke with pt's son Thayer Jew letting him know this was done and he verbalized understanding. Nothing further needed.

## 2021-09-09 ENCOUNTER — Ambulatory Visit
Admission: RE | Admit: 2021-09-09 | Discharge: 2021-09-09 | Disposition: A | Discharge status: Home / Self Care | Payer: Medicare Other | Source: Ambulatory Visit | Attending: Family Medicine | Admitting: Family Medicine

## 2021-09-09 DIAGNOSIS — I6523 Occlusion and stenosis of bilateral carotid arteries: Secondary | ICD-10-CM | POA: Diagnosis not present

## 2021-09-09 DIAGNOSIS — I779 Disorder of arteries and arterioles, unspecified: Secondary | ICD-10-CM

## 2021-09-24 DIAGNOSIS — L570 Actinic keratosis: Secondary | ICD-10-CM | POA: Diagnosis not present

## 2021-09-24 DIAGNOSIS — L579 Skin changes due to chronic exposure to nonionizing radiation, unspecified: Secondary | ICD-10-CM | POA: Diagnosis not present

## 2021-09-24 DIAGNOSIS — D229 Melanocytic nevi, unspecified: Secondary | ICD-10-CM | POA: Diagnosis not present

## 2021-09-24 DIAGNOSIS — L821 Other seborrheic keratosis: Secondary | ICD-10-CM | POA: Diagnosis not present

## 2021-09-24 DIAGNOSIS — L814 Other melanin hyperpigmentation: Secondary | ICD-10-CM | POA: Diagnosis not present

## 2021-10-14 ENCOUNTER — Other Ambulatory Visit (HOSPITAL_BASED_OUTPATIENT_CLINIC_OR_DEPARTMENT_OTHER): Payer: Self-pay | Admitting: General Practice

## 2021-10-25 DIAGNOSIS — E039 Hypothyroidism, unspecified: Secondary | ICD-10-CM | POA: Diagnosis not present

## 2021-10-25 DIAGNOSIS — F339 Major depressive disorder, recurrent, unspecified: Secondary | ICD-10-CM | POA: Diagnosis not present

## 2021-10-25 DIAGNOSIS — E7800 Pure hypercholesterolemia, unspecified: Secondary | ICD-10-CM | POA: Diagnosis not present

## 2021-10-25 DIAGNOSIS — M810 Age-related osteoporosis without current pathological fracture: Secondary | ICD-10-CM | POA: Diagnosis not present

## 2021-10-25 DIAGNOSIS — I10 Essential (primary) hypertension: Secondary | ICD-10-CM | POA: Diagnosis not present

## 2021-10-29 DIAGNOSIS — Z23 Encounter for immunization: Secondary | ICD-10-CM | POA: Diagnosis not present

## 2021-11-07 DIAGNOSIS — M549 Dorsalgia, unspecified: Secondary | ICD-10-CM | POA: Diagnosis not present

## 2021-11-07 DIAGNOSIS — Z6826 Body mass index (BMI) 26.0-26.9, adult: Secondary | ICD-10-CM | POA: Diagnosis not present

## 2021-11-14 DIAGNOSIS — Z23 Encounter for immunization: Secondary | ICD-10-CM | POA: Diagnosis not present

## 2021-11-21 DIAGNOSIS — Z6826 Body mass index (BMI) 26.0-26.9, adult: Secondary | ICD-10-CM | POA: Diagnosis not present

## 2021-11-21 DIAGNOSIS — S76012A Strain of muscle, fascia and tendon of left hip, initial encounter: Secondary | ICD-10-CM | POA: Diagnosis not present

## 2021-12-01 ENCOUNTER — Encounter: Payer: Self-pay | Admitting: Cardiovascular Disease

## 2021-12-01 NOTE — Progress Notes (Signed)
This encounter was created in error - please disregard.

## 2021-12-02 ENCOUNTER — Encounter: Payer: Medicare Other | Admitting: Cardiovascular Disease

## 2022-01-21 DIAGNOSIS — L821 Other seborrheic keratosis: Secondary | ICD-10-CM | POA: Diagnosis not present

## 2022-01-21 DIAGNOSIS — D229 Melanocytic nevi, unspecified: Secondary | ICD-10-CM | POA: Diagnosis not present

## 2022-01-21 DIAGNOSIS — L814 Other melanin hyperpigmentation: Secondary | ICD-10-CM | POA: Diagnosis not present

## 2022-01-21 DIAGNOSIS — L570 Actinic keratosis: Secondary | ICD-10-CM | POA: Diagnosis not present

## 2022-01-21 DIAGNOSIS — B3783 Candidal cheilitis: Secondary | ICD-10-CM | POA: Diagnosis not present

## 2022-01-30 DIAGNOSIS — R41841 Cognitive communication deficit: Secondary | ICD-10-CM | POA: Diagnosis not present

## 2022-02-03 DIAGNOSIS — Z0000 Encounter for general adult medical examination without abnormal findings: Secondary | ICD-10-CM | POA: Diagnosis not present

## 2022-02-03 DIAGNOSIS — Z6826 Body mass index (BMI) 26.0-26.9, adult: Secondary | ICD-10-CM | POA: Diagnosis not present

## 2022-02-04 DIAGNOSIS — R41841 Cognitive communication deficit: Secondary | ICD-10-CM | POA: Diagnosis not present

## 2022-02-11 DIAGNOSIS — R41841 Cognitive communication deficit: Secondary | ICD-10-CM | POA: Diagnosis not present

## 2022-02-25 DIAGNOSIS — L570 Actinic keratosis: Secondary | ICD-10-CM | POA: Diagnosis not present

## 2022-02-25 DIAGNOSIS — B3783 Candidal cheilitis: Secondary | ICD-10-CM | POA: Diagnosis not present

## 2022-02-26 DIAGNOSIS — R41841 Cognitive communication deficit: Secondary | ICD-10-CM | POA: Diagnosis not present

## 2022-02-27 DIAGNOSIS — N200 Calculus of kidney: Secondary | ICD-10-CM | POA: Diagnosis not present

## 2022-02-27 DIAGNOSIS — N3946 Mixed incontinence: Secondary | ICD-10-CM | POA: Diagnosis not present

## 2022-03-11 DIAGNOSIS — R41841 Cognitive communication deficit: Secondary | ICD-10-CM | POA: Diagnosis not present

## 2022-03-25 DIAGNOSIS — L219 Seborrheic dermatitis, unspecified: Secondary | ICD-10-CM | POA: Diagnosis not present

## 2022-03-25 DIAGNOSIS — B3783 Candidal cheilitis: Secondary | ICD-10-CM | POA: Diagnosis not present

## 2022-03-25 DIAGNOSIS — L905 Scar conditions and fibrosis of skin: Secondary | ICD-10-CM | POA: Diagnosis not present

## 2022-03-25 DIAGNOSIS — L570 Actinic keratosis: Secondary | ICD-10-CM | POA: Diagnosis not present

## 2022-03-25 DIAGNOSIS — R41841 Cognitive communication deficit: Secondary | ICD-10-CM | POA: Diagnosis not present

## 2022-03-25 DIAGNOSIS — D485 Neoplasm of uncertain behavior of skin: Secondary | ICD-10-CM | POA: Diagnosis not present

## 2022-03-25 DIAGNOSIS — R234 Changes in skin texture: Secondary | ICD-10-CM | POA: Diagnosis not present

## 2022-04-22 DIAGNOSIS — L570 Actinic keratosis: Secondary | ICD-10-CM | POA: Diagnosis not present

## 2022-04-22 DIAGNOSIS — B3783 Candidal cheilitis: Secondary | ICD-10-CM | POA: Diagnosis not present

## 2022-04-22 DIAGNOSIS — L905 Scar conditions and fibrosis of skin: Secondary | ICD-10-CM | POA: Diagnosis not present

## 2022-04-22 DIAGNOSIS — L853 Xerosis cutis: Secondary | ICD-10-CM | POA: Diagnosis not present

## 2022-05-27 DIAGNOSIS — L814 Other melanin hyperpigmentation: Secondary | ICD-10-CM | POA: Diagnosis not present

## 2022-05-27 DIAGNOSIS — L570 Actinic keratosis: Secondary | ICD-10-CM | POA: Diagnosis not present

## 2022-05-27 DIAGNOSIS — B3783 Candidal cheilitis: Secondary | ICD-10-CM | POA: Diagnosis not present

## 2022-05-27 DIAGNOSIS — L821 Other seborrheic keratosis: Secondary | ICD-10-CM | POA: Diagnosis not present

## 2022-06-13 DIAGNOSIS — H353132 Nonexudative age-related macular degeneration, bilateral, intermediate dry stage: Secondary | ICD-10-CM | POA: Diagnosis not present

## 2022-06-13 DIAGNOSIS — H5203 Hypermetropia, bilateral: Secondary | ICD-10-CM | POA: Diagnosis not present

## 2022-06-13 DIAGNOSIS — H04123 Dry eye syndrome of bilateral lacrimal glands: Secondary | ICD-10-CM | POA: Diagnosis not present

## 2022-06-13 DIAGNOSIS — H26492 Other secondary cataract, left eye: Secondary | ICD-10-CM | POA: Diagnosis not present

## 2022-06-13 DIAGNOSIS — H524 Presbyopia: Secondary | ICD-10-CM | POA: Diagnosis not present

## 2022-07-22 DIAGNOSIS — L570 Actinic keratosis: Secondary | ICD-10-CM | POA: Diagnosis not present

## 2022-07-22 DIAGNOSIS — B3783 Candidal cheilitis: Secondary | ICD-10-CM | POA: Diagnosis not present

## 2022-07-22 DIAGNOSIS — L820 Inflamed seborrheic keratosis: Secondary | ICD-10-CM | POA: Diagnosis not present

## 2022-08-13 DIAGNOSIS — M19042 Primary osteoarthritis, left hand: Secondary | ICD-10-CM | POA: Diagnosis not present

## 2022-08-13 DIAGNOSIS — M170 Bilateral primary osteoarthritis of knee: Secondary | ICD-10-CM | POA: Diagnosis not present

## 2022-08-13 DIAGNOSIS — M549 Dorsalgia, unspecified: Secondary | ICD-10-CM | POA: Diagnosis not present

## 2022-08-13 DIAGNOSIS — Z6827 Body mass index (BMI) 27.0-27.9, adult: Secondary | ICD-10-CM | POA: Diagnosis not present

## 2022-08-13 DIAGNOSIS — G8929 Other chronic pain: Secondary | ICD-10-CM | POA: Diagnosis not present

## 2022-08-13 DIAGNOSIS — M545 Low back pain, unspecified: Secondary | ICD-10-CM | POA: Diagnosis not present

## 2022-08-13 DIAGNOSIS — F331 Major depressive disorder, recurrent, moderate: Secondary | ICD-10-CM | POA: Diagnosis not present

## 2022-08-13 DIAGNOSIS — M25551 Pain in right hip: Secondary | ICD-10-CM | POA: Diagnosis not present

## 2022-08-13 DIAGNOSIS — M19041 Primary osteoarthritis, right hand: Secondary | ICD-10-CM | POA: Diagnosis not present

## 2022-08-14 ENCOUNTER — Other Ambulatory Visit: Payer: Self-pay | Admitting: Family Medicine

## 2022-08-14 ENCOUNTER — Ambulatory Visit
Admission: RE | Admit: 2022-08-14 | Discharge: 2022-08-14 | Disposition: A | Discharge status: Home / Self Care | Payer: Medicare Other | Source: Ambulatory Visit | Attending: Family Medicine | Admitting: Family Medicine

## 2022-08-14 DIAGNOSIS — G8929 Other chronic pain: Secondary | ICD-10-CM

## 2022-08-14 DIAGNOSIS — M25551 Pain in right hip: Secondary | ICD-10-CM

## 2022-08-26 DIAGNOSIS — L219 Seborrheic dermatitis, unspecified: Secondary | ICD-10-CM | POA: Diagnosis not present

## 2022-08-26 DIAGNOSIS — L570 Actinic keratosis: Secondary | ICD-10-CM | POA: Diagnosis not present

## 2022-09-10 DIAGNOSIS — F411 Generalized anxiety disorder: Secondary | ICD-10-CM | POA: Diagnosis not present

## 2022-09-10 DIAGNOSIS — Z6828 Body mass index (BMI) 28.0-28.9, adult: Secondary | ICD-10-CM | POA: Diagnosis not present

## 2022-09-10 DIAGNOSIS — F331 Major depressive disorder, recurrent, moderate: Secondary | ICD-10-CM | POA: Diagnosis not present

## 2022-09-10 DIAGNOSIS — I10 Essential (primary) hypertension: Secondary | ICD-10-CM | POA: Diagnosis not present

## 2022-09-10 DIAGNOSIS — M1990 Unspecified osteoarthritis, unspecified site: Secondary | ICD-10-CM | POA: Diagnosis not present

## 2022-09-10 DIAGNOSIS — H9201 Otalgia, right ear: Secondary | ICD-10-CM | POA: Diagnosis not present

## 2022-09-23 ENCOUNTER — Ambulatory Visit (INDEPENDENT_AMBULATORY_CARE_PROVIDER_SITE_OTHER): Payer: Medicare Other | Admitting: Orthopaedic Surgery

## 2022-09-23 VITALS — BP 163/78 | HR 80

## 2022-09-23 DIAGNOSIS — L219 Seborrheic dermatitis, unspecified: Secondary | ICD-10-CM | POA: Diagnosis not present

## 2022-09-23 DIAGNOSIS — M5136 Other intervertebral disc degeneration, lumbar region: Secondary | ICD-10-CM | POA: Diagnosis not present

## 2022-09-23 DIAGNOSIS — L570 Actinic keratosis: Secondary | ICD-10-CM | POA: Diagnosis not present

## 2022-09-23 NOTE — Progress Notes (Signed)
Office Visit Note   Patient: [name]           Date of Birth: [birth year]           MRN: [id number] Visit Date: 09/23/2022              Requested by: Shireen Quan, DO [address] [place],  [place] [address] PCP: Shireen Quan, DO   Assessment & Plan: Visit Diagnoses:  1. Other intervertebral disc degeneration, lumbar region     Plan: X-rays reviewed of her hand which shows arthritis primarily PIP and DIP joints.  Some carpal arthritis as well currently not really bothering her.  Only mild arthritic changes SI joint hip joint.  Multilevel disc degeneration likely, the source of her buttocks and hip pain.  She will continue her daily walking gentle stretching activities.  Follow-up if she has increased symptoms.  Follow-Up Instructions: No follow-ups on file.   Orders:  No orders of the defined types were placed in this encounter.  No orders of the defined types were placed in this encounter.     Procedures: No procedures performed   Clinical Data: No additional findings.   Subjective: Chief Complaint  Patient presents with   Lower Back - Pain   Right Hip - Pain    HPI 89 year old female has had some problems with right hip pain.  Recent x-rays lumbar hip x-rays but x-rays have been obtained.  She uses Tylenol daily once Celebrex but is a good day 2 of it is a bad day.  She has some arthritis in her fingers and wrist mostly her fingers bother her.  She walks daily she is here with her son.  Review of Systems past history of proximal humerus fracture on the right close history of heart failure thyroid condition previous CT.   Objective: Vital Signs: BP (!) 163/78   Pulse 80   LMP  (LMP Unknown)   Physical Exam Constitutional:      Appearance: She is well-developed.  HENT:     Head: Normocephalic.     Right Ear: External ear normal.     Left Ear: External ear normal. There is no impacted cerumen.  Eyes:     Pupils: Pupils are equal, round, and reactive  to light.  Neck:     Thyroid: No thyromegaly.     Trachea: No tracheal deviation.  Cardiovascular:     Rate and Rhythm: Normal rate.  Pulmonary:     Effort: Pulmonary effort is normal.  Abdominal:     Palpations: Abdomen is soft.  Musculoskeletal:     Cervical back: No rigidity.  Skin:    General: Skin is warm and dry.  Neurological:     Mental Status: She is alert and oriented to person, place, and time.  Psychiatric:        Behavior: Behavior normal.     Ortho Exam decreased flexion fingers primarily loss of the PIP joints right and left hands.  Some crepitus base of the thumb nontender first CMC joint.  Specialty Comments:  No specialty comments available.  Imaging: CLINICAL DATA: Chronic lower back pain and posterior right hip pain.  EXAM: LUMBAR SPINE - 2-3 VIEW  COMPARISON: Lumbar spine radiographs 05/11/2020  FINDINGS: There is diffuse decreased bone mineralization. There are 5 non-rib-bearing lumbar-type vertebral bodies. Minimal levocurvature centered at L2-3.  Vertebral body heights are maintained.  Moderate to high-grade left L1-2, moderate right and left L2-3, and mild right and left L3-4 endplate  osteophytes.  Moderate to severe T12-L1 through L5-S1 disc space narrowing, greatest at the T12-L1 and L3-4 levels.  Anterior endplate osteophytes greatest at T12-L1 and L3-4.  There is again a calcification measuring up to 3.3 cm overlying the left renal shadow, corresponding to the inferior left renal pelvis as seen on prior 07/24/2017 CT. Additional smaller stones are again seen within the more inferior aspect of the left kidney.  Right upper quadrant cholecystectomy clips.  Interstitial thickening and reticular opacities of chronic interstitial lung disease within the lung bases.  IMPRESSION: 1. Moderate to severe T12-L1 through L5-S1 degenerative disc and endplate changes. 2. Chronic left-sided nephrolithiasis with dominant approximately 3 cm  stone as seen on prior CT.   Electronically Signed By: Neita Garnet M.D. On: 08/20/2022 22:28    PMFS History: Patient Active Problem List   Diagnosis Date Noted   Other intervertebral disc degeneration, lumbar region 09/23/2022   No-show for appointment 04/22/2019   Frequent PVCs    Bradycardia    Hypertensive heart disease    Hyperlipidemia 02/02/2014   Fracture of humerus, proximal, right, closed 10/31/2013   Proximal humerus fracture 10/31/2013   UTI (urinary tract infection) 09/24/2013   Acute on chronic diastolic congestive heart failure (HCC) 09/24/2013   Abnormal EKG 09/24/2013   Acute on chronic diastolic heart failure (HCC) 09/24/2013   Infection of left eye 09/24/2013   Near syncope 09/24/2012   Hypothyroidism 10/30/2011   Chest pain 10/29/2011   Chronic diastolic CHF (congestive heart failure) (HCC) 07/05/2010   Nonspecific (abnormal) findings on radiological and other examination of body structure 11/12/2009   CT, CHEST, ABNORMAL 11/12/2009   DYSPNEA ON EXERTION 06/05/2009   Past Medical History:  Diagnosis Date   Anxiety    Aortic stenosis    a. mild by echo 2017.   Arthritis    Bradycardia    a. ? details not clear - in the past has had PVCs which have lowered effective HR count by pulse check.   Chronic diastolic CHF (congestive heart failure) (HCC)    Diastolic dysfunction    mild    Frequent PVCs    a. 48 Hour Holter 10/08/15 showed NSR and sinus tach with frequent PVCs, occasional bigeminy, total PVC burden 16.5%, lowest HR 59.   Hyperlipidemia    Hypertension    Hypertensive heart disease    Hypothyroidism    Kidney stones    Mild mitral regurgitation by prior echocardiogram    Tachycardia    history of tachycardia and bradycardia    Family History  Problem Relation Age of Onset   Emphysema Father    Heart failure Mother    Heart attack Brother    Cancer Brother        kidney   Stroke Brother     Past Surgical History:  Procedure  Laterality Date   APPENDECTOMY     CHOLECYSTECTOMY     EXTRACORPOREAL SHOCK WAVE LITHOTRIPSY     EYE SURGERY Bilateral    cataract surgery with lens implant   ORIF HUMERUS FRACTURE Right 10/31/2013   Procedure: Open Reduction Internal Fixation Right Proximal Humerus Fracture;  Surgeon: Eldred Manges, MD;  Location: [facility];  Service: Orthopedics;  Laterality: Right;   TONSILLECTOMY     TOTAL ABDOMINAL HYSTERECTOMY     Social History   Occupational History   Not on file  Tobacco Use   Smoking status: Never   Smokeless tobacco: Never  Vaping Use   Vaping status: Never Used  Substance and Sexual Activity   Alcohol use: No   Drug use: No   Sexual activity: Not Currently

## 2022-10-06 DIAGNOSIS — L570 Actinic keratosis: Secondary | ICD-10-CM | POA: Diagnosis not present

## 2022-10-06 DIAGNOSIS — L568 Other specified acute skin changes due to ultraviolet radiation: Secondary | ICD-10-CM | POA: Diagnosis not present

## 2022-10-29 DIAGNOSIS — Z23 Encounter for immunization: Secondary | ICD-10-CM | POA: Diagnosis not present

## 2022-11-17 DIAGNOSIS — L570 Actinic keratosis: Secondary | ICD-10-CM | POA: Diagnosis not present

## 2022-12-09 ENCOUNTER — Inpatient Hospital Stay (HOSPITAL_BASED_OUTPATIENT_CLINIC_OR_DEPARTMENT_OTHER)
Admission: EM | Admit: 2022-12-09 | Discharge: 2022-12-15 | DRG: 177 | Disposition: A | Discharge status: Skilled Nursing Facility | Payer: Medicare Other | Attending: Internal Medicine | Admitting: Internal Medicine

## 2022-12-09 ENCOUNTER — Emergency Department (HOSPITAL_BASED_OUTPATIENT_CLINIC_OR_DEPARTMENT_OTHER): Payer: Medicare Other

## 2022-12-09 ENCOUNTER — Other Ambulatory Visit: Payer: Self-pay

## 2022-12-09 DIAGNOSIS — J811 Chronic pulmonary edema: Secondary | ICD-10-CM | POA: Diagnosis not present

## 2022-12-09 DIAGNOSIS — Z66 Do not resuscitate: Secondary | ICD-10-CM | POA: Diagnosis present

## 2022-12-09 DIAGNOSIS — R0902 Hypoxemia: Secondary | ICD-10-CM | POA: Diagnosis not present

## 2022-12-09 DIAGNOSIS — Z79899 Other long term (current) drug therapy: Secondary | ICD-10-CM

## 2022-12-09 DIAGNOSIS — J690 Pneumonitis due to inhalation of food and vomit: Principal | ICD-10-CM | POA: Diagnosis present

## 2022-12-09 DIAGNOSIS — I080 Rheumatic disorders of both mitral and aortic valves: Secondary | ICD-10-CM | POA: Diagnosis not present

## 2022-12-09 DIAGNOSIS — Z87440 Personal history of urinary (tract) infections: Secondary | ICD-10-CM

## 2022-12-09 DIAGNOSIS — I110 Hypertensive heart disease with heart failure: Secondary | ICD-10-CM | POA: Diagnosis present

## 2022-12-09 DIAGNOSIS — N179 Acute kidney failure, unspecified: Secondary | ICD-10-CM | POA: Diagnosis present

## 2022-12-09 DIAGNOSIS — R630 Anorexia: Secondary | ICD-10-CM | POA: Diagnosis not present

## 2022-12-09 DIAGNOSIS — J849 Interstitial pulmonary disease, unspecified: Secondary | ICD-10-CM | POA: Diagnosis present

## 2022-12-09 DIAGNOSIS — I214 Non-ST elevation (NSTEMI) myocardial infarction: Secondary | ICD-10-CM | POA: Diagnosis not present

## 2022-12-09 DIAGNOSIS — Z90710 Acquired absence of both cervix and uterus: Secondary | ICD-10-CM

## 2022-12-09 DIAGNOSIS — R059 Cough, unspecified: Secondary | ICD-10-CM | POA: Diagnosis not present

## 2022-12-09 DIAGNOSIS — Z79890 Hormone replacement therapy (postmenopausal): Secondary | ICD-10-CM | POA: Diagnosis not present

## 2022-12-09 DIAGNOSIS — R54 Age-related physical debility: Secondary | ICD-10-CM | POA: Diagnosis present

## 2022-12-09 DIAGNOSIS — F411 Generalized anxiety disorder: Secondary | ICD-10-CM | POA: Diagnosis not present

## 2022-12-09 DIAGNOSIS — R058 Other specified cough: Secondary | ICD-10-CM | POA: Diagnosis not present

## 2022-12-09 DIAGNOSIS — E785 Hyperlipidemia, unspecified: Secondary | ICD-10-CM | POA: Diagnosis present

## 2022-12-09 DIAGNOSIS — Z9841 Cataract extraction status, right eye: Secondary | ICD-10-CM

## 2022-12-09 DIAGNOSIS — J4520 Mild intermittent asthma, uncomplicated: Secondary | ICD-10-CM | POA: Diagnosis not present

## 2022-12-09 DIAGNOSIS — Z1152 Encounter for screening for COVID-19: Secondary | ICD-10-CM | POA: Diagnosis not present

## 2022-12-09 DIAGNOSIS — E876 Hypokalemia: Secondary | ICD-10-CM | POA: Diagnosis present

## 2022-12-09 DIAGNOSIS — I5032 Chronic diastolic (congestive) heart failure: Secondary | ICD-10-CM | POA: Diagnosis present

## 2022-12-09 DIAGNOSIS — Z87442 Personal history of urinary calculi: Secondary | ICD-10-CM

## 2022-12-09 DIAGNOSIS — Z9049 Acquired absence of other specified parts of digestive tract: Secondary | ICD-10-CM

## 2022-12-09 DIAGNOSIS — I21A1 Myocardial infarction type 2: Secondary | ICD-10-CM | POA: Diagnosis present

## 2022-12-09 DIAGNOSIS — Z9842 Cataract extraction status, left eye: Secondary | ICD-10-CM

## 2022-12-09 DIAGNOSIS — J069 Acute upper respiratory infection, unspecified: Secondary | ICD-10-CM | POA: Diagnosis not present

## 2022-12-09 DIAGNOSIS — E039 Hypothyroidism, unspecified: Secondary | ICD-10-CM | POA: Diagnosis not present

## 2022-12-09 DIAGNOSIS — Z8249 Family history of ischemic heart disease and other diseases of the circulatory system: Secondary | ICD-10-CM | POA: Diagnosis not present

## 2022-12-09 DIAGNOSIS — I959 Hypotension, unspecified: Secondary | ICD-10-CM | POA: Diagnosis present

## 2022-12-09 DIAGNOSIS — Z825 Family history of asthma and other chronic lower respiratory diseases: Secondary | ICD-10-CM

## 2022-12-09 DIAGNOSIS — J45909 Unspecified asthma, uncomplicated: Secondary | ICD-10-CM | POA: Diagnosis present

## 2022-12-09 DIAGNOSIS — J189 Pneumonia, unspecified organism: Secondary | ICD-10-CM | POA: Diagnosis not present

## 2022-12-09 DIAGNOSIS — E860 Dehydration: Secondary | ICD-10-CM | POA: Diagnosis present

## 2022-12-09 DIAGNOSIS — N390 Urinary tract infection, site not specified: Secondary | ICD-10-CM | POA: Diagnosis not present

## 2022-12-09 DIAGNOSIS — R7981 Abnormal blood-gas level: Secondary | ICD-10-CM | POA: Diagnosis not present

## 2022-12-09 DIAGNOSIS — G9341 Metabolic encephalopathy: Secondary | ICD-10-CM | POA: Diagnosis not present

## 2022-12-09 DIAGNOSIS — E871 Hypo-osmolality and hyponatremia: Secondary | ICD-10-CM | POA: Diagnosis not present

## 2022-12-09 DIAGNOSIS — R918 Other nonspecific abnormal finding of lung field: Secondary | ICD-10-CM | POA: Diagnosis not present

## 2022-12-09 DIAGNOSIS — J9601 Acute respiratory failure with hypoxia: Secondary | ICD-10-CM | POA: Diagnosis present

## 2022-12-09 DIAGNOSIS — I509 Heart failure, unspecified: Secondary | ICD-10-CM

## 2022-12-09 DIAGNOSIS — R7989 Other specified abnormal findings of blood chemistry: Secondary | ICD-10-CM | POA: Diagnosis not present

## 2022-12-09 DIAGNOSIS — J90 Pleural effusion, not elsewhere classified: Secondary | ICD-10-CM | POA: Diagnosis not present

## 2022-12-09 DIAGNOSIS — Z7401 Bed confinement status: Secondary | ICD-10-CM | POA: Diagnosis not present

## 2022-12-09 DIAGNOSIS — I5031 Acute diastolic (congestive) heart failure: Secondary | ICD-10-CM | POA: Diagnosis not present

## 2022-12-09 DIAGNOSIS — M1990 Unspecified osteoarthritis, unspecified site: Secondary | ICD-10-CM | POA: Diagnosis present

## 2022-12-09 DIAGNOSIS — I5033 Acute on chronic diastolic (congestive) heart failure: Secondary | ICD-10-CM | POA: Diagnosis not present

## 2022-12-09 DIAGNOSIS — I350 Nonrheumatic aortic (valve) stenosis: Secondary | ICD-10-CM | POA: Diagnosis not present

## 2022-12-09 DIAGNOSIS — R531 Weakness: Secondary | ICD-10-CM | POA: Diagnosis not present

## 2022-12-09 DIAGNOSIS — Z823 Family history of stroke: Secondary | ICD-10-CM | POA: Diagnosis not present

## 2022-12-09 DIAGNOSIS — Z961 Presence of intraocular lens: Secondary | ICD-10-CM | POA: Diagnosis present

## 2022-12-09 LAB — BASIC METABOLIC PANEL
Anion gap: 11 (ref 5–15)
BUN: 36 mg/dL — ABNORMAL HIGH (ref 8–23)
CO2: 24 mmol/L (ref 22–32)
Calcium: 9.8 mg/dL (ref 8.9–10.3)
Chloride: 98 mmol/L (ref 98–111)
Creatinine, Ser: 1.09 mg/dL — ABNORMAL HIGH (ref 0.44–1.00)
GFR, Estimated: 46 mL/min — ABNORMAL LOW (ref >60)
Glucose, Bld: 128 mg/dL — ABNORMAL HIGH (ref 70–99)
Potassium: 3.7 mmol/L (ref 3.5–5.1)
Sodium: 133 mmol/L — ABNORMAL LOW (ref 135–145)

## 2022-12-09 LAB — CBC
HCT: 30 % — ABNORMAL LOW (ref 36.0–46.0)
Hemoglobin: 10.1 g/dL — ABNORMAL LOW (ref 12.0–15.0)
MCH: 32.1 pg (ref 26.0–34.0)
MCHC: 33.7 g/dL (ref 30.0–36.0)
MCV: 95.2 fL (ref 80.0–100.0)
Platelets: 361 10*3/uL (ref 150–400)
RBC: 3.15 MIL/uL — ABNORMAL LOW (ref 3.87–5.11)
RDW: 13.9 % (ref 11.5–15.5)
WBC: 18 10*3/uL — ABNORMAL HIGH (ref 4.0–10.5)
nRBC: 0 % (ref 0.0–0.2)

## 2022-12-09 LAB — RESP PANEL BY RT-PCR (RSV, FLU A&B, COVID)  RVPGX2
Influenza A by PCR: NEGATIVE
Influenza B by PCR: NEGATIVE
Resp Syncytial Virus by PCR: NEGATIVE
SARS Coronavirus 2 by RT PCR: NEGATIVE

## 2022-12-09 LAB — BRAIN NATRIURETIC PEPTIDE: B Natriuretic Peptide: 380.2 pg/mL — ABNORMAL HIGH (ref 0.0–100.0)

## 2022-12-09 LAB — LACTIC ACID, PLASMA: Lactic Acid, Venous: 0.8 mmol/L (ref 0.5–1.9)

## 2022-12-09 MED ORDER — SODIUM CHLORIDE 0.9 % IV SOLN
1.0000 g | Freq: Once | INTRAVENOUS | Status: AC
  Administered 2022-12-09: 1 g via INTRAVENOUS
  Filled 2022-12-09: qty 10

## 2022-12-09 MED ORDER — AZITHROMYCIN 250 MG PO TABS
500.0000 mg | ORAL_TABLET | Freq: Once | ORAL | Status: AC
  Administered 2022-12-09: 500 mg via ORAL
  Filled 2022-12-09: qty 2

## 2022-12-09 MED ORDER — ALBUTEROL SULFATE HFA 108 (90 BASE) MCG/ACT IN AERS
2.0000 | INHALATION_SPRAY | RESPIRATORY_TRACT | Status: DC | PRN
  Administered 2022-12-09: 2 via RESPIRATORY_TRACT
  Filled 2022-12-09: qty 6.7

## 2022-12-09 MED ORDER — LACTATED RINGERS IV BOLUS
500.0000 mL | Freq: Once | INTRAVENOUS | Status: AC
  Administered 2022-12-09: 500 mL via INTRAVENOUS

## 2022-12-09 NOTE — ED Provider Notes (Signed)
[facility] AT [facility] Provider Note   CSN: [id number] Arrival date & time: 12/09/22  1433     History  Chief Complaint  Patient presents with   Weakness    JADDA ESBENSHADE is a 89 y.o. female.   Weakness    Pt presents with complaints of cough, congestion, decreased appetite the past week.  Pt has not been eating or drinking much.  She has been coughing a lot, non productive.  She also has had a headache going to her shoulders.  No fevers.   No vomiting or diarrhea.  Some chest pain but that is intermittent and chronic and started before this past week.  Pt went to a doctors office today and was sent to the ED for further evaluation.  Borderline BP and oxygen saturation at the office.  Home Medications Prior to Admission medications   Medication Sig Start Date End Date Taking? Authorizing Provider  Diclofenac Sodium 3 % GEL Apply topically. 11/21/22  Yes [provider]  acetaminophen (TYLENOL) 500 MG tablet Take 500-1,000 mg by mouth every 6 (six) hours as needed (for pain or headaches).    [provider]  albuterol (PROVENTIL) (2.5 MG/3ML) 0.083% nebulizer solution Take 3 mLs (2.5 mg total) by nebulization every 6 (six) hours as needed for wheezing or shortness of breath. 09/05/21   Charlott Holler, MD  albuterol (VENTOLIN HFA) 108 (90 Base) MCG/ACT inhaler Inhale 2 puffs into the lungs every 6 (six) hours as needed for wheezing or shortness of breath.    [provider]  ALPRAZolam Prudy Feeler) 0.25 MG tablet Take 0.125 mg by mouth daily as needed for anxiety.    [provider]  amLODipine (NORVASC) 5 MG tablet Take 1 tablet (5 mg total) by mouth daily. Please keep scheduled appointment 10/15/21   Ronney Asters, NP  carvedilol (COREG) 3.125 MG tablet Take 3.125 mg by mouth 2 (two) times daily. 01/31/21   [provider]  CELEBREX 200 MG capsule Take 200 mg by mouth 2 (two) times daily. *once to twice daily as  needed for back pain*    [provider]  cholecalciferol (VITAMIN D) 1000 units tablet Take 2,000 Units by mouth daily.     [provider]  ketoconazole (NIZORAL) 2 % shampoo Apply topically 2 (two) times a week. 04/29/21   [provider]  levothyroxine (SYNTHROID) 75 MCG tablet Take 75 mcg by mouth every morning. 05/27/19   [provider]  Multiple Vitamins-Minerals (PRESERVISION AREDS 2+MULTI VIT) CAPS Take 1 capsule by mouth 2 (two) times daily.    [provider]  nitroGLYCERIN (NITROSTAT) 0.4 MG SL tablet as needed. 02/11/21   [provider]  pravastatin (PRAVACHOL) 20 MG tablet Take 20 mg by mouth at bedtime.    [provider]  sertraline (ZOLOFT) 100 MG tablet daily.    [provider]      Allergies    Patient has no known allergies.    Review of Systems   Review of Systems  Neurological:  Positive for weakness.    Physical Exam Updated Vital Signs BP (!) 111/48   Pulse 85   Temp (!) 97.4 F (36.3 C) (Oral)   Resp 20   Ht 1.626 m (5\' 4" )   Wt 62.9 kg   LMP  (LMP Unknown)   SpO2 92%   BMI 23.79 kg/m  Physical Exam Vitals and nursing note reviewed.  Constitutional:      Appearance:  She is well-developed. She is not diaphoretic.  HENT:     Head: Normocephalic and atraumatic.     Right Ear: External ear normal.     Left Ear: External ear normal.  Eyes:     General: No scleral icterus.       Right eye: No discharge.        Left eye: No discharge.     Conjunctiva/sclera: Conjunctivae normal.  Neck:     Trachea: No tracheal deviation.  Cardiovascular:     Rate and Rhythm: Normal rate and regular rhythm.  Pulmonary:     Effort: Pulmonary effort is normal. No respiratory distress.     Breath sounds: No stridor. Rales present. No wheezing.  Abdominal:     General: Bowel sounds are normal. There is no distension.     Palpations: Abdomen is soft.     Tenderness: There is no abdominal  tenderness. There is no guarding or rebound.  Musculoskeletal:        General: No tenderness or deformity.     Cervical back: Neck supple.  Skin:    General: Skin is warm and dry.     Findings: No rash.  Neurological:     General: No focal deficit present.     Mental Status: She is alert.     Cranial Nerves: No cranial nerve deficit, dysarthria or facial asymmetry.     Sensory: No sensory deficit.     Motor: No abnormal muscle tone or seizure activity.     Coordination: Coordination normal.  Psychiatric:        Mood and Affect: Mood normal.     ED Results / Procedures / Treatments   Labs (all labs ordered are listed, but only abnormal results are displayed) Labs Reviewed  CBC - Abnormal; Notable for the following components:      Result Value   WBC 18.0 (*)    RBC 3.15 (*)    Hemoglobin 10.1 (*)    HCT 30.0 (*)    All other components within normal limits  BASIC METABOLIC PANEL - Abnormal; Notable for the following components:   Sodium 133 (*)    Glucose, Bld 128 (*)    BUN 36 (*)    Creatinine, Ser 1.09 (*)    GFR, Estimated 46 (*)    All other components within normal limits  BRAIN NATRIURETIC PEPTIDE - Abnormal; Notable for the following components:   B Natriuretic Peptide 380.2 (*)    All other components within normal limits  RESP PANEL BY RT-PCR (RSV, FLU A&B, COVID)  RVPGX2  CULTURE, BLOOD (ROUTINE X 2)  CULTURE, BLOOD (ROUTINE X 2)  LACTIC ACID, PLASMA  LACTIC ACID, PLASMA    EKG EKG Interpretation Date/Time:  Tuesday December 09 2022 15:11:51 EST Ventricular Rate:  77 PR Interval:  167 QRS Duration:  99 QT Interval:  401 QTC Calculation: 454 R Axis:   76  Text Interpretation: Sinus rhythm Probable left atrial enlargement Borderline ST depression, lateral leads No significant change since last tracing Confirmed by Linwood Dibbles [phone]) on 12/09/2022 3:18:26 PM  Radiology DG Chest Port 1 View  Result Date: 12/09/2022 CLINICAL DATA:  Three day history  of productive cough, shortness of breath, decreased appetite EXAM: PORTABLE CHEST 1 VIEW COMPARISON:  Chest radiograph dated 01/31/2021, CT chest dated 07/22/2021 FINDINGS: Normal lung volumes. Increased bilateral interstitial and patchy opacities. Blunting of the bilateral costophrenic angles. No pneumothorax. The heart size and mediastinal contours are within normal limits. Partially imaged  right proximal humeral fixation hardware. IMPRESSION: 1. Increased bilateral interstitial and patchy opacities, which may represent pulmonary edema or multifocal pneumonia. 2. Blunting of the bilateral costophrenic angles, which may represent small pleural effusions. Electronically Signed   By: Agustin Cree M.D.   On: 12/09/2022 16:51    Procedures Procedures    Medications Ordered in ED Medications  albuterol (VENTOLIN HFA) 108 (90 Base) MCG/ACT inhaler 2 puff (2 puffs Inhalation Given 12/09/22 1628)  lactated ringers bolus 500 mL (0 mLs Intravenous Stopped 12/09/22 1830)  cefTRIAXone (ROCEPHIN) 1 g in sodium chloride 0.9 % 100 mL IVPB (0 g Intravenous Stopped 12/09/22 1830)  azithromycin (ZITHROMAX) tablet 500 mg (500 mg Oral Given 12/09/22 1753)    ED Course/ Medical Decision Making/ A&P Clinical Course as of 12/09/22 1835  Tue Dec 09, 2022  1617 Basic metabolic panel(!) Creatinine elevated compared to previous [JK]  1617 Brain natriuretic peptide(!) BNP increased at 380 [JK]  1617 COVID flu RSV negative [JK]  1636 CBC(!) Leukocytosis noted.  Hemoglobin decreased compared to previous [JK]  1658 DG Chest Port 1 View Chest x-ray shows infiltrates consistent with pulmonary edema or multifocal pneumonia.  Clinical presentation more suggestive of the latter [JK]  1835 Case discussed with Dr. Adela Glimpse regarding admission [JK]    Clinical Course User Index [JK] Linwood Dibbles, MD                                 Medical Decision Making Differential diagnosis includes but not limited to CHF, pneumonia,  pneumothorax  Problems Addressed: Community acquired pneumonia, unspecified laterality: acute illness or injury that poses a threat to life or bodily functions Dehydration: acute illness or injury that poses a threat to life or bodily functions  Amount and/or Complexity of Data Reviewed Labs: ordered. Decision-making details documented in ED Course. Radiology: ordered and independent interpretation performed. Decision-making details documented in ED Course.  Risk Prescription drug management. Decision regarding hospitalization.   Patient presented to the ED for evaluation of cough congestion decreased appetite.  Patient was at a doctor's office today noted to have low blood pressure and oxygen level.  She was sent to the ED for further evaluation.  In the ED the patient's oxygen saturation is in the low 90s on room air but this is at rest.  Initial blood pressure was 98 systolic but that has improved with IV fluid hydration.  Most recent blood pressure now 119/80.  Patient noted to have significant leukocytosis as well as increased BUN and creatinine and x-ray suggestive of multifocal pneumonia.  With her age comorbidities I have added on lactic acid level blood cultures.  IV antibiotics ordered.  Patient previously received IV fluids.  Will plan on transfer and admission to a hospital for inpatient treatment.        Final Clinical Impression(s) / ED Diagnoses Final diagnoses:  Community acquired pneumonia, unspecified laterality  Dehydration    Rx / DC Orders ED Discharge Orders     None         Linwood Dibbles, MD 12/09/22 [phone]

## 2022-12-09 NOTE — ED Notes (Signed)
Pt placed on BSC and back in bed on monitor. Pt SpO2 found to be 88% on RA. Pt placed on 2L [place] again, SpO2 went back up to 94%. Pt wore O2 for short time and is refusing now at this time to be placed back on [place]. Respiratory notified.

## 2022-12-09 NOTE — ED Notes (Addendum)
Pt assisted x2 to bedside commode. Notably SOB during ambulation. O2 sat decreased to 88% then increased to 93% room air at rest. RT at bedside to assess.

## 2022-12-09 NOTE — ED Notes (Signed)
Pt used BSC, upon being placed back on monitor once back in stretcher, SpO2 found to be 88% RA. Pt placed on 2L [place], SpO2 now 94-95%. Respiratory notified.

## 2022-12-09 NOTE — ED Notes (Signed)
Blood cultures x2 drawn before starting antibiotics.

## 2022-12-09 NOTE — ED Triage Notes (Signed)
Pt arrived POV with family, caox4 c/o productive cough, decreased appetite, and weakness x3 days. Pt was seen at PCP who recommended pt go to ED d/t to have low SpO2 and low BP.

## 2022-12-10 ENCOUNTER — Inpatient Hospital Stay (HOSPITAL_COMMUNITY): Payer: Medicare Other

## 2022-12-10 ENCOUNTER — Encounter (HOSPITAL_COMMUNITY): Payer: Self-pay | Admitting: Internal Medicine

## 2022-12-10 DIAGNOSIS — I509 Heart failure, unspecified: Secondary | ICD-10-CM

## 2022-12-10 DIAGNOSIS — J849 Interstitial pulmonary disease, unspecified: Secondary | ICD-10-CM

## 2022-12-10 DIAGNOSIS — E871 Hypo-osmolality and hyponatremia: Secondary | ICD-10-CM | POA: Diagnosis not present

## 2022-12-10 DIAGNOSIS — N179 Acute kidney failure, unspecified: Secondary | ICD-10-CM

## 2022-12-10 DIAGNOSIS — R54 Age-related physical debility: Secondary | ICD-10-CM

## 2022-12-10 DIAGNOSIS — J45909 Unspecified asthma, uncomplicated: Secondary | ICD-10-CM | POA: Insufficient documentation

## 2022-12-10 DIAGNOSIS — J189 Pneumonia, unspecified organism: Secondary | ICD-10-CM

## 2022-12-10 DIAGNOSIS — J4520 Mild intermittent asthma, uncomplicated: Secondary | ICD-10-CM

## 2022-12-10 DIAGNOSIS — F411 Generalized anxiety disorder: Secondary | ICD-10-CM | POA: Insufficient documentation

## 2022-12-10 DIAGNOSIS — I5032 Chronic diastolic (congestive) heart failure: Secondary | ICD-10-CM

## 2022-12-10 DIAGNOSIS — E039 Hypothyroidism, unspecified: Secondary | ICD-10-CM

## 2022-12-10 LAB — EXPECTORATED SPUTUM ASSESSMENT W GRAM STAIN, RFLX TO RESP C: Special Requests: NORMAL

## 2022-12-10 LAB — BLOOD GAS, ARTERIAL
Acid-base deficit: 1.9 mmol/L (ref 0.0–2.0)
Bicarbonate: 24.3 mmol/L (ref 20.0–28.0)
Drawn by: 51133
O2 Content: 5 L/min
O2 Saturation: 98.3 %
Patient temperature: 36.7
pCO2 arterial: 45 mm[Hg] (ref 32–48)
pH, Arterial: 7.33 — ABNORMAL LOW (ref 7.35–7.45)
pO2, Arterial: 82 mm[Hg] — ABNORMAL LOW (ref 83–108)

## 2022-12-10 LAB — GLUCOSE, CAPILLARY: Glucose-Capillary: 160 mg/dL — ABNORMAL HIGH (ref 70–99)

## 2022-12-10 LAB — COMPREHENSIVE METABOLIC PANEL
ALT: 32 U/L (ref 0–44)
AST: 28 U/L (ref 15–41)
Albumin: 2.6 g/dL — ABNORMAL LOW (ref 3.5–5.0)
Alkaline Phosphatase: 114 U/L (ref 38–126)
Anion gap: 12 (ref 5–15)
BUN: 33 mg/dL — ABNORMAL HIGH (ref 8–23)
CO2: 22 mmol/L (ref 22–32)
Calcium: 8.8 mg/dL — ABNORMAL LOW (ref 8.9–10.3)
Chloride: 99 mmol/L (ref 98–111)
Creatinine, Ser: 0.78 mg/dL (ref 0.44–1.00)
GFR, Estimated: >60 mL/min (ref >60)
Glucose, Bld: 178 mg/dL — ABNORMAL HIGH (ref 70–99)
Potassium: 3.4 mmol/L — ABNORMAL LOW (ref 3.5–5.1)
Sodium: 133 mmol/L — ABNORMAL LOW (ref 135–145)
Total Bilirubin: 1.2 mg/dL — ABNORMAL HIGH (ref <1.2)
Total Protein: 7.1 g/dL (ref 6.5–8.1)

## 2022-12-10 LAB — RESPIRATORY PANEL BY PCR

## 2022-12-10 LAB — CBC
HCT: 31.2 % — ABNORMAL LOW (ref 36.0–46.0)
Hemoglobin: 10.2 g/dL — ABNORMAL LOW (ref 12.0–15.0)
MCH: 32.5 pg (ref 26.0–34.0)
MCHC: 32.7 g/dL (ref 30.0–36.0)
MCV: 99.4 fL (ref 80.0–100.0)
Platelets: 377 10*3/uL (ref 150–400)
RBC: 3.14 MIL/uL — ABNORMAL LOW (ref 3.87–5.11)
RDW: 14.1 % (ref 11.5–15.5)
WBC: 21.1 10*3/uL — ABNORMAL HIGH (ref 4.0–10.5)
nRBC: 0 % (ref 0.0–0.2)

## 2022-12-10 LAB — STREP PNEUMONIAE URINARY ANTIGEN: Strep Pneumo Urinary Antigen: NEGATIVE

## 2022-12-10 LAB — PROCALCITONIN: Procalcitonin: 0.7 ng/mL

## 2022-12-10 MED ORDER — LEVOTHYROXINE SODIUM 50 MCG PO TABS
75.0000 ug | ORAL_TABLET | Freq: Every day | ORAL | Status: DC
  Administered 2022-12-11 – 2022-12-15 (×5): 75 ug via ORAL
  Filled 2022-12-10 (×5): qty 1

## 2022-12-10 MED ORDER — ENOXAPARIN SODIUM 30 MG/0.3ML IJ SOSY: 30.0000 mg | PREFILLED_SYRINGE | INTRAMUSCULAR | Status: DC

## 2022-12-10 MED ORDER — POTASSIUM CHLORIDE 10 MEQ/100ML IV SOLN
10.0000 meq | INTRAVENOUS | Status: AC
  Administered 2022-12-10 (×2): 10 meq via INTRAVENOUS
  Filled 2022-12-10 (×2): qty 100

## 2022-12-10 MED ORDER — LEVALBUTEROL HCL 0.63 MG/3ML IN NEBU
0.6300 mg | INHALATION_SOLUTION | Freq: Four times a day (QID) | RESPIRATORY_TRACT | Status: DC
  Administered 2022-12-10 – 2022-12-12 (×11): 0.63 mg via RESPIRATORY_TRACT
  Filled 2022-12-10 (×11): qty 3

## 2022-12-10 MED ORDER — SENNOSIDES-DOCUSATE SODIUM 8.6-50 MG PO TABS: 1.0000 | ORAL_TABLET | Freq: Every evening | ORAL | Status: DC | PRN

## 2022-12-10 MED ORDER — ONDANSETRON HCL 4 MG PO TABS: 4.0000 mg | ORAL_TABLET | Freq: Four times a day (QID) | ORAL | Status: DC | PRN

## 2022-12-10 MED ORDER — LEVALBUTEROL HCL 0.63 MG/3ML IN NEBU: 0.6300 mg | INHALATION_SOLUTION | Freq: Four times a day (QID) | RESPIRATORY_TRACT | Status: DC

## 2022-12-10 MED ORDER — SODIUM CHLORIDE 0.9% FLUSH
3.0000 mL | Freq: Two times a day (BID) | INTRAVENOUS | Status: DC
  Administered 2022-12-10 – 2022-12-13 (×5): 3 mL via INTRAVENOUS

## 2022-12-10 MED ORDER — SODIUM CHLORIDE 0.9% FLUSH: 3.0000 mL | INTRAVENOUS | Status: DC | PRN

## 2022-12-10 MED ORDER — ALPRAZOLAM 0.25 MG PO TABS: 0.1250 mg | ORAL_TABLET | Freq: Every day | ORAL | Status: DC | PRN

## 2022-12-10 MED ORDER — FUROSEMIDE 10 MG/ML IJ SOLN
20.0000 mg | INTRAMUSCULAR | Status: AC
  Filled 2022-12-10: qty 2

## 2022-12-10 MED ORDER — GUAIFENESIN ER 600 MG PO TB12
1200.0000 mg | ORAL_TABLET | Freq: Two times a day (BID) | ORAL | Status: DC
  Administered 2022-12-10 – 2022-12-15 (×10): 1200 mg via ORAL
  Filled 2022-12-10 (×10): qty 2

## 2022-12-10 MED ORDER — CARVEDILOL 3.125 MG PO TABS: 3.1250 mg | ORAL_TABLET | Freq: Two times a day (BID) | ORAL | Status: DC

## 2022-12-10 MED ORDER — ALBUTEROL SULFATE HFA 108 (90 BASE) MCG/ACT IN AERS: 2.0000 | INHALATION_SPRAY | RESPIRATORY_TRACT | Status: DC | PRN

## 2022-12-10 MED ORDER — ENOXAPARIN SODIUM 40 MG/0.4ML IJ SOSY
40.0000 mg | PREFILLED_SYRINGE | Freq: Every day | INTRAMUSCULAR | Status: DC
  Administered 2022-12-10 – 2022-12-15 (×6): 40 mg via SUBCUTANEOUS
  Filled 2022-12-10 (×6): qty 0.4

## 2022-12-10 MED ORDER — LORAZEPAM 2 MG/ML IJ SOLN
0.5000 mg | Freq: Once | INTRAMUSCULAR | Status: AC
  Administered 2022-12-10: 0.5 mg via INTRAVENOUS
  Filled 2022-12-10: qty 1

## 2022-12-10 MED ORDER — LACTATED RINGERS IV SOLN: INTRAVENOUS | Status: DC

## 2022-12-10 MED ORDER — ORAL CARE MOUTH RINSE: 15.0000 mL | OROMUCOSAL | Status: DC | PRN

## 2022-12-10 MED ORDER — LEVALBUTEROL HCL 0.63 MG/3ML IN NEBU
0.6300 mg | INHALATION_SOLUTION | Freq: Four times a day (QID) | RESPIRATORY_TRACT | Status: DC
  Administered 2022-12-10: 0.63 mg via RESPIRATORY_TRACT
  Filled 2022-12-10: qty 3

## 2022-12-10 MED ORDER — ACETAMINOPHEN 650 MG RE SUPP: 650.0000 mg | Freq: Four times a day (QID) | RECTAL | Status: DC | PRN

## 2022-12-10 MED ORDER — IPRATROPIUM BROMIDE 0.02 % IN SOLN
0.5000 mg | RESPIRATORY_TRACT | Status: DC | PRN
  Administered 2022-12-10: 0.5 mg via RESPIRATORY_TRACT
  Filled 2022-12-10 (×2): qty 2.5

## 2022-12-10 MED ORDER — DOXYCYCLINE HYCLATE 100 MG IV SOLR
100.0000 mg | Freq: Two times a day (BID) | INTRAVENOUS | Status: DC
  Administered 2022-12-10 – 2022-12-11 (×3): 100 mg via INTRAVENOUS
  Filled 2022-12-10 (×3): qty 100

## 2022-12-10 MED ORDER — ACETYLCYSTEINE 20 % IN SOLN
4.0000 mL | Freq: Four times a day (QID) | RESPIRATORY_TRACT | Status: DC
  Administered 2022-12-10 – 2022-12-12 (×10): 4 mL via RESPIRATORY_TRACT
  Filled 2022-12-10 (×14): qty 4

## 2022-12-10 MED ORDER — SODIUM CHLORIDE 0.9 % IV SOLN
1.0000 g | INTRAVENOUS | Status: DC
  Administered 2022-12-10: 1 g via INTRAVENOUS
  Filled 2022-12-10: qty 10

## 2022-12-10 MED ORDER — SERTRALINE HCL 100 MG PO TABS
100.0000 mg | ORAL_TABLET | Freq: Every day | ORAL | Status: DC
  Administered 2022-12-10 – 2022-12-15 (×6): 100 mg via ORAL
  Filled 2022-12-10 (×6): qty 1

## 2022-12-10 MED ORDER — ACETAMINOPHEN 325 MG PO TABS
650.0000 mg | ORAL_TABLET | Freq: Four times a day (QID) | ORAL | Status: DC | PRN
  Administered 2022-12-10 – 2022-12-13 (×2): 650 mg via ORAL
  Filled 2022-12-10 (×2): qty 2

## 2022-12-10 MED ORDER — IPRATROPIUM BROMIDE 0.02 % IN SOLN
0.5000 mg | Freq: Four times a day (QID) | RESPIRATORY_TRACT | Status: DC
  Administered 2022-12-10 – 2022-12-12 (×8): 0.5 mg via RESPIRATORY_TRACT
  Filled 2022-12-10 (×8): qty 2.5

## 2022-12-10 MED ORDER — SODIUM CHLORIDE 0.9 % IV SOLN: 250.0000 mL | INTRAVENOUS | Status: AC | PRN

## 2022-12-10 MED ORDER — GUAIFENESIN ER 600 MG PO TB12
600.0000 mg | ORAL_TABLET | Freq: Two times a day (BID) | ORAL | Status: DC
  Administered 2022-12-10 (×2): 600 mg via ORAL
  Filled 2022-12-10 (×2): qty 1

## 2022-12-10 MED ORDER — ONDANSETRON HCL 4 MG/2ML IJ SOLN: 4.0000 mg | Freq: Four times a day (QID) | INTRAMUSCULAR | Status: DC | PRN

## 2022-12-10 NOTE — Evaluation (Signed)
Occupational Therapy Evaluation Patient Details [name] MRN: [id number] DOB: 1921/11/16 Today's Date: 12/10/2022   History of Present Illness Pt is a 89 y.o. female presenting from PCP with low SpO2 and SBP, cough, and congestion on 12/09/2022. DG chest with worsending pneumonia superimposed on ILD.  PMH significant for grade 2 diastolic heart failure with preserved EF, near syncope, frequent PVC, bradycardia, HTN, hypothyroidism, recurrent UTI, DOE, nephrolithiasis, ILD.   Clinical Impression   PTA, pt lived at [facility] and her son reports she was mod I for mobility with RW, ADL, and medication management. Upon eval, pt with slow processing, decreased safety, awareness, balance, strength, and cardiopulmonary function. Pt needing up to min A (+2/safety) for ambulatory transfers and up to max A for LB ADL. Unclear whether staff can assist physically and cognitively where pt lives, thus recommending inpatient rehab <3 hours although believe pt would thrive best in natural context if support is available.       If plan is discharge home, recommend the following: A little help with walking and/or transfers;A lot of help with bathing/dressing/bathroom;Assistance with cooking/housework;Help with stairs or ramp for entrance;Assist for transportation;Direct supervision/assist for medications management    Functional Status Assessment  Patient has had a recent decline in their functional status and demonstrates the ability to make significant improvements in function in a reasonable and predictable amount of time.  Equipment Recommendations  Other (comment) (defer)    Recommendations for Other Services       Precautions / Restrictions Precautions Precautions: Fall Restrictions Weight Bearing Restrictions: No      Mobility Bed Mobility Overal bed mobility: Needs Assistance Bed Mobility: Supine to Sit, Sit to Supine     Supine to sit: Min assist Sit to supine:  Min assist   General bed mobility comments: cues for initiation; assist for truncal elevation OOB and for guidance/repositioning when returning to bed    Transfers Overall transfer level: Needs assistance Equipment used: Rolling walker (2 wheels) Transfers: Sit to/from Stand Sit to Stand: Min assist, +2 safety/equipment           General transfer comment: Min A with cues for hand placement and assist for rise and steady      Balance                                           ADL either performed or assessed with clinical judgement   ADL Overall ADL's : Needs assistance/impaired Eating/Feeding: Set up;Sitting Eating/Feeding Details (indicate cue type and reason): supported Grooming: Set up;Sitting   Upper Body Bathing: Contact guard assist;Sitting   Lower Body Bathing: Maximal assistance;Sit to/from stand   Upper Body Dressing : Sitting;Minimal assistance   Lower Body Dressing: Maximal assistance;Sit to/from stand Lower Body Dressing Details (indicate cue type and reason): donning new brief Toilet Transfer: Minimal assistance;+2 for safety/equipment;Rolling walker (2 wheels);Ambulation Toilet Transfer Details (indicate cue type and reason): assist for both balance and managing RW with cues for safety throughout. +2 present for lines/safety Toileting- Clothing Manipulation and Hygiene: Maximal assistance;Sit to/from stand Toileting - Clothing Manipulation Details (indicate cue type and reason): Pt needing significant assist to manage brief this date as well as initally to achieve grasp on toilet paper     Functional mobility during ADLs: Minimal assistance;Rolling walker (2 wheels);+2 for safety/equipment       Vision Patient Visual Report:  No change from baseline Additional Comments: WFL for BADL     Perception Perception: Not tested       Praxis Praxis: Not tested       Pertinent Vitals/Pain Pain Assessment Pain Assessment: Faces Faces  Pain Scale: Hurts whole lot Pain Location: R occiput/base of skull Pain Descriptors / Indicators: Aching, Sore, Grimacing, Crying Pain Intervention(s): Limited activity within patient's tolerance, Monitored during session, Patient requesting pain meds-RN notified     Extremity/Trunk Assessment Upper Extremity Assessment Upper Extremity Assessment: Generalized weakness   Lower Extremity Assessment Lower Extremity Assessment: Defer to PT evaluation   Cervical / Trunk Assessment Cervical / Trunk Assessment: Kyphotic   Communication Communication Communication: Hearing impairment Cueing Techniques: Verbal cues;Tactile cues;Gestural cues   Cognition Arousal: Alert Behavior During Therapy: WFL for tasks assessed/performed Overall Cognitive Status: Impaired/Different from baseline Area of Impairment: Memory, Following commands, Safety/judgement, Awareness, Problem solving, Attention                   Current Attention Level: Sustained Memory: Decreased recall of precautions Following Commands: Follows one step commands consistently, Follows one step commands with increased time (intermittently needing cues secondary to hard of hearing and poor problem solving) Safety/Judgement: Decreased awareness of deficits, Decreased awareness of safety Awareness: Emergent (recognizes which tasks are difficult. More difficulty anticipating difficulty and coming up with course of action) Problem Solving: Slow processing, Difficulty sequencing, Requires verbal cues, Requires tactile cues General Comments: Pt with slowed cognitive processing, likely appropriate given age. However, per son manages own medications and notable decreased problem solving and need for cues during mobility and hygiene. Unsure how much help or supervision pt actually receives at ALF, but would recommend oversight in home setting with executive function tasks.     General Comments  on 1.5-2 L during session with HR from  95-105 and SpO2 >93    Exercises     Shoulder Instructions      Home Living Family/patient expects to be discharged to:: Assisted living ([facility]) Living Arrangements: Spouse/significant other Available Help at Discharge: Other (Comment) (staff at ALF)                                    Prior Functioning/Environment Prior Level of Function : Independent/Modified Independent             Mobility Comments: RW; per son and husband typically able to ambulate to the dining hall at her ALF ADLs Comments: Per son and husband able to perform self care and manage her own medications. Meals and cleaning provided by facility        OT Problem List: Decreased strength;Decreased activity tolerance;Impaired balance (sitting and/or standing);Decreased cognition;Decreased safety awareness;Decreased knowledge of use of DME or AE;Cardiopulmonary status limiting activity;Pain      OT Treatment/Interventions: Self-care/ADL training;Neuromuscular education;DME and/or AE instruction;Balance training;Patient/family education;Therapeutic activities;Cognitive remediation/compensation    OT Goals(Current goals can be found in the care plan section) Acute Rehab OT Goals Patient Stated Goal: get better OT Goal Formulation: With patient Time For Goal Achievement: 12/24/22 Potential to Achieve Goals: Good  OT Frequency: Min 1X/week    Co-evaluation              AM-PAC OT "6 Clicks" Daily Activity     Outcome Measure Help from another person eating meals?: A Little Help from another person taking care of personal grooming?: A Little Help from another person toileting,  which includes using toliet, bedpan, or urinal?: A Lot Help from another person bathing (including washing, rinsing, drying)?: A Lot Help from another person to put on and taking off regular upper body clothing?: A Little Help from another person to put on and taking off regular lower body clothing?:  A Lot 6 Click Score: 15   End of Session Equipment Utilized During Treatment: Gait belt;Rolling walker (2 wheels);Oxygen (1.5L) Nurse Communication: Mobility status  Activity Tolerance: Patient tolerated treatment well Patient left: in bed;with call bell/phone within reach;with bed alarm set;with family/visitor present;with nursing/sitter in room  OT Visit Diagnosis: Unsteadiness on feet (R26.81);Muscle weakness (generalized) (M62.81);Other symptoms and signs involving cognitive function;Pain                Time: 6045-4098 OT Time Calculation (min): 42 min Charges:  OT General Charges $OT Visit: 1 Visit OT Evaluation $OT Eval Moderate Complexity: 1 Mod OT Treatments $Self Care/Home Management : 8-22 mins  Tyler Deis, OTR/L [facility] Acute Rehabilitation Office: [phone]   Myrla Halsted 12/10/2022, 3:43 PM

## 2022-12-10 NOTE — Evaluation (Signed)
Clinical/Bedside Swallow Evaluation Patient Details  [name] MRN: [id number] Date of Birth: Jan 22, 1921  Today's Date: 12/10/2022 Time: SLP Start Time (ACUTE ONLY): 1337 SLP Stop Time (ACUTE ONLY): 1353 SLP Time Calculation (min) (ACUTE ONLY): 16 min  Past Medical History:  Past Medical History:  Diagnosis Date   Anxiety    Aortic stenosis    a. mild by echo 2017.   Arthritis    Bradycardia    a. ? details not clear - in the past has had PVCs which have lowered effective HR count by pulse check.   Chronic diastolic CHF (congestive heart failure) (HCC)    Diastolic dysfunction    mild    Frequent PVCs    a. 48 Hour Holter 10/08/15 showed NSR and sinus tach with frequent PVCs, occasional bigeminy, total PVC burden 16.5%, lowest HR 59.   Hyperlipidemia    Hypertension    Hypertensive heart disease    Hypothyroidism    Kidney stones    Mild mitral regurgitation by prior echocardiogram    Tachycardia    history of tachycardia and bradycardia   Past Surgical History:  Past Surgical History:  Procedure Laterality Date   APPENDECTOMY     CHOLECYSTECTOMY     EXTRACORPOREAL SHOCK WAVE LITHOTRIPSY     EYE SURGERY Bilateral    cataract surgery with lens implant   ORIF HUMERUS FRACTURE Right 10/31/2013   Procedure: Open Reduction Internal Fixation Right Proximal Humerus Fracture;  Surgeon: Eldred Manges, MD;  Location: [facility] OR;  Service: Orthopedics;  Laterality: Right;   TONSILLECTOMY     TOTAL ABDOMINAL HYSTERECTOMY     HPI:  [name] is a 89 y.o. female with medical history significant of grade 2 diastolic heart failure with preserved EF 55-60 %, near syncope, frequent PVC and bradycardia, essential hypertension, hypothyroidism, recurrent UTI, chronic dyspnea on exertion, hypothyroidism, nephrolithiasis and interstitial lung disease presenting to [facility] complain of cough, congestion, decreased appetite for 1 week.  Per chart patient reported she has  been not eating drinking much. Found to have multifocal pna, concern for CHF exacerbation, acute kidney injury    Assessment / Plan / Recommendation  Clinical Impression  Pt and family state she has been coughing due to the pna but deny recent or remote coughing in relation to po consumption. Her dentition is intact and lips are dry and moistend with wet cloth and at end of eval pt applied prescription lip cream. Oromotor exam was within normal limits with slightly decreased lingual precision during lateralization. Swallows appeared timely and there was one mild cough throughout evaluation suspected due to prolonged/larger sip with straw. No signs with smaller sips. Oral manipulation of applesauce and mastication with solid graham cracker was efficient without residue noted across multiple trials. Aspiration risk appears low at this time and recommend she continue with regular texture, thin liquids, pills with water (if difficulty can give whole in puree). No further ST needed. SLP Visit Diagnosis: Dysphagia, unspecified (R13.10)    Aspiration Risk  Mild aspiration risk    Diet Recommendation Regular;Thin liquid    Liquid Administration via: Straw;Cup Medication Administration: Whole meds with liquid Supervision: Patient able to self feed Compensations: Slow rate;Small sips/bites Postural Changes: Seated upright at 90 degrees    Other  Recommendations Oral Care Recommendations: Oral care BID    Recommendations for follow up therapy are one component of a multi-disciplinary discharge planning process, led by the attending physician.  Recommendations may be updated  based on patient status, additional functional criteria and insurance authorization.  Follow up Recommendations No SLP follow up      Assistance Recommended at Discharge    Functional Status Assessment Patient has not had a recent decline in their functional status  Frequency and Duration            Prognosis         Swallow Study   General Date of Onset: 12/10/22 HPI: [name] is a 89 y.o. female with medical history significant of grade 2 diastolic heart failure with preserved EF 55-60 %, near syncope, frequent PVC and bradycardia, essential hypertension, hypothyroidism, recurrent UTI, chronic dyspnea on exertion, hypothyroidism, nephrolithiasis and interstitial lung disease presenting to [facility] complain of cough, congestion, decreased appetite for 1 week.  Per chart patient reported she has been not eating drinking much. Found to have multifocal pna, concern for CHF exacerbation, acute kidney injury Type of Study: Bedside Swallow Evaluation Previous Swallow Assessment:  (none) Diet Prior to this Study: Regular Temperature Spikes Noted: No Respiratory Status: Nasal cannula History of Recent Intubation: No Behavior/Cognition: Alert;Cooperative;Pleasant mood Oral Cavity Assessment: Dry;Other (comment) (lips) Oral Care Completed by SLP: No Oral Cavity - Dentition: Adequate natural dentition Vision: Functional for self-feeding Self-Feeding Abilities: Able to feed self;Other (Comment) (able to feed but son feeding her due to IV beeping each time she moves her arm) Patient Positioning: Upright in bed Baseline Vocal Quality: Normal    Oral/Motor/Sensory Function Overall Oral Motor/Sensory Function: Within functional limits   Ice Chips Ice chips: Not tested   Thin Liquid Thin Liquid: Within functional limits Presentation: Cup;Straw    Nectar Thick Nectar Thick Liquid: Not tested   Honey Thick Honey Thick Liquid: Not tested   Puree Puree: Within functional limits   Solid     Solid: Within functional limits      Royce Macadamia 12/10/2022,2:11 PM

## 2022-12-10 NOTE — Plan of Care (Signed)
  Problem: Coping: Goal: Level of anxiety will decrease Outcome: Progressing   Problem: Safety: Goal: Ability to remain free from injury will improve Outcome: Progressing   Problem: Skin Integrity: Goal: Risk for impaired skin integrity will decrease Outcome: Progressing   Problem: Respiratory: Goal: Ability to maintain adequate ventilation will improve Outcome: Progressing

## 2022-12-10 NOTE — Progress Notes (Signed)
Rapid Response Event Note   Reason for Call :  Pt having difficulty breathing, drop in O2 71%ra  Initial Focused Assessment:  Pt with difficulty breathing, audible breath sounds, congestion, change in mentation.    Interventions:  6L [place], Nasal sunctioning, breathing tx,   Plan of Care:  Cxr, labs, continuous pulse ox   Event Summary:  Increased oxygen with improvement in sats.    MD Notified: Janalyn Shy, MD, Daniels,NP Call Time: 1610 Arrival Time: 0250 End Time: 0400  Reynold Bowen, RN

## 2022-12-10 NOTE — TOC Initial Note (Addendum)
Transition of Care [facility]) - Initial/Assessment Note    Patient Details  [name] MRN: [id number] Date of Birth: [birth year]  [facility]) CM/SW Contact:    Larrie Kass, LCSW Phone Number: 12/10/2022, 10:22 AM  Clinical Narrative:                 Pt from [facility] . Pt has rec for PT/OT evaluation. TOC to follow  Expected Discharge Plan:  (TBD) Barriers to Discharge: Continued Medical Work up   Patient Goals and CMS Choice            Expected Discharge Plan and Services                                              Prior Living Arrangements/Services                       Activities of Daily Living   ADL Screening (condition at time of admission) Independently performs ADLs?: No Does the patient have a NEW difficulty with bathing/dressing/toileting/self-feeding that is expected to last >3 days?: No Does the patient have a NEW difficulty with getting in/out of bed, walking, or climbing stairs that is expected to last >3 days?: No Does the patient have a NEW difficulty with communication that is expected to last >3 days?: No Is the patient deaf or have difficulty hearing?: Yes Does the patient have difficulty seeing, even when wearing glasses/contacts?: No Does the patient have difficulty concentrating, remembering, or making decisions?: No  Permission Sought/Granted                  Emotional Assessment              Admission diagnosis:  Dehydration [E86.0] Pneumonia [J18.9] Community acquired pneumonia, unspecified laterality [J18.9] Patient Active Problem List   Diagnosis Date Noted   AKI (acute kidney injury) (HCC) 12/10/2022   Hyponatremia 12/10/2022   Reactive airway disease 12/10/2022   Interstitial lung disease (HCC) 12/10/2022   Acute CHF (congestive heart failure) (HCC) 12/10/2022   GAD (generalized anxiety disorder) 12/10/2022   Age-related physical debility 12/10/2022   Multifocal  pneumonia 12/09/2022   Other intervertebral disc degeneration, lumbar region 09/23/2022   No-show for appointment 04/22/2019   Frequent PVCs    Bradycardia    Hypertensive heart disease    Hyperlipidemia 02/02/2014   Fracture of humerus, proximal, right, closed 10/31/2013   Proximal humerus fracture 10/31/2013   UTI (urinary tract infection) 09/24/2013   Acute on chronic diastolic congestive heart failure (HCC) 09/24/2013   Abnormal EKG 09/24/2013   Acute on chronic diastolic heart failure (HCC) 09/24/2013   Infection of left eye 09/24/2013   Near syncope 09/24/2012   Hypothyroidism 10/30/2011   Chest pain 10/29/2011   Chronic diastolic CHF (congestive heart failure) (HCC) 07/05/2010   Nonspecific (abnormal) findings on radiological and other examination of body structure 11/12/2009   CT, CHEST, ABNORMAL 11/12/2009   DYSPNEA ON EXERTION 06/05/2009   PCP:  Shireen Quan, DO Pharmacy:   [facility]) ELECTRON[facility] - [place], [place] - [address] [place] [address] Phone: [phone] Fax: [phone]  [facility] [place], [place] - [address] [place] [address] Phone: [phone] Fax: [phone]  [id number] - [place], [place] -  [address] [place] [address] Phone: [phone] Fax: [phone]  [facility] [address] [place] [address] Phone: [phone] Fax: [phone]     Social Determinants of Health (SDOH) Social History: SDOH Screenings   Food Insecurity: No Food Insecurity (12/10/2022)  Housing: Low Risk  (12/10/2022)  Transportation Needs: No Transportation Needs (12/10/2022)  Utilities: Not At Risk (12/10/2022)  Tobacco Use: Low Risk  (12/10/2022)   SDOH Interventions:     Readmission Risk Interventions     No data to display

## 2022-12-10 NOTE — Evaluation (Signed)
Physical Therapy Evaluation Patient Details Name: IOANA VERON MRN: [id number] DOB: [birth year] Today's Date: 12/10/2022  History of Present Illness  Pt is a 89 y.o. female presenting from PCP with low SpO2 and SBP, cough, and congestion on 12/09/2022. DG chest with worsending pneumonia superimposed on ILD.  PMH significant for grade 2 diastolic heart failure with preserved EF, near syncope, frequent PVC, bradycardia, HTN, hypothyroidism, recurrent UTI, DOE, nephrolithiasis, ILD.  Clinical Impression   Pt admitted with above diagnosis.  Pt currently with functional limitations due to the deficits listed below (see PT Problem List). Pt unable to provide clear insight to PLOF, son present and indicated pt was residing at [facility] with spouse, pt amb with RW facility distances mod I, mod I for ADLs, self care and medication management. Pt in bed when therapist arrived and completing breathing treatment. Family indicated that pt was requesting to take a nap and would be beneficial to work with pt following breathing treatment. Pt in agreement. Pt required min A, increased time and cues for sequencing for supine to sit, cues for scooting to EOB, min A x 2 for safety and stability with sit to stand  from EOB and commode. Pt required min A x 2 for safety and stability with gait tasks 12 feet x 2 to and from the bathroom, pt exhibited difficulty with RW management, required encouragement due to poor activity tolerance and cues for pursed lip breathing. Pt on 2 L/min during therapy intervention and no supplemental O2 at baseline. Pt elected to return to bed and all needs in place, family present. Pt expressed pain and nurse aware. Pt will benefit from acute skilled PT to increase their independence and safety with mobility to allow discharge.         If plan is discharge home, recommend the following: A lot of help with walking and/or transfers;A lot of help with  bathing/dressing/bathroom;Assistance with cooking/housework;Assist for transportation;Help with stairs or ramp for entrance;Supervision due to cognitive status;Direct supervision/assist for financial management;Direct supervision/assist for medications management   Can travel by private vehicle        Equipment Recommendations None recommended by PT  Recommendations for Other Services       Functional Status Assessment Patient has had a recent decline in their functional status and demonstrates the ability to make significant improvements in function in a reasonable and predictable amount of time.     Precautions / Restrictions Precautions Precautions: Fall Restrictions Weight Bearing Restrictions: No      Mobility  Bed Mobility               General bed mobility comments: cues for initiation; assist for trunk with HOB elevated and increased time to scoot to EOB min A for LEs and repositioning once returning to bed    Transfers                   General transfer comment: Min A with cues for hand placement and assist power up and balance    Ambulation/Gait Ambulation/Gait assistance: +2 physical assistance, +2 safety/equipment, Min assist Gait Distance (Feet): 12 Feet Assistive device: Rolling walker (2 wheels) Gait Pattern/deviations: Step-to pattern, Shuffle, Trunk flexed, Narrow base of support Gait velocity: decreased     General Gait Details: encouragement for gait with frequent standing rest breaks, pt required cues for pursed lip breathing, encouragement, safety and direction wtih min A x 2 for balance and RW management.  Stairs  Wheelchair Mobility     Tilt Bed    Modified Rankin (Stroke Patients Only)       Balance Overall balance assessment: Needs assistance Sitting-balance support: Feet supported Sitting balance-Leahy Scale: Good     Standing balance support: Bilateral upper extremity supported, During functional  activity, Reliant on assistive device for balance Standing balance-Leahy Scale: Poor                               Pertinent Vitals/Pain      Home Living Family/patient expects to be discharged to:: Assisted living ([facility]) Living Arrangements: Spouse/significant other Available Help at Discharge: Other (Comment) (staff at ALF)                    Prior Function Prior Level of Function : Independent/Modified Independent             Mobility Comments: RW; per son and husband typically able to ambulate to the dining hall at her ALF ADLs Comments: Per son and husband able to perform self care and manage her own medications. Meals and cleaning provided by facility     Extremity/Trunk Assessment   Upper Extremity Assessment Upper Extremity Assessment: Generalized weakness    Lower Extremity Assessment Lower Extremity Assessment: Generalized weakness    Cervical / Trunk Assessment Cervical / Trunk Assessment: Kyphotic  Communication   Communication Communication: Hearing impairment (wears B hearing aids) Cueing Techniques: Verbal cues;Tactile cues;Gestural cues  Cognition                                                General Comments General comments (skin integrity, edema, etc.): on 2 L during session with HR from 95-105 and SpO2 >93    Exercises     Assessment/Plan    PT Assessment Patient needs continued PT services  PT Problem List Decreased activity tolerance;Decreased balance;Decreased mobility;Decreased coordination;Decreased knowledge of use of DME;Decreased safety awareness;Cardiopulmonary status limiting activity;Pain       PT Treatment Interventions DME instruction;Gait training;Functional mobility training;Therapeutic activities;Therapeutic exercise;Balance training;Neuromuscular re-education;Patient/family education;Cognitive remediation    PT Goals (Current goals can be found in the Care Plan  section)  Acute Rehab PT Goals Patient Stated Goal: to get energy back PT Goal Formulation: With patient Time For Goal Achievement: 12/24/22 Potential to Achieve Goals: Fair    Frequency Min 1X/week     Co-evaluation PT/OT/SLP Co-Evaluation/Treatment: Yes Reason for Co-Treatment: Complexity of the patient's impairments (multi-system involvement);Necessary to address cognition/behavior during functional activity;For patient/therapist safety PT goals addressed during session: Mobility/safety with mobility;Balance;Proper use of DME OT goals addressed during session: ADL's and self-care;Proper use of Adaptive equipment and DME       AM-PAC PT "6 Clicks" Mobility  Outcome Measure Help needed turning from your back to your side while in a flat bed without using bedrails?: A Little Help needed moving from lying on your back to sitting on the side of a flat bed without using bedrails?: A Little Help needed moving to and from a bed to a chair (including a wheelchair)?: A Little Help needed standing up from a chair using your arms (e.g., wheelchair or bedside chair)?: A Little Help needed to walk in hospital room?: A Little Help needed climbing 3-5 steps with a railing? : Total 6 Click Score: 16  End of Session Equipment Utilized During Treatment: Gait belt;Oxygen Activity Tolerance: Patient limited by fatigue;Patient limited by pain Patient left: in bed;with call bell/phone within reach;with nursing/sitter in room;with family/visitor present;with bed alarm set Nurse Communication: Mobility status PT Visit Diagnosis: Unsteadiness on feet (R26.81);Other abnormalities of gait and mobility (R26.89);Muscle weakness (generalized) (M62.81);Difficulty in walking, not elsewhere classified (R26.2);Pain Pain - Right/Left:  (head and neck)    Time: 9937-1696 PT Time Calculation (min) (ACUTE ONLY): 41 min   Charges:   PT Evaluation $PT Eval Low Complexity: 1 Low PT Treatments $Gait Training:  8-22 mins PT General Charges $$ ACUTE PT VISIT: 1 Visit         Johnny Bridge, PT Acute Rehab   Jacqualyn Posey 12/10/2022, 3:59 PM

## 2022-12-10 NOTE — Progress Notes (Addendum)
Pt NT suctioned x2 down left nare for copious amount of thick yellow secretions.  Pt tolerated well.  Pt hyperoxygenated on 15L nrb prior to and after suctioning.

## 2022-12-10 NOTE — Progress Notes (Signed)
PROGRESS NOTE    [name]  WUJ:[id number] DOB: [birth year] DOA: 12/09/2022 PCP: Shireen Quan, DO   Brief Narrative: 89 year old with past medical history significant for grade 2 diastolic heart failure, preserved ejection fraction, frequent PVC and bradycardia, hypertension, hypothyroidism and recurrent UTI, chronic dyspnea, hypothyroidism, nephrolithiasis, interstitial lung disease presents complaining of cough, congestion, decreased appetite for 1 week.  Evaluation in the ED she was noted to be tachypneic oxygen saturation 92 on room air, chest x-ray increase bilateral interstitial and patchy opacity which could represent pulmonary edema or multifocal pneumonia.  Patient was admitted for multifocal pneumonia,  the same night of admission developed acute hypoxic respiratory failure and was placed on 5 L of oxygen.     Assessment & Plan:   Principal Problem:   Multifocal pneumonia Active Problems:   Chronic diastolic CHF (congestive heart failure) (HCC)   AKI (acute kidney injury) (HCC)   Hyponatremia   Interstitial lung disease (HCC)   Hypothyroidism   Reactive airway disease   Acute CHF (congestive heart failure) (HCC)   GAD (generalized anxiety disorder)   Age-related physical debility   1-Acute Hypoxic Respiratory failure; in setting of acute Multifocal  PNA, also component aspiration PNA>  -Continue with nebulizer, Mucomyst, Xopenex.  -Continue with Guaifenesin.  -Continue with IV Ceftriaxone and Doxycycline.  -Discussed with Husband and son at bedside, patient would not want to be resuscitated or be place on Life support. DNR, DNI.  -she is down to 3 L oxygen. She is more alert this am.  -Respiratory panel pending, COVID RSV negative  Acute Metabolic Encephalopathy, in setting of PNA , hypoxia and or medications. Received a dose of ativan.  Avoid Ativan.    Chronic diastolic heart failure: Appears compensated.  Initially concern for acute exacerbation but presentation  is not more consistent with pneumonia.  Might need at some point some IV Lasix when blood pressure stabilizes  AKI: Prerenal getting IV fluids.  Hyponatremia: Monitor on fluids  Interstitial lung disease: Continue with nebulizer  Hypothyroidism: Resume Synthroid.   History of PVC and bradycardia: Holding Coreg in the setting of hypotension  Generalized anxiety disorder: resume Zoloft Voice Xanax or Ativan  Advanced age and debility : PT OT consulted Hypokalemia; replaced  Estimated body mass index is 25.01 kg/m as calculated from the following:   Height as of this encounter: 5\' 4"  (1.626 m).   Weight as of this encounter: 66.1 kg.   DVT prophylaxis: Lovenox Code Status: DNR, discussed with husband and son.  Family Communication: Disposition Plan:  Status is: Inpatient Remains inpatient appropriate because: management of PNA    Consultants:  None  Procedures:  None  Antimicrobials:  Ceftriaxone, azithro  Subjective: Initially sleepy, wake up open eyes, say few words, confuse.   Objective: Vitals:   12/10/22 0356 12/10/22 0500 12/10/22 0604 12/10/22 0743  BP:   (!) 107/45 (!) 110/58  Pulse:   92 95  Resp:    (!) 24  Temp:    98.4 F (36.9 C)  TempSrc:    Oral  SpO2: 95%   99%  Weight:  66.1 kg    Height:       No intake or output data in the 24 hours ending 12/10/22 0754 Filed Weights   12/09/22 1448 12/10/22 0500  Weight: 62.9 kg 66.1 kg    Examination:  General exam: Appears calm and comfortable  Respiratory system: BL ronchus, mild tachypnea.  Cardiovascular system: S1 & S2 heard, RRR. No JVD, murmurs, rubs,  gallops or clicks. No pedal edema. Gastrointestinal system: Abdomen is nondistended, soft and nontender. No organomegaly or masses felt. Normal bowel sounds heard. Central nervous system: sleepy, wake up, say few words     Data Reviewed: I have personally reviewed following labs and imaging studies  CBC: Recent Labs  Lab  12/09/22 1521 12/10/22 0424  WBC 18.0* 21.1*  HGB 10.1* 10.2*  HCT 30.0* 31.2*  MCV 95.2 99.4  PLT 361 377   Basic Metabolic Panel: Recent Labs  Lab 12/09/22 1521 12/10/22 0424  NA 133* 133*  K 3.7 3.4*  CL 98 99  CO2 24 22  GLUCOSE 128* 178*  BUN 36* 33*  CREATININE 1.09* 0.78  CALCIUM 9.8 8.8*   GFR: Estimated Creatinine Clearance: 35.9 mL/min (by C-G formula based on SCr of 0.78 mg/dL). Liver Function Tests: Recent Labs  Lab 12/10/22 0424  AST 28  ALT 32  ALKPHOS 114  BILITOT 1.2*  PROT 7.1  ALBUMIN 2.6*   No results for input(s): "LIPASE", "AMYLASE" in the last 168 hours. No results for input(s): "AMMONIA" in the last 168 hours. Coagulation Profile: No results for input(s): "INR", "PROTIME" in the last 168 hours. Cardiac Enzymes: No results for input(s): "CKTOTAL", "CKMB", "CKMBINDEX", "TROPONINI" in the last 168 hours. BNP (last 3 results) No results for input(s): "PROBNP" in the last 8760 hours. HbA1C: No results for input(s): "HGBA1C" in the last 72 hours. CBG: Recent Labs  Lab 12/10/22 0251  GLUCAP 160*   Lipid Profile: No results for input(s): "CHOL", "HDL", "LDLCALC", "TRIG", "CHOLHDL", "LDLDIRECT" in the last 72 hours. Thyroid Function Tests: No results for input(s): "TSH", "T4TOTAL", "FREET4", "T3FREE", "THYROIDAB" in the last 72 hours. Anemia Panel: No results for input(s): "VITAMINB12", "FOLATE", "FERRITIN", "TIBC", "IRON", "RETICCTPCT" in the last 72 hours. Sepsis Labs: Recent Labs  Lab 12/09/22 1738 12/10/22 0424  PROCALCITON  --  0.70  LATICACIDVEN 0.8  --     Recent Results (from the past 240 hour(s))  Resp panel by RT-PCR (RSV, Flu A&B, Covid) Anterior Nasal Swab     Status: None   Collection Time: 12/09/22  2:53 PM   Specimen: Anterior Nasal Swab  Result Value Ref Range Status   SARS Coronavirus 2 by RT PCR NEGATIVE NEGATIVE Final    Comment: (NOTE) SARS-CoV-2 target nucleic acids are NOT DETECTED.  The SARS-CoV-2 RNA is  generally detectable in upper respiratory specimens during the acute phase of infection. The lowest concentration of SARS-CoV-2 viral copies this assay can detect is 138 copies/mL. A negative result does not preclude SARS-Cov-2 infection and should not be used as the sole basis for treatment or other patient management decisions. A negative result may occur with  improper specimen collection/handling, submission of specimen other than nasopharyngeal swab, presence of viral mutation(s) within the areas targeted by this assay, and inadequate number of viral copies(<138 copies/mL). A negative result must be combined with clinical observations, patient history, and epidemiological information. The expected result is Negative.  Fact Sheet for Patients:  BloggerCourse.com  Fact Sheet for Healthcare Providers:  SeriousBroker.it  This test is no t yet approved or cleared by the [place] FDA and  has been authorized for detection and/or diagnosis of SARS-CoV-2 by FDA under an Emergency Use Authorization (EUA). This EUA will remain  in effect (meaning this test can be used) for the duration of the COVID-19 declaration under Section 564(b)(1) of the Act, 21 U.S.C.section 360bbb-3(b)(1), unless the authorization is terminated  or revoked sooner.  Influenza A by PCR NEGATIVE NEGATIVE Final   Influenza B by PCR NEGATIVE NEGATIVE Final    Comment: (NOTE) The Xpert Xpress SARS-CoV-2/FLU/RSV plus assay is intended as an aid in the diagnosis of influenza from Nasopharyngeal swab specimens and should not be used as a sole basis for treatment. Nasal washings and aspirates are unacceptable for Xpert Xpress SARS-CoV-2/FLU/RSV testing.  Fact Sheet for Patients: BloggerCourse.com  Fact Sheet for Healthcare Providers: SeriousBroker.it  This test is not yet approved or cleared by the [place] FDA and has been authorized for detection and/or diagnosis of SARS-CoV-2 by FDA under an Emergency Use Authorization (EUA). This EUA will remain in effect (meaning this test can be used) for the duration of the COVID-19 declaration under Section 564(b)(1) of the Act, 21 U.S.C. section 360bbb-3(b)(1), unless the authorization is terminated or revoked.     Resp Syncytial Virus by PCR NEGATIVE NEGATIVE Final    Comment: (NOTE) Fact Sheet for Patients: BloggerCourse.com  Fact Sheet for Healthcare Providers: SeriousBroker.it  This test is not yet approved or cleared by the [place] FDA and has been authorized for detection and/or diagnosis of SARS-CoV-2 by FDA under an Emergency Use Authorization (EUA). This EUA will remain in effect (meaning this test can be used) for the duration of the COVID-19 declaration under Section 564(b)(1) of the Act, 21 U.S.C. section 360bbb-3(b)(1), unless the authorization is terminated or revoked.  Performed at [facility], [address], [place], [place] [address]   Blood culture (routine x 2)     Status: None (Preliminary result)   Collection Time: 12/09/22  5:38 PM   Specimen: BLOOD LEFT FOREARM  Result Value Ref Range Status   Specimen Description   Final    BLOOD LEFT FOREARM Performed at [facility], [address] [place], [place] [address]    Special Requests   Final    BOTTLES DRAWN AEROBIC AND ANAEROBIC Blood Culture adequate volume Performed at [facility], [address], [place], [place] [address]    Culture   Final    NO GROWTH < 12 HOURS Performed at [facility], [address] [place], [place] [address]    Report Status PENDING  Incomplete  Blood culture (routine x 2)     Status: None (Preliminary result)   Collection Time: 12/09/22  5:39 PM   Specimen: BLOOD RIGHT FOREARM  Result Value Ref Range Status    Specimen Description   Final    BLOOD RIGHT FOREARM Performed at [facility], [address], [place], [place] [address]    Special Requests   Final    BOTTLES DRAWN AEROBIC AND ANAEROBIC Blood Culture adequate volume Performed at [facility], [address], [place], [place] [address]    Culture   Final    NO GROWTH < 12 HOURS Performed at [facility], [address], [place], [place] [address]    Report Status PENDING  Incomplete         Radiology Studies: DG CHEST PORT 1 VIEW  Result Date: 12/10/2022 CLINICAL DATA:  Respiratory compromise. Increased shortness of breath and hypoxia EXAM: PORTABLE CHEST 1 VIEW COMPARISON:  12/09/2022 FINDINGS: Chronic lung disease with diffuse interstitial reticulation. Progression of patchy bilateral airspace disease especially at the right apex. No effusion or pneumothorax. Stable heart size and mediastinal contours. IMPRESSION: Worsening pneumonia superimposed on interstitial lung disease. Electronically Signed   By: Tiburcio Pea M.D.   On:  12/10/2022 04:29   DG Chest Port 1 View  Result Date: 12/09/2022 CLINICAL DATA:  Three day history of productive cough, shortness of breath, decreased appetite EXAM: PORTABLE CHEST 1 VIEW COMPARISON:  Chest radiograph dated 01/31/2021, CT chest dated 07/22/2021 FINDINGS: Normal lung volumes. Increased bilateral interstitial and patchy opacities. Blunting of the bilateral costophrenic angles. No pneumothorax. The heart size and mediastinal contours are within normal limits. Partially imaged right proximal humeral fixation hardware. IMPRESSION: 1. Increased bilateral interstitial and patchy opacities, which may represent pulmonary edema or multifocal pneumonia. 2. Blunting of the bilateral costophrenic angles, which may represent small pleural effusions. Electronically Signed   By: Agustin Cree M.D.   On: 12/09/2022 16:51        Scheduled Meds:  acetylcysteine  4 mL  Nebulization Q6H   enoxaparin (LOVENOX) injection  40 mg Subcutaneous Daily   furosemide  20 mg Intravenous STAT   guaiFENesin  600 mg Oral BID   levalbuterol  0.63 mg Nebulization Q6H   sertraline  100 mg Oral Daily   sodium chloride flush  3 mL Intravenous Q12H   Continuous Infusions:  sodium chloride     cefTRIAXone (ROCEPHIN)  IV     doxycycline (VIBRAMYCIN) IV 100 mg (12/10/22 0119)   lactated ringers 50 mL/hr at 12/10/22 0117   potassium chloride       LOS: 1 day    Time spent: 35 minutes    Cerina Leary A Yexalen Deike, MD [facility]   If 7PM-7AM, please contact night-coverage [email or web address]  12/10/2022, 7:54 AM

## 2022-12-10 NOTE — H&P (Addendum)
History and Physical    [name] ZOX:[id number] DOB: 1921/05/05 DOA: 12/09/2022  PCP: Shireen Quan, DO   Patient coming from: Home   Chief Complaint:  Chief Complaint  Patient presents with   Weakness   ED TRIAGE note:Pt arrived POV with family, caox4 c/o productive cough, decreased appetite, and weakness x3 days. Pt was seen at PCP who recommended pt go to ED d/t to have low SpO2 and low BP.   HPI:  [name] is a 89 y.o. female with medical history significant of grade 2 diastolic heart failure with preserved EF 55-60 %, near syncope, frequent PVC and bradycardia, essential hypertension, hypothyroidism, recurrent UTI, chronic dyspnea on exertion, hypothyroidism, nephrolithiasis and interstitial lung disease presenting to [facility] complain of cough, congestion, decreased appetite for 1 week.  Patient reported she has been not eating drinking much.  She has been coughing a lot but it is nonproductive.  She also having headache that goes to her shoulder.  Denies any fever, chill, chest pain, nausea and vomiting.  Reported chest pain but that is intermittent and chronic and started before past week.  Patient reported she went to see her primary care doctor and sent over emergency department for evaluation.  During my evaluation at the bedside patient is alert and oriented x 3.  She is very anxious and asking for to start anxiety medication.  Reported she takes in the morning and 1 in the night.  Unable to get any other history from the patient.  History gathered from chart review, nursing note review and ED physician note reviewed.  Unable to reach patient's family member over phone, tried 3 different phone number which has been provided on the chart.  ED Course:  Presentation to ED patient found hypotensive blood pressure 98/41, tachypneic 22, O2 sat 92% room air.  Blood pressure has been improved to 127/57, heart rate is 81, O2 sat 93% room air now. Respiratory  panel negative. CBC showing leukocytosis 18, hemoglobin 10.1, hematocrit 33 and platelet count 361. BMP showing low sodium 133, elevated creatinine 1.09, elevated BUN 36 otherwise unremarkable. Elevated BNP 380.  It was 89 1 year ago. Normal lactic acid 0.8.  Blood cultures been obtained in the ED pending result. Chest x-ray showed: IMPRESSION: 1. Increased bilateral interstitial and patchy opacities, which may represent pulmonary edema or multifocal pneumonia. 2. Blunting of the bilateral costophrenic angles, which may represent small pleural effusions.  In the ED patient has been treated with azithromycin, ceftriaxone and 500 mL of LR bolus.  [facility] management of pneumonia.  Of note, pending home medication reconciliation by pharmacy..  Significant labs in the ED: Lab Orders         Resp panel by RT-PCR (RSV, Flu A&B, Covid) Anterior Nasal Swab         Blood culture (routine x 2)         Expectorated Sputum Assessment w Gram Stain, Rflx to Resp Cult         Respiratory (~20 pathogens) panel by PCR         CBC         Basic metabolic panel         Brain natriuretic peptide         Legionella Pneumophila Serogp 1 Ur Ag         Strep pneumoniae urinary antigen         Comprehensive metabolic panel  CBC         Procalcitonin       Review of Systems:  Review of Systems  Unable to perform ROS: Age    Past Medical History:  Diagnosis Date   Anxiety    Aortic stenosis    a. mild by echo 2017.   Arthritis    Bradycardia    a. ? details not clear - in the past has had PVCs which have lowered effective HR count by pulse check.   Chronic diastolic CHF (congestive heart failure) (HCC)    Diastolic dysfunction    mild    Frequent PVCs    a. 48 Hour Holter 10/08/15 showed NSR and sinus tach with frequent PVCs, occasional bigeminy, total PVC burden 16.5%, lowest HR 59.   Hyperlipidemia    Hypertension     Hypertensive heart disease    Hypothyroidism    Kidney stones    Mild mitral regurgitation by prior echocardiogram    Tachycardia    history of tachycardia and bradycardia    Past Surgical History:  Procedure Laterality Date   APPENDECTOMY     CHOLECYSTECTOMY     EXTRACORPOREAL SHOCK WAVE LITHOTRIPSY     EYE SURGERY Bilateral    cataract surgery with lens implant   ORIF HUMERUS FRACTURE Right 10/31/2013   Procedure: Open Reduction Internal Fixation Right Proximal Humerus Fracture;  Surgeon: Eldred Manges, MD;  Location: [facility] OR;  Service: Orthopedics;  Laterality: Right;   TONSILLECTOMY     TOTAL ABDOMINAL HYSTERECTOMY       reports that she has never smoked. She has never used smokeless tobacco. She reports that she does not drink alcohol and does not use drugs.  No Known Allergies  Family History  Problem Relation Age of Onset   Emphysema Father    Heart failure Mother    Heart attack Brother    Cancer Brother        kidney   Stroke Brother     Prior to Admission medications   Medication Sig Start Date End Date Taking? Authorizing Provider  Diclofenac Sodium 3 % GEL Apply topically. 11/21/22  Yes [provider]  acetaminophen (TYLENOL) 500 MG tablet Take 500-1,000 mg by mouth every 6 (six) hours as needed (for pain or headaches).    [provider]  albuterol (PROVENTIL) (2.5 MG/3ML) 0.083% nebulizer solution Take 3 mLs (2.5 mg total) by nebulization every 6 (six) hours as needed for wheezing or shortness of breath. 09/05/21   Charlott Holler, MD  albuterol (VENTOLIN HFA) 108 (90 Base) MCG/ACT inhaler Inhale 2 puffs into the lungs every 6 (six) hours as needed for wheezing or shortness of breath.    [provider]  ALPRAZolam Prudy Feeler) 0.25 MG tablet Take 0.125 mg by mouth daily as needed for anxiety.    [provider]  amLODipine (NORVASC) 5 MG tablet Take 1 tablet (5 mg total) by mouth daily. Please keep scheduled appointment 10/15/21    Ronney Asters, NP  carvedilol (COREG) 3.125 MG tablet Take 3.125 mg by mouth 2 (two) times daily. 01/31/21   [provider]  CELEBREX 200 MG capsule Take 200 mg by mouth 2 (two) times daily. *once to twice daily as needed for back pain*    [provider]  cholecalciferol (VITAMIN D) 1000 units tablet Take 2,000 Units by mouth daily.     [provider]  ketoconazole (NIZORAL) 2 % shampoo Apply topically 2 (two) times a week. 04/29/21  [provider]  levothyroxine (SYNTHROID) 75 MCG tablet Take 75 mcg by mouth every morning. 05/27/19   [provider]  Multiple Vitamins-Minerals (PRESERVISION AREDS 2+MULTI VIT) CAPS Take 1 capsule by mouth 2 (two) times daily.    [provider]  nitroGLYCERIN (NITROSTAT) 0.4 MG SL tablet as needed. 02/11/21   [provider]  pravastatin (PRAVACHOL) 20 MG tablet Take 20 mg by mouth at bedtime.    [provider]  sertraline (ZOLOFT) 100 MG tablet daily.    [provider]     Physical Exam: Vitals:   12/09/22 1900 12/09/22 1950 12/09/22 2217 12/09/22 2343  BP: 127/65  134/64 (!) 127/59  Pulse:   99 99  Resp: (!) 27  (!) 24 20  Temp:  (!) 97.5 F (36.4 C)  98.1 F (36.7 C)  TempSrc:  Oral  Oral  SpO2:   91% 93%  Weight:      Height:        Physical Exam Constitutional:      General: She is not in acute distress.    Appearance: She is not ill-appearing.  HENT:     Nose: Nose normal.     Mouth/Throat:     Mouth: Mucous membranes are moist.  Eyes:     Pupils: Pupils are equal, round, and reactive to light.  Cardiovascular:     Rate and Rhythm: Normal rate and regular rhythm.     Pulses: Normal pulses.     Heart sounds: Normal heart sounds.  Pulmonary:     Effort: Pulmonary effort is normal.     Breath sounds: Normal breath sounds.  Abdominal:     General: Bowel sounds are normal.  Musculoskeletal:     Cervical back: Neck supple.     Right lower leg: No  edema.     Left lower leg: No edema.  Skin:    Capillary Refill: Capillary refill takes less than 2 seconds.  Neurological:     Mental Status: She is alert and oriented to person, place, and time.  Psychiatric:        Behavior: Behavior normal.        Thought Content: Thought content normal.     Comments: Anxious mood.      Labs on Admission: I have personally reviewed following labs and imaging studies  CBC: Recent Labs  Lab 12/09/22 1521  WBC 18.0*  HGB 10.1*  HCT 30.0*  MCV 95.2  PLT 361   Basic Metabolic Panel: Recent Labs  Lab 12/09/22 1521  NA 133*  K 3.7  CL 98  CO2 24  GLUCOSE 128*  BUN 36*  CREATININE 1.09*  CALCIUM 9.8   GFR: Estimated Creatinine Clearance: 24.3 mL/min (A) (by C-G formula based on SCr of 1.09 mg/dL (H)). Liver Function Tests: No results for input(s): "AST", "ALT", "ALKPHOS", "BILITOT", "PROT", "ALBUMIN" in the last 168 hours. No results for input(s): "LIPASE", "AMYLASE" in the last 168 hours. No results for input(s): "AMMONIA" in the last 168 hours. Coagulation Profile: No results for input(s): "INR", "PROTIME" in the last 168 hours. Cardiac Enzymes: No results for input(s): "CKTOTAL", "CKMB", "CKMBINDEX", "TROPONINI", "TROPONINIHS" in the last 168 hours. BNP (last 3 results) Recent Labs    12/09/22 1521  BNP 380.2*   HbA1C: No results for input(s): "HGBA1C" in the last 72 hours. CBG: No results for input(s): "GLUCAP" in the last 168 hours. Lipid Profile: No results for input(s): "CHOL", "HDL", "LDLCALC", "TRIG", "CHOLHDL", "LDLDIRECT" in the last 72  hours. Thyroid Function Tests: No results for input(s): "TSH", "T4TOTAL", "FREET4", "T3FREE", "THYROIDAB" in the last 72 hours. Anemia Panel: No results for input(s): "VITAMINB12", "FOLATE", "FERRITIN", "TIBC", "IRON", "RETICCTPCT" in the last 72 hours. Urine analysis:    Component Value Date/Time   COLORURINE YELLOW 02/20/2018 1712   APPEARANCEUR HAZY (A) 02/20/2018 1712    LABSPEC 1.017 02/20/2018 1712   PHURINE 5.0 02/20/2018 1712   GLUCOSEU NEGATIVE 02/20/2018 1712   HGBUR NEGATIVE 02/20/2018 1712   BILIRUBINUR NEGATIVE 02/20/2018 1712   KETONESUR 5 (A) 02/20/2018 1712   PROTEINUR NEGATIVE 02/20/2018 1712   UROBILINOGEN 0.2 09/24/2013 1315   NITRITE NEGATIVE 02/20/2018 1712   LEUKOCYTESUR MODERATE (A) 02/20/2018 1712    Radiological Exams on Admission: I have personally reviewed images DG Chest Port 1 View  Result Date: 12/09/2022 CLINICAL DATA:  Three day history of productive cough, shortness of breath, decreased appetite EXAM: PORTABLE CHEST 1 VIEW COMPARISON:  Chest radiograph dated 01/31/2021, CT chest dated 07/22/2021 FINDINGS: Normal lung volumes. Increased bilateral interstitial and patchy opacities. Blunting of the bilateral costophrenic angles. No pneumothorax. The heart size and mediastinal contours are within normal limits. Partially imaged right proximal humeral fixation hardware. IMPRESSION: 1. Increased bilateral interstitial and patchy opacities, which may represent pulmonary edema or multifocal pneumonia. 2. Blunting of the bilateral costophrenic angles, which may represent small pleural effusions. Electronically Signed   By: Agustin Cree M.D.   On: 12/09/2022 16:51    EKG: My personal interpretation of EKG shows: EKG shows sinus rhythm heart rate 77 and evidence of left atrial enlargement.    Assessment/Plan: Principal Problem:   Multifocal pneumonia Active Problems:   Chronic diastolic CHF (congestive heart failure) (HCC)   AKI (acute kidney injury) (HCC)   Hyponatremia   Interstitial lung disease (HCC)   Hypothyroidism   Reactive airway disease   Acute CHF (congestive heart failure) (HCC)   GAD (generalized anxiety disorder)   Age-related physical debility    Assessment and Plan: Multifocal pneumonia -Patient presenting with in the emergency department complaining of cough, congestion, or appetite and not eating and drinking  well for last few days. - Appears patient to ED hypotensive 90/41, O2 sat 90% room air which improved to 92%.  Leukocytosis 18.  Normal lactic acid level - Checks x-ray showed Increased bilateral interstitial and patchy opacities, which may represent pulmonary edema or multifocal pneumonia. -Respiratory panel negative for flu, COVID and RSV.  Blood cultures obtained in the ED before initiating antibiotic. -Checking 20 respiratory panel. - In the ED patient got ceftriaxone and azithromycin. - Plan to continue IV ceftriaxone 1 g and IV doxycycline 100 mg twice daily. -Continue Mucinex twice daily, albuterol as needed for wheezing shortness of breath. -Pending respiratory panel, sputum culture, blood culture, urine Legionella and urine strep antigen panel. - Checking Pro-Cal level. Continue to check pulse ox and supplemental oxygen to keep O2 sat of 92%. -Continue cardiac monitoring.  Concern for CHF exacerbation Acute diastolic heart failure exacerbation > Patient presenting with cough, shortness of breath on exertion. Will exam patient is euvolemic. - Patient found to elevated BNP 374 and chest x-ray showing pulmonary edema and small pleural effusion. -Concern for CHF exacerbation however on presentation patient was hypertensive 9/41 and blood pressure has been improved to 127/59 100 mL of LR bolus in the ED. -Blood pressure is borderline low 111/48.  Unable to start Lasix IV in setting of hypotension and concern for deterioration. - If blood pressure allow will start treating with IV  diuretics. - Obtain echocardiogram. -Per chart review outpatient cardiology note patient is on Coreg 3.125 mg twice daily.  Waiting for pharmacy to verify other medications.  Holding oral blood pressure regimen in the setting of hypotension. -Continue cardiac monitoring   Acute kidney injury -Creatinine 1.09 on presentation, baseline GFR above 60. -Prerenal acute kidney injury in the setting of  hypotension. -Continue maintenance fluid LR 50 cc/h for 1 day. - Continue to monitor renal function and urine output.  Hyponatremia -Low sodium 133.  Concern for hyponatremia in the setting of CHF exacerbation - Currently unable to treat with IV diuretics in the setting of hypotension.  Continue to monitor serum sodium level for now  Interstitial lung disease -Continue albuterol nebulizer as needed. - Continue to check pulse ox, continue [place] oxygen to keep O2 sat above 94%.  Hypothyroidism -Waiting for pharmacy medication reconciliation and verification  History of PVC and bradycardia - EKG showing sinus rhythm heart rate 77.  Holding Coreg in the setting of hypotension.  Generalized anxiety disorder - Patient requesting for anxiety medication. -Resume Zoloft.  Holding Xanax as unsure if she takes Xanax or not.  Also there is high risk of fall with Xanax as well.  Advanced age and age due to debility - Consulted PT and OT for evaluation of balance -Continue fall precaution   DVT prophylaxis:  Lovenox Code Status:  Full Code.  Unable to verify with family over phone.  Patient will be Full code by default. Diet: Heart healthy diet with fluid restriction 2 L/day salt restriction 2 g/day Family Communication: There are 2 phone number of patients son on the chart.  Unable to reach over phone Disposition Plan: Tentative discharge to home next 2 to 3 days. Consults: PT and OT Admission status:   Inpatient, Telemetry bed  Severity of Illness: The appropriate patient status for this patient is INPATIENT. Inpatient status is judged to be reasonable and necessary in order to provide the required intensity of service to ensure the patient's safety. The patient's presenting symptoms, physical exam findings, and initial radiographic and laboratory data in the context of their chronic comorbidities is felt to place them at high risk for further clinical deterioration. Furthermore, it is not  anticipated that the patient will be medically stable for discharge from the hospital within 2 midnights of admission.   * I certify that at the point of admission it is my clinical judgment that the patient will require inpatient hospital care spanning beyond 2 midnights from the point of admission due to high intensity of service, high risk for further deterioration and high frequency of surveillance required.[name]    Tereasa Coop, MD [facility]  How to contact the [facility] Attending or Consulting provider 7A - 7P or covering provider during after hours 7P -7A, for this patient.  Check the care team in [facility] and look for a) attending/consulting [facility] provider listed and b) the [facility] team listed Log into [email or web address] and use [place]'s universal password to access. If you do not have the password, please contact the hospital operator. Locate the [facility] provider you are looking for under [facility] and page to a number that you can be directly reached. If you still have difficulty reaching the provider, please page the [facility] (Director on Call) for the Hospitalists listed on amion for assistance.  12/10/2022, 1:18 AM

## 2022-12-10 NOTE — Progress Notes (Addendum)
       Overnight   [name] MRN: [id number] DOB : March 23, 1921    Date of Service   12/10/2022   HPI/Events of Note    Notified by RN for patient with difficulty breathing, possible change in mentation.  Bedside visit: Patient was admitted obvious respiratory difficulty with clearly audible congested respirations tachycardic, tachypneic, with a decreasing SpO2 to the high 70s/ low 80s.  Nonrebreather mask applied. Nebulizer treatment started.  Nasotracheal suctioning conducted, post nebulizer treatment. Nasotracheal suctioning yields thick white secretions with obvious improvement in respiratory effort.  Second nebulizer treatment with Mucomyst/Xopenex and second NT suction yield moderate amount of thick white secretions and obvious improvement in Respiratory effort and rate.  HR is now 100 RR     20 SPO2 99% on 5 LPM BP 105/47   CXR obtained :  "FINDINGS: Chronic lung disease with diffuse interstitial reticulation. Progression of patchy bilateral airspace disease especially at the right apex. No effusion or pneumothorax. Stable heart size and mediastinal contours.   IMPRESSION: Worsening pneumonia superimposed on interstitial lung disease.    Electronically Signed   By: Tiburcio Pea M.D.   On: 12/10/2022 04:29"   Staff has been unable to contact Family as of this time.   Interventions/ Plan   Continue previous Attending orders Antibiotics are scheduled to start Continue NTS and Nebs as ordered/needed      Chinita Greenland BSN MSNA MSN ACNPC-AG Acute Care Nurse Practitioner [facility]

## 2022-12-11 ENCOUNTER — Inpatient Hospital Stay (HOSPITAL_COMMUNITY): Payer: Medicare Other

## 2022-12-11 DIAGNOSIS — I350 Nonrheumatic aortic (valve) stenosis: Secondary | ICD-10-CM | POA: Diagnosis not present

## 2022-12-11 DIAGNOSIS — I5031 Acute diastolic (congestive) heart failure: Secondary | ICD-10-CM | POA: Diagnosis not present

## 2022-12-11 DIAGNOSIS — J189 Pneumonia, unspecified organism: Secondary | ICD-10-CM | POA: Diagnosis not present

## 2022-12-11 DIAGNOSIS — I214 Non-ST elevation (NSTEMI) myocardial infarction: Secondary | ICD-10-CM | POA: Diagnosis not present

## 2022-12-11 LAB — BASIC METABOLIC PANEL
Anion gap: 10 (ref 5–15)
BUN: 21 mg/dL (ref 8–23)
CO2: 22 mmol/L (ref 22–32)
Calcium: 8.6 mg/dL — ABNORMAL LOW (ref 8.9–10.3)
Chloride: 100 mmol/L (ref 98–111)
Creatinine, Ser: 0.61 mg/dL (ref 0.44–1.00)
GFR, Estimated: >60 mL/min (ref >60)
Glucose, Bld: 130 mg/dL — ABNORMAL HIGH (ref 70–99)
Potassium: 3.5 mmol/L (ref 3.5–5.1)
Sodium: 132 mmol/L — ABNORMAL LOW (ref 135–145)

## 2022-12-11 LAB — ECHOCARDIOGRAM COMPLETE
AR max vel: 0.98 cm2
AV Area VTI: 0.74 cm2
AV Area mean vel: 0.94 cm2
AV Mean grad: 38 mm[Hg]
AV Peak grad: 67.1 mm[Hg]
Ao pk vel: 4.1 m/s
Area-P 1/2: 4.39 cm2
Calc EF: 61.3 %
Height: 64 in
MV VTI: 1.95 cm2
S' Lateral: 2.5 cm
Single Plane A2C EF: 61.7 %
Single Plane A4C EF: 62.5 %
Weight: 2335.11 [oz_av]

## 2022-12-11 LAB — CBC
HCT: 29.2 % — ABNORMAL LOW (ref 36.0–46.0)
Hemoglobin: 9.8 g/dL — ABNORMAL LOW (ref 12.0–15.0)
MCH: 32.5 pg (ref 26.0–34.0)
MCHC: 33.6 g/dL (ref 30.0–36.0)
MCV: 96.7 fL (ref 80.0–100.0)
Platelets: 391 10*3/uL (ref 150–400)
RBC: 3.02 MIL/uL — ABNORMAL LOW (ref 3.87–5.11)
RDW: 14.2 % (ref 11.5–15.5)
WBC: 22.1 10*3/uL — ABNORMAL HIGH (ref 4.0–10.5)
nRBC: 0 % (ref 0.0–0.2)

## 2022-12-11 LAB — TROPONIN I (HIGH SENSITIVITY)
Troponin I (High Sensitivity): 225 ng/L (ref <18)
Troponin I (High Sensitivity): 238 ng/L (ref <18)

## 2022-12-11 LAB — BRAIN NATRIURETIC PEPTIDE: B Natriuretic Peptide: 766.2 pg/mL — ABNORMAL HIGH (ref 0.0–100.0)

## 2022-12-11 MED ORDER — PIPERACILLIN-TAZOBACTAM 3.375 G IVPB
3.3750 g | Freq: Three times a day (TID) | INTRAVENOUS | Status: DC
  Administered 2022-12-11 – 2022-12-15 (×12): 3.375 g via INTRAVENOUS
  Filled 2022-12-11 (×12): qty 50

## 2022-12-11 MED ORDER — METOPROLOL TARTRATE 25 MG PO TABS
12.5000 mg | ORAL_TABLET | Freq: Two times a day (BID) | ORAL | Status: DC
  Administered 2022-12-11 – 2022-12-15 (×9): 12.5 mg via ORAL
  Filled 2022-12-11 (×9): qty 1

## 2022-12-11 MED ORDER — VANCOMYCIN HCL 750 MG/150ML IV SOLN
750.0000 mg | INTRAVENOUS | Status: DC
  Administered 2022-12-12 – 2022-12-13 (×2): 750 mg via INTRAVENOUS
  Filled 2022-12-11 (×2): qty 150

## 2022-12-11 MED ORDER — NITROGLYCERIN 0.4 MG SL SUBL: 0.4000 mg | SUBLINGUAL_TABLET | SUBLINGUAL | Status: DC | PRN

## 2022-12-11 MED ORDER — SODIUM CHLORIDE 0.9 % IV SOLN
500.0000 mg | INTRAVENOUS | Status: DC
  Filled 2022-12-11: qty 5

## 2022-12-11 MED ORDER — PANTOPRAZOLE SODIUM 40 MG IV SOLR
40.0000 mg | Freq: Two times a day (BID) | INTRAVENOUS | Status: DC
Start: 2022-12-11 — End: 2022-12-13
  Administered 2022-12-11 – 2022-12-13 (×5): 40 mg via INTRAVENOUS
  Filled 2022-12-11 (×5): qty 10

## 2022-12-11 MED ORDER — KETOROLAC TROMETHAMINE 15 MG/ML IJ SOLN
7.5000 mg | Freq: Once | INTRAMUSCULAR | Status: AC
  Administered 2022-12-11: 7.5 mg via INTRAVENOUS
  Filled 2022-12-11: qty 1

## 2022-12-11 MED ORDER — MELATONIN 5 MG PO TABS
5.0000 mg | ORAL_TABLET | Freq: Every evening | ORAL | Status: AC | PRN
  Administered 2022-12-11 – 2022-12-14 (×3): 5 mg via ORAL
  Filled 2022-12-11 (×3): qty 1

## 2022-12-11 MED ORDER — KETOROLAC TROMETHAMINE 15 MG/ML IJ SOLN
15.0000 mg | Freq: Once | INTRAMUSCULAR | Status: AC
  Administered 2022-12-11: 15 mg via INTRAVENOUS
  Filled 2022-12-11: qty 1

## 2022-12-11 MED ORDER — FUROSEMIDE 10 MG/ML IJ SOLN
20.0000 mg | Freq: Once | INTRAMUSCULAR | Status: AC
  Administered 2022-12-11: 20 mg via INTRAVENOUS
  Filled 2022-12-11: qty 2

## 2022-12-11 MED ORDER — ALPRAZOLAM 0.25 MG PO TABS
0.1250 mg | ORAL_TABLET | Freq: Every day | ORAL | Status: DC | PRN
  Administered 2022-12-13 – 2022-12-14 (×2): 0.125 mg via ORAL
  Filled 2022-12-11 (×2): qty 1

## 2022-12-11 MED ORDER — TORSEMIDE 20 MG PO TABS
20.0000 mg | ORAL_TABLET | Freq: Once | ORAL | Status: AC
  Administered 2022-12-11: 20 mg via ORAL
  Filled 2022-12-11: qty 1

## 2022-12-11 MED ORDER — HYDROCOD POLI-CHLORPHE POLI ER 10-8 MG/5ML PO SUER
5.0000 mL | Freq: Two times a day (BID) | ORAL | Status: AC | PRN
  Administered 2022-12-11 – 2022-12-13 (×3): 5 mL via ORAL
  Filled 2022-12-11 (×4): qty 5

## 2022-12-11 MED ORDER — HYDROCOD POLI-CHLORPHE POLI ER 10-8 MG/5ML PO SUER
5.0000 mL | Freq: Once | ORAL | Status: AC
  Administered 2022-12-11: 5 mL via ORAL
  Filled 2022-12-11: qty 5

## 2022-12-11 MED ORDER — VANCOMYCIN HCL 1250 MG/250ML IV SOLN
1250.0000 mg | Freq: Once | INTRAVENOUS | Status: AC
  Administered 2022-12-11: 1250 mg via INTRAVENOUS
  Filled 2022-12-11: qty 250

## 2022-12-11 NOTE — Progress Notes (Signed)
PROGRESS NOTE    [name]  ZDG:[id number] DOB: [birth year] DOA: 12/09/2022 PCP: Shireen Quan, DO   Brief Narrative: 89 year old with past medical history significant for grade 2 diastolic heart failure, preserved ejection fraction, frequent PVC and bradycardia, hypertension, hypothyroidism and recurrent UTI, chronic dyspnea, hypothyroidism, nephrolithiasis, interstitial lung disease presents complaining of cough, congestion, decreased appetite for 1 week.  Evaluation in the ED she was noted to be tachypneic oxygen saturation 92 on room air, chest x-ray increase bilateral interstitial and patchy opacity which could represent pulmonary edema or multifocal pneumonia.  Patient was admitted for multifocal pneumonia,  the same night of admission developed acute hypoxic respiratory failure and was placed on 5 L of oxygen.     Assessment & Plan:   Principal Problem:   Multifocal pneumonia Active Problems:   Chronic diastolic CHF (congestive heart failure) (HCC)   AKI (acute kidney injury) (HCC)   Hyponatremia   Interstitial lung disease (HCC)   Hypothyroidism   Reactive airway disease   Acute CHF (congestive heart failure) (HCC)   GAD (generalized anxiety disorder)   Age-related physical debility   1-Acute Hypoxic Respiratory failure; in setting of acute Multifocal  PNA, also component aspiration PNA>  -Continue with nebulizer, Mucomyst, Xopenex.  -Continue with Guaifenesin.  -Discussed with Husband and son at bedside, patient would not want to be resuscitated or be place on Life support. DNR, DNI.  -she is down to 3 L oxygen.  -Respiratory panel negative, COVID RSV negative WBC increasing, change IV antibiotics to Vancomycin and Zosyn.   Acute Metabolic Encephalopathy, in setting of PNA , hypoxia and or medications. Received a dose of ativan.  Avoid Ativan.    Acute on Chronic diastolic heart failure: Severe Aortic stenosis and moderate mitral stenosis.  Plan to start IV lasix,  BP increasing. Received a dose today.  Elevation of troponin, cardiology consulted.    AKI: Prerenal received  IV fluids.  Hyponatremia: Monitor on fluids  Interstitial lung disease: Continue with nebulizer  Hypothyroidism: Resume Synthroid.   History of PVC and bradycardia:  Resume low dose metoprolol/   Generalized anxiety disorder: resume Zoloft Resume home dose xabax, low dose  Advanced age and debility : PT OT consulted Hypokalemia; replaced  Estimated body mass index is 25.05 kg/m as calculated from the following:   Height as of this encounter: 5\' 4"  (1.626 m).   Weight as of this encounter: 66.2 kg.   DVT prophylaxis: Lovenox Code Status: DNR, discussed with husband and son. 11/27 Family Communication: Disposition Plan:  Status is: Inpatient Remains inpatient appropriate because: management of PNA    Consultants:  None  Procedures:  None  Antimicrobials:  Ceftriaxone, azithro  Subjective: Report pleuritic chest pin. Report dyspnea. Doesn't feel good. She is anxious.   Objective: Vitals:   12/11/22 0424 12/11/22 0556 12/11/22 0800 12/11/22 0810  BP:  (!) 122/54  (!) 137/55  Pulse:  92  89  Resp:    19  Temp:    98.7 F (37.1 C)  TempSrc:    Oral  SpO2:   97% 100%  Weight: 66.2 kg     Height:        Intake/Output Summary (Last 24 hours) at 12/11/2022 0905 Last data filed at 12/11/2022 0750 Gross per 24 hour  Intake 398.93 ml  Output 400 ml  Net -1.07 ml   Filed Weights   12/09/22 1448 12/10/22 0500 12/11/22 0424  Weight: 62.9 kg 66.1 kg 66.2 kg    Examination:  General exam: NAD Respiratory system: BL ronchus.  Cardiovascular system: S 1, S 2 RRR Gastrointestinal system: BS present, soft, nt Central nervous system: more awake today      Data Reviewed: I have personally reviewed following labs and imaging studies  CBC: Recent Labs  Lab 12/09/22 1521 12/10/22 0424 12/11/22 0433  WBC 18.0* 21.1* 22.1*  HGB 10.1* 10.2*  9.8*  HCT 30.0* 31.2* 29.2*  MCV 95.2 99.4 96.7  PLT 361 377 391   Basic Metabolic Panel: Recent Labs  Lab 12/09/22 1521 12/10/22 0424 12/11/22 0433  NA 133* 133* 132*  K 3.7 3.4* 3.5  CL 98 99 100  CO2 24 22 22   GLUCOSE 128* 178* 130*  BUN 36* 33* 21  CREATININE 1.09* 0.78 0.61  CALCIUM 9.8 8.8* 8.6*   GFR: Estimated Creatinine Clearance: 35.9 mL/min (by C-G formula based on SCr of 0.61 mg/dL). Liver Function Tests: Recent Labs  Lab 12/10/22 0424  AST 28  ALT 32  ALKPHOS 114  BILITOT 1.2*  PROT 7.1  ALBUMIN 2.6*   No results for input(s): "LIPASE", "AMYLASE" in the last 168 hours. No results for input(s): "AMMONIA" in the last 168 hours. Coagulation Profile: No results for input(s): "INR", "PROTIME" in the last 168 hours. Cardiac Enzymes: No results for input(s): "CKTOTAL", "CKMB", "CKMBINDEX", "TROPONINI" in the last 168 hours. BNP (last 3 results) No results for input(s): "PROBNP" in the last 8760 hours. HbA1C: No results for input(s): "HGBA1C" in the last 72 hours. CBG: Recent Labs  Lab 12/10/22 0251  GLUCAP 160*   Lipid Profile: No results for input(s): "CHOL", "HDL", "LDLCALC", "TRIG", "CHOLHDL", "LDLDIRECT" in the last 72 hours. Thyroid Function Tests: No results for input(s): "TSH", "T4TOTAL", "FREET4", "T3FREE", "THYROIDAB" in the last 72 hours. Anemia Panel: No results for input(s): "VITAMINB12", "FOLATE", "FERRITIN", "TIBC", "IRON", "RETICCTPCT" in the last 72 hours. Sepsis Labs: Recent Labs  Lab 12/09/22 1738 12/10/22 0424  PROCALCITON  --  0.70  LATICACIDVEN 0.8  --     Recent Results (from the past 240 hour(s))  Resp panel by RT-PCR (RSV, Flu A&B, Covid) Anterior Nasal Swab     Status: None   Collection Time: 12/09/22  2:53 PM   Specimen: Anterior Nasal Swab  Result Value Ref Range Status   SARS Coronavirus 2 by RT PCR NEGATIVE NEGATIVE Final    Comment: (NOTE) SARS-CoV-2 target nucleic acids are NOT DETECTED.  The SARS-CoV-2 RNA  is generally detectable in upper respiratory specimens during the acute phase of infection. The lowest concentration of SARS-CoV-2 viral copies this assay can detect is 138 copies/mL. A negative result does not preclude SARS-Cov-2 infection and should not be used as the sole basis for treatment or other patient management decisions. A negative result may occur with  improper specimen collection/handling, submission of specimen other than nasopharyngeal swab, presence of viral mutation(s) within the areas targeted by this assay, and inadequate number of viral copies(<138 copies/mL). A negative result must be combined with clinical observations, patient history, and epidemiological information. The expected result is Negative.  Fact Sheet for Patients:  BloggerCourse.com  Fact Sheet for Healthcare Providers:  SeriousBroker.it  This test is no t yet approved or cleared by the [place] FDA and  has been authorized for detection and/or diagnosis of SARS-CoV-2 by FDA under an Emergency Use Authorization (EUA). This EUA will remain  in effect (meaning this test can be used) for the duration of the COVID-19 declaration under Section 564(b)(1) of the Act, 21 U.S.C.section 360bbb-3(b)(1),  unless the authorization is terminated  or revoked sooner.       Influenza A by PCR NEGATIVE NEGATIVE Final   Influenza B by PCR NEGATIVE NEGATIVE Final    Comment: (NOTE) The Xpert Xpress SARS-CoV-2/FLU/RSV plus assay is intended as an aid in the diagnosis of influenza from Nasopharyngeal swab specimens and should not be used as a sole basis for treatment. Nasal washings and aspirates are unacceptable for Xpert Xpress SARS-CoV-2/FLU/RSV testing.  Fact Sheet for Patients: BloggerCourse.com  Fact Sheet for Healthcare Providers: SeriousBroker.it  This test is not yet approved or cleared by the  [place] FDA and has been authorized for detection and/or diagnosis of SARS-CoV-2 by FDA under an Emergency Use Authorization (EUA). This EUA will remain in effect (meaning this test can be used) for the duration of the COVID-19 declaration under Section 564(b)(1) of the Act, 21 U.S.C. section 360bbb-3(b)(1), unless the authorization is terminated or revoked.     Resp Syncytial Virus by PCR NEGATIVE NEGATIVE Final    Comment: (NOTE) Fact Sheet for Patients: BloggerCourse.com  Fact Sheet for Healthcare Providers: SeriousBroker.it  This test is not yet approved or cleared by the [place] FDA and has been authorized for detection and/or diagnosis of SARS-CoV-2 by FDA under an Emergency Use Authorization (EUA). This EUA will remain in effect (meaning this test can be used) for the duration of the COVID-19 declaration under Section 564(b)(1) of the Act, 21 U.S.C. section 360bbb-3(b)(1), unless the authorization is terminated or revoked.  Performed at [facility], [address] [place], [place] [address]   Blood culture (routine x 2)     Status: None (Preliminary result)   Collection Time: 12/09/22  5:38 PM   Specimen: BLOOD LEFT FOREARM  Result Value Ref Range Status   Specimen Description   Final    BLOOD LEFT FOREARM Performed at [facility], [address] [place], [place] [address]    Special Requests   Final    BOTTLES DRAWN AEROBIC AND ANAEROBIC Blood Culture adequate volume Performed at [facility], [address] [place], [place] [address]    Culture   Final    NO GROWTH 2 DAYS Performed at [facility], [address], [place], [place] [address]    Report Status PENDING  Incomplete  Blood culture (routine x 2)     Status: None (Preliminary result)   Collection Time: 12/09/22  5:39 PM   Specimen: BLOOD RIGHT FOREARM  Result Value Ref Range Status    Specimen Description   Final    BLOOD RIGHT FOREARM Performed at [facility], [address], [place], [place] [address]    Special Requests   Final    BOTTLES DRAWN AEROBIC AND ANAEROBIC Blood Culture adequate volume Performed at [facility], [address], [place], [place] [address]    Culture   Final    NO GROWTH 2 DAYS Performed at [facility], [address] [place], [place] [address]    Report Status PENDING  Incomplete  Respiratory (~20 pathogens) panel by PCR     Status: None   Collection Time: 12/10/22  2:30 PM   Specimen: Nasopharyngeal Swab; Respiratory  Result Value Ref Range Status   Adenovirus NOT DETECTED NOT DETECTED Final   Coronavirus 229E NOT DETECTED NOT DETECTED Final    Comment: (NOTE) The Coronavirus on the Respiratory Panel, DOES NOT test for the novel  Coronavirus (2019 nCoV)  Coronavirus HKU1 NOT DETECTED NOT DETECTED Final   Coronavirus NL63 NOT DETECTED NOT DETECTED Final   Coronavirus OC43 NOT DETECTED NOT DETECTED Final   Metapneumovirus NOT DETECTED NOT DETECTED Final   Rhinovirus / Enterovirus NOT DETECTED NOT DETECTED Final   Influenza A NOT DETECTED NOT DETECTED Final   Influenza B NOT DETECTED NOT DETECTED Final   Parainfluenza Virus 1 NOT DETECTED NOT DETECTED Final   Parainfluenza Virus 2 NOT DETECTED NOT DETECTED Final   Parainfluenza Virus 3 NOT DETECTED NOT DETECTED Final   Parainfluenza Virus 4 NOT DETECTED NOT DETECTED Final   Respiratory Syncytial Virus NOT DETECTED NOT DETECTED Final   Bordetella pertussis NOT DETECTED NOT DETECTED Final   Bordetella Parapertussis NOT DETECTED NOT DETECTED Final   Chlamydophila pneumoniae NOT DETECTED NOT DETECTED Final   Mycoplasma pneumoniae NOT DETECTED NOT DETECTED Final    Comment: Performed at [facility], [address] [place], [place] [address]  Expectorated Sputum Assessment w Gram Stain, Rflx to Resp Cult     Status: None   Collection Time:  12/10/22  2:53 PM   Specimen: Expectorated Sputum  Result Value Ref Range Status   Specimen Description EXPECTORATED SPUTUM  Final   Special Requests Normal  Final   Sputum evaluation   Final    THIS SPECIMEN IS ACCEPTABLE FOR SPUTUM CULTURE Performed at [facility], [address], [place], [place] [address]    Report Status 12/10/2022 FINAL  Final  Culture, Respiratory w Gram Stain     Status: None (Preliminary result)   Collection Time: 12/10/22  2:53 PM  Result Value Ref Range Status   Specimen Description   Final    EXPECTORATED SPUTUM Performed at [facility], [address] [place], [place] [address]    Special Requests   Final    Normal Reflexed from [id number] Performed at [facility], [address], [place], [place] [address]    Gram Stain   Final    FEW WBC PRESENT, PREDOMINANTLY PMN RARE GRAM POSITIVE COCCI Performed at [facility], [address] [place], [place] [address]    Culture PENDING  Incomplete   Report Status PENDING  Incomplete         Radiology Studies: DG Chest Port 1 View  Result Date: 12/11/2022 CLINICAL DATA:  [id number], [id number]. Chest pain and shortness of breath, fever. EXAM: PORTABLE CHEST 1 VIEW COMPARISON:  Portable chest yesterday at 3:37 a.m. FINDINGS: 2:38 a.m. There is mild cardiomegaly. Central vascular prominence and mild interstitial edema continue to be seen with small pleural effusions. There are increased bilateral perihilar opacities which could be alveolar edema or pneumonia, with persistent patchy airspace disease in the right upper lobe which probably is due to pneumonia. Findings are superimposed on chronic interstitial lung disease. The mediastinum is stable. There is aortic atherosclerosis.  No new osseous findings. Thoracic spondylosis.  Chronic right humeral ORIF. IMPRESSION: 1. Increased bilateral perihilar opacities which could be alveolar edema or pneumonia, with  persistent patchy airspace disease in the right upper lobe which probably is due to pneumonia. 2. Findings are superimposed on chronic interstitial lung disease. 3. Cardiomegaly with central vascular prominence and mild interstitial edema. 4. Small pleural effusions. 5. Aortic atherosclerosis. Electronically Signed   By: Almira Bar M.D.   On: 12/11/2022 04:30   DG CHEST PORT 1 VIEW  Result Date: 12/10/2022 CLINICAL DATA:  Respiratory compromise. Increased shortness of breath and hypoxia EXAM: PORTABLE  CHEST 1 VIEW COMPARISON:  12/09/2022 FINDINGS: Chronic lung disease with diffuse interstitial reticulation. Progression of patchy bilateral airspace disease especially at the right apex. No effusion or pneumothorax. Stable heart size and mediastinal contours. IMPRESSION: Worsening pneumonia superimposed on interstitial lung disease. Electronically Signed   By: Tiburcio Pea M.D.   On: 12/10/2022 04:29   DG Chest Port 1 View  Result Date: 12/09/2022 CLINICAL DATA:  Three day history of productive cough, shortness of breath, decreased appetite EXAM: PORTABLE CHEST 1 VIEW COMPARISON:  Chest radiograph dated 01/31/2021, CT chest dated 07/22/2021 FINDINGS: Normal lung volumes. Increased bilateral interstitial and patchy opacities. Blunting of the bilateral costophrenic angles. No pneumothorax. The heart size and mediastinal contours are within normal limits. Partially imaged right proximal humeral fixation hardware. IMPRESSION: 1. Increased bilateral interstitial and patchy opacities, which may represent pulmonary edema or multifocal pneumonia. 2. Blunting of the bilateral costophrenic angles, which may represent small pleural effusions. Electronically Signed   By: Agustin Cree M.D.   On: 12/09/2022 16:51        Scheduled Meds:  acetylcysteine  4 mL Nebulization Q6H   enoxaparin (LOVENOX) injection  40 mg Subcutaneous Daily   guaiFENesin  1,200 mg Oral BID   ipratropium  0.5 mg Nebulization Q6H    ketorolac  7.5 mg Intravenous Once   levalbuterol  0.63 mg Nebulization Q6H   levothyroxine  75 mcg Oral Q0600   metoprolol tartrate  12.5 mg Oral BID   pantoprazole (PROTONIX) IV  40 mg Intravenous Q12H   sertraline  100 mg Oral Daily   sodium chloride flush  3 mL Intravenous Q12H   Continuous Infusions:  piperacillin-tazobactam (ZOSYN)  IV     vancomycin       LOS: 2 days    Time spent: 35 minutes    Arvada Seaborn A Salam Chesterfield, MD [facility]   If 7PM-7AM, please contact night-coverage [email or web address]  12/11/2022, 9:05 AM

## 2022-12-11 NOTE — Progress Notes (Signed)
  Echocardiogram 2D Echocardiogram has been performed.  [name] 12/11/2022, 12:03 PM

## 2022-12-11 NOTE — Progress Notes (Signed)
Pharmacy Antibiotic Note  [name] is a 89 y.o. female admitted on 12/09/2022 with pneumonia.  Pharmacy has been consulted for Vanco, Zosyn dosing.  ID: Worsening pneumonia superimposed on interstitial lung disease. Poss some aspiration, - Afebrile, WBC 22.1 up, Scr <1  Rocephin 11/26>>11/28 Doxy 11/26>>11/28 Azithro 11/26 x 1 Vanco 11/28>> Zosyn 11/28>>  Plan: D/c Azithro Vanco 1250mg  x 1 Vancomycin 750 mg IV Q 24 hrs. Goal AUC 400-550. Expected AUC: 493 SCr used: 0.8  Zosyn 3.375g IV q8hr  Height: 5\' 4"  (162.6 cm) Weight: 66.2 kg (145 lb 15.1 oz) IBW/kg (Calculated) : 54.7  Temp (24hrs), Avg:98.3 F (36.8 C), Min:97.6 F (36.4 C), Max:99 F (37.2 C)  Recent Labs  Lab 12/09/22 1521 12/09/22 1738 12/10/22 0424 12/11/22 0433  WBC 18.0*  --  21.1* 22.1*  CREATININE 1.09*  --  0.78 0.61  LATICACIDVEN  --  0.8  --   --     Estimated Creatinine Clearance: 35.9 mL/min (by C-G formula based on SCr of 0.61 mg/dL).    Allergies  Allergen Reactions   Diltiazem Hcl Er Beads Other (See Comments)    Dizziness (not present on the [facility] in 2024)    [place] Rollyson S. Merilynn Finland, PharmD, BCPS Clinical Staff Pharmacist  Misty Stanley [facility] 12/11/2022 9:56 AM

## 2022-12-11 NOTE — Progress Notes (Signed)
   12/11/22 0158  Assess: MEWS Score  Temp 99 F (37.2 C)  BP (!) 155/70  MAP (mmHg) 95  Pulse Rate (!) 119  Resp (!) 28  SpO2 94 %  Assess: MEWS Score  MEWS Temp 0  MEWS Systolic 0  MEWS Pulse 2  MEWS RR 2  MEWS LOC 0  MEWS Score 4  MEWS Score Color Red  Assess: if the MEWS score is Yellow or Red  Were vital signs accurate and taken at a resting state? Yes  Does the patient meet 2 or more of the SIRS criteria? Yes  Does the patient have a confirmed or suspected source of infection? Yes  MEWS guidelines implemented  Yes, red  Treat  MEWS Interventions Considered administering scheduled or prn medications/treatments as ordered  Take Vital Signs  Increase Vital Sign Frequency  Red: Q1hr x2, continue Q4hrs until patient remains green for 12hrs  Escalate  MEWS: Escalate Red: Discuss with charge nurse and notify provider. Consider notifying RRT. If remains red for 2 hours consider need for higher level of care  Notify: Charge Nurse/RN  Name of Charge Nurse/RN Notified Lauren Hopkins  Provider Notification  Provider Name/Title Jolene Schimke, NP  Date Provider Notified 12/11/22  Time Provider Notified 0219  Method of Notification Page  Notification Reason Change in status (Red mews and chest pain)  Provider response At bedside  Date of Provider Response 12/11/22  Time of Provider Response 0200  Notify: Rapid Response  Name of Rapid Response RN Notified Loney Laurence  Date Rapid Response Notified 12/11/22  Time Rapid Response Notified 0219  Assess: SIRS CRITERIA  SIRS Temperature  0  SIRS Pulse 1  SIRS Respirations  1  SIRS WBC 0  SIRS Score Sum  2

## 2022-12-11 NOTE — TOC Progression Note (Signed)
Transition of Care [facility]) - Progression Note    Patient Details  [name] MRN: [id number] Date of Birth: 1921/09/07  [facility] CM/SW Contact  Darleene Cleaver, [place] Phone Number: 12/11/2022, 1:26 PM  Clinical Narrative:     PT recommending SNF, patient is from [facility].  Patient has been faxed out to [facility].  Expected Discharge Plan:  (TBD) Barriers to Discharge: Continued Medical Work up  Expected Discharge Plan and Services    [facility].                                           Social Determinants of Health (SDOH) Interventions SDOH Screenings   Food Insecurity: No Food Insecurity (12/10/2022)  Housing: Low Risk  (12/10/2022)  Transportation Needs: No Transportation Needs (12/10/2022)  Utilities: Not At Risk (12/10/2022)  Tobacco Use: Low Risk  (12/10/2022)    Readmission Risk Interventions     No data to display

## 2022-12-11 NOTE — [place] FL2 (Signed)
[place] MEDICAID FL2 LEVEL OF CARE FORM     IDENTIFICATION  Patient [name] Birthdate: Dec 07, 1921 Sex: female Admission Date (Current Location): 12/09/2022  [facility] and [place] Number:  Producer, television/film/video and Address:  [facility],  [address] [place], [place] [address]      Provider Number: [id number]  Attending Physician Name and Address:  Alba Cory, MD  Relative Name and Phone Number:  Annyka, Kienbaum   [phone]  Tawan, Zaccheo Spouse   [phone]    Current Level of Care: Hospital Recommended Level of Care: Skilled Nursing Facility Prior Approval Number:    Date Approved/Denied:   PASRR Number: [id number]  Discharge Plan: SNF    Current Diagnoses: Patient Active Problem List   Diagnosis Date Noted   AKI (acute kidney injury) (HCC) 12/10/2022   Hyponatremia 12/10/2022   Reactive airway disease 12/10/2022   Interstitial lung disease (HCC) 12/10/2022   Acute CHF (congestive heart failure) (HCC) 12/10/2022   GAD (generalized anxiety disorder) 12/10/2022   Age-related physical debility 12/10/2022   Multifocal pneumonia 12/09/2022   Other intervertebral disc degeneration, lumbar region 09/23/2022   No-show for appointment 04/22/2019   Frequent PVCs    Bradycardia    Hypertensive heart disease    Hyperlipidemia 02/02/2014   Fracture of humerus, proximal, right, closed 10/31/2013   Proximal humerus fracture 10/31/2013   UTI (urinary tract infection) 09/24/2013   Acute on chronic diastolic congestive heart failure (HCC) 09/24/2013   Abnormal EKG 09/24/2013   Acute on chronic diastolic heart failure (HCC) 09/24/2013   Infection of left eye 09/24/2013   Near syncope 09/24/2012   Hypothyroidism 10/30/2011   Chest pain 10/29/2011   Chronic diastolic CHF (congestive heart failure) (HCC) 07/05/2010   Nonspecific (abnormal) findings on radiological and other examination of body structure 11/12/2009   CT, CHEST, ABNORMAL  11/12/2009   DYSPNEA ON EXERTION 06/05/2009    Orientation RESPIRATION BLADDER Height & Weight     Self, Time, Situation, Place  O2 (2L Nasal Cannula) Continent Weight: 145 lb 15.1 oz (66.2 kg) Height:  5\' 4"  (162.6 cm)  BEHAVIORAL SYMPTOMS/MOOD NEUROLOGICAL BOWEL NUTRITION STATUS      Continent Diet  AMBULATORY STATUS COMMUNICATION OF NEEDS Skin   Limited Assist Verbally Normal                       Personal Care Assistance Level of Assistance  Bathing, Feeding, Dressing Bathing Assistance: Limited assistance Feeding assistance: Independent Dressing Assistance: Limited assistance     Functional Limitations Info  Hearing, Speech, Sight Sight Info: Adequate Hearing Info: Adequate Speech Info: Adequate    SPECIAL CARE FACTORS FREQUENCY  PT (By licensed PT), OT (By licensed OT)     PT Frequency: Minimum 5x a week OT Frequency: Minimum 5x a week            Contractures Contractures Info: Not present    Additional Factors Info  Code Status, Allergies, Psychotropic Code Status Info: DNR Allergies Info: Diltiazem Hcl Er Beads Psychotropic Info: sertraline (ZOLOFT) tablet 100 mg         Current Medications (12/11/2022):  This is the current hospital active medication list Current Facility-Administered Medications  Medication Dose Route Frequency Provider Last Rate Last Admin   acetaminophen (TYLENOL) tablet 650 mg  650 mg Oral Q6H PRN Janalyn Shy, Subrina, MD   650 mg at 12/10/22 1451   Or   acetaminophen (TYLENOL) suppository 650 mg  650 mg Rectal Q6H  PRN Janalyn Shy Subrina, MD       acetylcysteine (MUCOMYST) 20 % nebulizer / oral solution 4 mL  4 mL Nebulization Q6H Luiz Iron, NP   4 mL at 12/11/22 0800   ALPRAZolam (XANAX) tablet 0.125 mg  0.125 mg Oral Daily PRN Regalado, Belkys A, MD       enoxaparin (LOVENOX) injection 40 mg  40 mg Subcutaneous Daily Norva Pavlov, RPH   40 mg at 12/11/22 0944   guaiFENesin (MUCINEX) 12 hr tablet 1,200 mg  1,200 mg  Oral BID Regalado, Belkys A, MD   1,200 mg at 12/11/22 0944   ipratropium (ATROVENT) nebulizer solution 0.5 mg  0.5 mg Nebulization Q4H PRN Luiz Iron, NP   0.5 mg at 12/10/22 0835   ipratropium (ATROVENT) nebulizer solution 0.5 mg  0.5 mg Nebulization Q6H Regalado, Belkys A, MD   0.5 mg at 12/11/22 0800   levalbuterol (XOPENEX) nebulizer solution 0.63 mg  0.63 mg Nebulization Q6H Luiz Iron, NP   0.63 mg at 12/11/22 0759   levothyroxine (SYNTHROID) tablet 75 mcg  75 mcg Oral Q0600 Regalado, Belkys A, MD   75 mcg at 12/11/22 0548   metoprolol tartrate (LOPRESSOR) tablet 12.5 mg  12.5 mg Oral BID Regalado, Belkys A, MD   12.5 mg at 12/11/22 0944   nitroGLYCERIN (NITROSTAT) SL tablet 0.4 mg  0.4 mg Sublingual Q5 Min x 3 PRN Regalado, Belkys A, MD       ondansetron (ZOFRAN) tablet 4 mg  4 mg Oral Q6H PRN Janalyn Shy, Subrina, MD       Or   ondansetron [facility]) injection 4 mg  4 mg Intravenous Q6H PRN Janalyn Shy, Subrina, MD       Oral care mouth rinse  15 mL Mouth Rinse PRN Regalado, Belkys A, MD       pantoprazole (PROTONIX) injection 40 mg  40 mg Intravenous Q12H Regalado, Belkys A, MD   40 mg at 12/11/22 0943   piperacillin-tazobactam (ZOSYN) IVPB 3.375 g  3.375 g Intravenous Q8H Norva Pavlov, RPH 12.5 mL/hr at 12/11/22 0943 3.375 g at 12/11/22 7829   senna-docusate (Senokot-S) tablet 1 tablet  1 tablet Oral QHS PRN Janalyn Shy, Subrina, MD       sertraline (ZOLOFT) tablet 100 mg  100 mg Oral Daily Sundil, Subrina, MD   100 mg at 12/11/22 0944   sodium chloride flush (NS) 0.9 % injection 3 mL  3 mL Intravenous Q12H Sundil, Subrina, MD   3 mL at 12/11/22 0945   sodium chloride flush (NS) 0.9 % injection 3 mL  3 mL Intravenous PRN Janalyn Shy, Subrina, MD       Melene Muller ON 12/12/2022] vancomycin (VANCOREADY) IVPB 750 mg/150 mL  750 mg Intravenous Q24H Norva Pavlov, [facility]         Discharge Medications: Please see discharge summary for a list of discharge medications.  Relevant Imaging  Results:  Relevant Lab Results:   Additional Information SSN [id number]  Darleene Cleaver, LCSW

## 2022-12-11 NOTE — Plan of Care (Signed)

## 2022-12-11 NOTE — Progress Notes (Addendum)
    Patient [name]           DOB: [birth year]  MRN: [id number]      Admission Date: 12/09/2022  Attending Provider: Alba Cory, MD  Primary Diagnosis: Multifocal pneumonia   Level of care: Progressive    CROSS COVER NOTE   Date of Service   12/11/2022   [name], 89 y.o. female, was admitted on 12/09/2022 for Multifocal pneumonia.    HPI/Events of Note   Chest Pain PMH: Reported to having chest pain at home that is intermittent and chronic, started before past week  10/10 sharp chest pain. States this pain is different from her "regular pain."  Patient describes pain as starting mid- sternum and "spreading all over the chest." Pain is worse when coughing or deep breathing, pain could be pleuritic.  The pain is not reproducible on palpation. Endorses some dyspnea, anxiety. Denies palpitations, dizziness, abdominal pain, nausea, vomiting.    Patient at bedside is alert and appears anxious. She has asked nursing staff to call her family so they can come spend the night.   Respiratory: rhonchus, tachypnea, normal respiratory effort.  Cardiac:  S1 & S2 heard,  tachycardiac, no pedal edema, no JVD,  Addendum:  0240- Reports pain relief after neb tx and toradol . Resting in bed calmly.    0550- No further c/o of pain. Initial Troponin 225, second level should be redrawn ~ 0630.  Could be elevated due to combination of PNA infection, HF. Day team to follow up on troponin.   Chest xray-  "Increased bilateral perihilar opacities which could be alveolar edema or pneumonia, with persistent patchy airspace disease in the right upper lobe. There is mild cardiomegaly, central vascular prominence and mild interstitial edema continue to be seen with small pleural effusions.  Current BP stable, will add lasix 20 mg IV.    Interventions/ Plan   EKG- Sinus tachycardia, with some ST depression to V4, V5 seen on prior EKGs Troponin x2- pending IV Toradol Supplemental  Oxygen as needed.  Nebs PRN Chest xray IV Lasix, 20 mg x1 BNP        Anthoney Harada, DNP, ACNPC- AG [facility] [place]

## 2022-12-11 NOTE — Consult Note (Signed)
CARDIOLOGY CONSULT NOTE  Referring Physician: No ref. provider found  Primary Care: Shireen Quan, DO   HPI: [name] is a 89 y.o. female with HFpEF, hypertension, hypothyroidism, recurrent UTIs, chronic dyspnea, hypothyroidism and interstitial lung disease currently admitted to [facility] long hospital for multifocal pneumonia.  Patient was originally admitted on 12/10/2022 with chest x-ray concerning for new onset multifocal pneumonia.  CBC with white cell count of 18,000, BNP 380.  She was subsequently started on azithromycin and Rocephin for community-acquired pneumonia.  Cardiology consulted today for elevated troponin. Patient unfortunately poor historian. According to her she has had mild chest pain associated with shortness of breath. No reported radiation to her arms; no associated diaphoresis or symptoms of typical angina. During admission troponins have been stable around 238.    Past Medical History:  Diagnosis Date   Anxiety    Aortic stenosis    a. mild by echo 2017.   Arthritis    Bradycardia    a. ? details not clear - in the past has had PVCs which have lowered effective HR count by pulse check.   Chronic diastolic CHF (congestive heart failure) (HCC)    Diastolic dysfunction    mild    Frequent PVCs    a. 48 Hour Holter 10/08/15 showed NSR and sinus tach with frequent PVCs, occasional bigeminy, total PVC burden 16.5%, lowest HR 59.   Hyperlipidemia    Hypertension    Hypertensive heart disease    Hypothyroidism    Kidney stones    Mild mitral regurgitation by prior echocardiogram    Tachycardia    history of tachycardia and bradycardia    Current Facility-Administered Medications  Medication Dose Route Frequency Provider Last Rate Last Admin   acetaminophen (TYLENOL) tablet 650 mg  650 mg Oral Q6H PRN Janalyn Shy, Subrina, MD   650 mg at 12/10/22 1451   Or   acetaminophen (TYLENOL) suppository 650 mg  650 mg Rectal Q6H PRN Janalyn Shy, Subrina, MD       acetylcysteine  (MUCOMYST) 20 % nebulizer / oral solution 4 mL  4 mL Nebulization Q6H Luiz Iron, NP   4 mL at 12/11/22 0800   ALPRAZolam (XANAX) tablet 0.125 mg  0.125 mg Oral Daily PRN Regalado, Belkys A, MD       enoxaparin (LOVENOX) injection 40 mg  40 mg Subcutaneous Daily Norva Pavlov, RPH   40 mg at 12/11/22 0944   guaiFENesin (MUCINEX) 12 hr tablet 1,200 mg  1,200 mg Oral BID Regalado, Belkys A, MD   1,200 mg at 12/11/22 0944   ipratropium (ATROVENT) nebulizer solution 0.5 mg  0.5 mg Nebulization Q4H PRN Luiz Iron, NP   0.5 mg at 12/10/22 0835   ipratropium (ATROVENT) nebulizer solution 0.5 mg  0.5 mg Nebulization Q6H Regalado, Belkys A, MD   0.5 mg at 12/11/22 0800   levalbuterol (XOPENEX) nebulizer solution 0.63 mg  0.63 mg Nebulization Q6H Luiz Iron, NP   0.63 mg at 12/11/22 0759   levothyroxine (SYNTHROID) tablet 75 mcg  75 mcg Oral Q0600 Regalado, Belkys A, MD   75 mcg at 12/11/22 0548   metoprolol tartrate (LOPRESSOR) tablet 12.5 mg  12.5 mg Oral BID Regalado, Belkys A, MD   12.5 mg at 12/11/22 0944   nitroGLYCERIN (NITROSTAT) SL tablet 0.4 mg  0.4 mg Sublingual Q5 Min x 3 PRN Regalado, Belkys A, MD       ondansetron (ZOFRAN) tablet 4 mg  4 mg Oral Q6H PRN  Janalyn Shy Subrina, MD       Or   ondansetron [facility]) injection 4 mg  4 mg Intravenous Q6H PRN Janalyn Shy, Subrina, MD       Oral care mouth rinse  15 mL Mouth Rinse PRN Regalado, Belkys A, MD       pantoprazole (PROTONIX) injection 40 mg  40 mg Intravenous Q12H Regalado, Belkys A, MD   40 mg at 12/11/22 0943   piperacillin-tazobactam (ZOSYN) IVPB 3.375 g  3.375 g Intravenous Q8H Norva Pavlov, RPH 12.5 mL/hr at 12/11/22 0943 3.375 g at 12/11/22 4782   senna-docusate (Senokot-S) tablet 1 tablet  1 tablet Oral QHS PRN Janalyn Shy, Subrina, MD       sertraline (ZOLOFT) tablet 100 mg  100 mg Oral Daily Sundil, Subrina, MD   100 mg at 12/11/22 0944   sodium chloride flush (NS) 0.9 % injection 3 mL  3 mL Intravenous Q12H Sundil,  Subrina, MD   3 mL at 12/11/22 0945   sodium chloride flush (NS) 0.9 % injection 3 mL  3 mL Intravenous PRN Janalyn Shy, Subrina, MD       Melene Muller ON 12/12/2022] vancomycin (VANCOREADY) IVPB 750 mg/150 mL  750 mg Intravenous Q24H Norva Pavlov, RPH        Allergies  Allergen Reactions   Diltiazem Hcl Er Beads Other (See Comments)    Dizziness (not present on the [facility] in 2024)      Social History   Socioeconomic History   Marital status: Married    Spouse name: Not on file   Number of children: Not on file   Years of education: Not on file   Highest education level: Not on file  Occupational History   Not on file  Tobacco Use   Smoking status: Never   Smokeless tobacco: Never  Vaping Use   Vaping status: Never Used  Substance and Sexual Activity   Alcohol use: No   Drug use: No   Sexual activity: Not Currently  Other Topics Concern   Not on file  Social History Narrative   Not on file   Social Determinants of Health   Financial Resource Strain: Not on file  Food Insecurity: No Food Insecurity (12/10/2022)   Hunger Vital Sign    Worried About Running Out of Food in the Last Year: Never true    Ran Out of Food in the Last Year: Never true  Transportation Needs: No Transportation Needs (12/10/2022)   PRAPARE - Administrator, Civil Service (Medical): No    Lack of Transportation (Non-Medical): No  Physical Activity: Not on file  Stress: Not on file  Social Connections: Not on file  Intimate Partner Violence: Not At Risk (12/10/2022)   Humiliation, Afraid, Rape, and Kick questionnaire    Fear of Current or Ex-Partner: No    Emotionally Abused: No    Physically Abused: No    Sexually Abused: No      Family History  Problem Relation Age of Onset   Emphysema Father    Heart failure Mother    Heart attack Brother    Cancer Brother        kidney   Stroke Brother     PHYSICAL EXAM: Vitals:   12/11/22 1100 12/11/22 1236  BP:  (!) 154/69  Pulse:   (!) 106  Resp: 17   Temp:  98.3 F (36.8 C)  SpO2:  97%   GENERAL: elderly female; dyspneic HEENT: Negative for arcus senilis or xanthelasma. There  is no scleral icterus.  The mucous membranes are pink and moist.   NECK: Supple, No masses. Normal carotid upstrokes without bruits. No masses or thyromegaly.    CHEST: There are no chest wall deformities. There is no chest wall tenderness. Respirations are unlabored.  Lungs- decrease lung sounds b/l with significant inspiratory rales diffusely CARDIAC:  JVP: 7 cm H2O         Normal S1, S2   Normal rate with regular rhythm. 4-5/6 SM, rubs or gallops.  Pulses are 2+ and symmetrical in upper and lower extremities. +1 edema.  ABDOMEN: Soft, non-tender, non-distended. There are no masses or hepatomegaly. There are normal bowel sounds.  EXTREMITIES: Warm and well perfused with no cyanosis, clubbing.  LYMPHATIC: No axillary or supraclavicular lymphadenopathy.  NEUROLOGIC: Patient is oriented x3 with no focal or lateralizing neurologic deficits.  PSYCH: Patients affect is appropriate, there is no evidence of anxiety or depression.  SKIN: Warm and dry; no lesions or wounds.   DATA REVIEW  ECG: Sinus tachycardia  ECHO: 12/11/22: LVEF 65% with severe AS   ASSESSMENT & PLAN:  Type II NSTEMI -I suspect her rise in troponin is secondary to underlying multifocal pneumonia and severe aortic stenosis.  At this time given her age, numerous comorbid conditions she is not a candidate for aortic valve replacement and unlikely to benefit from coronary angiography.  She has no EKG changes suggestive of ischemia. -Continue supportive care for multifocal pneumonia. -Mildly hypervolemic on exam today.  Will give low-dose IV Lasix x 1.  2.  Heart failure with preserved ejection fraction -Mildly hypervolemic on exam.  Lasix today and then as needed only.  3.  Severe aortic stenosis -The aortic valve is severely calcified on echo with a mean gradient of 38  mmHg, DVI of 0.27. -Patient is not a candidate for any intervention.  She likely does have some shortness of breath and chest pain from underlying aortic stenosis -Supportive care only at this time.  Cardiology will sign off.   Miranda Frese Advanced Heart Failure Mechanical Circulatory Support

## 2022-12-12 DIAGNOSIS — R7989 Other specified abnormal findings of blood chemistry: Secondary | ICD-10-CM

## 2022-12-12 LAB — BASIC METABOLIC PANEL
Anion gap: 13 (ref 5–15)
BUN: 18 mg/dL (ref 8–23)
CO2: 23 mmol/L (ref 22–32)
Calcium: 8.5 mg/dL — ABNORMAL LOW (ref 8.9–10.3)
Chloride: 98 mmol/L (ref 98–111)
Creatinine, Ser: 0.62 mg/dL (ref 0.44–1.00)
GFR, Estimated: >60 mL/min (ref >60)
Glucose, Bld: 110 mg/dL — ABNORMAL HIGH (ref 70–99)
Potassium: 3.5 mmol/L (ref 3.5–5.1)
Sodium: 134 mmol/L — ABNORMAL LOW (ref 135–145)

## 2022-12-12 LAB — CBC
HCT: 30.6 % — ABNORMAL LOW (ref 36.0–46.0)
Hemoglobin: 10.1 g/dL — ABNORMAL LOW (ref 12.0–15.0)
MCH: 32.6 pg (ref 26.0–34.0)
MCHC: 33 g/dL (ref 30.0–36.0)
MCV: 98.7 fL (ref 80.0–100.0)
Platelets: 369 10*3/uL (ref 150–400)
RBC: 3.1 MIL/uL — ABNORMAL LOW (ref 3.87–5.11)
RDW: 14.4 % (ref 11.5–15.5)
WBC: 24.2 10*3/uL — ABNORMAL HIGH (ref 4.0–10.5)
nRBC: 0 % (ref 0.0–0.2)

## 2022-12-12 LAB — MRSA NEXT GEN BY PCR, NASAL: MRSA by PCR Next Gen: NOT DETECTED

## 2022-12-12 LAB — LEGIONELLA PNEUMOPHILA SEROGP 1 UR AG: L. pneumophila Serogp 1 Ur Ag: NEGATIVE

## 2022-12-12 MED ORDER — IPRATROPIUM BROMIDE 0.02 % IN SOLN
0.5000 mg | Freq: Three times a day (TID) | RESPIRATORY_TRACT | Status: DC
  Administered 2022-12-13: 0.5 mg via RESPIRATORY_TRACT
  Filled 2022-12-12: qty 2.5

## 2022-12-12 MED ORDER — ACETYLCYSTEINE 20 % IN SOLN
4.0000 mL | Freq: Three times a day (TID) | RESPIRATORY_TRACT | Status: DC
  Administered 2022-12-13: 4 mL via RESPIRATORY_TRACT
  Filled 2022-12-12 (×2): qty 4

## 2022-12-12 MED ORDER — AZITHROMYCIN 250 MG PO TABS
500.0000 mg | ORAL_TABLET | Freq: Every day | ORAL | Status: DC
  Administered 2022-12-12 – 2022-12-14 (×3): 500 mg via ORAL
  Filled 2022-12-12 (×3): qty 2

## 2022-12-12 MED ORDER — LEVALBUTEROL HCL 0.63 MG/3ML IN NEBU
0.6300 mg | INHALATION_SOLUTION | Freq: Three times a day (TID) | RESPIRATORY_TRACT | Status: DC
  Administered 2022-12-13: 0.63 mg via RESPIRATORY_TRACT
  Filled 2022-12-12: qty 3

## 2022-12-12 MED ORDER — FUROSEMIDE 10 MG/ML IJ SOLN
20.0000 mg | Freq: Once | INTRAMUSCULAR | Status: AC
  Administered 2022-12-12: 20 mg via INTRAVENOUS
  Filled 2022-12-12: qty 2

## 2022-12-12 NOTE — Progress Notes (Addendum)
Physical Therapy Treatment Patient Details [name] MRN: [id number] DOB: [birth year] Today's Date: 12/12/2022   History of Present Illness Pt is a 89 y.o. female presenting from PCP with low SpO2 and SBP, cough, and congestion on 12/09/2022. DG chest with worsending pneumonia superimposed on ILD.  PMH significant for grade 2 diastolic heart failure with preserved EF, near syncope, frequent PVC, bradycardia, HTN, hypothyroidism, recurrent UTI, DOE, nephrolithiasis, ILD.    PT Comments   Pt admitted with above diagnosis.  Pt currently with functional limitations due to the deficits listed below (see PT Problem List). Pt on [facility] when PT arrived and nurse tech assisting pt with toileting task. Pt agreeable to therapy. Husband present. Pt required mod A for sit to stand  from [facility]. Pt required min A x 2 for safety with gait tasks in room 18 feet with RW and cues for posture, proper distance from RW, obstacle navigation, and encouragement with frequent standing rest breaks and pt reporting she could not. Pt encouraged to sit up in recliner for lunch, MD arrived, husband remained and all needs in place. Pt d/c plan remains appropriate at this time with continued skilled therapy services < 3 hr/day. Nurse is aware of pt request for pain medication. Pt will benefit from acute skilled PT to increase their independence and safety with mobility to allow discharge.      If plan is discharge home, recommend the following: A lot of help with walking and/or transfers;A lot of help with bathing/dressing/bathroom;Assistance with cooking/housework;Assist for transportation;Help with stairs or ramp for entrance;Supervision due to cognitive status;Direct supervision/assist for financial management;Direct supervision/assist for medications management   Can travel by private vehicle        Equipment Recommendations  None recommended by PT    Recommendations for Other Services       Precautions / Restrictions  Precautions Precautions: Fall Restrictions Weight Bearing Restrictions: No     Mobility  Bed Mobility               General bed mobility comments: pt seated on BSC when PT entered room, Nurse Tech assisting pt    Transfers Overall transfer level: Needs assistance Equipment used: Rolling walker (2 wheels) Transfers: Sit to/from Stand Sit to Stand: Mod assist           General transfer comment: cues for encouragement, posture and use of RW for stability wtih hand placement    Ambulation/Gait Ambulation/Gait assistance: +2 physical assistance, +2 safety/equipment, Min assist Gait Distance (Feet): 18 Feet Assistive device: Rolling walker (2 wheels) Gait Pattern/deviations: Step-to pattern, Shuffle, Trunk flexed, Narrow base of support Gait velocity: decreased     General Gait Details: encouragement for gait with frequent standing rest breaks, pt required cues for pursed lip breathing, posture,  safety and direction wtih RW min A x 2 for balance,  RW management and line/leads   Stairs             Wheelchair Mobility     Tilt Bed    Modified Rankin (Stroke Patients Only)       Balance Overall balance assessment: Needs assistance Sitting-balance support: Feet supported Sitting balance-Leahy Scale: Fair     Standing balance support: Bilateral upper extremity supported, During functional activity, Reliant on assistive device for balance Standing balance-Leahy Scale: Poor                              Cognition Arousal: Alert  Behavior During Therapy: WFL for tasks assessed/performed Overall Cognitive Status: Impaired/Different from baseline Area of Impairment: Memory, Following commands, Safety/judgement, Awareness, Problem solving, Attention                   Current Attention Level: Sustained Memory: Decreased recall of precautions Following Commands: Follows one step commands consistently, Follows one step commands with  increased time (intermittently needing cues secondary to hard of hearing and poor problem solving) Safety/Judgement: Decreased awareness of deficits, Decreased awareness of safety Awareness: Emergent Problem Solving: Slow processing, Difficulty sequencing, Requires verbal cues, Requires tactile cues          Exercises      General Comments General comments (skin integrity, edema, etc.): 2 L/min during tx session and maintained >/94%      Pertinent Vitals/Pain Pain Assessment Pain Assessment: Faces Faces Pain Scale: Hurts even more Pain Location: chest and ribs attributed to frequent coughing, nurse aware Pain Descriptors / Indicators: Aching, Sore, Grimacing, Crying Pain Intervention(s): Limited activity within patient's tolerance, Monitored during session, Repositioned, Patient requesting pain meds-RN notified    Home Living                          Prior Function            PT Goals (current goals can now be found in the care plan section) Acute Rehab PT Goals Patient Stated Goal: to get energy back PT Goal Formulation: With patient Time For Goal Achievement: 12/24/22 Potential to Achieve Goals: Fair Progress towards PT goals: Progressing toward goals (slow progression due to medical complexity and cognition)    Frequency    Min 1X/week      PT Plan      Co-evaluation              AM-PAC PT "6 Clicks" Mobility   Outcome Measure  Help needed turning from your back to your side while in a flat bed without using bedrails?: A Little Help needed moving from lying on your back to sitting on the side of a flat bed without using bedrails?: A Little Help needed moving to and from a bed to a chair (including a wheelchair)?: A Little Help needed standing up from a chair using your arms (e.g., wheelchair or bedside chair)?: A Little Help needed to walk in hospital room?: A Little Help needed climbing 3-5 steps with a railing? : Total 6 Click Score:  16    End of Session Equipment Utilized During Treatment: Gait belt;Oxygen Activity Tolerance: Patient limited by fatigue;Patient limited by pain Patient left: in bed;with call bell/phone within reach;with nursing/sitter in room;with family/visitor present;with bed alarm set Nurse Communication: Mobility status PT Visit Diagnosis: Unsteadiness on feet (R26.81);Other abnormalities of gait and mobility (R26.89);Muscle weakness (generalized) (M62.81);Difficulty in walking, not elsewhere classified (R26.2);Pain Pain - Right/Left:  (chest and ribs/flank)     Time: 1610-9604 PT Time Calculation (min) (ACUTE ONLY): 16 min  Charges:    $Gait Training: 8-22 mins PT General Charges $$ ACUTE PT VISIT: 1 Visit                     Johnny Bridge, PT Acute Rehab    Jacqualyn Posey 12/12/2022, 12:47 PM

## 2022-12-12 NOTE — Progress Notes (Signed)
PROGRESS NOTE    [name]  QQV:[id number] DOB: 19-Sep-1921 DOA: 12/09/2022 PCP: Shireen Quan, DO   Brief Narrative: 89 year old with past medical history significant for grade 2 diastolic heart failure, preserved ejection fraction, frequent PVC and bradycardia, hypertension, hypothyroidism and recurrent UTI, chronic dyspnea, hypothyroidism, nephrolithiasis, interstitial lung disease presents complaining of cough, congestion, decreased appetite for 1 week.  Evaluation in the ED she was noted to be tachypneic oxygen saturation 92 on room air, chest x-ray increase bilateral interstitial and patchy opacity which could represent pulmonary edema or multifocal pneumonia.  Patient was admitted for multifocal pneumonia,  the same night of admission developed acute hypoxic respiratory failure and was placed on 5 L of oxygen.     Assessment & Plan:   Principal Problem:   Multifocal pneumonia Active Problems:   Chronic diastolic CHF (congestive heart failure) (HCC)   AKI (acute kidney injury) (HCC)   Hyponatremia   Interstitial lung disease (HCC)   Hypothyroidism   Reactive airway disease   Acute CHF (congestive heart failure) (HCC)   GAD (generalized anxiety disorder)   Age-related physical debility   1-Acute Hypoxic Respiratory failure; in setting of acute Multifocal  PNA, also component aspiration PNA>  -Continue with nebulizer, Mucomyst, Xopenex.  -Continue with Guaifenesin.  -Discussed with Husband and son at bedside, patient would not want to be resuscitated or be place on Life support. DNR, DNI.  -she is down to 3 L oxygen.  -Respiratory panel negative, COVID RSV negative WBC increasing, change IV antibiotics to Vancomycin and Zosyn. 11/28, resume Azithromycin.  Tussionex seems to help with chest pain.  Plan to continue with IV antibiotics.   Acute Metabolic Encephalopathy, in setting of PNA , hypoxia and or medications. Received a dose of ativan.  Avoid Ativan.    Acute on  Chronic diastolic heart failure: Severe Aortic stenosis and moderate mitral stenosis.  Plan to start IV lasix, BP increasing. Received a dose today.  Elevation of troponin, cardiology consulted. Likely in setting HF and aortic stenosis.  No candidate for any other cardiac intervention.  IV lasix 20 mg IV today, then can do PRN  Daily weight   AKI: Prerenal received  IV fluids.  Hyponatremia: Monitor on fluids  Interstitial lung disease: Continue with nebulizer  Hypothyroidism: Resume Synthroid.   History of PVC and bradycardia:  Resume low dose metoprolol/   Generalized anxiety disorder: resume Zoloft Resume home dose xanax, low dose  Advanced age and debility : PT OT consulted Hypokalemia; replaced  Estimated body mass index is 24.37 kg/m as calculated from the following:   Height as of this encounter: 5\' 4"  (1.626 m).   Weight as of this encounter: 64.4 kg.   DVT prophylaxis: Lovenox Code Status: DNR, discussed with husband 11/29 Family Communication: Disposition Plan:  Status is: Inpatient Remains inpatient appropriate because: management of PNA    Consultants:  None  Procedures:  None  Antimicrobials:  Ceftriaxone, azithro  Subjective: She is sitting recliner, report chest pain when she takes deep breath.  Not feeling good.  Coughing less.   Objective: Vitals:   12/12/22 0921 12/12/22 1211 12/12/22 1311 12/12/22 1510  BP: (!) 113/50   (!) 107/43  Pulse: 80   80  Resp: 13 20 18 19   Temp: 98 F (36.7 C)     TempSrc: Oral     SpO2: 95%   93%  Weight:      Height:        Intake/Output Summary (Last 24 hours)  at 12/12/2022 1538 Last data filed at 12/12/2022 0934 Gross per 24 hour  Intake 830 ml  Output 1500 ml  Net -670 ml   Filed Weights   12/10/22 0500 12/11/22 0424 12/12/22 0500  Weight: 66.1 kg 66.2 kg 64.4 kg    Examination:  General exam: NAD Respiratory system: BL [place] Cardiovascular system: S 1, S 2 RRR Gastrointestinal  system: BS present, soft, nt Central nervous system: Alert.      Data Reviewed: I have personally reviewed following labs and imaging studies  CBC: Recent Labs  Lab 12/09/22 1521 12/10/22 0424 12/11/22 0433 12/12/22 0417  WBC 18.0* 21.1* 22.1* 24.2*  HGB 10.1* 10.2* 9.8* 10.1*  HCT 30.0* 31.2* 29.2* 30.6*  MCV 95.2 99.4 96.7 98.7  PLT 361 377 391 369   Basic Metabolic Panel: Recent Labs  Lab 12/09/22 1521 12/10/22 0424 12/11/22 0433 12/12/22 0417  NA 133* 133* 132* 134*  K 3.7 3.4* 3.5 3.5  CL 98 99 100 98  CO2 24 22 22 23   GLUCOSE 128* 178* 130* 110*  BUN 36* 33* 21 18  CREATININE 1.09* 0.78 0.61 0.62  CALCIUM 9.8 8.8* 8.6* 8.5*   GFR: Estimated Creatinine Clearance: 33.1 mL/min (by C-G formula based on SCr of 0.62 mg/dL). Liver Function Tests: Recent Labs  Lab 12/10/22 0424  AST 28  ALT 32  ALKPHOS 114  BILITOT 1.2*  PROT 7.1  ALBUMIN 2.6*   No results for input(s): "LIPASE", "AMYLASE" in the last 168 hours. No results for input(s): "AMMONIA" in the last 168 hours. Coagulation Profile: No results for input(s): "INR", "PROTIME" in the last 168 hours. Cardiac Enzymes: No results for input(s): "CKTOTAL", "CKMB", "CKMBINDEX", "TROPONINI" in the last 168 hours. BNP (last 3 results) No results for input(s): "PROBNP" in the last 8760 hours. HbA1C: No results for input(s): "HGBA1C" in the last 72 hours. CBG: Recent Labs  Lab 12/10/22 0251  GLUCAP 160*   Lipid Profile: No results for input(s): "CHOL", "HDL", "LDLCALC", "TRIG", "CHOLHDL", "LDLDIRECT" in the last 72 hours. Thyroid Function Tests: No results for input(s): "TSH", "T4TOTAL", "FREET4", "T3FREE", "THYROIDAB" in the last 72 hours. Anemia Panel: No results for input(s): "VITAMINB12", "FOLATE", "FERRITIN", "TIBC", "IRON", "RETICCTPCT" in the last 72 hours. Sepsis Labs: Recent Labs  Lab 12/09/22 1738 12/10/22 0424  PROCALCITON  --  0.70  LATICACIDVEN 0.8  --     Recent Results (from the  past 240 hour(s))  Resp panel by RT-PCR (RSV, Flu A&B, Covid) Anterior Nasal Swab     Status: None   Collection Time: 12/09/22  2:53 PM   Specimen: Anterior Nasal Swab  Result Value Ref Range Status   SARS Coronavirus 2 by RT PCR NEGATIVE NEGATIVE Final    Comment: (NOTE) SARS-CoV-2 target nucleic acids are NOT DETECTED.  The SARS-CoV-2 RNA is generally detectable in upper respiratory specimens during the acute phase of infection. The lowest concentration of SARS-CoV-2 viral copies this assay can detect is 138 copies/mL. A negative result does not preclude SARS-Cov-2 infection and should not be used as the sole basis for treatment or other patient management decisions. A negative result may occur with  improper specimen collection/handling, submission of specimen other than nasopharyngeal swab, presence of viral mutation(s) within the areas targeted by this assay, and inadequate number of viral copies(<138 copies/mL). A negative result must be combined with clinical observations, patient history, and epidemiological information. The expected result is Negative.  Fact Sheet for Patients:  BloggerCourse.com  Fact Sheet for Healthcare Providers:  SeriousBroker.it  This test is no t yet approved or cleared by the [place] and  has been authorized for detection and/or diagnosis of SARS-CoV-2 by FDA under an Emergency Use Authorization (EUA). This EUA will remain  in effect (meaning this test can be used) for the duration of the COVID-19 declaration under Section 564(b)(1) of the Act, 21 U.S.C.section 360bbb-3(b)(1), unless the authorization is terminated  or revoked sooner.       Influenza A by PCR NEGATIVE NEGATIVE Final   Influenza B by PCR NEGATIVE NEGATIVE Final    Comment: (NOTE) The Xpert Xpress SARS-CoV-2/FLU/RSV plus assay is intended as an aid in the diagnosis of influenza from Nasopharyngeal swab specimens  and should not be used as a sole basis for treatment. Nasal washings and aspirates are unacceptable for Xpert Xpress SARS-CoV-2/FLU/RSV testing.  Fact Sheet for Patients: BloggerCourse.com  Fact Sheet for Healthcare Providers: SeriousBroker.it  This test is not yet approved or cleared by the [place] FDA and has been authorized for detection and/or diagnosis of SARS-CoV-2 by FDA under an Emergency Use Authorization (EUA). This EUA will remain in effect (meaning this test can be used) for the duration of the COVID-19 declaration under Section 564(b)(1) of the Act, 21 U.S.C. section 360bbb-3(b)(1), unless the authorization is terminated or revoked.     Resp Syncytial Virus by PCR NEGATIVE NEGATIVE Final    Comment: (NOTE) Fact Sheet for Patients: BloggerCourse.com  Fact Sheet for Healthcare Providers: SeriousBroker.it  This test is not yet approved or cleared by the [place] FDA and has been authorized for detection and/or diagnosis of SARS-CoV-2 by FDA under an Emergency Use Authorization (EUA). This EUA will remain in effect (meaning this test can be used) for the duration of the COVID-19 declaration under Section 564(b)(1) of the Act, 21 U.S.C. section 360bbb-3(b)(1), unless the authorization is terminated or revoked.  Performed at [facility], [address], [place], [place] [address]   Blood culture (routine x 2)     Status: None (Preliminary result)   Collection Time: 12/09/22  5:38 PM   Specimen: BLOOD LEFT FOREARM  Result Value Ref Range Status   Specimen Description   Final    BLOOD LEFT FOREARM Performed at [facility], [address] [place], [place] [address]    Special Requests   Final    BOTTLES DRAWN AEROBIC AND ANAEROBIC Blood Culture adequate volume Performed at [facility], [address]  [place], [place] [address]    Culture   Final    NO GROWTH 3 DAYS Performed at [facility], [address] [place], [place] [address]    Report Status PENDING  Incomplete  Blood culture (routine x 2)     Status: None (Preliminary result)   Collection Time: 12/09/22  5:39 PM   Specimen: BLOOD RIGHT FOREARM  Result Value Ref Range Status   Specimen Description   Final    BLOOD RIGHT FOREARM Performed at [facility], [address] [place], [place] [address]    Special Requests   Final    BOTTLES DRAWN AEROBIC AND ANAEROBIC Blood Culture adequate volume Performed at [facility], [address] [place], [place] [address]    Culture   Final    NO GROWTH 3 DAYS Performed at [facility], [address] [place], [place] [address]    Report Status PENDING  Incomplete  Respiratory (~20 pathogens) panel by PCR  Status: None   Collection Time: 12/10/22  2:30 PM   Specimen: Nasopharyngeal Swab; Respiratory  Result Value Ref Range Status   Adenovirus NOT DETECTED NOT DETECTED Final   Coronavirus 229E NOT DETECTED NOT DETECTED Final    Comment: (NOTE) The Coronavirus on the Respiratory Panel, DOES NOT test for the novel  Coronavirus (2019 nCoV)    Coronavirus HKU1 NOT DETECTED NOT DETECTED Final   Coronavirus NL63 NOT DETECTED NOT DETECTED Final   Coronavirus OC43 NOT DETECTED NOT DETECTED Final   Metapneumovirus NOT DETECTED NOT DETECTED Final   Rhinovirus / Enterovirus NOT DETECTED NOT DETECTED Final   Influenza A NOT DETECTED NOT DETECTED Final   Influenza B NOT DETECTED NOT DETECTED Final   Parainfluenza Virus 1 NOT DETECTED NOT DETECTED Final   Parainfluenza Virus 2 NOT DETECTED NOT DETECTED Final   Parainfluenza Virus 3 NOT DETECTED NOT DETECTED Final   Parainfluenza Virus 4 NOT DETECTED NOT DETECTED Final   Respiratory Syncytial Virus NOT DETECTED NOT DETECTED Final   Bordetella pertussis NOT DETECTED NOT DETECTED Final   Bordetella  Parapertussis NOT DETECTED NOT DETECTED Final   Chlamydophila pneumoniae NOT DETECTED NOT DETECTED Final   Mycoplasma pneumoniae NOT DETECTED NOT DETECTED Final    Comment: Performed at [facility], [address], [place], [place] [address]  Expectorated Sputum Assessment w Gram Stain, Rflx to Resp Cult     Status: None   Collection Time: 12/10/22  2:53 PM   Specimen: Expectorated Sputum  Result Value Ref Range Status   Specimen Description EXPECTORATED SPUTUM  Final   Special Requests Normal  Final   Sputum evaluation   Final    THIS SPECIMEN IS ACCEPTABLE FOR SPUTUM CULTURE Performed at [facility], [address], [place], [place] [address]    Report Status 12/10/2022 FINAL  Final  Culture, Respiratory w Gram Stain     Status: None (Preliminary result)   Collection Time: 12/10/22  2:53 PM  Result Value Ref Range Status   Specimen Description   Final    EXPECTORATED SPUTUM Performed at [facility], [address], [place], [place] [address]    Special Requests   Final    Normal Reflexed from [phone] Performed at [facility], [address], [place], [place] [address]    Gram Stain   Final    FEW WBC PRESENT, PREDOMINANTLY PMN RARE GRAM POSITIVE COCCI    Culture   Final    CULTURE REINCUBATED FOR BETTER GROWTH Performed at [facility], [address] [place], [place] [address]    Report Status PENDING  Incomplete  MRSA Next Gen by PCR, Nasal     Status: None   Collection Time: 12/12/22 11:06 AM   Specimen: Nasal Mucosa; Nasal Swab  Result Value Ref Range Status   MRSA by PCR Next Gen NOT DETECTED NOT DETECTED Final    Comment: (NOTE) The GeneXpert MRSA Assay (FDA approved for NASAL specimens only), is one component of a comprehensive MRSA colonization surveillance program. It is not intended to diagnose MRSA infection nor to guide or monitor treatment for MRSA infections. Test performance is not  FDA approved in patients less than 47 years old. Performed at [facility], [address], [place], [place] [address]          Radiology Studies: ECHOCARDIOGRAM COMPLETE  Result Date: 12/11/2022    ECHOCARDIOGRAM REPORT   Patient Name:   Thao R Maresh  Date of Exam: 12/11/2022 Medical Rec #:  [id number]   Height:       64.0 in Accession #:    [id number]  Weight:       145.9 lb Date of Birth:  [birth year]   BSA:          1.711 m Patient Age:    89 years    BP:           137/55 mmHg Patient Gender: F           HR:           109 bpm. Exam Location:  Inpatient Procedure: 2D Echo, Cardiac Doppler and Color Doppler Indications:    I50.40* Unspecified combined systolic (congestive) and diastolic                 (congestive) heart failure  History:        Patient has prior history of Echocardiogram examinations, most                 recent 10/05/2015. CHF, Abnormal ECG, Arrythmias:Bradycardia,                 Signs/Symptoms:Shortness of Breath and Dyspnea; Risk                 Factors:Dyslipidemia and Hypertension.  Sonographer:    Sheralyn Boatman RDCS Referring Phys: [id number] [place]BR[place] SUNDIL IMPRESSIONS  1. Left ventricular ejection fraction, by estimation, is 60 to 65%. The left ventricle has normal function. The left ventricle has no regional wall motion abnormalities. There is mild left ventricular hypertrophy. Indeterminate diastolic filling due to E-A fusion. Elevated left ventricular end-diastolic pressure.  2. Right ventricular systolic function is normal. The right ventricular size is normal. There is normal pulmonary artery systolic pressure.  3. The mitral valve is abnormal. Trivial mitral valve regurgitation. Moderate mitral stenosis. The mean mitral valve gradient is 8.3 mmHg in the setting of tachycardia. Severe mitral annular calcification.  4. The aortic valve is calcified. There is severe calcifcation of the aortic valve. There is severe thickening of the aortic valve. Aortic valve  regurgitation is not visualized. Severe aortic valve stenosis. Aortic valve area, by VTI measures 0.74 cm. Aortic valve mean gradient measures 38.0 mmHg. Aortic valve Vmax measures 4.10 m/s. DVI is 0.27.  5. The inferior vena cava is normal in size with greater than 50% respiratory variability, suggesting right atrial pressure of 3 mmHg. Comparison(s): No prior Echocardiogram. Severe aortic stenosis and moderate mitral stenosis. FINDINGS  Left Ventricle: Left ventricular ejection fraction, by estimation, is 60 to 65%. The left ventricle has normal function. The left ventricle has no regional wall motion abnormalities. The left ventricular internal cavity size was normal in size. There is  mild left ventricular hypertrophy. Indeterminate diastolic filling due to E-A fusion. Elevated left ventricular end-diastolic pressure. Right Ventricle: The right ventricular size is normal. No increase in right ventricular wall thickness. Right ventricular systolic function is normal. There is normal pulmonary artery systolic pressure. The tricuspid regurgitant velocity is 2.33 m/s, and  with an assumed right atrial pressure of 3 mmHg, the estimated right ventricular systolic pressure is 24.7 mmHg. Left Atrium: Left atrial size was normal in size. Right Atrium: Right atrial size was normal in size. Pericardium: There is no evidence of pericardial effusion. Mitral Valve: The mitral valve is abnormal. Severe mitral annular calcification. Trivial mitral valve regurgitation. Moderate mitral valve stenosis. MV peak gradient, 15.6 mmHg. The mean mitral valve gradient is 8.3 mmHg.  Tricuspid Valve: The tricuspid valve is normal in structure. Tricuspid valve regurgitation is mild . No evidence of tricuspid stenosis. Aortic Valve: The aortic valve is calcified. There is severe calcifcation of the aortic valve. There is severe thickening of the aortic valve. Aortic valve regurgitation is not visualized. Severe aortic stenosis is present.  Aortic valve mean gradient measures 38.0 mmHg. Aortic valve peak gradient measures 67.1 mmHg. Aortic valve area, by VTI measures 0.74 cm. Pulmonic Valve: The pulmonic valve was normal in structure. Pulmonic valve regurgitation is mild. No evidence of pulmonic stenosis. Aorta: The aortic root and ascending aorta are structurally normal, with no evidence of dilitation. Venous: The inferior vena cava is normal in size with greater than 50% respiratory variability, suggesting right atrial pressure of 3 mmHg. IAS/Shunts: No atrial level shunt detected by color flow Doppler.  LEFT VENTRICLE PLAX 2D LVIDd:         3.35 cm     Diastology LVIDs:         2.50 cm     LV e' medial:    11.40 cm/s LV PW:         1.55 cm     LV E/e' medial:  15.1 LV IVS:        1.10 cm     LV e' lateral:   4.13 cm/s LVOT diam:     2.15 cm     LV E/e' lateral: 41.6 LV SV:         58 LV SV Index:   34 LVOT Area:     3.63 cm  LV Volumes (MOD) LV vol d, MOD A2C: 69.7 ml LV vol d, MOD A4C: 51.6 ml LV vol s, MOD A2C: 26.7 ml LV vol s, MOD A4C: 19.4 ml LV SV MOD A2C:     43.0 ml LV SV MOD A4C:     51.6 ml LV SV MOD BP:      36.6 ml RIGHT VENTRICLE            IVC RV S prime:     9.68 cm/s  IVC diam: 1.80 cm TAPSE (M-mode): 1.9 cm LEFT ATRIUM             Index        RIGHT ATRIUM           Index LA diam:        3.40 cm 1.99 cm/m   RA Area:     10.80 cm LA Vol (A2C):   41.4 ml 24.19 ml/m  RA Volume:   19.50 ml  11.40 ml/m LA Vol (A4C):   25.1 ml 14.67 ml/m LA Biplane Vol: 35.5 ml 20.75 ml/m  AORTIC VALVE                     PULMONIC VALVE AV Area (Vmax):    0.98 cm      PR End Diast Vel: 1.51 msec AV Area (Vmean):   0.94 cm AV Area (VTI):     0.74 cm AV Vmax:           409.50 cm/s AV Vmean:          281.000 cm/s AV VTI:            0.783 m AV Peak Grad:      67.1 mmHg AV Mean Grad:      38.0 mmHg LVOT Vmax:         110.00 cm/s LVOT Vmean:  72.700 cm/s LVOT VTI:          0.160 m LVOT/AV VTI ratio: 0.20  AORTA Ao Root diam: 3.20 cm Ao Asc  diam:  3.00 cm MITRAL VALVE                TRICUSPID VALVE MV Area (PHT): 4.39 cm     TR Peak grad:   21.7 mmHg MV Area VTI:   1.95 cm     TR Vmax:        233.00 cm/s MV Peak grad:  15.6 mmHg MV Mean grad:  8.3 mmHg     SHUNTS MV Vmax:       1.97 m/s     Systemic VTI:  0.16 m MV Vmean:      132.3 cm/s   Systemic Diam: 2.15 cm MV Decel Time: 173 msec MV E velocity: 172.00 cm/s Vishnu Priya Mallipeddi Electronically signed by Winfield Rast Mallipeddi Signature Date/Time: 12/11/2022/12:43:31 PM    Final    DG Chest Port 1 View  Result Date: 12/11/2022 CLINICAL DATA:  [id number], [id number]. Chest pain and shortness of breath, fever. EXAM: PORTABLE CHEST 1 VIEW COMPARISON:  Portable chest yesterday at 3:37 a.m. FINDINGS: 2:38 a.m. There is mild cardiomegaly. Central vascular prominence and mild interstitial edema continue to be seen with small pleural effusions. There are increased bilateral perihilar opacities which could be alveolar edema or pneumonia, with persistent patchy airspace disease in the right upper lobe which probably is due to pneumonia. Findings are superimposed on chronic interstitial lung disease. The mediastinum is stable. There is aortic atherosclerosis.  No new osseous findings. Thoracic spondylosis.  Chronic right humeral ORIF. IMPRESSION: 1. Increased bilateral perihilar opacities which could be alveolar edema or pneumonia, with persistent patchy airspace disease in the right upper lobe which probably is due to pneumonia. 2. Findings are superimposed on chronic interstitial lung disease. 3. Cardiomegaly with central vascular prominence and mild interstitial edema. 4. Small pleural effusions. 5. Aortic atherosclerosis. Electronically Signed   By: Almira Bar M.D.   On: 12/11/2022 04:30        Scheduled Meds:  acetylcysteine  4 mL Nebulization Q6H   azithromycin  500 mg Oral Daily   enoxaparin (LOVENOX) injection  40 mg Subcutaneous Daily   guaiFENesin  1,200 mg Oral BID   ipratropium   0.5 mg Nebulization Q6H   levalbuterol  0.63 mg Nebulization Q6H   levothyroxine  75 mcg Oral Q0600   metoprolol tartrate  12.5 mg Oral BID   pantoprazole (PROTONIX) IV  40 mg Intravenous Q12H   sertraline  100 mg Oral Daily   sodium chloride flush  3 mL Intravenous Q12H   Continuous Infusions:  piperacillin-tazobactam (ZOSYN)  IV Stopped (12/12/22 1358)   vancomycin Stopped (12/12/22 1040)     LOS: 3 days    Time spent: 35 minutes    Antiono Ettinger A Lashawnta Burgert, MD [facility]   If 7PM-7AM, please contact night-coverage [email or web address]  12/12/2022, 3:38 PM

## 2022-12-12 NOTE — TOC Progression Note (Signed)
Transition of Care [facility]) - Progression Note    Patient Details  Name: MEHJABEEN TORRIS MRN: [id number] Date of Birth: 03/02/1921  Transition of Care [facility]) CM/SW Contact  Amada Jupiter, LCSW Phone Number: 12/12/2022, 4:06 PM  Clinical Narrative:    [facility] liaison, Burnetta Sabin, alerted this CSW today that they can accept pt into the skilled care level at [facility] (no bed available at [facility]) and that pt's son is aware.  Per MD, pt not medically ready for dc yet.  If cleared over weekend, [facility] team will need to call the "supervisor on call" at [phone] to alert that pt will be coming.  Will pass info to weekend [facility] team.   Expected Discharge Plan:  (TBD) Barriers to Discharge: Continued Medical Work up  Expected Discharge Plan and Services                                               Social Determinants of Health (SDOH) Interventions SDOH Screenings   Food Insecurity: No Food Insecurity (12/10/2022)  Housing: Low Risk  (12/10/2022)  Transportation Needs: No Transportation Needs (12/10/2022)  Utilities: Not At Risk (12/10/2022)  Tobacco Use: Low Risk  (12/10/2022)    Readmission Risk Interventions     No data to display

## 2022-12-12 NOTE — Plan of Care (Signed)

## 2022-12-12 NOTE — Progress Notes (Signed)
Rounding Note    Patient [name] Date of Encounter: 12/12/2022  [facility] Cardiologist: Kristeen Miss, MD   Subjective   Mild dyspnea; CP increased with cough  Inpatient Medications    Scheduled Meds:  acetylcysteine  4 mL Nebulization Q6H   enoxaparin (LOVENOX) injection  40 mg Subcutaneous Daily   guaiFENesin  1,200 mg Oral BID   ipratropium  0.5 mg Nebulization Q6H   levalbuterol  0.63 mg Nebulization Q6H   levothyroxine  75 mcg Oral Q0600   metoprolol tartrate  12.5 mg Oral BID   pantoprazole (PROTONIX) IV  40 mg Intravenous Q12H   sertraline  100 mg Oral Daily   sodium chloride flush  3 mL Intravenous Q12H   Continuous Infusions:  piperacillin-tazobactam (ZOSYN)  IV 3.375 g (12/12/22 0140)   vancomycin     PRN Meds: acetaminophen **OR** acetaminophen, ALPRAZolam, chlorpheniramine-HYDROcodone, ipratropium, melatonin, nitroGLYCERIN, ondansetron **OR** ondansetron (ZOFRAN) IV, mouth rinse, senna-docusate, sodium chloride flush   Vital Signs    Vitals:   12/11/22 2049 12/12/22 0034 12/12/22 0500 12/12/22 0508  BP:  (!) 121/46  (!) 119/46  Pulse:  91  78  Resp:  20  19  Temp:  98.3 F (36.8 C)  98.2 F (36.8 C)  TempSrc:  Oral  Oral  SpO2: 99% 96%  97%  Weight:   64.4 kg   Height:        Intake/Output Summary (Last 24 hours) at 12/12/2022 0731 Last data filed at 12/12/2022 0514 Gross per 24 hour  Intake 1150 ml  Output 1900 ml  Net -750 ml      12/12/2022    5:00 AM 12/11/2022    4:24 AM 12/10/2022    5:00 AM  Last 3 Weights  Weight (lbs) 141 lb 15.6 oz 145 lb 15.1 oz 145 lb 11.6 oz  Weight (kg) 64.4 kg 66.2 kg 66.1 kg      Telemetry    Sinus with nonconducted PAC- Personally Reviewed    Physical Exam   GEN: No acute distress.   Neck: supple Cardiac: RRR, 2/6 systolic murmur Respiratory: basilar crackles GI: Soft, nontender, non-distended  MS: No edema Neuro:  Nonfocal  Psych: Normal affect   Labs    High  Sensitivity Troponin:   Recent Labs  Lab 12/11/22 0433 12/11/22 0628  TROPONINIHS 225* 238*     Chemistry Recent Labs  Lab 12/10/22 0424 12/11/22 0433 12/12/22 0417  NA 133* 132* 134*  K 3.4* 3.5 3.5  CL 99 100 98  CO2 22 22 23   GLUCOSE 178* 130* 110*  BUN 33* 21 18  CREATININE 0.78 0.61 0.62  CALCIUM 8.8* 8.6* 8.5*  PROT 7.1  --   --   ALBUMIN 2.6*  --   --   AST 28  --   --   ALT 32  --   --   ALKPHOS 114  --   --   BILITOT 1.2*  --   --   GFRNONAA >60 >60 >60  ANIONGAP 12 10 13      Hematology Recent Labs  Lab 12/10/22 0424 12/11/22 0433 12/12/22 0417  WBC 21.1* 22.1* 24.2*  RBC 3.14* 3.02* 3.10*  HGB 10.2* 9.8* 10.1*  HCT 31.2* 29.2* 30.6*  MCV 99.4 96.7 98.7  MCH 32.5 32.5 32.6  MCHC 32.7 33.6 33.0  RDW 14.1 14.2 14.4  PLT 377 391 369    BNP Recent Labs  Lab 12/09/22 1521 12/11/22 0628  BNP 380.2* 766.2*  Radiology    ECHOCARDIOGRAM COMPLETE  Result Date: 12/11/2022    ECHOCARDIOGRAM REPORT   Patient Name:   [name] Date of Exam: 12/11/2022 Medical Rec #:  [id number]   Height:       64.0 in Accession #:    [id number]  Weight:       145.9 lb Date of Birth:  10-Oct-1921   BSA:          1.711 m Patient Age:    89 years    BP:           137/55 mmHg Patient Gender: F           HR:           109 bpm. Exam Location:  Inpatient Procedure: 2D Echo, Cardiac Doppler and Color Doppler Indications:    I50.40* Unspecified combined systolic (congestive) and diastolic                 (congestive) heart failure  History:        Patient has prior history of Echocardiogram examinations, most                 recent 10/05/2015. CHF, Abnormal ECG, Arrythmias:Bradycardia,                 Signs/Symptoms:Shortness of Breath and Dyspnea; Risk                 Factors:Dyslipidemia and Hypertension.  Sonographer:    Sheralyn Boatman RDCS Referring Phys: [id number] SUBRINA SUNDIL IMPRESSIONS  1. Left ventricular ejection fraction, by estimation, is 60 to 65%. The left ventricle has  normal function. The left ventricle has no regional wall motion abnormalities. There is mild left ventricular hypertrophy. Indeterminate diastolic filling due to E-A fusion. Elevated left ventricular end-diastolic pressure.  2. Right ventricular systolic function is normal. The right ventricular size is normal. There is normal pulmonary artery systolic pressure.  3. The mitral valve is abnormal. Trivial mitral valve regurgitation. Moderate mitral stenosis. The mean mitral valve gradient is 8.3 mmHg in the setting of tachycardia. Severe mitral annular calcification.  4. The aortic valve is calcified. There is severe calcifcation of the aortic valve. There is severe thickening of the aortic valve. Aortic valve regurgitation is not visualized. Severe aortic valve stenosis. Aortic valve area, by VTI measures 0.74 cm. Aortic valve mean gradient measures 38.0 mmHg. Aortic valve Vmax measures 4.10 m/s. DVI is 0.27.  5. The inferior vena cava is normal in size with greater than 50% respiratory variability, suggesting right atrial pressure of 3 mmHg. Comparison(s): No prior Echocardiogram. Severe aortic stenosis and moderate mitral stenosis. FINDINGS  Left Ventricle: Left ventricular ejection fraction, by estimation, is 60 to 65%. The left ventricle has normal function. The left ventricle has no regional wall motion abnormalities. The left ventricular internal cavity size was normal in size. There is  mild left ventricular hypertrophy. Indeterminate diastolic filling due to E-A fusion. Elevated left ventricular end-diastolic pressure. Right Ventricle: The right ventricular size is normal. No increase in right ventricular wall thickness. Right ventricular systolic function is normal. There is normal pulmonary artery systolic pressure. The tricuspid regurgitant velocity is 2.33 m/s, and  with an assumed right atrial pressure of 3 mmHg, the estimated right ventricular systolic pressure is 24.7 mmHg. Left Atrium: Left atrial  size was normal in size. Right Atrium: Right atrial size was normal in size. Pericardium: There is no evidence of pericardial effusion. Mitral Valve: The mitral valve is abnormal.  Severe mitral annular calcification. Trivial mitral valve regurgitation. Moderate mitral valve stenosis. MV peak gradient, 15.6 mmHg. The mean mitral valve gradient is 8.3 mmHg. Tricuspid Valve: The tricuspid valve is normal in structure. Tricuspid valve regurgitation is mild . No evidence of tricuspid stenosis. Aortic Valve: The aortic valve is calcified. There is severe calcifcation of the aortic valve. There is severe thickening of the aortic valve. Aortic valve regurgitation is not visualized. Severe aortic stenosis is present. Aortic valve mean gradient measures 38.0 mmHg. Aortic valve peak gradient measures 67.1 mmHg. Aortic valve area, by VTI measures 0.74 cm. Pulmonic Valve: The pulmonic valve was normal in structure. Pulmonic valve regurgitation is mild. No evidence of pulmonic stenosis. Aorta: The aortic root and ascending aorta are structurally normal, with no evidence of dilitation. Venous: The inferior vena cava is normal in size with greater than 50% respiratory variability, suggesting right atrial pressure of 3 mmHg. IAS/Shunts: No atrial level shunt detected by color flow Doppler.  LEFT VENTRICLE PLAX 2D LVIDd:         3.35 cm     Diastology LVIDs:         2.50 cm     LV e' medial:    11.40 cm/s LV PW:         1.55 cm     LV E/e' medial:  15.1 LV IVS:        1.10 cm     LV e' lateral:   4.13 cm/s LVOT diam:     2.15 cm     LV E/e' lateral: 41.6 LV SV:         58 LV SV Index:   34 LVOT Area:     3.63 cm  LV Volumes (MOD) LV vol d, MOD A2C: 69.7 ml LV vol d, MOD A4C: 51.6 ml LV vol s, MOD A2C: 26.7 ml LV vol s, MOD A4C: 19.4 ml LV SV MOD A2C:     43.0 ml LV SV MOD A4C:     51.6 ml LV SV MOD BP:      36.6 ml RIGHT VENTRICLE            IVC RV S prime:     9.68 cm/s  IVC diam: 1.80 cm TAPSE (M-mode): 1.9 cm LEFT ATRIUM              Index        RIGHT ATRIUM           Index LA diam:        3.40 cm 1.99 cm/m   RA Area:     10.80 cm LA Vol (A2C):   41.4 ml 24.19 ml/m  RA Volume:   19.50 ml  11.40 ml/m LA Vol (A4C):   25.1 ml 14.67 ml/m LA Biplane Vol: 35.5 ml 20.75 ml/m  AORTIC VALVE                     PULMONIC VALVE AV Area (Vmax):    0.98 cm      PR End Diast Vel: 1.51 msec AV Area (Vmean):   0.94 cm AV Area (VTI):     0.74 cm AV Vmax:           409.50 cm/s AV Vmean:          281.000 cm/s AV VTI:            0.783 m AV Peak Grad:      67.1 mmHg AV Mean Grad:  38.0 mmHg LVOT Vmax:         110.00 cm/s LVOT Vmean:        72.700 cm/s LVOT VTI:          0.160 m LVOT/AV VTI ratio: 0.20  AORTA Ao Root diam: 3.20 cm Ao Asc diam:  3.00 cm MITRAL VALVE                TRICUSPID VALVE MV Area (PHT): 4.39 cm     TR Peak grad:   21.7 mmHg MV Area VTI:   1.95 cm     TR Vmax:        233.00 cm/s MV Peak grad:  15.6 mmHg MV Mean grad:  8.3 mmHg     SHUNTS MV Vmax:       1.97 m/s     Systemic VTI:  0.16 m MV Vmean:      132.3 cm/s   Systemic Diam: 2.15 cm MV Decel Time: 173 msec MV E velocity: 172.00 cm/s Vishnu Priya Mallipeddi Electronically signed by Winfield Rast Mallipeddi Signature Date/Time: 12/11/2022/12:43:31 PM    Final    DG Chest Port 1 View  Result Date: 12/11/2022 CLINICAL DATA:  [id number], [id number]. Chest pain and shortness of breath, fever. EXAM: PORTABLE CHEST 1 VIEW COMPARISON:  Portable chest yesterday at 3:37 a.m. FINDINGS: 2:38 a.m. There is mild cardiomegaly. Central vascular prominence and mild interstitial edema continue to be seen with small pleural effusions. There are increased bilateral perihilar opacities which could be alveolar edema or pneumonia, with persistent patchy airspace disease in the right upper lobe which probably is due to pneumonia. Findings are superimposed on chronic interstitial lung disease. The mediastinum is stable. There is aortic atherosclerosis.  No new osseous findings. Thoracic spondylosis.   Chronic right humeral ORIF. IMPRESSION: 1. Increased bilateral perihilar opacities which could be alveolar edema or pneumonia, with persistent patchy airspace disease in the right upper lobe which probably is due to pneumonia. 2. Findings are superimposed on chronic interstitial lung disease. 3. Cardiomegaly with central vascular prominence and mild interstitial edema. 4. Small pleural effusions. 5. Aortic atherosclerosis. Electronically Signed   By: Almira Bar M.D.   On: 12/11/2022 04:30     Patient Profile     89 y.o. female with past medical history of heart failure preserved ejection fraction, hypertension, interstitial lung disease, aortic stenosis admitted with pneumonia being evaluated for elevated troponin.  Assessment & Plan   1 elevated troponin-minimal elevation in troponin.  I do not think this is consistent with acute coronary syndrome.  She is not a candidate for aggressive cardiac evaluation given age and comorbidities.  2 pneumonia-antibiotics per primary service.  3 acute diastolic congestive heart failure-volume status difficult to assess but BNP mildly elevated.  Will give Lasix 20 mg IV x 1.  4 Severe aortic stenosis/moderate mitral stenosis-not a candidate for aggressive interventions.  5 interstitial lung disease  6 no CODE BLUE  Other issues per primary service.  Cardiology will sign off.  Please call with questions.  For questions or updates, please contact  [facility] Please consult [email or web address] for contact info under        Signed, Olga Millers, MD  12/12/2022, 7:31 AM

## 2022-12-13 DIAGNOSIS — R7989 Other specified abnormal findings of blood chemistry: Secondary | ICD-10-CM | POA: Diagnosis not present

## 2022-12-13 LAB — CBC
HCT: 30 % — ABNORMAL LOW (ref 36.0–46.0)
Hemoglobin: 9.7 g/dL — ABNORMAL LOW (ref 12.0–15.0)
MCH: 32.2 pg (ref 26.0–34.0)
MCHC: 32.3 g/dL (ref 30.0–36.0)
MCV: 99.7 fL (ref 80.0–100.0)
Platelets: 441 10*3/uL — ABNORMAL HIGH (ref 150–400)
RBC: 3.01 MIL/uL — ABNORMAL LOW (ref 3.87–5.11)
RDW: 14.2 % (ref 11.5–15.5)
WBC: 22.8 10*3/uL — ABNORMAL HIGH (ref 4.0–10.5)
nRBC: 0 % (ref 0.0–0.2)

## 2022-12-13 LAB — CREATININE, SERUM
Creatinine, Ser: 0.76 mg/dL (ref 0.44–1.00)
GFR, Estimated: >60 mL/min (ref >60)

## 2022-12-13 LAB — BASIC METABOLIC PANEL
Anion gap: 11 (ref 5–15)
BUN: 22 mg/dL (ref 8–23)
CO2: 24 mmol/L (ref 22–32)
Calcium: 8.4 mg/dL — ABNORMAL LOW (ref 8.9–10.3)
Chloride: 97 mmol/L — ABNORMAL LOW (ref 98–111)
Creatinine, Ser: 0.79 mg/dL (ref 0.44–1.00)
GFR, Estimated: >60 mL/min (ref >60)
Glucose, Bld: 103 mg/dL — ABNORMAL HIGH (ref 70–99)
Potassium: 3.1 mmol/L — ABNORMAL LOW (ref 3.5–5.1)
Sodium: 132 mmol/L — ABNORMAL LOW (ref 135–145)

## 2022-12-13 MED ORDER — ACETAMINOPHEN 500 MG PO TABS
500.0000 mg | ORAL_TABLET | Freq: Three times a day (TID) | ORAL | Status: DC
  Administered 2022-12-13 – 2022-12-15 (×7): 500 mg via ORAL
  Filled 2022-12-13 (×7): qty 1

## 2022-12-13 MED ORDER — BUDESONIDE 0.25 MG/2ML IN SUSP
0.2500 mg | Freq: Two times a day (BID) | RESPIRATORY_TRACT | Status: DC
  Administered 2022-12-13 – 2022-12-15 (×5): 0.25 mg via RESPIRATORY_TRACT
  Filled 2022-12-13 (×5): qty 2

## 2022-12-13 MED ORDER — PANTOPRAZOLE SODIUM 40 MG PO TBEC
40.0000 mg | DELAYED_RELEASE_TABLET | Freq: Two times a day (BID) | ORAL | Status: DC
  Administered 2022-12-13 – 2022-12-15 (×4): 40 mg via ORAL
  Filled 2022-12-13 (×4): qty 1

## 2022-12-13 MED ORDER — IPRATROPIUM BROMIDE 0.02 % IN SOLN
0.5000 mg | Freq: Two times a day (BID) | RESPIRATORY_TRACT | Status: DC
  Administered 2022-12-13 – 2022-12-15 (×4): 0.5 mg via RESPIRATORY_TRACT
  Filled 2022-12-13 (×4): qty 2.5

## 2022-12-13 MED ORDER — POTASSIUM CHLORIDE CRYS ER 20 MEQ PO TBCR
40.0000 meq | EXTENDED_RELEASE_TABLET | Freq: Once | ORAL | Status: AC
  Administered 2022-12-13: 40 meq via ORAL
  Filled 2022-12-13: qty 2

## 2022-12-13 MED ORDER — FUROSEMIDE 20 MG PO TABS
10.0000 mg | ORAL_TABLET | Freq: Every day | ORAL | Status: DC
  Administered 2022-12-13 – 2022-12-14 (×2): 10 mg via ORAL
  Filled 2022-12-13 (×2): qty 1

## 2022-12-13 MED ORDER — ARFORMOTEROL TARTRATE 15 MCG/2ML IN NEBU
15.0000 ug | INHALATION_SOLUTION | Freq: Two times a day (BID) | RESPIRATORY_TRACT | Status: DC
  Administered 2022-12-13 – 2022-12-15 (×5): 15 ug via RESPIRATORY_TRACT
  Filled 2022-12-13 (×5): qty 2

## 2022-12-13 MED ORDER — LEVALBUTEROL HCL 0.63 MG/3ML IN NEBU
0.6300 mg | INHALATION_SOLUTION | Freq: Two times a day (BID) | RESPIRATORY_TRACT | Status: DC
  Administered 2022-12-13 – 2022-12-15 (×4): 0.63 mg via RESPIRATORY_TRACT
  Filled 2022-12-13 (×4): qty 3

## 2022-12-13 NOTE — Progress Notes (Signed)
PROGRESS NOTE    [name]  ZOX:[id number] DOB: 04-08-1921 DOA: 12/09/2022 PCP: Shireen Quan, DO   Brief Narrative: 89 year old with past medical history significant for grade 2 diastolic heart failure, preserved ejection fraction, frequent PVC and bradycardia, hypertension, hypothyroidism and recurrent UTI, chronic dyspnea, hypothyroidism, nephrolithiasis, interstitial lung disease presents complaining of cough, congestion, decreased appetite for 1 week.  Evaluation in the ED she was noted to be tachypneic oxygen saturation 92 on room air, chest x-ray increase bilateral interstitial and patchy opacity which could represent pulmonary edema or multifocal pneumonia.  Patient was admitted for multifocal pneumonia,  the same night of admission developed acute hypoxic respiratory failure and was placed on 5 L of oxygen.     Assessment & Plan:   Principal Problem:   Multifocal pneumonia Active Problems:   Chronic diastolic CHF (congestive heart failure) (HCC)   AKI (acute kidney injury) (HCC)   Hyponatremia   Interstitial lung disease (HCC)   Hypothyroidism   Reactive airway disease   Acute CHF (congestive heart failure) (HCC)   GAD (generalized anxiety disorder)   Age-related physical debility   1-Acute Hypoxic Respiratory failure; in setting of acute Multifocal  PNA, also component aspiration PNA>  -Continue with nebulizer, , Xopenex.  -Continue with Guaifenesin.  -Discussed with Husband and son at bedside, patient would not want to be resuscitated or be place on Life support. DNR, DNI.  -Stable on 3 L oxygen.  -Respiratory panel negative, COVID RSV negative WBC increasing, change IV antibiotics to Vancomycin and Zosyn. 11/28, resume Azithromycin.  Tussionex seems to help with chest pain.  Plan to continue with IV antibiotics.  Will add Pulmicort, brovana   Acute Metabolic Encephalopathy, in setting of PNA , hypoxia and or medications. Received a dose of ativan.  Avoid Ativan.   She is more alert.   Acute on Chronic diastolic heart failure: Severe Aortic stenosis and moderate mitral stenosis.  Plan to start IV lasix, BP increasing. Received a dose today.  Elevation of troponin, cardiology consulted. Likely in setting HF and aortic stenosis.  No candidate for any other cardiac intervention.  IV lasix 20 mg IV 11/29., will order low dose lasix  Daily weight   AKI: Prerenal received  IV fluids.  Hyponatremia: Monitor on fluids  Interstitial lung disease: Continue with nebulizer  Hypothyroidism: Resume Synthroid.   History of PVC and bradycardia:  Resume low dose metoprolol/   Generalized anxiety disorder: resume Zoloft Resume home dose xanax, low dose  Advanced age and debility : PT OT consulted Hypokalemia; replaced  Estimated body mass index is 24.33 kg/m as calculated from the following:   Height as of this encounter: 5\' 4"  (1.626 m).   Weight as of this encounter: 64.3 kg.   DVT prophylaxis: Lovenox Code Status: DNR, discussed with husband 11/29 Family Communication: Disposition Plan:  Status is: Inpatient Remains inpatient appropriate because: management of PNA    Consultants:  None  Procedures:  None  Antimicrobials:  Ceftriaxone, azithro  Subjective: She is alert, she doesn't feel well yet. Chest congestion, cough.   Objective: Vitals:   12/13/22 0406 12/13/22 0500 12/13/22 0955 12/13/22 1138  BP: (!) 113/44  (!) 117/55   Pulse: 77     Resp: 20  17 17   Temp: 98.1 F (36.7 C)     TempSrc: Oral     SpO2: 98%     Weight:  64.3 kg    Height:        Intake/Output Summary (Last 24  hours) at 12/13/2022 1327 Last data filed at 12/13/2022 0427 Gross per 24 hour  Intake 785.65 ml  Output --  Net 785.65 ml   Filed Weights   12/11/22 0424 12/12/22 0500 12/13/22 0500  Weight: 66.2 kg 64.4 kg 64.3 kg    Examination:  General exam: NAD Respiratory system: BL ronchus.  Cardiovascular system: S 1, S 2  RRR Gastrointestinal system:BS present, soft, nt Central nervous system: Alert.      Data Reviewed: I have personally reviewed following labs and imaging studies  CBC: Recent Labs  Lab 12/09/22 1521 12/10/22 0424 12/11/22 0433 12/12/22 0417 12/13/22 0432  WBC 18.0* 21.1* 22.1* 24.2* 22.8*  HGB 10.1* 10.2* 9.8* 10.1* 9.7*  HCT 30.0* 31.2* 29.2* 30.6* 30.0*  MCV 95.2 99.4 96.7 98.7 99.7  PLT 361 377 391 369 441*   Basic Metabolic Panel: Recent Labs  Lab 12/09/22 1521 12/10/22 0424 12/11/22 0433 12/12/22 0417 12/13/22 0432  NA 133* 133* 132* 134* 132*  K 3.7 3.4* 3.5 3.5 3.1*  CL 98 99 100 98 97*  CO2 24 22 22 23 24   GLUCOSE 128* 178* 130* 110* 103*  BUN 36* 33* 21 18 22   CREATININE 1.09* 0.78 0.61 0.62 0.79  0.76  CALCIUM 9.8 8.8* 8.6* 8.5* 8.4*   GFR: Estimated Creatinine Clearance: 33.1 mL/min (by C-G formula based on SCr of 0.76 mg/dL). Liver Function Tests: Recent Labs  Lab 12/10/22 0424  AST 28  ALT 32  ALKPHOS 114  BILITOT 1.2*  PROT 7.1  ALBUMIN 2.6*   No results for input(s): "LIPASE", "AMYLASE" in the last 168 hours. No results for input(s): "AMMONIA" in the last 168 hours. Coagulation Profile: No results for input(s): "INR", "PROTIME" in the last 168 hours. Cardiac Enzymes: No results for input(s): "CKTOTAL", "CKMB", "CKMBINDEX", "TROPONINI" in the last 168 hours. BNP (last 3 results) No results for input(s): "PROBNP" in the last 8760 hours. HbA1C: No results for input(s): "HGBA1C" in the last 72 hours. CBG: Recent Labs  Lab 12/10/22 0251  GLUCAP 160*   Lipid Profile: No results for input(s): "CHOL", "HDL", "LDLCALC", "TRIG", "CHOLHDL", "LDLDIRECT" in the last 72 hours. Thyroid Function Tests: No results for input(s): "TSH", "T4TOTAL", "FREET4", "T3FREE", "THYROIDAB" in the last 72 hours. Anemia Panel: No results for input(s): "VITAMINB12", "FOLATE", "FERRITIN", "TIBC", "IRON", "RETICCTPCT" in the last 72 hours. Sepsis Labs: Recent  Labs  Lab 12/09/22 1738 12/10/22 0424  PROCALCITON  --  0.70  LATICACIDVEN 0.8  --     Recent Results (from the past 240 hour(s))  Resp panel by RT-PCR (RSV, Flu A&B, Covid) Anterior Nasal Swab     Status: None   Collection Time: 12/09/22  2:53 PM   Specimen: Anterior Nasal Swab  Result Value Ref Range Status   SARS Coronavirus 2 by RT PCR NEGATIVE NEGATIVE Final    Comment: (NOTE) SARS-CoV-2 target nucleic acids are NOT DETECTED.  The SARS-CoV-2 RNA is generally detectable in upper respiratory specimens during the acute phase of infection. The lowest concentration of SARS-CoV-2 viral copies this assay can detect is 138 copies/mL. A negative result does not preclude SARS-Cov-2 infection and should not be used as the sole basis for treatment or other patient management decisions. A negative result may occur with  improper specimen collection/handling, submission of specimen other than nasopharyngeal swab, presence of viral mutation(s) within the areas targeted by this assay, and inadequate number of viral copies(<138 copies/mL). A negative result must be combined with clinical observations, patient history, and epidemiological information.  The expected result is Negative.  Fact Sheet for Patients:  BloggerCourse.com  Fact Sheet for Healthcare Providers:  SeriousBroker.it  This test is no t yet approved or cleared by the [place] FDA and  has been authorized for detection and/or diagnosis of SARS-CoV-2 by FDA under an Emergency Use Authorization (EUA). This EUA will remain  in effect (meaning this test can be used) for the duration of the COVID-19 declaration under Section 564(b)(1) of the Act, 21 U.S.C.section 360bbb-3(b)(1), unless the authorization is terminated  or revoked sooner.       Influenza A by PCR NEGATIVE NEGATIVE Final   Influenza B by PCR NEGATIVE NEGATIVE Final    Comment: (NOTE) The Xpert Xpress  SARS-CoV-2/FLU/RSV plus assay is intended as an aid in the diagnosis of influenza from Nasopharyngeal swab specimens and should not be used as a sole basis for treatment. Nasal washings and aspirates are unacceptable for Xpert Xpress SARS-CoV-2/FLU/RSV testing.  Fact Sheet for Patients: BloggerCourse.com  Fact Sheet for Healthcare Providers: SeriousBroker.it  This test is not yet approved or cleared by the [place] FDA and has been authorized for detection and/or diagnosis of SARS-CoV-2 by FDA under an Emergency Use Authorization (EUA). This EUA will remain in effect (meaning this test can be used) for the duration of the COVID-19 declaration under Section 564(b)(1) of the Act, 21 U.S.C. section 360bbb-3(b)(1), unless the authorization is terminated or revoked.     Resp Syncytial Virus by PCR NEGATIVE NEGATIVE Final    Comment: (NOTE) Fact Sheet for Patients: BloggerCourse.com  Fact Sheet for Healthcare Providers: SeriousBroker.it  This test is not yet approved or cleared by the [place] FDA and has been authorized for detection and/or diagnosis of SARS-CoV-2 by FDA under an Emergency Use Authorization (EUA). This EUA will remain in effect (meaning this test can be used) for the duration of the COVID-19 declaration under Section 564(b)(1) of the Act, 21 U.S.C. section 360bbb-3(b)(1), unless the authorization is terminated or revoked.  Performed at [facility], [address], [place], [place] [address]   Blood culture (routine x 2)     Status: None (Preliminary result)   Collection Time: 12/09/22  5:38 PM   Specimen: BLOOD LEFT FOREARM  Result Value Ref Range Status   Specimen Description   Final    BLOOD LEFT FOREARM Performed at [facility], [address], [place], [place] [address]    Special Requests   Final    BOTTLES DRAWN  AEROBIC AND ANAEROBIC Blood Culture adequate volume Performed at [facility], [address] [place], [place] [address]    Culture   Final    NO GROWTH 4 DAYS Performed at [facility], [address] [place], [place] [address]    Report Status PENDING  Incomplete  Blood culture (routine x 2)     Status: None (Preliminary result)   Collection Time: 12/09/22  5:39 PM   Specimen: BLOOD RIGHT FOREARM  Result Value Ref Range Status   Specimen Description   Final    BLOOD RIGHT FOREARM Performed at [facility], [address], [place], [place] [address]    Special Requests   Final    BOTTLES DRAWN AEROBIC AND ANAEROBIC Blood Culture adequate volume Performed at [facility], [address], [place], [place] [address]    Culture   Final    NO GROWTH 4 DAYS Performed at [facility], [address] [place], [place]  [address]    Report Status PENDING  Incomplete  Respiratory (~20 pathogens) panel by PCR     Status: None   Collection Time: 12/10/22  2:30 PM   Specimen: Nasopharyngeal Swab; Respiratory  Result Value Ref Range Status   Adenovirus NOT DETECTED NOT DETECTED Final   Coronavirus 229E NOT DETECTED NOT DETECTED Final    Comment: (NOTE) The Coronavirus on the Respiratory Panel, DOES NOT test for the novel  Coronavirus (2019 nCoV)    Coronavirus HKU1 NOT DETECTED NOT DETECTED Final   Coronavirus NL63 NOT DETECTED NOT DETECTED Final   Coronavirus OC43 NOT DETECTED NOT DETECTED Final   Metapneumovirus NOT DETECTED NOT DETECTED Final   Rhinovirus / Enterovirus NOT DETECTED NOT DETECTED Final   Influenza A NOT DETECTED NOT DETECTED Final   Influenza B NOT DETECTED NOT DETECTED Final   Parainfluenza Virus 1 NOT DETECTED NOT DETECTED Final   Parainfluenza Virus 2 NOT DETECTED NOT DETECTED Final   Parainfluenza Virus 3 NOT DETECTED NOT DETECTED Final   Parainfluenza Virus 4 NOT DETECTED NOT DETECTED Final    Respiratory Syncytial Virus NOT DETECTED NOT DETECTED Final   Bordetella pertussis NOT DETECTED NOT DETECTED Final   Bordetella Parapertussis NOT DETECTED NOT DETECTED Final   Chlamydophila pneumoniae NOT DETECTED NOT DETECTED Final   Mycoplasma pneumoniae NOT DETECTED NOT DETECTED Final    Comment: Performed at [facility], [address] [place], [place] [address]  Expectorated Sputum Assessment w Gram Stain, Rflx to Resp Cult     Status: None   Collection Time: 12/10/22  2:53 PM   Specimen: Expectorated Sputum  Result Value Ref Range Status   Specimen Description EXPECTORATED SPUTUM  Final   Special Requests Normal  Final   Sputum evaluation   Final    THIS SPECIMEN IS ACCEPTABLE FOR SPUTUM CULTURE Performed at [facility], [address] [place], [place] [address]    Report Status 12/10/2022 FINAL  Final  Culture, Respiratory w Gram Stain     Status: None (Preliminary result)   Collection Time: 12/10/22  2:53 PM  Result Value Ref Range Status   Specimen Description   Final    EXPECTORATED SPUTUM Performed at [facility], [address], [place], [place] [address]    Special Requests   Final    Normal Reflexed from [phone] Performed at [facility], [address], [place], [place] [address]    Gram Stain   Final    FEW WBC PRESENT, PREDOMINANTLY PMN RARE GRAM POSITIVE COCCI    Culture   Final    CULTURE REINCUBATED FOR BETTER GROWTH Performed at [facility], [address], [place], [place] [address]    Report Status PENDING  Incomplete  MRSA Next Gen by PCR, Nasal     Status: None   Collection Time: 12/12/22 11:06 AM   Specimen: Nasal Mucosa; Nasal Swab  Result Value Ref Range Status   MRSA by PCR Next Gen NOT DETECTED NOT DETECTED Final    Comment: (NOTE) The GeneXpert MRSA Assay (FDA approved for NASAL specimens only), is one component of a comprehensive MRSA colonization  surveillance program. It is not intended to diagnose MRSA infection nor to guide or monitor treatment for MRSA infections. Test performance is not FDA approved in patients less than 28 years old. Performed at [facility], [address], [place], [place] [address]          Radiology Studies:  No results found.      Scheduled Meds:  acetaminophen  500 mg Oral TID   arformoterol  15 mcg Nebulization BID   azithromycin  500 mg Oral Daily   budesonide (PULMICORT) nebulizer solution  0.25 mg Nebulization BID   enoxaparin (LOVENOX) injection  40 mg Subcutaneous Daily   furosemide  10 mg Oral Daily   guaiFENesin  1,200 mg Oral BID   ipratropium  0.5 mg Nebulization BID   levalbuterol  0.63 mg Nebulization BID   levothyroxine  75 mcg Oral Q0600   metoprolol tartrate  12.5 mg Oral BID   pantoprazole  40 mg Oral BID   sertraline  100 mg Oral Daily   sodium chloride flush  3 mL Intravenous Q12H   Continuous Infusions:  piperacillin-tazobactam (ZOSYN)  IV 3.375 g (12/13/22 0955)     LOS: 4 days    Time spent: 35 minutes    Demaris Bousquet A Glennis Borger, MD [facility]   If 7PM-7AM, please contact night-coverage [email or web address]  12/13/2022, 1:27 PM

## 2022-12-13 NOTE — Plan of Care (Signed)

## 2022-12-14 DIAGNOSIS — R7989 Other specified abnormal findings of blood chemistry: Secondary | ICD-10-CM | POA: Diagnosis not present

## 2022-12-14 LAB — CULTURE, BLOOD (ROUTINE X 2)
Culture: NO GROWTH
Culture: NO GROWTH
Special Requests: ADEQUATE
Special Requests: ADEQUATE

## 2022-12-14 LAB — BASIC METABOLIC PANEL
Anion gap: 10 (ref 5–15)
BUN: 16 mg/dL (ref 8–23)
CO2: 27 mmol/L (ref 22–32)
Calcium: 8.4 mg/dL — ABNORMAL LOW (ref 8.9–10.3)
Chloride: 98 mmol/L (ref 98–111)
Creatinine, Ser: 0.7 mg/dL (ref 0.44–1.00)
GFR, Estimated: >60 mL/min (ref >60)
Glucose, Bld: 101 mg/dL — ABNORMAL HIGH (ref 70–99)
Potassium: 3.3 mmol/L — ABNORMAL LOW (ref 3.5–5.1)
Sodium: 135 mmol/L (ref 135–145)

## 2022-12-14 LAB — CBC
HCT: 30.4 % — ABNORMAL LOW (ref 36.0–46.0)
Hemoglobin: 9.9 g/dL — ABNORMAL LOW (ref 12.0–15.0)
MCH: 31.8 pg (ref 26.0–34.0)
MCHC: 32.6 g/dL (ref 30.0–36.0)
MCV: 97.7 fL (ref 80.0–100.0)
Platelets: 463 10*3/uL — ABNORMAL HIGH (ref 150–400)
RBC: 3.11 MIL/uL — ABNORMAL LOW (ref 3.87–5.11)
RDW: 14.1 % (ref 11.5–15.5)
WBC: 22 10*3/uL — ABNORMAL HIGH (ref 4.0–10.5)
nRBC: 0 % (ref 0.0–0.2)

## 2022-12-14 LAB — CULTURE, RESPIRATORY W GRAM STAIN
Culture: NORMAL
Special Requests: NORMAL

## 2022-12-14 MED ORDER — POTASSIUM CHLORIDE CRYS ER 20 MEQ PO TBCR
20.0000 meq | EXTENDED_RELEASE_TABLET | Freq: Every day | ORAL | Status: DC
Start: 2022-12-14 — End: 2022-12-15
  Administered 2022-12-14 – 2022-12-15 (×2): 20 meq via ORAL
  Filled 2022-12-14 (×2): qty 1

## 2022-12-14 MED ORDER — POTASSIUM CHLORIDE CRYS ER 20 MEQ PO TBCR
20.0000 meq | EXTENDED_RELEASE_TABLET | Freq: Once | ORAL | Status: AC
  Administered 2022-12-14: 20 meq via ORAL
  Filled 2022-12-14: qty 1

## 2022-12-14 MED ORDER — FUROSEMIDE 20 MG PO TABS
20.0000 mg | ORAL_TABLET | Freq: Once | ORAL | Status: AC
  Administered 2022-12-14: 20 mg via ORAL
  Filled 2022-12-14: qty 1

## 2022-12-14 MED ORDER — FUROSEMIDE 20 MG PO TABS
20.0000 mg | ORAL_TABLET | Freq: Every day | ORAL | Status: DC
  Administered 2022-12-15: 20 mg via ORAL
  Filled 2022-12-14: qty 1

## 2022-12-14 NOTE — Progress Notes (Signed)
PHARMACY NOTE   Pharmacy has been assisting with dosing of Zosyn for multifocal pneumonia. Dosage remains stable at 3.375g IV q8h (each dose infused over 4 hours) and need for further dosage adjustment appears unlikely at present.    Will sign off at this time.  Please reconsult if a change in clinical status warrants re-evaluation of dosage.   Greer Pickerel, PharmD, BCPS Clinical Pharmacist 12/14/2022 12:47 PM

## 2022-12-14 NOTE — Progress Notes (Signed)
Resumed care of patient from [place], [place]. Patient currently resting in bed with no complaints or needs at this time. Pt requests to rest. Agree with previous RN assessment. Will continue to monitor.

## 2022-12-14 NOTE — Progress Notes (Signed)
PROGRESS NOTE    [name]  KGM:[id number] DOB: 1921/05/15 DOA: 12/09/2022 PCP: Shireen Quan, DO   Brief Narrative: 89 year old with past medical history significant for grade 2 diastolic heart failure, preserved ejection fraction, frequent PVC and bradycardia, hypertension, hypothyroidism and recurrent UTI, chronic dyspnea, hypothyroidism, nephrolithiasis, interstitial lung disease presents complaining of cough, congestion, decreased appetite for 1 week.  Evaluation in the ED she was noted to be tachypneic oxygen saturation 92 on room air, chest x-ray increase bilateral interstitial and patchy opacity which could represent pulmonary edema or multifocal pneumonia.  Patient was admitted for multifocal pneumonia,  the same night of admission developed acute hypoxic respiratory failure and was placed on 5 L of oxygen.     Assessment & Plan:   Principal Problem:   Multifocal pneumonia Active Problems:   Chronic diastolic CHF (congestive heart failure) (HCC)   AKI (acute kidney injury) (HCC)   Hyponatremia   Interstitial lung disease (HCC)   Hypothyroidism   Reactive airway disease   Acute CHF (congestive heart failure) (HCC)   GAD (generalized anxiety disorder)   Age-related physical debility   1-Acute Hypoxic Respiratory failure; in setting of acute Multifocal  PNA, also component aspiration PNA>  -Continue with nebulizer, , Xopenex.  -Continue with Guaifenesin.  -Discussed with Husband and son at bedside, patient would not want to be resuscitated or be place on Life support. DNR, DNI.  -Stable on 3 L oxygen.  -Respiratory panel negative, COVID RSV negative WBC increasing, change IV antibiotics to Vancomycin and Zosyn. 11/28, resume Azithromycin.  Tussionex seems to help with chest pain.  Plan to continue with IV antibiotics.  Continue with  Pulmicort, brovana  WBC sable at 22   Acute Metabolic Encephalopathy, in setting of PNA , hypoxia and or medications. Received a dose of  ativan.  Avoid Ativan.  She is more alert.   Acute on Chronic diastolic heart failure: Severe Aortic stenosis and moderate mitral stenosis.  Plan to start IV lasix, BP increasing. Received a dose today.  Elevation of troponin, cardiology consulted. Likely in setting HF and aortic stenosis.  No candidate for any other cardiac intervention.  IV lasix 20 mg IV 11/29.,  Daily weight Will increase lasix to 20 mg.   AKI: Prerenal received  IV fluids.  Hyponatremia: Monitor on fluids  Interstitial lung disease: Continue with nebulizer  Hypothyroidism: Resume Synthroid.   History of PVC and bradycardia:  Resume low dose metoprolol/   Generalized anxiety disorder: resume Zoloft Resume home dose xanax, low dose  Advanced age and debility : PT OT consulted Hypokalemia; replaced  Estimated body mass index is 25.01 kg/m as calculated from the following:   Height as of this encounter: 5\' 4"  (1.626 m).   Weight as of this encounter: 66.1 kg.   DVT prophylaxis: Lovenox Code Status: DNR, discussed with husband 11/30 Family Communication: Disposition Plan:  Status is: Inpatient Remains inpatient appropriate because: management of PNA    Consultants:  None  Procedures:  None  Antimicrobials:  Ceftriaxone, azithro  Subjective: Alert, denies worsening dyspnea.   Objective: Vitals:   12/14/22 0355 12/14/22 1000 12/14/22 1117 12/14/22 1206  BP: (!) 127/48 (!) 136/58  (!) 122/53  Pulse: 86 89  85  Resp: 19   14  Temp: 97.8 F (36.6 C)   98.1 F (36.7 C)  TempSrc: Oral   Oral  SpO2: 95%  95% 95%  Weight: 66.1 kg     Height:  Intake/Output Summary (Last 24 hours) at 12/14/2022 1404 Last data filed at 12/14/2022 0458 Gross per 24 hour  Intake 410 ml  Output 600 ml  Net -190 ml   Filed Weights   12/12/22 0500 12/13/22 0500 12/14/22 0355  Weight: 64.4 kg 64.3 kg 66.1 kg    Examination:  General exam: NAD Respiratory system:BL ronchus  Cardiovascular  system: S 1, S 2 RR Gastrointestinal system: BS present, soft, nt Central nervous system: Alert.      Data Reviewed: I have personally reviewed following labs and imaging studies  CBC: Recent Labs  Lab 12/10/22 0424 12/11/22 0433 12/12/22 0417 12/13/22 0432 12/14/22 0736  WBC 21.1* 22.1* 24.2* 22.8* 22.0*  HGB 10.2* 9.8* 10.1* 9.7* 9.9*  HCT 31.2* 29.2* 30.6* 30.0* 30.4*  MCV 99.4 96.7 98.7 99.7 97.7  PLT 377 391 369 441* 463*   Basic Metabolic Panel: Recent Labs  Lab 12/10/22 0424 12/11/22 0433 12/12/22 0417 12/13/22 0432 12/14/22 0736  NA 133* 132* 134* 132* 135  K 3.4* 3.5 3.5 3.1* 3.3*  CL 99 100 98 97* 98  CO2 22 22 23 24 27   GLUCOSE 178* 130* 110* 103* 101*  BUN 33* 21 18 22 16   CREATININE 0.78 0.61 0.62 0.79  0.76 0.70  CALCIUM 8.8* 8.6* 8.5* 8.4* 8.4*   GFR: Estimated Creatinine Clearance: 35.9 mL/min (by C-G formula based on SCr of 0.7 mg/dL). Liver Function Tests: Recent Labs  Lab 12/10/22 0424  AST 28  ALT 32  ALKPHOS 114  BILITOT 1.2*  PROT 7.1  ALBUMIN 2.6*   No results for input(s): "LIPASE", "AMYLASE" in the last 168 hours. No results for input(s): "AMMONIA" in the last 168 hours. Coagulation Profile: No results for input(s): "INR", "PROTIME" in the last 168 hours. Cardiac Enzymes: No results for input(s): "CKTOTAL", "CKMB", "CKMBINDEX", "TROPONINI" in the last 168 hours. BNP (last 3 results) No results for input(s): "PROBNP" in the last 8760 hours. HbA1C: No results for input(s): "HGBA1C" in the last 72 hours. CBG: Recent Labs  Lab 12/10/22 0251  GLUCAP 160*   Lipid Profile: No results for input(s): "CHOL", "HDL", "LDLCALC", "TRIG", "CHOLHDL", "LDLDIRECT" in the last 72 hours. Thyroid Function Tests: No results for input(s): "TSH", "T4TOTAL", "FREET4", "T3FREE", "THYROIDAB" in the last 72 hours. Anemia Panel: No results for input(s): "VITAMINB12", "FOLATE", "FERRITIN", "TIBC", "IRON", "RETICCTPCT" in the last 72 hours. Sepsis  Labs: Recent Labs  Lab 12/09/22 1738 12/10/22 0424  PROCALCITON  --  0.70  LATICACIDVEN 0.8  --     Recent Results (from the past 240 hour(s))  Resp panel by RT-PCR (RSV, Flu A&B, Covid) Anterior Nasal Swab     Status: None   Collection Time: 12/09/22  2:53 PM   Specimen: Anterior Nasal Swab  Result Value Ref Range Status   SARS Coronavirus 2 by RT PCR NEGATIVE NEGATIVE Final    Comment: (NOTE) SARS-CoV-2 target nucleic acids are NOT DETECTED.  The SARS-CoV-2 RNA is generally detectable in upper respiratory specimens during the acute phase of infection. The lowest concentration of SARS-CoV-2 viral copies this assay can detect is 138 copies/mL. A negative result does not preclude SARS-Cov-2 infection and should not be used as the sole basis for treatment or other patient management decisions. A negative result may occur with  improper specimen collection/handling, submission of specimen other than nasopharyngeal swab, presence of viral mutation(s) within the areas targeted by this assay, and inadequate number of viral copies(<138 copies/mL). A negative result must be combined with clinical observations,  patient history, and epidemiological information. The expected result is Negative.  Fact Sheet for Patients:  BloggerCourse.com  Fact Sheet for Healthcare Providers:  SeriousBroker.it  This test is no t yet approved or cleared by the [place] FDA and  has been authorized for detection and/or diagnosis of SARS-CoV-2 by FDA under an Emergency Use Authorization (EUA). This EUA will remain  in effect (meaning this test can be used) for the duration of the COVID-19 declaration under Section 564(b)(1) of the Act, 21 U.S.C.section 360bbb-3(b)(1), unless the authorization is terminated  or revoked sooner.       Influenza A by PCR NEGATIVE NEGATIVE Final   Influenza B by PCR NEGATIVE NEGATIVE Final    Comment: (NOTE) The  Xpert Xpress SARS-CoV-2/FLU/RSV plus assay is intended as an aid in the diagnosis of influenza from Nasopharyngeal swab specimens and should not be used as a sole basis for treatment. Nasal washings and aspirates are unacceptable for Xpert Xpress SARS-CoV-2/FLU/RSV testing.  Fact Sheet for Patients: BloggerCourse.com  Fact Sheet for Healthcare Providers: SeriousBroker.it  This test is not yet approved or cleared by the [place] FDA and has been authorized for detection and/or diagnosis of SARS-CoV-2 by FDA under an Emergency Use Authorization (EUA). This EUA will remain in effect (meaning this test can be used) for the duration of the COVID-19 declaration under Section 564(b)(1) of the Act, 21 U.S.C. section 360bbb-3(b)(1), unless the authorization is terminated or revoked.     Resp Syncytial Virus by PCR NEGATIVE NEGATIVE Final    Comment: (NOTE) Fact Sheet for Patients: BloggerCourse.com  Fact Sheet for Healthcare Providers: SeriousBroker.it  This test is not yet approved or cleared by the [place] FDA and has been authorized for detection and/or diagnosis of SARS-CoV-2 by FDA under an Emergency Use Authorization (EUA). This EUA will remain in effect (meaning this test can be used) for the duration of the COVID-19 declaration under Section 564(b)(1) of the Act, 21 U.S.C. section 360bbb-3(b)(1), unless the authorization is terminated or revoked.  Performed at [facility], [address], [place], [place] [address]   Blood culture (routine x 2)     Status: None   Collection Time: 12/09/22  5:38 PM   Specimen: BLOOD LEFT FOREARM  Result Value Ref Range Status   Specimen Description   Final    BLOOD LEFT FOREARM Performed at [facility], [address] [place], [place] [address]    Special Requests   Final    BOTTLES DRAWN AEROBIC AND  ANAEROBIC Blood Culture adequate volume Performed at [facility], [address], [place], [place] [address]    Culture   Final    NO GROWTH 5 DAYS Performed at [facility], [address] [place], [place] [address]    Report Status 12/14/2022 FINAL  Final  Blood culture (routine x 2)     Status: None   Collection Time: 12/09/22  5:39 PM   Specimen: BLOOD RIGHT FOREARM  Result Value Ref Range Status   Specimen Description   Final    BLOOD RIGHT FOREARM Performed at [facility], [address] [place], [place] [address]    Special Requests   Final    BOTTLES DRAWN AEROBIC AND ANAEROBIC Blood Culture adequate volume Performed at [facility], [address], [place], [place] [address]    Culture   Final    NO GROWTH 5 DAYS Performed at [facility], [address],  [place], [place] [address]    Report Status 12/14/2022 FINAL  Final  Respiratory (~20 pathogens) panel by PCR     Status: None   Collection Time: 12/10/22  2:30 PM   Specimen: Nasopharyngeal Swab; Respiratory  Result Value Ref Range Status   Adenovirus NOT DETECTED NOT DETECTED Final   Coronavirus 229E NOT DETECTED NOT DETECTED Final    Comment: (NOTE) The Coronavirus on the Respiratory Panel, DOES NOT test for the novel  Coronavirus (2019 nCoV)    Coronavirus HKU1 NOT DETECTED NOT DETECTED Final   Coronavirus NL63 NOT DETECTED NOT DETECTED Final   Coronavirus OC43 NOT DETECTED NOT DETECTED Final   Metapneumovirus NOT DETECTED NOT DETECTED Final   Rhinovirus / Enterovirus NOT DETECTED NOT DETECTED Final   Influenza A NOT DETECTED NOT DETECTED Final   Influenza B NOT DETECTED NOT DETECTED Final   Parainfluenza Virus 1 NOT DETECTED NOT DETECTED Final   Parainfluenza Virus 2 NOT DETECTED NOT DETECTED Final   Parainfluenza Virus 3 NOT DETECTED NOT DETECTED Final   Parainfluenza Virus 4 NOT DETECTED NOT DETECTED Final   Respiratory Syncytial Virus NOT  DETECTED NOT DETECTED Final   Bordetella pertussis NOT DETECTED NOT DETECTED Final   Bordetella Parapertussis NOT DETECTED NOT DETECTED Final   Chlamydophila pneumoniae NOT DETECTED NOT DETECTED Final   Mycoplasma pneumoniae NOT DETECTED NOT DETECTED Final    Comment: Performed at [facility], [address] [place], [place] [address]  Expectorated Sputum Assessment w Gram Stain, Rflx to Resp Cult     Status: None   Collection Time: 12/10/22  2:53 PM   Specimen: Expectorated Sputum  Result Value Ref Range Status   Specimen Description EXPECTORATED SPUTUM  Final   Special Requests Normal  Final   Sputum evaluation   Final    THIS SPECIMEN IS ACCEPTABLE FOR SPUTUM CULTURE Performed at [facility], [address], [place], [place] [address]    Report Status 12/10/2022 FINAL  Final  Culture, Respiratory w Gram Stain     Status: None   Collection Time: 12/10/22  2:53 PM  Result Value Ref Range Status   Specimen Description   Final    EXPECTORATED SPUTUM Performed at [facility], [address] [place], [place] [address]    Special Requests   Final    Normal Reflexed from [phone] Performed at [facility], [address] [place], [place] [address]    Gram Stain   Final    FEW WBC PRESENT, PREDOMINANTLY PMN RARE GRAM POSITIVE COCCI    Culture   Final    FEW Normal respiratory flora-no Staph aureus or Pseudomonas seen Performed at [facility], [address], [place], [place] [address]    Report Status 12/14/2022 FINAL  Final  MRSA Next Gen by PCR, Nasal     Status: None   Collection Time: 12/12/22 11:06 AM   Specimen: Nasal Mucosa; Nasal Swab  Result Value Ref Range Status   MRSA by PCR Next Gen NOT DETECTED NOT DETECTED Final    Comment: (NOTE) The GeneXpert MRSA Assay (FDA approved for NASAL specimens only), is one component of a comprehensive MRSA colonization surveillance program. It is not intended  to diagnose MRSA infection nor to guide or monitor treatment for MRSA infections. Test performance is not FDA approved in patients less than 82 years old. Performed at [facility], [address] [place], [place] [address]  Radiology Studies: No results found.      Scheduled Meds:  acetaminophen  500 mg Oral TID   arformoterol  15 mcg Nebulization BID   azithromycin  500 mg Oral Daily   budesonide (PULMICORT) nebulizer solution  0.25 mg Nebulization BID   enoxaparin (LOVENOX) injection  40 mg Subcutaneous Daily   furosemide  10 mg Oral Daily   guaiFENesin  1,200 mg Oral BID   ipratropium  0.5 mg Nebulization BID   levalbuterol  0.63 mg Nebulization BID   levothyroxine  75 mcg Oral Q0600   metoprolol tartrate  12.5 mg Oral BID   pantoprazole  40 mg Oral BID   potassium chloride  20 mEq Oral Daily   sertraline  100 mg Oral Daily   sodium chloride flush  3 mL Intravenous Q12H   Continuous Infusions:  piperacillin-tazobactam (ZOSYN)  IV 3.375 g (12/14/22 1222)     LOS: 5 days    Time spent: 35 minutes    Tieler Cournoyer A Leondra Cullin, MD [facility]   If 7PM-7AM, please contact night-coverage [email or web address]  12/14/2022, 2:04 PM

## 2022-12-15 DIAGNOSIS — J189 Pneumonia, unspecified organism: Secondary | ICD-10-CM | POA: Diagnosis not present

## 2022-12-15 LAB — CBC
HCT: 29 % — ABNORMAL LOW (ref 36.0–46.0)
Hemoglobin: 9.4 g/dL — ABNORMAL LOW (ref 12.0–15.0)
MCH: 32.4 pg (ref 26.0–34.0)
MCHC: 32.4 g/dL (ref 30.0–36.0)
MCV: 100 fL (ref 80.0–100.0)
Platelets: 462 10*3/uL — ABNORMAL HIGH (ref 150–400)
RBC: 2.9 MIL/uL — ABNORMAL LOW (ref 3.87–5.11)
RDW: 14.1 % (ref 11.5–15.5)
WBC: 18.2 10*3/uL — ABNORMAL HIGH (ref 4.0–10.5)
nRBC: 0 % (ref 0.0–0.2)

## 2022-12-15 LAB — BASIC METABOLIC PANEL
Anion gap: 10 (ref 5–15)
BUN: 16 mg/dL (ref 8–23)
CO2: 24 mmol/L (ref 22–32)
Calcium: 8.6 mg/dL — ABNORMAL LOW (ref 8.9–10.3)
Chloride: 101 mmol/L (ref 98–111)
Creatinine, Ser: 0.71 mg/dL (ref 0.44–1.00)
GFR, Estimated: >60 mL/min (ref >60)
Glucose, Bld: 99 mg/dL (ref 70–99)
Potassium: 3.6 mmol/L (ref 3.5–5.1)
Sodium: 135 mmol/L (ref 135–145)

## 2022-12-15 MED ORDER — POTASSIUM CHLORIDE CRYS ER 20 MEQ PO TBCR: 20.0000 meq | EXTENDED_RELEASE_TABLET | Freq: Every day | ORAL | 0 refills | Status: AC

## 2022-12-15 MED ORDER — ARFORMOTEROL TARTRATE 15 MCG/2ML IN NEBU: 15.0000 ug | INHALATION_SOLUTION | Freq: Two times a day (BID) | RESPIRATORY_TRACT | 0 refills | Status: AC

## 2022-12-15 MED ORDER — METOPROLOL TARTRATE 25 MG PO TABS: 12.5000 mg | ORAL_TABLET | Freq: Two times a day (BID) | ORAL | 0 refills | Status: AC

## 2022-12-15 MED ORDER — PANTOPRAZOLE SODIUM 40 MG PO TBEC: 40.0000 mg | DELAYED_RELEASE_TABLET | Freq: Two times a day (BID) | ORAL | 0 refills | Status: AC

## 2022-12-15 MED ORDER — ALBUTEROL SULFATE (2.5 MG/3ML) 0.083% IN NEBU: 2.5000 mg | INHALATION_SOLUTION | Freq: Four times a day (QID) | RESPIRATORY_TRACT | 3 refills | Status: DC | PRN

## 2022-12-15 MED ORDER — LEVOFLOXACIN 500 MG PO TABS
500.0000 mg | ORAL_TABLET | Freq: Every day | ORAL | Status: DC
  Administered 2022-12-15: 500 mg via ORAL
  Filled 2022-12-15: qty 1

## 2022-12-15 MED ORDER — GUAIFENESIN ER 600 MG PO TB12: 1200.0000 mg | ORAL_TABLET | Freq: Two times a day (BID) | ORAL | 0 refills | Status: AC

## 2022-12-15 MED ORDER — IPRATROPIUM BROMIDE 0.02 % IN SOLN: 0.5000 mg | Freq: Two times a day (BID) | RESPIRATORY_TRACT | 12 refills | Status: AC

## 2022-12-15 MED ORDER — FUROSEMIDE 20 MG PO TABS: 20.0000 mg | ORAL_TABLET | Freq: Every day | ORAL | 0 refills | Status: AC

## 2022-12-15 MED ORDER — LEVOFLOXACIN 500 MG PO TABS: 500.0000 mg | ORAL_TABLET | Freq: Every day | ORAL | 0 refills | Status: AC

## 2022-12-15 MED ORDER — BUDESONIDE 0.25 MG/2ML IN SUSP: 0.2500 mg | Freq: Two times a day (BID) | RESPIRATORY_TRACT | 12 refills | Status: AC

## 2022-12-15 MED ORDER — LEVALBUTEROL HCL 0.63 MG/3ML IN NEBU: 0.6300 mg | INHALATION_SOLUTION | Freq: Two times a day (BID) | RESPIRATORY_TRACT | 12 refills | Status: AC

## 2022-12-15 NOTE — TOC Initial Note (Signed)
Transition of Care [facility]) - Initial/Assessment Note    Patient Details  [name] MRN: [id number] Date of Birth: Feb 11, 1921  [facility] CM/SW Contact:    Larrie Kass, LCSW Phone Number: 12/15/2022, 11:06 AM  Clinical Narrative:                 Pt to d/c to [facility] room 15 call report to [phone] ext [phone] or [phone]. CSW spoke with pt's son [name] , he agrees with pt's d/c plan. Pt will need EMS transport [facility] called. No further TOC needs, TOC sign off.    Barriers to Discharge: No Barriers Identified   Patient Goals and CMS Choice   CMS Medicare.gov Compare Post Acute Care list provided to:: Patient Choice offered to / list presented to : Patient, Spouse, Adult Children      Expected Discharge Plan and Services         Expected Discharge Date: 12/15/22                                    Prior Living Arrangements/Services                       Activities of Daily Living   ADL Screening (condition at time of admission) Independently performs ADLs?: No Does the patient have a NEW difficulty with bathing/dressing/toileting/self-feeding that is expected to last >3 days?: No Does the patient have a NEW difficulty with getting in/out of bed, walking, or climbing stairs that is expected to last >3 days?: No Does the patient have a NEW difficulty with communication that is expected to last >3 days?: No Is the patient deaf or have difficulty hearing?: Yes Does the patient have difficulty seeing, even when wearing glasses/contacts?: No Does the patient have difficulty concentrating, remembering, or making decisions?: No  Permission Sought/Granted                  Emotional Assessment              Admission diagnosis:  Dehydration [E86.0] Pneumonia [J18.9] Community acquired pneumonia, unspecified laterality [J18.9] Patient Active Problem List   Diagnosis Date Noted   AKI (acute kidney injury)  (HCC) 12/10/2022   Hyponatremia 12/10/2022   Reactive airway disease 12/10/2022   Interstitial lung disease (HCC) 12/10/2022   Acute CHF (congestive heart failure) (HCC) 12/10/2022   GAD (generalized anxiety disorder) 12/10/2022   Age-related physical debility 12/10/2022   Multifocal pneumonia 12/09/2022   Other intervertebral disc degeneration, lumbar region 09/23/2022   No-show for appointment 04/22/2019   Frequent PVCs    Bradycardia    Hypertensive heart disease    Hyperlipidemia 02/02/2014   Fracture of humerus, proximal, right, closed 10/31/2013   Proximal humerus fracture 10/31/2013   UTI (urinary tract infection) 09/24/2013   Acute on chronic diastolic congestive heart failure (HCC) 09/24/2013   Abnormal EKG 09/24/2013   Acute on chronic diastolic heart failure (HCC) 09/24/2013   Infection of left eye 09/24/2013   Near syncope 09/24/2012   Hypothyroidism 10/30/2011   Chest pain 10/29/2011   Chronic diastolic CHF (congestive heart failure) (HCC) 07/05/2010   Nonspecific (abnormal) findings on radiological and other examination of body structure 11/12/2009   CT, CHEST, ABNORMAL 11/12/2009   DYSPNEA ON EXERTION 06/05/2009   PCP:  Shireen Quan, DO Pharmacy:   [facility] - [place], [place] -  [address] [place] [address] Phone: [phone] Fax: [phone]  [facility] [place], [place] - [address] [place] [address] Phone: [phone] Fax: [phone]  [id number] [place], [place] - [address] [place] [address] Phone: [phone] Fax: [phone]  [facility] [address] [place] [address] Phone: [phone] Fax: [phone]     Social Determinants of Health (SDOH) Social History: SDOH Screenings   Food Insecurity: No Food Insecurity (12/10/2022)   Housing: Low Risk  (12/10/2022)  Transportation Needs: No Transportation Needs (12/10/2022)  Utilities: Not At Risk (12/10/2022)  Tobacco Use: Low Risk  (12/10/2022)   SDOH Interventions:     Readmission Risk Interventions     No data to display

## 2022-12-15 NOTE — Discharge Summary (Signed)
Physician Discharge Summary   Patient: [name] MRN: [id number] DOB: [birth year]  Admit date:     12/09/2022  Discharge date: 12/15/22  Discharge Physician: Alba Cory   PCP: Shireen Quan, DO   Recommendations at discharge:    Follow Up resolution PNA Monitor volume status. Due to HF and Severe aortic stenosis.  Needs Palliative care consult for goals of care, in setting sever aortic stenosis.  Needs Bmet to follow renal function.  Needs 2 L oxygen supplementation   Discharge Diagnoses: Principal Problem:   Multifocal pneumonia Active Problems:   Chronic diastolic CHF (congestive heart failure) (HCC)   AKI (acute kidney injury) (HCC)   Hyponatremia   Interstitial lung disease (HCC)   Hypothyroidism   Reactive airway disease   Acute CHF (congestive heart failure) (HCC)   GAD (generalized anxiety disorder)   Age-related physical debility  Resolved Problems:   * No resolved hospital problems. *  Hospital Course: 89 year old with past medical history significant for grade 2 diastolic heart failure, preserved ejection fraction, frequent PVC and bradycardia, hypertension, hypothyroidism and recurrent UTI, chronic dyspnea, hypothyroidism, nephrolithiasis, interstitial lung disease presents complaining of cough, congestion, decreased appetite for 1 week. Evaluation in the ED she was noted to be tachypneic oxygen saturation 92 on room air, chest x-ray increase bilateral interstitial and patchy opacity which could represent pulmonary edema or multifocal pneumonia. Patient was admitted for multifocal pneumonia, the same night of admission developed acute hypoxic respiratory failure and was placed on 5 L of oxygen.   Assessment and Plan: 1-Acute Hypoxic Respiratory failure; in setting of acute Multifocal  PNA, also component aspiration PNA>  -Continue with nebulizer, , Xopenex.  -Continue with Guaifenesin.  -Discussed with Husband and son at bedside, patient would not want to be  resuscitated or be place on Life support. DNR, DNI.  -Stable on 2 L oxygen.  -Respiratory panel negative, COVID RSV negative WBC increasing, change IV antibiotics to Vancomycin and Zosyn. 11/28.  Tussionex seems to help with chest pain.  Continue with  Pulmicort, brovana  WBC down to 18. Plan to discharge on Levaquin 4 more days antibiotics.     Acute Metabolic Encephalopathy, in setting of PNA , hypoxia and or medications. Received a dose of ativan.  Avoid Ativan.  She is more alert.    Acute on Chronic diastolic heart failure: Severe Aortic stenosis and moderate mitral stenosis.  Plan to start IV lasix, BP increasing. Received a dose today.  Elevation of troponin, cardiology consulted. Likely in setting HF and aortic stenosis.  No candidate for any other cardiac intervention.  IV lasix 20 mg IV 11/29.,  Discharge on Lasix to 20 mg.  Monitor volume status.  Needs palliative care consult.   AKI: Prerenal received  IV fluids.   Hyponatremia: Monitor on fluids   Interstitial lung disease: Continue with nebulizer   Hypothyroidism: Continue Synthroid.    History of PVC and bradycardia:  Resume low dose metoprolol/ change from carvedilol.    Generalized anxiety disorder: resume Zoloft Resume home dose xanax, low dose   Advanced age and debility : PT OT consulted Hypokalemia; replaced   Estimated body mass index is 25.01 kg/m as calculated from the following:   Height as of this encounter: 5\' 4"  (1.626 m).   Weight as of this encounter: 66.1 kg.          Consultants: Cardiology  Procedures performed: ECHO Disposition: Skilled nursing facility Diet recommendation:  Discharge Diet Orders (From admission, onward)  Start     Ordered   12/15/22 0000  Diet - low sodium heart healthy        12/15/22 0957           Cardiac diet DISCHARGE MEDICATION: Allergies as of 12/15/2022       Reactions   Diltiazem Hcl Er Beads Other (See Comments)   Dizziness (not  present on the [facility] in 2024)        Medication List     STOP taking these medications    amLODipine 5 MG tablet Commonly known as: NORVASC   carvedilol 3.125 MG tablet Commonly known as: COREG   CeleBREX 200 MG capsule Generic drug: celecoxib       TAKE these medications    acetaminophen 500 MG tablet Commonly known as: TYLENOL Take 500 mg by mouth every 6 (six) hours as needed (for pain).   albuterol 108 (90 Base) MCG/ACT inhaler Commonly known as: VENTOLIN HFA Inhale 1 puff into the lungs every 4 (four) hours as needed for wheezing or shortness of breath.   albuterol (2.5 MG/3ML) 0.083% nebulizer solution Commonly known as: PROVENTIL Take 3 mLs (2.5 mg total) by nebulization every 6 (six) hours as needed for wheezing or shortness of breath.   ALPRAZolam 0.25 MG tablet Commonly known as: XANAX Take 0.125 mg by mouth 2 (two) times daily as needed for anxiety.   AQUAPHOR HEALING BALM EX Apply 1 application  topically See admin instructions. Apply to the lips 3 times a day   arformoterol 15 MCG/2ML Nebu Commonly known as: BROVANA Take 2 mLs (15 mcg total) by nebulization 2 (two) times daily.   budesonide 0.25 MG/2ML nebulizer solution Commonly known as: PULMICORT Take 2 mLs (0.25 mg total) by nebulization 2 (two) times daily.   cholecalciferol 1000 units tablet Commonly known as: VITAMIN D Take 1,000 Units by mouth daily.   furosemide 20 MG tablet Commonly known as: LASIX Take 1 tablet (20 mg total) by mouth daily.   guaiFENesin 600 MG 12 hr tablet Commonly known as: MUCINEX Take 2 tablets (1,200 mg total) by mouth 2 (two) times daily for 20 days.   ipratropium 0.02 % nebulizer solution Commonly known as: ATROVENT Take 2.5 mLs (0.5 mg total) by nebulization 2 (two) times daily.   ketoconazole 2 % shampoo Commonly known as: NIZORAL Apply 1 Application topically See admin instructions. Shampoo in the morning on Wednesdays and Saturdays   levalbuterol  0.63 MG/3ML nebulizer solution Commonly known as: XOPENEX Take 3 mLs (0.63 mg total) by nebulization 2 (two) times daily.   levofloxacin 500 MG tablet Commonly known as: LEVAQUIN Take 1 tablet (500 mg total) by mouth daily for 4 days.   levothyroxine 75 MCG tablet Commonly known as: SYNTHROID Take 75 mcg by mouth daily before breakfast.   metoprolol tartrate 25 MG tablet Commonly known as: LOPRESSOR Take 0.5 tablets (12.5 mg total) by mouth 2 (two) times daily.   nitroGLYCERIN 0.4 MG SL tablet Commonly known as: NITROSTAT Place 0.4 mg under the tongue every 5 (five) minutes x 3 doses as needed for chest pain (call MD, if not effective).   pantoprazole 40 MG tablet Commonly known as: PROTONIX Take 1 tablet (40 mg total) by mouth 2 (two) times daily.   potassium chloride SA 20 MEQ tablet Commonly known as: KLOR-CON M Take 1 tablet (20 mEq total) by mouth daily.   pravastatin 20 MG tablet Commonly known as: PRAVACHOL Take 20 mg by mouth every evening.   PreserVision  AREDS 2+Multi Vit Caps Take 1 capsule by mouth 2 (two) times daily.   sertraline 100 MG tablet Commonly known as: ZOLOFT Take 150 mg by mouth in the morning.        Contact information for follow-up providers     Perlie Gold, PA-C Follow up.   Specialty: Cardiology Why: Thursday Jan 15, 2023 Arrive by 8:10 AMAppt at 8:25 AM (25 min) Contact information: [address], [address] [place] [address] [phone]              Contact information for after-discharge care     Destination     [facility] .   Service: Skilled Nursing Contact information: [address] [place] [address] [phone]                    Discharge Exam: Ceasar Mons Weights   12/13/22 0500 12/14/22 0355 12/15/22 0500  Weight: 64.3 kg 66.1 kg 63 kg   General; NAD  Condition at discharge: stable  The results of significant diagnostics from this  hospitalization (including imaging, microbiology, ancillary and laboratory) are listed below for reference.   Imaging Studies: ECHOCARDIOGRAM COMPLETE  Result Date: 12/11/2022    ECHOCARDIOGRAM REPORT   Patient Name:   MARYORI STAMOUR Date of Exam: 12/11/2022 Medical Rec #:  [id number]   Height:       64.0 in Accession #:    [id number]  Weight:       145.9 lb Date of Birth:  18-Nov-1921   BSA:          1.711 m Patient Age:    89 years    BP:           137/55 mmHg Patient Gender: F           HR:           109 bpm. Exam Location:  Inpatient Procedure: 2D Echo, Cardiac Doppler and Color Doppler Indications:    I50.40* Unspecified combined systolic (congestive) and diastolic                 (congestive) heart failure  History:        Patient has prior history of Echocardiogram examinations, most                 recent 10/05/2015. CHF, Abnormal ECG, Arrythmias:Bradycardia,                 Signs/Symptoms:Shortness of Breath and Dyspnea; Risk                 Factors:Dyslipidemia and Hypertension.  Sonographer:    Sheralyn Boatman RDCS Referring Phys: [id number] SUBRINA SUNDIL IMPRESSIONS  1. Left ventricular ejection fraction, by estimation, is 60 to 65%. The left ventricle has normal function. The left ventricle has no regional wall motion abnormalities. There is mild left ventricular hypertrophy. Indeterminate diastolic filling due to E-A fusion. Elevated left ventricular end-diastolic pressure.  2. Right ventricular systolic function is normal. The right ventricular size is normal. There is normal pulmonary artery systolic pressure.  3. The mitral valve is abnormal. Trivial mitral valve regurgitation. Moderate mitral stenosis. The mean mitral valve gradient is 8.3 mmHg in the setting of tachycardia. Severe mitral annular calcification.  4. The aortic valve is calcified. There is severe calcifcation of the aortic valve. There is severe thickening of the aortic valve. Aortic valve regurgitation is not visualized. Severe aortic valve  stenosis. Aortic valve area, by VTI measures  0.74 cm. Aortic valve mean gradient measures 38.0 mmHg. Aortic valve Vmax measures 4.10 m/s. DVI is 0.27.  5. The inferior vena cava is normal in size with greater than 50% respiratory variability, suggesting right atrial pressure of 3 mmHg. Comparison(s): No prior Echocardiogram. Severe aortic stenosis and moderate mitral stenosis. FINDINGS  Left Ventricle: Left ventricular ejection fraction, by estimation, is 60 to 65%. The left ventricle has normal function. The left ventricle has no regional wall motion abnormalities. The left ventricular internal cavity size was normal in size. There is  mild left ventricular hypertrophy. Indeterminate diastolic filling due to E-A fusion. Elevated left ventricular end-diastolic pressure. Right Ventricle: The right ventricular size is normal. No increase in right ventricular wall thickness. Right ventricular systolic function is normal. There is normal pulmonary artery systolic pressure. The tricuspid regurgitant velocity is 2.33 m/s, and  with an assumed right atrial pressure of 3 mmHg, the estimated right ventricular systolic pressure is 24.7 mmHg. Left Atrium: Left atrial size was normal in size. Right Atrium: Right atrial size was normal in size. Pericardium: There is no evidence of pericardial effusion. Mitral Valve: The mitral valve is abnormal. Severe mitral annular calcification. Trivial mitral valve regurgitation. Moderate mitral valve stenosis. MV peak gradient, 15.6 mmHg. The mean mitral valve gradient is 8.3 mmHg. Tricuspid Valve: The tricuspid valve is normal in structure. Tricuspid valve regurgitation is mild . No evidence of tricuspid stenosis. Aortic Valve: The aortic valve is calcified. There is severe calcifcation of the aortic valve. There is severe thickening of the aortic valve. Aortic valve regurgitation is not visualized. Severe aortic stenosis is present. Aortic valve mean gradient measures 38.0 mmHg. Aortic  valve peak gradient measures 67.1 mmHg. Aortic valve area, by VTI measures 0.74 cm. Pulmonic Valve: The pulmonic valve was normal in structure. Pulmonic valve regurgitation is mild. No evidence of pulmonic stenosis. Aorta: The aortic root and ascending aorta are structurally normal, with no evidence of dilitation. Venous: The inferior vena cava is normal in size with greater than 50% respiratory variability, suggesting right atrial pressure of 3 mmHg. IAS/Shunts: No atrial level shunt detected by color flow Doppler.  LEFT VENTRICLE PLAX 2D LVIDd:         3.35 cm     Diastology LVIDs:         2.50 cm     LV e' medial:    11.40 cm/s LV PW:         1.55 cm     LV E/e' medial:  15.1 LV IVS:        1.10 cm     LV e' lateral:   4.13 cm/s LVOT diam:     2.15 cm     LV E/e' lateral: 41.6 LV SV:         58 LV SV Index:   34 LVOT Area:     3.63 cm  LV Volumes (MOD) LV vol d, MOD A2C: 69.7 ml LV vol d, MOD A4C: 51.6 ml LV vol s, MOD A2C: 26.7 ml LV vol s, MOD A4C: 19.4 ml LV SV MOD A2C:     43.0 ml LV SV MOD A4C:     51.6 ml LV SV MOD BP:      36.6 ml RIGHT VENTRICLE            IVC RV S prime:     9.68 cm/s  IVC diam: 1.80 cm TAPSE (M-mode): 1.9 cm LEFT ATRIUM  Index        RIGHT ATRIUM           Index LA diam:        3.40 cm 1.99 cm/m   RA Area:     10.80 cm LA Vol (A2C):   41.4 ml 24.19 ml/m  RA Volume:   19.50 ml  11.40 ml/m LA Vol (A4C):   25.1 ml 14.67 ml/m LA Biplane Vol: 35.5 ml 20.75 ml/m  AORTIC VALVE                     PULMONIC VALVE AV Area (Vmax):    0.98 cm      PR End Diast Vel: 1.51 msec AV Area (Vmean):   0.94 cm AV Area (VTI):     0.74 cm AV Vmax:           409.50 cm/s AV Vmean:          281.000 cm/s AV VTI:            0.783 m AV Peak Grad:      67.1 mmHg AV Mean Grad:      38.0 mmHg LVOT Vmax:         110.00 cm/s LVOT Vmean:        72.700 cm/s LVOT VTI:          0.160 m LVOT/AV VTI ratio: 0.20  AORTA Ao Root diam: 3.20 cm Ao Asc diam:  3.00 cm MITRAL VALVE                TRICUSPID VALVE  MV Area (PHT): 4.39 cm     TR Peak grad:   21.7 mmHg MV Area VTI:   1.95 cm     TR Vmax:        233.00 cm/s MV Peak grad:  15.6 mmHg MV Mean grad:  8.3 mmHg     SHUNTS MV Vmax:       1.97 m/s     Systemic VTI:  0.16 m MV Vmean:      132.3 cm/s   Systemic Diam: 2.15 cm MV Decel Time: 173 msec MV E velocity: 172.00 cm/s Vishnu Priya Mallipeddi Electronically signed by Winfield Rast Mallipeddi Signature Date/Time: 12/11/2022/12:43:31 PM    Final    DG Chest Port 1 View  Result Date: 12/11/2022 CLINICAL DATA:  [id number], [id number]. Chest pain and shortness of breath, fever. EXAM: PORTABLE CHEST 1 VIEW COMPARISON:  Portable chest yesterday at 3:37 a.m. FINDINGS: 2:38 a.m. There is mild cardiomegaly. Central vascular prominence and mild interstitial edema continue to be seen with small pleural effusions. There are increased bilateral perihilar opacities which could be alveolar edema or pneumonia, with persistent patchy airspace disease in the right upper lobe which probably is due to pneumonia. Findings are superimposed on chronic interstitial lung disease. The mediastinum is stable. There is aortic atherosclerosis.  No new osseous findings. Thoracic spondylosis.  Chronic right humeral ORIF. IMPRESSION: 1. Increased bilateral perihilar opacities which could be alveolar edema or pneumonia, with persistent patchy airspace disease in the right upper lobe which probably is due to pneumonia. 2. Findings are superimposed on chronic interstitial lung disease. 3. Cardiomegaly with central vascular prominence and mild interstitial edema. 4. Small pleural effusions. 5. Aortic atherosclerosis. Electronically Signed   By: Almira Bar M.D.   On: 12/11/2022 04:30   DG CHEST PORT 1 VIEW  Result Date: 12/10/2022 CLINICAL DATA:  Respiratory compromise. Increased shortness of breath and hypoxia EXAM: PORTABLE CHEST  1 VIEW COMPARISON:  12/09/2022 FINDINGS: Chronic lung disease with diffuse interstitial reticulation. Progression of  patchy bilateral airspace disease especially at the right apex. No effusion or pneumothorax. Stable heart size and mediastinal contours. IMPRESSION: Worsening pneumonia superimposed on interstitial lung disease. Electronically Signed   By: Tiburcio Pea M.D.   On: 12/10/2022 04:29   DG Chest Port 1 View  Result Date: 12/09/2022 CLINICAL DATA:  Three day history of productive cough, shortness of breath, decreased appetite EXAM: PORTABLE CHEST 1 VIEW COMPARISON:  Chest radiograph dated 01/31/2021, CT chest dated 07/22/2021 FINDINGS: Normal lung volumes. Increased bilateral interstitial and patchy opacities. Blunting of the bilateral costophrenic angles. No pneumothorax. The heart size and mediastinal contours are within normal limits. Partially imaged right proximal humeral fixation hardware. IMPRESSION: 1. Increased bilateral interstitial and patchy opacities, which may represent pulmonary edema or multifocal pneumonia. 2. Blunting of the bilateral costophrenic angles, which may represent small pleural effusions. Electronically Signed   By: Agustin Cree M.D.   On: 12/09/2022 16:51    Microbiology: Results for orders placed or performed during the hospital encounter of 12/09/22  Resp panel by RT-PCR (RSV, Flu A&B, Covid) Anterior Nasal Swab     Status: None   Collection Time: 12/09/22  2:53 PM   Specimen: Anterior Nasal Swab  Result Value Ref Range Status   SARS Coronavirus 2 by RT PCR NEGATIVE NEGATIVE Final    Comment: (NOTE) SARS-CoV-2 target nucleic acids are NOT DETECTED.  The SARS-CoV-2 RNA is generally detectable in upper respiratory specimens during the acute phase of infection. The lowest concentration of SARS-CoV-2 viral copies this assay can detect is 138 copies/mL. A negative result does not preclude SARS-Cov-2 infection and should not be used as the sole basis for treatment or other patient management decisions. A negative result may occur with  improper specimen collection/handling,  submission of specimen other than nasopharyngeal swab, presence of viral mutation(s) within the areas targeted by this assay, and inadequate number of viral copies(<138 copies/mL). A negative result must be combined with clinical observations, patient history, and epidemiological information. The expected result is Negative.  Fact Sheet for Patients:  BloggerCourse.com  Fact Sheet for Healthcare Providers:  SeriousBroker.it  This test is no t yet approved or cleared by the [place] FDA and  has been authorized for detection and/or diagnosis of SARS-CoV-2 by FDA under an Emergency Use Authorization (EUA). This EUA will remain  in effect (meaning this test can be used) for the duration of the COVID-19 declaration under Section 564(b)(1) of the Act, 21 U.S.C.section 360bbb-3(b)(1), unless the authorization is terminated  or revoked sooner.       Influenza A by PCR NEGATIVE NEGATIVE Final   Influenza B by PCR NEGATIVE NEGATIVE Final    Comment: (NOTE) The Xpert Xpress SARS-CoV-2/FLU/RSV plus assay is intended as an aid in the diagnosis of influenza from Nasopharyngeal swab specimens and should not be used as a sole basis for treatment. Nasal washings and aspirates are unacceptable for Xpert Xpress SARS-CoV-2/FLU/RSV testing.  Fact Sheet for Patients: BloggerCourse.com  Fact Sheet for Healthcare Providers: SeriousBroker.it  This test is not yet approved or cleared by the [place] FDA and has been authorized for detection and/or diagnosis of SARS-CoV-2 by FDA under an Emergency Use Authorization (EUA). This EUA will remain in effect (meaning this test can be used) for the duration of the COVID-19 declaration under Section 564(b)(1) of the Act, 21 U.S.C. section 360bbb-3(b)(1), unless the authorization is terminated or revoked.  Resp Syncytial Virus by PCR NEGATIVE  NEGATIVE Final    Comment: (NOTE) Fact Sheet for Patients: BloggerCourse.com  Fact Sheet for Healthcare Providers: SeriousBroker.it  This test is not yet approved or cleared by the [place] FDA and has been authorized for detection and/or diagnosis of SARS-CoV-2 by FDA under an Emergency Use Authorization (EUA). This EUA will remain in effect (meaning this test can be used) for the duration of the COVID-19 declaration under Section 564(b)(1) of the Act, 21 U.S.C. section 360bbb-3(b)(1), unless the authorization is terminated or revoked.  Performed at [facility], [address], [place], [place] [address]   Blood culture (routine x 2)     Status: None   Collection Time: 12/09/22  5:38 PM   Specimen: BLOOD LEFT FOREARM  Result Value Ref Range Status   Specimen Description   Final    BLOOD LEFT FOREARM Performed at [facility], [address], [place], [place] [address]    Special Requests   Final    BOTTLES DRAWN AEROBIC AND ANAEROBIC Blood Culture adequate volume Performed at [facility], [address], [place], [place] [address]    Culture   Final    NO GROWTH 5 DAYS Performed at [facility], [address], [place], [place] [address]    Report Status 12/14/2022 FINAL  Final  Blood culture (routine x 2)     Status: None   Collection Time: 12/09/22  5:39 PM   Specimen: BLOOD RIGHT FOREARM  Result Value Ref Range Status   Specimen Description   Final    BLOOD RIGHT FOREARM Performed at [facility], [address], [place], [place] [address]    Special Requests   Final    BOTTLES DRAWN AEROBIC AND ANAEROBIC Blood Culture adequate volume Performed at [facility], [address] [place], [place] [address]    Culture   Final    NO GROWTH 5 DAYS Performed at [facility], [address], [place], [place] [address]    Report  Status 12/14/2022 FINAL  Final  Respiratory (~20 pathogens) panel by PCR     Status: None   Collection Time: 12/10/22  2:30 PM   Specimen: Nasopharyngeal Swab; Respiratory  Result Value Ref Range Status   Adenovirus NOT DETECTED NOT DETECTED Final   Coronavirus 229E NOT DETECTED NOT DETECTED Final    Comment: (NOTE) The Coronavirus on the Respiratory Panel, DOES NOT test for the novel  Coronavirus (2019 nCoV)    Coronavirus HKU1 NOT DETECTED NOT DETECTED Final   Coronavirus NL63 NOT DETECTED NOT DETECTED Final   Coronavirus OC43 NOT DETECTED NOT DETECTED Final   Metapneumovirus NOT DETECTED NOT DETECTED Final   Rhinovirus / Enterovirus NOT DETECTED NOT DETECTED Final   Influenza A NOT DETECTED NOT DETECTED Final   Influenza B NOT DETECTED NOT DETECTED Final   Parainfluenza Virus 1 NOT DETECTED NOT DETECTED Final   Parainfluenza Virus 2 NOT DETECTED NOT DETECTED Final   Parainfluenza Virus 3 NOT DETECTED NOT DETECTED Final   Parainfluenza Virus 4 NOT DETECTED NOT DETECTED Final   Respiratory Syncytial Virus NOT DETECTED NOT DETECTED Final   Bordetella pertussis NOT DETECTED NOT DETECTED Final   Bordetella Parapertussis NOT DETECTED NOT DETECTED Final   Chlamydophila pneumoniae NOT DETECTED NOT DETECTED Final   Mycoplasma pneumoniae NOT DETECTED NOT DETECTED Final    Comment: Performed at [facility], [address], [place], [place] [address]  Expectorated Sputum Assessment w Gram Stain, Rflx to Resp Cult     Status: None   Collection Time: 12/10/22  2:53 PM   Specimen: Expectorated Sputum  Result Value Ref Range Status   Specimen Description EXPECTORATED SPUTUM  Final   Special Requests Normal  Final   Sputum evaluation   Final    THIS SPECIMEN IS ACCEPTABLE FOR SPUTUM CULTURE Performed at [facility], [address] [place], [place] [address]    Report Status 12/10/2022 FINAL  Final  Culture, Respiratory w Gram Stain     Status: None   Collection  Time: 12/10/22  2:53 PM  Result Value Ref Range Status   Specimen Description   Final    EXPECTORATED SPUTUM Performed at [facility], [address] [place], [place] [address]    Special Requests   Final    Normal Reflexed from [id number] Performed at [facility], [address] [place], [place] [address]    Gram Stain   Final    FEW WBC PRESENT, PREDOMINANTLY PMN RARE GRAM POSITIVE COCCI    Culture   Final    FEW Normal respiratory flora-no Staph aureus or Pseudomonas seen Performed at [facility], [address] [place], [place] [address]    Report Status 12/14/2022 FINAL  Final  MRSA Next Gen by PCR, Nasal     Status: None   Collection Time: 12/12/22 11:06 AM   Specimen: Nasal Mucosa; Nasal Swab  Result Value Ref Range Status   MRSA by PCR Next Gen NOT DETECTED NOT DETECTED Final    Comment: (NOTE) The GeneXpert MRSA Assay (FDA approved for NASAL specimens only), is one component of a comprehensive MRSA colonization surveillance program. It is not intended to diagnose MRSA infection nor to guide or monitor treatment for MRSA infections. Test performance is not FDA approved in patients less than 67 years old. Performed at [facility], [address], [place], [place] [address]     Labs: CBC: Recent Labs  Lab 12/11/22 [phone] 12/12/22 0417 12/13/22 0432 12/14/22 0736 12/15/22 0401  WBC 22.1* 24.2* 22.8* 22.0* 18.2*  HGB 9.8* 10.1* 9.7* 9.9* 9.4*  HCT 29.2* 30.6* 30.0* 30.4* 29.0*  MCV 96.7 98.7 99.7 97.7 100.0  PLT 391 369 441* 463* 462*   Basic Metabolic Panel: Recent Labs  Lab 12/11/22 0433 12/12/22 0417 12/13/22 0432 12/14/22 0736 12/15/22 0401  NA 132* 134* 132* 135 135  K 3.5 3.5 3.1* 3.3* 3.6  CL 100 98 97* 98 101  CO2 22 23 24 27 24   GLUCOSE 130* 110* 103* 101* 99  BUN 21 18 22 16 16   CREATININE 0.61 0.62 0.79  0.76 0.70 0.71  CALCIUM 8.6* 8.5* 8.4* 8.4* 8.6*   Liver Function  Tests: Recent Labs  Lab 12/10/22 0424  AST 28  ALT 32  ALKPHOS 114  BILITOT 1.2*  PROT 7.1  ALBUMIN 2.6*   CBG: Recent Labs  Lab 12/10/22 0251  GLUCAP 160*    Discharge time spent: greater than 30 minutes.  Signed: Alba Cory, MD [facility] 12/15/2022

## 2022-12-15 NOTE — Plan of Care (Signed)
  Problem: Respiratory: Goal: Ability to maintain adequate ventilation will improve Outcome: Progressing   Problem: Safety: Goal: Ability to remain free from injury will improve Outcome: Progressing   Problem: Skin Integrity: Goal: Risk for impaired skin integrity will decrease Outcome: Progressing   Problem: Education: Goal: Knowledge of General Education information will improve Description: Including pain rating scale, medication(s)/side effects and non-pharmacologic comfort measures Outcome: Progressing   Problem: Clinical Measurements: Goal: Ability to maintain clinical measurements within normal limits will improve Outcome: Progressing

## 2022-12-15 NOTE — Progress Notes (Signed)
Physical Therapy Treatment Patient Details [name] MRN: [id number] DOB: [birth year] Today's Date: 12/15/2022   History of Present Illness Pt is a 89 y.o. female presenting from PCP with low SpO2 and SBP, cough, and congestion on 12/09/2022. DG chest with worsending pneumonia superimposed on ILD.  PMH significant for grade 2 diastolic heart failure with preserved EF, near syncope, frequent PVC, bradycardia, HTN, hypothyroidism, recurrent UTI, DOE, nephrolithiasis, ILD.    PT Comments  Pt admitted with above diagnosis.  Pt currently with functional limitations due to the deficits listed below (see PT Problem List). Pt in bed when PT arrived. Pt agreeable to therapy intervention reported she had a good nights sleep and was going to go home today. Pt is to transition to SNF for continued skilled therapy intervention. Pt required min A for supine to sit, min A for sit to stand  from EOB to RW, gait tasks progressing with tolerance and on RA for 25 feet with RW, min A and recliner close with cues for encouragement, posture, breathing strategies. Pts spouse arrived during the course of tx intervention. Pt left seated in recliner, all needs in place, spouse present and on 2 L/min.  Pt will benefit from acute skilled PT to increase their independence and safety with mobility to allow discharge.      If plan is discharge home, recommend the following: A lot of help with walking and/or transfers;A lot of help with bathing/dressing/bathroom;Assistance with cooking/housework;Assist for transportation;Help with stairs or ramp for entrance;Supervision due to cognitive status;Direct supervision/assist for financial management;Direct supervision/assist for medications management   Can travel by private vehicle        Equipment Recommendations  None recommended by PT    Recommendations for Other Services       Precautions / Restrictions Precautions Precautions: Fall Restrictions Weight Bearing  Restrictions: No     Mobility  Bed Mobility Overal bed mobility: Needs Assistance Bed Mobility: Supine to Sit     Supine to sit: Min assist     General bed mobility comments: min A and cues with HOB elevated and increased time    Transfers Overall transfer level: Needs assistance Equipment used: Rolling walker (2 wheels) Transfers: Sit to/from Stand Sit to Stand: Min assist           General transfer comment: cues for encouragement, posture and use of RW for stability wtih hand placement    Ambulation/Gait Ambulation/Gait assistance: +2 safety/equipment, Min assist Gait Distance (Feet): 25 Feet Assistive device: Rolling walker (2 wheels) Gait Pattern/deviations: Step-to pattern, Shuffle, Trunk flexed, Narrow base of support Gait velocity: decreased     General Gait Details: pt required max cues for encouragement for increased distances in hallway. pt rquired cues for maintaining B UE support at RW, pursed lip breathing and posture. pt on RA for gait tasks today and 91% with exertion   Stairs             Wheelchair Mobility     Tilt Bed    Modified Rankin (Stroke Patients Only)       Balance Overall balance assessment: Needs assistance Sitting-balance support: Feet supported Sitting balance-Leahy Scale: Fair     Standing balance support: Bilateral upper extremity supported, During functional activity, Reliant on assistive device for balance Standing balance-Leahy Scale: Poor                              Cognition Arousal: Alert Behavior During  Therapy: WFL for tasks assessed/performed Overall Cognitive Status: Impaired/Different from baseline Area of Impairment: Memory, Following commands, Safety/judgement, Awareness, Problem solving, Attention                   Current Attention Level: Sustained Memory: Decreased recall of precautions Following Commands: Follows one step commands consistently, Follows one step commands  with increased time (intermittently needing cues secondary to hard of hearing and poor problem solving) Safety/Judgement: Decreased awareness of deficits, Decreased awareness of safety Awareness: Emergent Problem Solving: Slow processing, Difficulty sequencing, Requires verbal cues, Requires tactile cues          Exercises      General Comments General comments (skin integrity, edema, etc.): RA at rest 93-96% and with exertion 91-94%      Pertinent Vitals/Pain Pain Assessment Pain Assessment: No/denies pain    Home Living                          Prior Function            PT Goals (current goals can now be found in the care plan section) Acute Rehab PT Goals Patient Stated Goal: to get energy back PT Goal Formulation: With patient Time For Goal Achievement: 12/24/22 Potential to Achieve Goals: Fair Progress towards PT goals: Progressing toward goals    Frequency    Min 1X/week      PT Plan      Co-evaluation              AM-PAC PT "6 Clicks" Mobility   Outcome Measure  Help needed turning from your back to your side while in a flat bed without using bedrails?: A Little Help needed moving from lying on your back to sitting on the side of a flat bed without using bedrails?: A Little Help needed moving to and from a bed to a chair (including a wheelchair)?: A Little Help needed standing up from a chair using your arms (e.g., wheelchair or bedside chair)?: A Little Help needed to walk in hospital room?: A Little Help needed climbing 3-5 steps with a railing? : Total 6 Click Score: 16    End of Session Equipment Utilized During Treatment: Gait belt Activity Tolerance: Patient limited by fatigue Patient left: with family/visitor present;in chair;with call bell/phone within reach Nurse Communication: Mobility status PT Visit Diagnosis: Unsteadiness on feet (R26.81);Other abnormalities of gait and mobility (R26.89);Muscle weakness (generalized)  (M62.81);Difficulty in walking, not elsewhere classified (R26.2)     Time: 1610-9604 PT Time Calculation (min) (ACUTE ONLY): 16 min  Charges:    $Gait Training: 8-22 mins PT General Charges $$ ACUTE PT VISIT: 1 Visit                     Johnny Bridge, PT Acute Rehab    Jacqualyn Posey 12/15/2022, 12:16 PM

## 2022-12-16 ENCOUNTER — Non-Acute Institutional Stay (SKILLED_NURSING_FACILITY): Payer: Self-pay | Admitting: Orthopedic Surgery

## 2022-12-16 ENCOUNTER — Other Ambulatory Visit: Payer: Self-pay | Admitting: Orthopedic Surgery

## 2022-12-16 ENCOUNTER — Encounter: Payer: Self-pay | Admitting: Orthopedic Surgery

## 2022-12-16 DIAGNOSIS — R41841 Cognitive communication deficit: Secondary | ICD-10-CM | POA: Diagnosis not present

## 2022-12-16 DIAGNOSIS — F411 Generalized anxiety disorder: Secondary | ICD-10-CM | POA: Diagnosis not present

## 2022-12-16 DIAGNOSIS — M6281 Muscle weakness (generalized): Secondary | ICD-10-CM | POA: Diagnosis not present

## 2022-12-16 DIAGNOSIS — J849 Interstitial pulmonary disease, unspecified: Secondary | ICD-10-CM

## 2022-12-16 DIAGNOSIS — R2681 Unsteadiness on feet: Secondary | ICD-10-CM

## 2022-12-16 DIAGNOSIS — R1312 Dysphagia, oropharyngeal phase: Secondary | ICD-10-CM | POA: Diagnosis not present

## 2022-12-16 DIAGNOSIS — I5033 Acute on chronic diastolic (congestive) heart failure: Secondary | ICD-10-CM

## 2022-12-16 DIAGNOSIS — J189 Pneumonia, unspecified organism: Secondary | ICD-10-CM

## 2022-12-16 DIAGNOSIS — F419 Anxiety disorder, unspecified: Secondary | ICD-10-CM

## 2022-12-16 DIAGNOSIS — N179 Acute kidney failure, unspecified: Secondary | ICD-10-CM

## 2022-12-16 DIAGNOSIS — G9341 Metabolic encephalopathy: Secondary | ICD-10-CM | POA: Diagnosis not present

## 2022-12-16 DIAGNOSIS — E039 Hypothyroidism, unspecified: Secondary | ICD-10-CM

## 2022-12-16 DIAGNOSIS — I493 Ventricular premature depolarization: Secondary | ICD-10-CM

## 2022-12-16 DIAGNOSIS — J9601 Acute respiratory failure with hypoxia: Secondary | ICD-10-CM | POA: Diagnosis not present

## 2022-12-16 MED ORDER — ALPRAZOLAM 0.25 MG PO TABS: 0.1250 mg | ORAL_TABLET | Freq: Two times a day (BID) | ORAL | 0 refills | Status: AC | PRN

## 2022-12-16 NOTE — Progress Notes (Signed)
Location:  [facility] Room Number: 903-B Place of Service:  ALF [phone]) Provider:  Saachi Zale Fernande Boyden, DO  Patient Care Team: Shireen Quan, DO as PCP - General (Family Medicine) [name], Deloris Ping, MD as PCP - Cardiology (Cardiology)  Extended Emergency Contact Information Primary Emergency Contact: Bia,[name] Mobile Phone: [phone] Relation: Son Secondary Emergency Contact: [name] Address: [address]          [place], [place] Mobile Phone: [phone] Relation: Spouse  Code Status:  DNR Goals of care: Advanced Directive information    12/16/2022   10:09 AM  Advanced Directives  Does Patient Have a Medical Advance Directive? Yes  Type of Estate agent of [place];Living will  Does patient want to make changes to medical advance directive? No - Patient declined  Copy of Healthcare Power of Attorney in Chart? Yes - validated most recent copy scanned in chart (See row information)     Chief Complaint  Patient presents with   Hospitalization Follow-up    HPI:  Pt is a 89 y.o. female seen today for a hospital f/u s/p admission from [facility]12[facility].   She currently resides on the skilled nursing unit at [facility]. PMH: diastolic heart failure, HTN, HLD, PVC's, interstitial lung disease, DOE, hypothyroidism, anxiety and unstable gait.   Resident of [facility] assisted living.   Follows Dr. Ladona Horns at [place]. 11/26 primary advised her to report to ED due to increased shortness of breath and concerns for sepsis. CXR noted increased bilateral interstitial and patchy opacities> pulmonary edema versus multifocal pneumonia. She was hypoxic as well. Initially started on 5 liters oxygen> weaned now to 2 liters. Her pneumonia improved with IV vancomycin and Zosyn. She was also given xopenex, Pulmicort, Brovana, guaifenesin and tussionex.  Transitioned to po  Levaquin at discharge. BNP 766.2, troponin 225 . Cardiology thought elevated troponin associated with HF and aortic stenosis. She was given IV furosemide. H/o anxiety, she has increased confusion with ativan. Advised to avoid ativan in future. Due to advanced age and weakness, she was discharged to SNF for nursing and PT/OT services.   Today, she continues to wear 2 liters continuous oxygen. She admits to some shortness of breath, especially with exertion. C/o feeling tired and not sleeping well in hospital. Denies chest pain and dizziness. Appetite fair. She was able to have BM this morning. Ambulating with w/c. Awaiting PT/OT/ST evaluation. Upset she did not receive xanax last night. It was been ordered today. Denies panic. Afebrile. Vitals stable.   Past Medical History:  Diagnosis Date   Anxiety    Aortic stenosis    a. mild by echo 2017.   Arthritis    Bradycardia    a. ? details not clear - in the past has had PVCs which have lowered effective HR count by pulse check.   Chronic diastolic CHF (congestive heart failure) (HCC)    Diastolic dysfunction    mild    Frequent PVCs    a. 48 Hour Holter 10/08/15 showed NSR and sinus tach with frequent PVCs, occasional bigeminy, total PVC burden 16.5%, lowest HR 59.   Hyperlipidemia    Hypertension    Hypertensive heart disease    Hypothyroidism    Kidney stones    Mild mitral regurgitation by prior echocardiogram    Tachycardia    history of tachycardia and bradycardia   Past Surgical History:  Procedure Laterality Date  APPENDECTOMY     CHOLECYSTECTOMY     EXTRACORPOREAL SHOCK WAVE LITHOTRIPSY     EYE SURGERY Bilateral    cataract surgery with lens implant   ORIF HUMERUS FRACTURE Right 10/31/2013   Procedure: Open Reduction Internal Fixation Right Proximal Humerus Fracture;  Surgeon: Eldred Manges, MD;  Location: [facility] OR;  Service: Orthopedics;  Laterality: Right;   TONSILLECTOMY     TOTAL ABDOMINAL HYSTERECTOMY      Allergies   Allergen Reactions   Diltiazem Hcl Er Beads Other (See Comments)    Dizziness (not present on the [facility] in 2024)    Outpatient Encounter Medications as of 12/16/2022  Medication Sig   acetaminophen (TYLENOL) 500 MG tablet Take 500 mg by mouth every 6 (six) hours as needed (for pain).   albuterol (PROVENTIL) (2.5 MG/3ML) 0.083% nebulizer solution Take 3 mLs (2.5 mg total) by nebulization every 6 (six) hours as needed for wheezing or shortness of breath.   albuterol (VENTOLIN HFA) 108 (90 Base) MCG/ACT inhaler Inhale 1 puff into the lungs every 4 (four) hours as needed for wheezing or shortness of breath.   ALPRAZolam (XANAX) 0.25 MG tablet Take 0.5 tablets (0.125 mg total) by mouth 2 (two) times daily as needed for anxiety.   arformoterol (BROVANA) 15 MCG/2ML NEBU Take 2 mLs (15 mcg total) by nebulization 2 (two) times daily.   budesonide (PULMICORT) 0.25 MG/2ML nebulizer solution Take 2 mLs (0.25 mg total) by nebulization 2 (two) times daily.   cholecalciferol (VITAMIN D) 1000 units tablet Take 1,000 Units by mouth daily.   Emollient (AQUAPHOR HEALING BALM EX) Apply 1 application  topically See admin instructions. Apply to the lips 3 times a day   furosemide (LASIX) 20 MG tablet Take 1 tablet (20 mg total) by mouth daily.   guaiFENesin (MUCINEX) 600 MG 12 hr tablet Take 2 tablets (1,200 mg total) by mouth 2 (two) times daily for 20 days.   ipratropium (ATROVENT) 0.02 % nebulizer solution Take 2.5 mLs (0.5 mg total) by nebulization 2 (two) times daily.   ketoconazole (NIZORAL) 2 % shampoo Apply 1 Application topically See admin instructions. Shampoo in the morning on Wednesdays and Saturdays   levalbuterol (XOPENEX) 0.63 MG/3ML nebulizer solution Take 3 mLs (0.63 mg total) by nebulization 2 (two) times daily.   levofloxacin (LEVAQUIN) 500 MG tablet Take 1 tablet (500 mg total) by mouth daily for 4 days.   levothyroxine (SYNTHROID) 75 MCG tablet Take 75 mcg by mouth daily before breakfast.    metoprolol tartrate (LOPRESSOR) 25 MG tablet Take 0.5 tablets (12.5 mg total) by mouth 2 (two) times daily.   Multiple Vitamins-Minerals (PRESERVISION AREDS 2+MULTI VIT) CAPS Take 1 capsule by mouth 2 (two) times daily.   nitroGLYCERIN (NITROSTAT) 0.4 MG SL tablet Place 0.4 mg under the tongue every 5 (five) minutes x 3 doses as needed for chest pain (call MD, if not effective).   pantoprazole (PROTONIX) 40 MG tablet Take 1 tablet (40 mg total) by mouth 2 (two) times daily.   potassium chloride SA (KLOR-CON M) 20 MEQ tablet Take 1 tablet (20 mEq total) by mouth daily.   pravastatin (PRAVACHOL) 20 MG tablet Take 20 mg by mouth every evening.   sertraline (ZOLOFT) 100 MG tablet Take 150 mg by mouth in the morning.   Zinc Oxide 20 % PSTE Apply topically. Apply to peri/buttocks topically as needed for After incontinent episodes as preventative unsupervised self-administration May keep at bedside   No facility-administered encounter medications on file as  of 12/16/2022.    Review of Systems  Constitutional:  Negative for activity change and appetite change.  HENT:  Positive for hearing loss. Negative for congestion, sore throat and trouble swallowing.   Eyes:  Negative for visual disturbance.  Respiratory:  Positive for shortness of breath and wheezing. Negative for cough.   Cardiovascular:  Positive for leg swelling. Negative for chest pain.  Gastrointestinal:  Negative for abdominal distention, abdominal pain, constipation, diarrhea, nausea and vomiting.  Genitourinary:  Negative for dysuria, frequency and hematuria.  Musculoskeletal:  Positive for gait problem.  Skin:  Negative for wound.  Neurological:  Positive for weakness. Negative for dizziness, light-headedness and headaches.  Psychiatric/Behavioral:  Positive for sleep disturbance. Negative for confusion and dysphoric mood. The patient is nervous/anxious.     Immunization History  Administered Date(s) Administered   Influenza Split  09/15/2008, 10/11/2009, 09/09/2013, 11/17/2017, 10/29/2020   Influenza, High Dose Seasonal PF 10/15/2015, 10/13/2016, 11/17/2017, 10/28/2018   Moderna Sars-Covid-2 Vaccination 01/17/2019, 02/14/2019, 11/16/2019, 06/12/2020   Pfizer Covid-19 Vaccine Bivalent Booster 68yrs & up 10/02/2020   Pneumococcal Conjugate-13 08/01/2013   Pneumococcal Polysaccharide-23 03/05/1994   Td 06/26/2005   Tdap 01/27/2020   Zoster, Live 11/27/2011, 01/27/2020, 07/30/2020   Pertinent  Health Maintenance Due  Topic Date Due   DEXA SCAN  Never done   INFLUENZA VACCINE  08/14/2022      02/20/2018    3:53 PM 11/15/2020    3:16 PM 01/31/2021    5:46 PM  Fall Risk  (RETIRED) Patient Fall Risk Level Low fall risk High fall risk High fall risk   Functional Status Survey:    Vitals:   12/16/22 1007  BP: (!) 157/72  Pulse: 100  Resp: 16  Temp: 97.8 F (36.6 C)  SpO2: 96%  Height: 5\' 4"  (1.626 m)   Body mass index is 23.84 kg/m. Physical Exam Vitals reviewed.  Constitutional:      General: She is not in acute distress. HENT:     Head: Normocephalic.     Right Ear: Tympanic membrane normal. There is no impacted cerumen.     Left Ear: Tympanic membrane normal. There is no impacted cerumen.     Nose: Nose normal.     Mouth/Throat:     Mouth: Mucous membranes are moist.  Eyes:     General:        Right eye: No discharge.        Left eye: No discharge.  Cardiovascular:     Rate and Rhythm: Normal rate and regular rhythm.     Pulses: Normal pulses.     Heart sounds: Normal heart sounds.  Pulmonary:     Effort: Pulmonary effort is normal. No respiratory distress.     Breath sounds: Wheezing and rales present. No rhonchi.  Abdominal:     General: Bowel sounds are normal. There is no distension.     Palpations: Abdomen is soft.     Tenderness: There is no abdominal tenderness.  Musculoskeletal:     Cervical back: Neck supple.     Right lower leg: No edema.     Left lower leg: No edema.  Skin:     General: Skin is warm.     Capillary Refill: Capillary refill takes less than 2 seconds.  Neurological:     General: No focal deficit present.     Mental Status: She is alert and oriented to person, place, and time.     Motor: Weakness present.     Gait:  Gait abnormal.     Comments: Wheelchair/walker  Psychiatric:        Mood and Affect: Mood normal.     Labs reviewed: Recent Labs    12/13/22 0432 12/14/22 0736 12/15/22 0401  NA 132* 135 135  K 3.1* 3.3* 3.6  CL 97* 98 101  CO2 24 27 24   GLUCOSE 103* 101* 99  BUN 22 16 16   CREATININE 0.79  0.76 0.70 0.71  CALCIUM 8.4* 8.4* 8.6*   Recent Labs    12/10/22 0424  AST 28  ALT 32  ALKPHOS 114  BILITOT 1.2*  PROT 7.1  ALBUMIN 2.6*   Recent Labs    12/13/22 0432 12/14/22 0736 12/15/22 0401  WBC 22.8* 22.0* 18.2*  HGB 9.7* 9.9* 9.4*  HCT 30.0* 30.4* 29.0*  MCV 99.7 97.7 100.0  PLT 441* 463* 462*   Lab Results  Component Value Date   TSH 0.877 02/24/2017   Lab Results  Component Value Date   HGBA1C (H) 10/02/2009    6.0 (NOTE)                                                                       According to the ADA Clinical Practice Recommendations for 2011, when HbA1c is used as a screening test:   >=6.5%   Diagnostic of Diabetes Mellitus           (if abnormal result  is confirmed)  5.7-6.4%   Increased risk of developing Diabetes Mellitus  References:Diagnosis and Classification of Diabetes Mellitus,Diabetes Care,2011,34(Suppl 1):S62-S69 and Standards of Medical Care in         Diabetes - 2011,Diabetes Care,2011,34  (Suppl 1):S11-S61.   Lab Results  Component Value Date   CHOL 184 02/24/2017   HDL 53 02/24/2017   LDLCALC 95 02/24/2017   TRIG 179 (H) 02/24/2017   CHOLHDL 3.5 02/24/2017    Significant Diagnostic Results in last 30 days:  DG CHEST PORT 1 VIEW  Result Date: 12/10/2022 CLINICAL DATA:  Respiratory compromise. Increased shortness of breath and hypoxia EXAM: PORTABLE CHEST 1 VIEW  COMPARISON:  12/09/2022 FINDINGS: Chronic lung disease with diffuse interstitial reticulation. Progression of patchy bilateral airspace disease especially at the right apex. No effusion or pneumothorax. Stable heart size and mediastinal contours. IMPRESSION: Worsening pneumonia superimposed on interstitial lung disease. Electronically Signed   By: Tiburcio Pea M.D.   On: 12/10/2022 04:29   DG Chest Port 1 View  Result Date: 12/09/2022 CLINICAL DATA:  Three day history of productive cough, shortness of breath, decreased appetite EXAM: PORTABLE CHEST 1 VIEW COMPARISON:  Chest radiograph dated 01/31/2021, CT chest dated 07/22/2021 FINDINGS: Normal lung volumes. Increased bilateral interstitial and patchy opacities. Blunting of the bilateral costophrenic angles. No pneumothorax. The heart size and mediastinal contours are within normal limits. Partially imaged right proximal humeral fixation hardware. IMPRESSION: 1. Increased bilateral interstitial and patchy opacities, which may represent pulmonary edema or multifocal pneumonia. 2. Blunting of the bilateral costophrenic angles, which may represent small pleural effusions. Electronically Signed   By: Agustin Cree M.D.   On: 12/09/2022 16:51    Assessment/Plan 1. Multifocal pneumonia - hospitalized 11/26-12/02 - WBC 18 on admission - CXR questioned pulmonary edema versus multifocal PNA - given IV vancomycin and  Zosyn - cont oxygen - cont Levaquin  - cont nebs (albuterol/ atrovent/ xopenex), Brovana and Pulmicort - cont Acapella  2. Acute on chronic diastolic congestive heart failure (HCC) - see above - BNP 766, troponin 225 - cardiology consulted> thought elevated troponin due to HF - cont furosemide - start daily weights x 3 weeks - recheck BMP 12/05  3. AKI (acute kidney injury) (HCC) - resolved - repeat bmp 12/05 - encourage hydration with water   4. Interstitial lung disease (HCC) - see above - cont nebulizer  5. Hypothyroidism,  unspecified type - cont levothyroxine  6. Frequent PVCs - carvedilol discontinued  - cont metoprolol  7. GAD (generalized anxiety disorder) - unable to tolerate ativan  - cont xanax  8. Unstable gait - awaiting PT/OT evaluation     Family/ staff Communication: plan discussed with patient and nurse  Labs/tests ordered:  bmp 12/05, daily weights x 3 weeks> then 3x/weekly

## 2022-12-17 ENCOUNTER — Encounter: Payer: Self-pay | Admitting: Orthopedic Surgery

## 2022-12-17 ENCOUNTER — Non-Acute Institutional Stay (SKILLED_NURSING_FACILITY): Payer: Medicare Other | Admitting: Orthopedic Surgery

## 2022-12-17 DIAGNOSIS — F419 Anxiety disorder, unspecified: Secondary | ICD-10-CM

## 2022-12-17 DIAGNOSIS — J189 Pneumonia, unspecified organism: Secondary | ICD-10-CM | POA: Diagnosis not present

## 2022-12-17 DIAGNOSIS — R2681 Unsteadiness on feet: Secondary | ICD-10-CM | POA: Diagnosis not present

## 2022-12-17 DIAGNOSIS — R000 Tachycardia, unspecified: Secondary | ICD-10-CM | POA: Diagnosis not present

## 2022-12-17 DIAGNOSIS — R41841 Cognitive communication deficit: Secondary | ICD-10-CM | POA: Diagnosis not present

## 2022-12-17 DIAGNOSIS — R1312 Dysphagia, oropharyngeal phase: Secondary | ICD-10-CM | POA: Diagnosis not present

## 2022-12-17 DIAGNOSIS — M6281 Muscle weakness (generalized): Secondary | ICD-10-CM | POA: Diagnosis not present

## 2022-12-17 DIAGNOSIS — J9601 Acute respiratory failure with hypoxia: Secondary | ICD-10-CM | POA: Diagnosis not present

## 2022-12-17 DIAGNOSIS — G9341 Metabolic encephalopathy: Secondary | ICD-10-CM | POA: Diagnosis not present

## 2022-12-17 NOTE — Progress Notes (Addendum)
Location:  [facility] Room Number: 15/A Place of Service:  SNF [phone]) Provider:  Octavia Heir, NP   Diane Quan, DO  Patient Care Team: Diane Quan, DO as PCP - General (Family Medicine) [name], Deloris Ping, MD as PCP - Cardiology (Cardiology)  Extended Emergency Contact Information Primary Emergency Contact: [name] Mobile Phone: [phone] Relation: Son Secondary Emergency Contact: [name] Address: [address]          [place], [place] Mobile Phone: [phone] Relation: Spouse  Code Status:  DNR Goals of care: Advanced Directive information    12/16/2022   10:09 AM  Advanced Directives  Does Patient Have a Medical Advance Directive? Yes  Type of Estate agent of [name];Living will  Does patient want to make changes to medical advance directive? No - Patient declined  Copy of Healthcare Power of Attorney in Chart? Yes - validated most recent copy scanned in chart (See row information)     Chief Complaint  Patient presents with   Acute Visit    tachycardia    HPI:  Pt is a 89 y.o. female seen today for acute visit due to tachycardia.   She currently resides on the skilled nursing unit at [facility]. PMH: diastolic heart failure, HTN, HLD, PVC's, interstitial lung disease, DOE, hypothyroidism, anxiety and unstable gait.   Nursing reports elevated HR 118. Recently hospitalized 11/26-12/02 due to multifocal pneumonia. Hospital EKG showed sinus rhythm with HR 77 and left atrial enlargement. She also had elevated BNP 776.2 and troponin 225> cardiology believed labs associated with heart failure ans aortic stenosis. She continues to be on various nebulizers for pneumonia. Currently on 2 liters oxygen at this time. She is short of breath with exertion. H/o anxiety, she is prescribed xanax prn. Apical HR 98 on exam. She admits to intermittent anxiety since being in SNF. Denies  chest pain, nausea, dizziness, or fatigue. Afebrile.   Recent HR readings per chart review:  12/04- 118, 115  12/03- 91, 100, 92  12/02- 74, 77  Past Surgical History:  Procedure Laterality Date   APPENDECTOMY     CHOLECYSTECTOMY     EXTRACORPOREAL SHOCK WAVE LITHOTRIPSY     EYE SURGERY Bilateral    cataract surgery with lens implant   ORIF HUMERUS FRACTURE Right 10/31/2013   Procedure: Open Reduction Internal Fixation Right Proximal Humerus Fracture;  Surgeon: Diane Manges, MD;  Location: [facility] OR;  Service: Orthopedics;  Laterality: Right;   TONSILLECTOMY     TOTAL ABDOMINAL HYSTERECTOMY      Allergies  Allergen Reactions   Diltiazem Hcl Er Beads Other (See Comments)    Dizziness (not present on the [facility] in 2024)    Outpatient Encounter Medications as of 12/17/2022  Medication Sig   acetaminophen (TYLENOL) 500 MG tablet Take 500 mg by mouth every 6 (six) hours as needed (for pain).   albuterol (PROVENTIL) (2.5 MG/3ML) 0.083% nebulizer solution Take 3 mLs (2.5 mg total) by nebulization every 6 (six) hours as needed for wheezing or shortness of breath.   albuterol (VENTOLIN HFA) 108 (90 Base) MCG/ACT inhaler Inhale 1 puff into the lungs every 4 (four) hours as needed for wheezing or shortness of breath.   ALPRAZolam (XANAX) 0.25 MG tablet Take 0.5 tablets (0.125 mg total) by mouth 2 (two) times daily as needed for anxiety.   arformoterol (BROVANA) 15 MCG/2ML NEBU Take 2 mLs (15 mcg total) by nebulization 2 (two)  times daily.   budesonide (PULMICORT) 0.25 MG/2ML nebulizer solution Take 2 mLs (0.25 mg total) by nebulization 2 (two) times daily.   cholecalciferol (VITAMIN D) 1000 units tablet Take 1,000 Units by mouth daily.   Emollient (AQUAPHOR HEALING BALM EX) Apply 1 application  topically See admin instructions. Apply to the lips 3 times a day   furosemide (LASIX) 20 MG tablet Take 1 tablet (20 mg total) by mouth daily.   guaiFENesin (MUCINEX) 600 MG 12 hr tablet Take 2 tablets  (1,200 mg total) by mouth 2 (two) times daily for 20 days.   ipratropium (ATROVENT) 0.02 % nebulizer solution Take 2.5 mLs (0.5 mg total) by nebulization 2 (two) times daily.   ketoconazole (NIZORAL) 2 % shampoo Apply 1 Application topically See admin instructions. Shampoo in the morning on Wednesdays and Saturdays   levalbuterol (XOPENEX) 0.63 MG/3ML nebulizer solution Take 3 mLs (0.63 mg total) by nebulization 2 (two) times daily.   levofloxacin (LEVAQUIN) 500 MG tablet Take 1 tablet (500 mg total) by mouth daily for 4 days.   levothyroxine (SYNTHROID) 75 MCG tablet Take 75 mcg by mouth daily before breakfast.   metoprolol tartrate (LOPRESSOR) 25 MG tablet Take 0.5 tablets (12.5 mg total) by mouth 2 (two) times daily.   Multiple Vitamins-Minerals (PRESERVISION AREDS 2+MULTI VIT) CAPS Take 1 capsule by mouth 2 (two) times daily.   nitroGLYCERIN (NITROSTAT) 0.4 MG SL tablet Place 0.4 mg under the tongue every 5 (five) minutes x 3 doses as needed for chest pain (call MD, if not effective).   pantoprazole (PROTONIX) 40 MG tablet Take 1 tablet (40 mg total) by mouth 2 (two) times daily.   potassium chloride SA (KLOR-CON M) 20 MEQ tablet Take 1 tablet (20 mEq total) by mouth daily.   pravastatin (PRAVACHOL) 20 MG tablet Take 20 mg by mouth every evening.   sertraline (ZOLOFT) 100 MG tablet Take 150 mg by mouth in the morning.   Zinc Oxide 20 % PSTE Apply topically. Apply to peri/buttocks topically as needed for After incontinent episodes as preventative unsupervised self-administration May keep at bedside   No facility-administered encounter medications on file as of 12/17/2022.    Review of Systems  Constitutional:  Negative for activity change and appetite change.  Respiratory:  Positive for cough and shortness of breath. Negative for wheezing.   Cardiovascular:  Negative for chest pain, palpitations and leg swelling.  Musculoskeletal:  Positive for gait problem.  Neurological:  Positive for  weakness.  Psychiatric/Behavioral:  Negative for dysphoric mood. The patient is nervous/anxious.     Immunization History  Administered Date(s) Administered   Influenza Split 09/15/2008, 10/11/2009, 09/09/2013, 11/17/2017, 10/29/2020   Influenza, High Dose Seasonal PF 10/15/2015, 10/13/2016, 11/17/2017, 10/28/2018   Moderna Sars-Covid-2 Vaccination 01/17/2019, 02/14/2019, 11/16/2019, 06/12/2020   Pfizer Covid-19 Vaccine Bivalent Booster 61yrs & up 10/02/2020   Pneumococcal Conjugate-13 08/01/2013   Pneumococcal Polysaccharide-23 03/05/1994   Td 06/26/2005   Tdap 01/27/2020   Zoster, Live 11/27/2011, 01/27/2020, 07/30/2020   Pertinent  Health Maintenance Due  Topic Date Due   DEXA SCAN  Never done   INFLUENZA VACCINE  08/14/2022      02/20/2018    3:53 PM 11/15/2020    3:16 PM 01/31/2021    5:46 PM  Fall Risk  (RETIRED) Patient Fall Risk Level Low fall risk High fall risk High fall risk   Functional Status Survey:    Vitals:   12/17/22 1629  BP: 127/71  Pulse: (!) 118  Resp: 18  Temp: 98.2 F (36.8 C)  SpO2: 94%  Weight: 132 lb (59.9 kg)  Height: 5\' 4"  (1.626 m)   Body mass index is 22.66 kg/m. Physical Exam Vitals reviewed.  Constitutional:      General: She is not in acute distress. HENT:     Head: Normocephalic.  Eyes:     General:        Right eye: No discharge.        Left eye: No discharge.  Cardiovascular:     Rate and Rhythm: Normal rate and regular rhythm.     Pulses: Normal pulses.     Heart sounds: Normal heart sounds. No murmur heard.    Comments: Apical pulse 98 Pulmonary:     Effort: Pulmonary effort is normal. No respiratory distress.     Breath sounds: Rhonchi present. No wheezing or rales.  Abdominal:     General: Bowel sounds are normal.     Palpations: Abdomen is soft.  Musculoskeletal:     Cervical back: Neck supple.     Right lower leg: No edema.     Left lower leg: No edema.  Skin:    General: Skin is warm.     Capillary Refill:  Capillary refill takes less than 2 seconds.  Neurological:     General: No focal deficit present.     Mental Status: She is alert and oriented to person, place, and time.     Motor: Weakness present.     Gait: Gait abnormal.  Psychiatric:        Mood and Affect: Mood is anxious.     Labs reviewed: Recent Labs    12/13/22 0432 12/14/22 0736 12/15/22 0401  NA 132* 135 135  K 3.1* 3.3* 3.6  CL 97* 98 101  CO2 24 27 24   GLUCOSE 103* 101* 99  BUN 22 16 16   CREATININE 0.79  0.76 0.70 0.71  CALCIUM 8.4* 8.4* 8.6*   Recent Labs    12/10/22 0424  AST 28  ALT 32  ALKPHOS 114  BILITOT 1.2*  PROT 7.1  ALBUMIN 2.6*   Recent Labs    12/13/22 0432 12/14/22 0736 12/15/22 0401  WBC 22.8* 22.0* 18.2*  HGB 9.7* 9.9* 9.4*  HCT 30.0* 30.4* 29.0*  MCV 99.7 97.7 100.0  PLT 441* 463* 462*   Lab Results  Component Value Date   TSH 0.877 02/24/2017   Lab Results  Component Value Date   HGBA1C (H) 10/02/2009    6.0 (NOTE)                                                                       According to the ADA Clinical Practice Recommendations for 2011, when HbA1c is used as a screening test:   >=6.5%   Diagnostic of Diabetes Mellitus           (if abnormal result  is confirmed)  5.7-6.4%   Increased risk of developing Diabetes Mellitus  References:Diagnosis and Classification of Diabetes Mellitus,Diabetes Care,2011,34(Suppl 1):S62-S69 and Standards of Medical Care in         Diabetes - 2011,Diabetes Care,2011,34  (Suppl 1):S11-S61.   Lab Results  Component Value Date   CHOL 184 02/24/2017   HDL 53 02/24/2017  LDLCALC 95 02/24/2017   TRIG 179 (H) 02/24/2017   CHOLHDL 3.5 02/24/2017    Significant Diagnostic Results in last 30 days:  ECHOCARDIOGRAM COMPLETE  Result Date: 12/11/2022    ECHOCARDIOGRAM REPORT   Patient Name:   [name] Date of Exam: 12/11/2022 Medical Rec #:  [id number]   Height:       64.0 in Accession #:    [id number]  Weight:       145.9 lb Date of  Birth:  1921/08/11   BSA:          1.711 m Patient Age:    89 years    BP:           137/55 mmHg Patient Gender: F           HR:           109 bpm. Exam Location:  Inpatient Procedure: 2D Echo, Cardiac Doppler and Color Doppler Indications:    I50.40* Unspecified combined systolic (congestive) and diastolic                 (congestive) heart failure  History:        Patient has prior history of Echocardiogram examinations, most                 recent 10/05/2015. CHF, Abnormal ECG, Arrythmias:Bradycardia,                 Signs/Symptoms:Shortness of Breath and Dyspnea; Risk                 Factors:Dyslipidemia and Hypertension.  Sonographer:    Sheralyn Boatman RDCS Referring Phys: [id number] SUBRINA SUNDIL IMPRESSIONS  1. Left ventricular ejection fraction, by estimation, is 60 to 65%. The left ventricle has normal function. The left ventricle has no regional wall motion abnormalities. There is mild left ventricular hypertrophy. Indeterminate diastolic filling due to E-A fusion. Elevated left ventricular end-diastolic pressure.  2. Right ventricular systolic function is normal. The right ventricular size is normal. There is normal pulmonary artery systolic pressure.  3. The mitral valve is abnormal. Trivial mitral valve regurgitation. Moderate mitral stenosis. The mean mitral valve gradient is 8.3 mmHg in the setting of tachycardia. Severe mitral annular calcification.  4. The aortic valve is calcified. There is severe calcifcation of the aortic valve. There is severe thickening of the aortic valve. Aortic valve regurgitation is not visualized. Severe aortic valve stenosis. Aortic valve area, by VTI measures 0.74 cm. Aortic valve mean gradient measures 38.0 mmHg. Aortic valve Vmax measures 4.10 m/s. DVI is 0.27.  5. The inferior vena cava is normal in size with greater than 50% respiratory variability, suggesting right atrial pressure of 3 mmHg. Comparison(s): No prior Echocardiogram. Severe aortic stenosis and moderate mitral  stenosis. FINDINGS  Left Ventricle: Left ventricular ejection fraction, by estimation, is 60 to 65%. The left ventricle has normal function. The left ventricle has no regional wall motion abnormalities. The left ventricular internal cavity size was normal in size. There is  mild left ventricular hypertrophy. Indeterminate diastolic filling due to E-A fusion. Elevated left ventricular end-diastolic pressure. Right Ventricle: The right ventricular size is normal. No increase in right ventricular wall thickness. Right ventricular systolic function is normal. There is normal pulmonary artery systolic pressure. The tricuspid regurgitant velocity is 2.33 m/s, and  with an assumed right atrial pressure of 3 mmHg, the estimated right ventricular systolic pressure is 24.7 mmHg. Left Atrium: Left atrial size was normal in size. Right Atrium: Right  atrial size was normal in size. Pericardium: There is no evidence of pericardial effusion. Mitral Valve: The mitral valve is abnormal. Severe mitral annular calcification. Trivial mitral valve regurgitation. Moderate mitral valve stenosis. MV peak gradient, 15.6 mmHg. The mean mitral valve gradient is 8.3 mmHg. Tricuspid Valve: The tricuspid valve is normal in structure. Tricuspid valve regurgitation is mild . No evidence of tricuspid stenosis. Aortic Valve: The aortic valve is calcified. There is severe calcifcation of the aortic valve. There is severe thickening of the aortic valve. Aortic valve regurgitation is not visualized. Severe aortic stenosis is present. Aortic valve mean gradient measures 38.0 mmHg. Aortic valve peak gradient measures 67.1 mmHg. Aortic valve area, by VTI measures 0.74 cm. Pulmonic Valve: The pulmonic valve was normal in structure. Pulmonic valve regurgitation is mild. No evidence of pulmonic stenosis. Aorta: The aortic root and ascending aorta are structurally normal, with no evidence of dilitation. Venous: The inferior vena cava is normal in size with  greater than 50% respiratory variability, suggesting right atrial pressure of 3 mmHg. IAS/Shunts: No atrial level shunt detected by color flow Doppler.  LEFT VENTRICLE PLAX 2D LVIDd:         3.35 cm     Diastology LVIDs:         2.50 cm     LV e' medial:    11.40 cm/s LV PW:         1.55 cm     LV E/e' medial:  15.1 LV IVS:        1.10 cm     LV e' lateral:   4.13 cm/s LVOT diam:     2.15 cm     LV E/e' lateral: 41.6 LV SV:         58 LV SV Index:   34 LVOT Area:     3.63 cm  LV Volumes (MOD) LV vol d, MOD A2C: 69.7 ml LV vol d, MOD A4C: 51.6 ml LV vol s, MOD A2C: 26.7 ml LV vol s, MOD A4C: 19.4 ml LV SV MOD A2C:     43.0 ml LV SV MOD A4C:     51.6 ml LV SV MOD BP:      36.6 ml RIGHT VENTRICLE            IVC RV S prime:     9.68 cm/s  IVC diam: 1.80 cm TAPSE (M-mode): 1.9 cm LEFT ATRIUM             Index        RIGHT ATRIUM           Index LA diam:        3.40 cm 1.99 cm/m   RA Area:     10.80 cm LA Vol (A2C):   41.4 ml 24.19 ml/m  RA Volume:   19.50 ml  11.40 ml/m LA Vol (A4C):   25.1 ml 14.67 ml/m LA Biplane Vol: 35.5 ml 20.75 ml/m  AORTIC VALVE                     PULMONIC VALVE AV Area (Vmax):    0.98 cm      PR End Diast Vel: 1.51 msec AV Area (Vmean):   0.94 cm AV Area (VTI):     0.74 cm AV Vmax:           409.50 cm/s AV Vmean:          281.000 cm/s AV VTI:  0.783 m AV Peak Grad:      67.1 mmHg AV Mean Grad:      38.0 mmHg LVOT Vmax:         110.00 cm/s LVOT Vmean:        72.700 cm/s LVOT VTI:          0.160 m LVOT/AV VTI ratio: 0.20  AORTA Ao Root diam: 3.20 cm Ao Asc diam:  3.00 cm MITRAL VALVE                TRICUSPID VALVE MV Area (PHT): 4.39 cm     TR Peak grad:   21.7 mmHg MV Area VTI:   1.95 cm     TR Vmax:        233.00 cm/s MV Peak grad:  15.6 mmHg MV Mean grad:  8.3 mmHg     SHUNTS MV Vmax:       1.97 m/s     Systemic VTI:  0.16 m MV Vmean:      132.3 cm/s   Systemic Diam: 2.15 cm MV Decel Time: 173 msec MV E velocity: 172.00 cm/s Vishnu Priya Mallipeddi Electronically signed by  Winfield Rast Mallipeddi Signature Date/Time: 12/11/2022/12:43:31 PM    Final    DG Chest Port 1 View  Result Date: 12/11/2022 CLINICAL DATA:  [id number], [id number]. Chest pain and shortness of breath, fever. EXAM: PORTABLE CHEST 1 VIEW COMPARISON:  Portable chest yesterday at 3:37 a.m. FINDINGS: 2:38 a.m. There is mild cardiomegaly. Central vascular prominence and mild interstitial edema continue to be seen with small pleural effusions. There are increased bilateral perihilar opacities which could be alveolar edema or pneumonia, with persistent patchy airspace disease in the right upper lobe which probably is due to pneumonia. Findings are superimposed on chronic interstitial lung disease. The mediastinum is stable. There is aortic atherosclerosis.  No new osseous findings. Thoracic spondylosis.  Chronic right humeral ORIF. IMPRESSION: 1. Increased bilateral perihilar opacities which could be alveolar edema or pneumonia, with persistent patchy airspace disease in the right upper lobe which probably is due to pneumonia. 2. Findings are superimposed on chronic interstitial lung disease. 3. Cardiomegaly with central vascular prominence and mild interstitial edema. 4. Small pleural effusions. 5. Aortic atherosclerosis. Electronically Signed   By: Almira Bar M.D.   On: 12/11/2022 04:30   DG CHEST PORT 1 VIEW  Result Date: 12/10/2022 CLINICAL DATA:  Respiratory compromise. Increased shortness of breath and hypoxia EXAM: PORTABLE CHEST 1 VIEW COMPARISON:  12/09/2022 FINDINGS: Chronic lung disease with diffuse interstitial reticulation. Progression of patchy bilateral airspace disease especially at the right apex. No effusion or pneumothorax. Stable heart size and mediastinal contours. IMPRESSION: Worsening pneumonia superimposed on interstitial lung disease. Electronically Signed   By: Tiburcio Pea M.D.   On: 12/10/2022 04:29   DG Chest Port 1 View  Result Date: 12/09/2022 CLINICAL DATA:  Three day history  of productive cough, shortness of breath, decreased appetite EXAM: PORTABLE CHEST 1 VIEW COMPARISON:  Chest radiograph dated 01/31/2021, CT chest dated 07/22/2021 FINDINGS: Normal lung volumes. Increased bilateral interstitial and patchy opacities. Blunting of the bilateral costophrenic angles. No pneumothorax. The heart size and mediastinal contours are within normal limits. Partially imaged right proximal humeral fixation hardware. IMPRESSION: 1. Increased bilateral interstitial and patchy opacities, which may represent pulmonary edema or multifocal pneumonia. 2. Blunting of the bilateral costophrenic angles, which may represent small pleural effusions. Electronically Signed   By: Agustin Cree M.D.   On: 12/09/2022 16:51    Assessment/Plan  1. Tachycardia - HR 118, 115 per nursing  - apical pulse 98 on exam - recently hospitalized for PNA - EKG was sinus rhythm with HR 77 with left atrium enlargement - suspect shortness of breath from PNA, anxiety and nebulizers as contributing factors - advised nursing to check HR TID x 7 days - consider repeat EKG if HR continues to be elevated  - cont metoprolol  2. Multifocal pneumonia - hospitalized 11/26-12/02> WBC 18 on admission> given IV vancomycin and Zosyn - cont oxygen - cont Levaquin  - cont nebs (albuterol/ atrovent/ xopenex), Brovana and Pulmicort - cont Acapella  3. Anxiety - ongoing - anxious today - cont xanax prn    Family/ staff Communication: plan discussed with patient and nurse  Labs/tests ordered:  none

## 2022-12-18 ENCOUNTER — Non-Acute Institutional Stay (SKILLED_NURSING_FACILITY): Payer: Self-pay | Admitting: Internal Medicine

## 2022-12-18 DIAGNOSIS — R000 Tachycardia, unspecified: Secondary | ICD-10-CM | POA: Diagnosis not present

## 2022-12-18 DIAGNOSIS — G9341 Metabolic encephalopathy: Secondary | ICD-10-CM | POA: Diagnosis not present

## 2022-12-18 DIAGNOSIS — I493 Ventricular premature depolarization: Secondary | ICD-10-CM | POA: Diagnosis not present

## 2022-12-18 DIAGNOSIS — J849 Interstitial pulmonary disease, unspecified: Secondary | ICD-10-CM

## 2022-12-18 DIAGNOSIS — M6281 Muscle weakness (generalized): Secondary | ICD-10-CM | POA: Diagnosis not present

## 2022-12-18 DIAGNOSIS — R1312 Dysphagia, oropharyngeal phase: Secondary | ICD-10-CM | POA: Diagnosis not present

## 2022-12-18 DIAGNOSIS — F419 Anxiety disorder, unspecified: Secondary | ICD-10-CM | POA: Diagnosis not present

## 2022-12-18 DIAGNOSIS — D649 Anemia, unspecified: Secondary | ICD-10-CM | POA: Diagnosis not present

## 2022-12-18 DIAGNOSIS — J189 Pneumonia, unspecified organism: Secondary | ICD-10-CM | POA: Diagnosis not present

## 2022-12-18 DIAGNOSIS — N179 Acute kidney failure, unspecified: Secondary | ICD-10-CM | POA: Diagnosis not present

## 2022-12-18 DIAGNOSIS — I5033 Acute on chronic diastolic (congestive) heart failure: Secondary | ICD-10-CM

## 2022-12-18 DIAGNOSIS — R41841 Cognitive communication deficit: Secondary | ICD-10-CM | POA: Diagnosis not present

## 2022-12-18 DIAGNOSIS — E039 Hypothyroidism, unspecified: Secondary | ICD-10-CM

## 2022-12-18 DIAGNOSIS — J9601 Acute respiratory failure with hypoxia: Secondary | ICD-10-CM | POA: Diagnosis not present

## 2022-12-18 DIAGNOSIS — R2681 Unsteadiness on feet: Secondary | ICD-10-CM | POA: Diagnosis not present

## 2022-12-18 NOTE — Progress Notes (Signed)
Provider:   Location:  [facility]gallery curator of Service:  SNF (31)  PCP: Shireen Quan, DO Patient Care Team: Shireen Quan, DO as PCP - General (Family Medicine) [name], Deloris Ping, MD as PCP - Cardiology (Cardiology)  Extended Emergency Contact Information Primary Emergency Contact: Bonenfant,[name] Mobile Phone: [phone] Relation: Son Secondary Emergency Contact: [name] Address: [address]          [place], [place] Mobile Phone: [phone] Relation: Spouse  Code Status: DNR Goals of Care: Advanced Directive information    12/16/2022   10:09 AM  Advanced Directives  Does Patient Have a Medical Advance Directive? Yes  Type of Estate agent of [place];Living will  Does patient want to make changes to medical advance directive? No - Patient declined  Copy of Healthcare Power of Attorney in Chart? Yes - validated most recent copy scanned in chart (See row information)      Chief Complaint  Patient presents with   New Admit To SNF    HPI: Patient is a 89 y.o. female seen today for admission to SNF  Admitted in the hospital from 11/26 to 12/2  Patient has a history of diastolic CHF, hypertension, ILD, hypothyroidism She lives [place] in [facility] Patient went to her PCP with complaint of cough and shortness of breath and weakness.  COVID and flu were negative She was sent to ED where she was found to be hypotensive tachypneic.  With leukocytosis x-ray showed bilateral patchy opacity possible multifocal pneumonia versus pulmonary edema She was started on broad antibiotics Her BNP was also high so she was given some Lasix Troponins were also elevated at 238 She she was seen by cardiology which recommended 2D echo which showed normal wall motion abnormality but patient has severe AS  No further workup is recommended  Since been here patient has been very anxious and tearful Using 2 L of oxygen is short of  breath with exertion Sometimes refusing therapy she feels weak .  Continues to have some cough and shortness of breath.   Past Medical History:  Diagnosis Date   Anxiety    Aortic stenosis    a. mild by echo 2017.   Arthritis    Bradycardia    a. ? details not clear - in the past has had PVCs which have lowered effective HR count by pulse check.   Chronic diastolic CHF (congestive heart failure) (HCC)    Diastolic dysfunction    mild    Frequent PVCs    a. 48 Hour Holter 10/08/15 showed NSR and sinus tach with frequent PVCs, occasional bigeminy, total PVC burden 16.5%, lowest HR 59.   Hyperlipidemia    Hypertension    Hypertensive heart disease    Hypothyroidism    Kidney stones    Mild mitral regurgitation by prior echocardiogram    Tachycardia    history of tachycardia and bradycardia   Past Surgical History:  Procedure Laterality Date   APPENDECTOMY     CHOLECYSTECTOMY     EXTRACORPOREAL SHOCK WAVE LITHOTRIPSY     EYE SURGERY Bilateral    cataract surgery with lens implant   ORIF HUMERUS FRACTURE Right 10/31/2013   Procedure: Open Reduction Internal Fixation Right Proximal Humerus Fracture;  Surgeon: Eldred Manges, MD;  Location: [facility] OR;  Service: Orthopedics;  Laterality: Right;   TONSILLECTOMY     TOTAL ABDOMINAL HYSTERECTOMY      reports that she has  never smoked. She has never used smokeless tobacco. She reports that she does not drink alcohol and does not use drugs. Social History   Socioeconomic History   Marital status: Married    Spouse name: Not on file   Number of children: Not on file   Years of education: Not on file   Highest education level: Not on file  Occupational History   Not on file  Tobacco Use   Smoking status: Never   Smokeless tobacco: Never  Vaping Use   Vaping status: Never Used  Substance and Sexual Activity   Alcohol use: No   Drug use: No   Sexual activity: Not Currently  Other Topics Concern   Not on file  Social History  Narrative   Not on file   Social Determinants of Health   Financial Resource Strain: Not on file  Food Insecurity: No Food Insecurity (12/10/2022)   Hunger Vital Sign    Worried About Running Out of Food in the Last Year: Never true    Ran Out of Food in the Last Year: Never true  Transportation Needs: No Transportation Needs (12/10/2022)   PRAPARE - Administrator, Civil Service (Medical): No    Lack of Transportation (Non-Medical): No  Physical Activity: Not on file  Stress: Not on file  Social Connections: Not on file  Intimate Partner Violence: Not At Risk (12/10/2022)   Humiliation, Afraid, Rape, and Kick questionnaire    Fear of Current or Ex-Partner: No    Emotionally Abused: No    Physically Abused: No    Sexually Abused: No    Functional Status Survey:    Family History  Problem Relation Age of Onset   Emphysema Father    Heart failure Mother    Heart attack Brother    Cancer Brother        kidney   Stroke Brother     Health Maintenance  Topic Date Due   Zoster Vaccines- Shingrix (1 of 2) 05/23/1973   DEXA SCAN  Never done   INFLUENZA VACCINE  08/14/2022   COVID-19 Vaccine (6 - 2023-24 season) 09/14/2022   Medicare Annual Wellness (AWV)  02/04/2023   DTaP/Tdap/Td (3 - Td or Tdap) 01/26/2030   Pneumonia Vaccine 32+ Years old  Completed   HPV VACCINES  Aged Out    Allergies  Allergen Reactions   Diltiazem Hcl Er Beads Other (See Comments)    Dizziness (not present on the [facility] in 2024)    Outpatient Encounter Medications as of 12/18/2022  Medication Sig   acetaminophen (TYLENOL) 500 MG tablet Take 500 mg by mouth every 6 (six) hours as needed (for pain).   albuterol (PROVENTIL) (2.5 MG/3ML) 0.083% nebulizer solution Take 3 mLs (2.5 mg total) by nebulization every 6 (six) hours as needed for wheezing or shortness of breath.   albuterol (VENTOLIN HFA) 108 (90 Base) MCG/ACT inhaler Inhale 1 puff into the lungs every 4 (four) hours as needed for  wheezing or shortness of breath.   ALPRAZolam (XANAX) 0.25 MG tablet Take 0.5 tablets (0.125 mg total) by mouth 2 (two) times daily as needed for anxiety.   arformoterol (BROVANA) 15 MCG/2ML NEBU Take 2 mLs (15 mcg total) by nebulization 2 (two) times daily.   budesonide (PULMICORT) 0.25 MG/2ML nebulizer solution Take 2 mLs (0.25 mg total) by nebulization 2 (two) times daily.   cholecalciferol (VITAMIN D) 1000 units tablet Take 1,000 Units by mouth daily.   Emollient (AQUAPHOR HEALING BALM EX)  Apply 1 application  topically See admin instructions. Apply to the lips 3 times a day   furosemide (LASIX) 20 MG tablet Take 1 tablet (20 mg total) by mouth daily.   guaiFENesin (MUCINEX) 600 MG 12 hr tablet Take 2 tablets (1,200 mg total) by mouth 2 (two) times daily for 20 days.   ipratropium (ATROVENT) 0.02 % nebulizer solution Take 2.5 mLs (0.5 mg total) by nebulization 2 (two) times daily.   ketoconazole (NIZORAL) 2 % shampoo Apply 1 Application topically See admin instructions. Shampoo in the morning on Wednesdays and Saturdays   levalbuterol (XOPENEX) 0.63 MG/3ML nebulizer solution Take 3 mLs (0.63 mg total) by nebulization 2 (two) times daily.   levofloxacin (LEVAQUIN) 500 MG tablet Take 1 tablet (500 mg total) by mouth daily for 4 days.   levothyroxine (SYNTHROID) 75 MCG tablet Take 75 mcg by mouth daily before breakfast.   metoprolol tartrate (LOPRESSOR) 25 MG tablet Take 0.5 tablets (12.5 mg total) by mouth 2 (two) times daily.   Multiple Vitamins-Minerals (PRESERVISION AREDS 2+MULTI VIT) CAPS Take 1 capsule by mouth 2 (two) times daily.   nitroGLYCERIN (NITROSTAT) 0.4 MG SL tablet Place 0.4 mg under the tongue every 5 (five) minutes x 3 doses as needed for chest pain (call MD, if not effective).   pantoprazole (PROTONIX) 40 MG tablet Take 1 tablet (40 mg total) by mouth 2 (two) times daily.   potassium chloride SA (KLOR-CON M) 20 MEQ tablet Take 1 tablet (20 mEq total) by mouth daily.    pravastatin (PRAVACHOL) 20 MG tablet Take 20 mg by mouth every evening.   sertraline (ZOLOFT) 100 MG tablet Take 150 mg by mouth in the morning.   Zinc Oxide 20 % PSTE Apply topically. Apply to peri/buttocks topically as needed for After incontinent episodes as preventative unsupervised self-administration May keep at bedside   No facility-administered encounter medications on file as of 12/18/2022.    Review of Systems  Constitutional:  Positive for activity change. Negative for appetite change.  HENT: Negative.    Respiratory:  Positive for cough and shortness of breath.   Cardiovascular:  Positive for palpitations. Negative for leg swelling.  Gastrointestinal:  Positive for constipation.  Genitourinary: Negative.   Musculoskeletal:  Positive for gait problem. Negative for arthralgias and myalgias.  Skin: Negative.   Neurological:  Positive for weakness. Negative for dizziness.  Psychiatric/Behavioral:  Positive for dysphoric mood and sleep disturbance. Negative for confusion. The patient is nervous/anxious.     Vitals:   12/18/22 1049  BP: 104/60  Pulse: 96  Resp: 20  Temp: (!) 97.4 F (36.3 C)  Weight: 132 lb (59.9 kg)   Body mass index is 22.66 kg/m. Physical Exam Vitals reviewed.  Constitutional:      Appearance: Normal appearance.  HENT:     Head: Normocephalic.     Nose: Nose normal.     Mouth/Throat:     Mouth: Mucous membranes are moist.     Pharynx: Oropharynx is clear.  Eyes:     Pupils: Pupils are equal, round, and reactive to light.  Cardiovascular:     Rate and Rhythm: Regular rhythm. Tachycardia present.     Pulses: Normal pulses.     Heart sounds: Murmur heard.  Pulmonary:     Effort: Pulmonary effort is normal.     Breath sounds: Normal breath sounds.     Comments: Rales in Bases Bilateral Abdominal:     General: Abdomen is flat. Bowel sounds are normal.  Palpations: Abdomen is soft.  Musculoskeletal:        General: No swelling.      Cervical back: Neck supple.  Skin:    General: Skin is warm.  Neurological:     General: No focal deficit present.     Mental Status: She is alert and oriented to person, place, and time.  Psychiatric:        Mood and Affect: Mood normal.        Thought Content: Thought content normal.     Labs reviewed: Basic Metabolic Panel: Recent Labs    12/13/22 0432 12/14/22 0736 12/15/22 0401  NA 132* 135 135  K 3.1* 3.3* 3.6  CL 97* 98 101  CO2 24 27 24   GLUCOSE 103* 101* 99  BUN 22 16 16   CREATININE 0.79  0.76 0.70 0.71  CALCIUM 8.4* 8.4* 8.6*   Liver Function Tests: Recent Labs    12/10/22 0424  AST 28  ALT 32  ALKPHOS 114  BILITOT 1.2*  PROT 7.1  ALBUMIN 2.6*   No results for input(s): "LIPASE", "AMYLASE" in the last 8760 hours. No results for input(s): "AMMONIA" in the last 8760 hours. CBC: Recent Labs    12/13/22 0432 12/14/22 0736 12/15/22 0401  WBC 22.8* 22.0* 18.2*  HGB 9.7* 9.9* 9.4*  HCT 30.0* 30.4* 29.0*  MCV 99.7 97.7 100.0  PLT 441* 463* 462*   Cardiac Enzymes: No results for input(s): "CKTOTAL", "CKMB", "CKMBINDEX", "TROPONINI" in the last 8760 hours. BNP: Invalid input(s): "POCBNP" Lab Results  Component Value Date   HGBA1C (H) 10/02/2009    6.0 (NOTE)                                                                       According to the ADA Clinical Practice Recommendations for 2011, when HbA1c is used as a screening test:   >=6.5%   Diagnostic of Diabetes Mellitus           (if abnormal result  is confirmed)  5.7-6.4%   Increased risk of developing Diabetes Mellitus  References:Diagnosis and Classification of Diabetes Mellitus,Diabetes Care,2011,34(Suppl 1):S62-S69 and Standards of Medical Care in         Diabetes - 2011,Diabetes Care,2011,34  (Suppl 1):S11-S61.   Lab Results  Component Value Date   TSH 0.877 02/24/2017   No results found for: "VITAMINB12" No results found for: "FOLATE" No results found for: "IRON", "TIBC",  "FERRITIN"  Imaging and Procedures obtained prior to SNF admission: DG CHEST PORT 1 VIEW  Result Date: 12/10/2022 CLINICAL DATA:  Respiratory compromise. Increased shortness of breath and hypoxia EXAM: PORTABLE CHEST 1 VIEW COMPARISON:  12/09/2022 FINDINGS: Chronic lung disease with diffuse interstitial reticulation. Progression of patchy bilateral airspace disease especially at the right apex. No effusion or pneumothorax. Stable heart size and mediastinal contours. IMPRESSION: Worsening pneumonia superimposed on interstitial lung disease. Electronically Signed   By: Tiburcio Pea M.D.   On: 12/10/2022 04:29   DG Chest Port 1 View  Result Date: 12/09/2022 CLINICAL DATA:  Three day history of productive cough, shortness of breath, decreased appetite EXAM: PORTABLE CHEST 1 VIEW COMPARISON:  Chest radiograph dated 01/31/2021, CT chest dated 07/22/2021 FINDINGS: Normal lung volumes. Increased bilateral interstitial and patchy opacities. Blunting  of the bilateral costophrenic angles. No pneumothorax. The heart size and mediastinal contours are within normal limits. Partially imaged right proximal humeral fixation hardware. IMPRESSION: 1. Increased bilateral interstitial and patchy opacities, which may represent pulmonary edema or multifocal pneumonia. 2. Blunting of the bilateral costophrenic angles, which may represent small pleural effusions. Electronically Signed   By: Agustin Cree M.D.   On: 12/09/2022 16:51    Assessment/Plan 1. Multifocal pneumonia Continues with Oxygen Finishing her Levaquin Also started Therapy  2. Tachycardia Discontinue Albuterol and Do Xopenox Prn EKG shows Sinus Rythm Already on Metoprolol Will Increase the dose if needed She is also very anxious  3. Anxiety Increase her Xanxa to Q8 PRN Also on Zoloft 4. Acute on chronic diastolic congestive heart failure (HCC) Continue Lasix Follow renal Fucntion And Weight  5. AKI (acute kidney injury) (HCC) Creat  pending  6. Interstitial lung disease (HCC) Per Pumonary Conservative management  7. Hypothyroidism, unspecified type Follows with PCP  8. Frequent PVCs On Metoprolol 9 HLD On statin 10 AS Per cardiology conservative management   Family/ staff Communication:   Labs/tests ordered:

## 2022-12-19 DIAGNOSIS — G9341 Metabolic encephalopathy: Secondary | ICD-10-CM | POA: Diagnosis not present

## 2022-12-19 DIAGNOSIS — J9601 Acute respiratory failure with hypoxia: Secondary | ICD-10-CM | POA: Diagnosis not present

## 2022-12-19 DIAGNOSIS — R41841 Cognitive communication deficit: Secondary | ICD-10-CM | POA: Diagnosis not present

## 2022-12-19 DIAGNOSIS — M6281 Muscle weakness (generalized): Secondary | ICD-10-CM | POA: Diagnosis not present

## 2022-12-19 DIAGNOSIS — R2681 Unsteadiness on feet: Secondary | ICD-10-CM | POA: Diagnosis not present

## 2022-12-19 DIAGNOSIS — R1312 Dysphagia, oropharyngeal phase: Secondary | ICD-10-CM | POA: Diagnosis not present

## 2022-12-22 DIAGNOSIS — R2681 Unsteadiness on feet: Secondary | ICD-10-CM | POA: Diagnosis not present

## 2022-12-22 DIAGNOSIS — R41841 Cognitive communication deficit: Secondary | ICD-10-CM | POA: Diagnosis not present

## 2022-12-22 DIAGNOSIS — R1312 Dysphagia, oropharyngeal phase: Secondary | ICD-10-CM | POA: Diagnosis not present

## 2022-12-22 DIAGNOSIS — M6281 Muscle weakness (generalized): Secondary | ICD-10-CM | POA: Diagnosis not present

## 2022-12-22 DIAGNOSIS — G9341 Metabolic encephalopathy: Secondary | ICD-10-CM | POA: Diagnosis not present

## 2022-12-22 DIAGNOSIS — J9601 Acute respiratory failure with hypoxia: Secondary | ICD-10-CM | POA: Diagnosis not present

## 2022-12-23 DIAGNOSIS — R2681 Unsteadiness on feet: Secondary | ICD-10-CM | POA: Diagnosis not present

## 2022-12-23 DIAGNOSIS — G9341 Metabolic encephalopathy: Secondary | ICD-10-CM | POA: Diagnosis not present

## 2022-12-23 DIAGNOSIS — R1312 Dysphagia, oropharyngeal phase: Secondary | ICD-10-CM | POA: Diagnosis not present

## 2022-12-23 DIAGNOSIS — M6281 Muscle weakness (generalized): Secondary | ICD-10-CM | POA: Diagnosis not present

## 2022-12-23 DIAGNOSIS — J9601 Acute respiratory failure with hypoxia: Secondary | ICD-10-CM | POA: Diagnosis not present

## 2022-12-23 DIAGNOSIS — R41841 Cognitive communication deficit: Secondary | ICD-10-CM | POA: Diagnosis not present

## 2022-12-24 DIAGNOSIS — G9341 Metabolic encephalopathy: Secondary | ICD-10-CM | POA: Diagnosis not present

## 2022-12-24 DIAGNOSIS — R2681 Unsteadiness on feet: Secondary | ICD-10-CM | POA: Diagnosis not present

## 2022-12-24 DIAGNOSIS — J9601 Acute respiratory failure with hypoxia: Secondary | ICD-10-CM | POA: Diagnosis not present

## 2022-12-24 DIAGNOSIS — R1312 Dysphagia, oropharyngeal phase: Secondary | ICD-10-CM | POA: Diagnosis not present

## 2022-12-24 DIAGNOSIS — M6281 Muscle weakness (generalized): Secondary | ICD-10-CM | POA: Diagnosis not present

## 2022-12-24 DIAGNOSIS — R41841 Cognitive communication deficit: Secondary | ICD-10-CM | POA: Diagnosis not present

## 2022-12-25 ENCOUNTER — Non-Acute Institutional Stay (SKILLED_NURSING_FACILITY): Payer: Medicare Other | Admitting: Internal Medicine

## 2022-12-25 DIAGNOSIS — F419 Anxiety disorder, unspecified: Secondary | ICD-10-CM | POA: Diagnosis not present

## 2022-12-25 DIAGNOSIS — R2681 Unsteadiness on feet: Secondary | ICD-10-CM | POA: Diagnosis not present

## 2022-12-25 DIAGNOSIS — G9341 Metabolic encephalopathy: Secondary | ICD-10-CM | POA: Diagnosis not present

## 2022-12-25 DIAGNOSIS — J189 Pneumonia, unspecified organism: Secondary | ICD-10-CM | POA: Diagnosis not present

## 2022-12-25 DIAGNOSIS — J9601 Acute respiratory failure with hypoxia: Secondary | ICD-10-CM | POA: Diagnosis not present

## 2022-12-25 DIAGNOSIS — N179 Acute kidney failure, unspecified: Secondary | ICD-10-CM

## 2022-12-25 DIAGNOSIS — E039 Hypothyroidism, unspecified: Secondary | ICD-10-CM | POA: Diagnosis not present

## 2022-12-25 DIAGNOSIS — M6281 Muscle weakness (generalized): Secondary | ICD-10-CM | POA: Diagnosis not present

## 2022-12-25 DIAGNOSIS — R1312 Dysphagia, oropharyngeal phase: Secondary | ICD-10-CM | POA: Diagnosis not present

## 2022-12-25 DIAGNOSIS — R41841 Cognitive communication deficit: Secondary | ICD-10-CM | POA: Diagnosis not present

## 2022-12-25 DIAGNOSIS — I5033 Acute on chronic diastolic (congestive) heart failure: Secondary | ICD-10-CM

## 2022-12-25 DIAGNOSIS — R000 Tachycardia, unspecified: Secondary | ICD-10-CM | POA: Diagnosis not present

## 2022-12-25 DIAGNOSIS — J849 Interstitial pulmonary disease, unspecified: Secondary | ICD-10-CM

## 2022-12-25 NOTE — Progress Notes (Unsigned)
[facility]: [facility] of Service:  SNF (31)  Provider:   Code Status: DNR Goals of Care:     12/16/2022   10:09 AM  Advanced Directives  Does Patient Have a Medical Advance Directive? Yes  Type of Estate agent of [place];Living will  Does patient want to make changes to medical advance directive? No - Patient declined  Copy of Healthcare Power of Attorney in Chart? Yes - validated most recent copy scanned in chart (See row information)     Chief Complaint  Patient presents with  . Acute Visit    HPI: Patient is a 89 y.o. female seen today for an acute visit for Weakness and Not making Progress  Patient was admitted in the hospital from 11/26 to 12/2  Patient has a history of diastolic CHF, hypertension, ILD, hypothyroidism She lives in [place] in [facility]  Husband had lots of concerns on her recovery   Past Medical History:  Diagnosis Date  . Anxiety   . Aortic stenosis    a. mild by echo 2017.  . Arthritis   . Bradycardia    a. ? details not clear - in the past has had PVCs which have lowered effective HR count by pulse check.  . Chronic diastolic CHF (congestive heart failure) (HCC)   . Diastolic dysfunction    mild   . Frequent PVCs    a. 48 Hour Holter 10/08/15 showed NSR and sinus tach with frequent PVCs, occasional bigeminy, total PVC burden 16.5%, lowest HR 59.  [name] Hyperlipidemia   . Hypertension   . Hypertensive heart disease   . Hypothyroidism   . Kidney stones   . Mild mitral regurgitation by prior echocardiogram   . Tachycardia    history of tachycardia and bradycardia    Past Surgical History:  Procedure Laterality Date  . APPENDECTOMY    . CHOLECYSTECTOMY    . EXTRACORPOREAL SHOCK WAVE LITHOTRIPSY    . EYE SURGERY Bilateral    cataract surgery with lens implant  . ORIF HUMERUS FRACTURE Right 10/31/2013   Procedure: Open Reduction Internal Fixation Right Proximal Humerus Fracture;  Surgeon: Eldred Manges, MD;   Location: [facility] OR;  Service: Orthopedics;  Laterality: Right;  . TONSILLECTOMY    . TOTAL ABDOMINAL HYSTERECTOMY      Allergies  Allergen Reactions  . Diltiazem Hcl Er Beads Other (See Comments)    Dizziness (not present on the [facility] in 2024)    Outpatient Encounter Medications as of 12/25/2022  Medication Sig  . acetaminophen (TYLENOL) 500 MG tablet Take 500 mg by mouth every 6 (six) hours as needed (for pain).  [name] albuterol (VENTOLIN HFA) 108 (90 Base) MCG/ACT inhaler Inhale 1 puff into the lungs every 4 (four) hours as needed for wheezing or shortness of breath.  . ALPRAZolam (XANAX) 0.25 MG tablet Take 0.5 tablets (0.125 mg total) by mouth 2 (two) times daily as needed for anxiety. (Patient taking differently: Take 0.125 mg by mouth 3 (three) times daily as needed for anxiety.)  . arformoterol (BROVANA) 15 MCG/2ML NEBU Take 2 mLs (15 mcg total) by nebulization 2 (two) times daily.  . budesonide (PULMICORT) 0.25 MG/2ML nebulizer solution Take 2 mLs (0.25 mg total) by nebulization 2 (two) times daily.  . cholecalciferol (VITAMIN D) 1000 units tablet Take 1,000 Units by mouth daily.  . Emollient (AQUAPHOR HEALING BALM EX) Apply 1 application  topically See admin instructions. Apply to the lips 3 times a day  .  furosemide (LASIX) 20 MG tablet Take 1 tablet (20 mg total) by mouth daily.  [name] guaiFENesin (MUCINEX) 600 MG 12 hr tablet Take 2 tablets (1,200 mg total) by mouth 2 (two) times daily for 20 days.  [name] ipratropium (ATROVENT) 0.02 % nebulizer solution Take 2.5 mLs (0.5 mg total) by nebulization 2 (two) times daily.  [name] ketoconazole (NIZORAL) 2 % shampoo Apply 1 Application topically See admin instructions. Shampoo in the morning on Wednesdays and Saturdays  . levalbuterol (XOPENEX) 0.63 MG/3ML nebulizer solution Take 3 mLs (0.63 mg total) by nebulization 2 (two) times daily.  [name] levothyroxine (SYNTHROID) 75 MCG tablet Take 75 mcg by mouth daily before breakfast.  . metoprolol tartrate  (LOPRESSOR) 25 MG tablet Take 0.5 tablets (12.5 mg total) by mouth 2 (two) times daily.  . Multiple Vitamins-Minerals (PRESERVISION AREDS 2+MULTI VIT) CAPS Take 1 capsule by mouth 2 (two) times daily.  . nitroGLYCERIN (NITROSTAT) 0.4 MG SL tablet Place 0.4 mg under the tongue every 5 (five) minutes x 3 doses as needed for chest pain (call MD, if not effective).  . pantoprazole (PROTONIX) 40 MG tablet Take 1 tablet (40 mg total) by mouth 2 (two) times daily.  . potassium chloride SA (KLOR-CON M) 20 MEQ tablet Take 1 tablet (20 mEq total) by mouth daily.  . pravastatin (PRAVACHOL) 20 MG tablet Take 20 mg by mouth every evening.  . sertraline (ZOLOFT) 100 MG tablet Take 150 mg by mouth in the morning.  . Zinc Oxide 20 % PSTE Apply topically. Apply to peri/buttocks topically as needed for After incontinent episodes as preventative unsupervised self-administration May keep at bedside   No facility-administered encounter medications on file as of 12/25/2022.    Review of Systems:  Review of Systems  Health Maintenance  Topic Date Due  . Zoster Vaccines- Shingrix (1 of 2) 05/23/1973  . DEXA SCAN  Never done  . INFLUENZA VACCINE  08/14/2022  . COVID-19 Vaccine (6 - 2024-25 season) 09/14/2022  . Medicare Annual Wellness (AWV)  02/04/2023  . DTaP/Tdap/Td (3 - Td or Tdap) 01/26/2030  . Pneumonia Vaccine 5+ Years old  Completed  . HPV VACCINES  Aged Out    Physical Exam: There were no vitals filed for this visit. There is no height or weight on file to calculate BMI. Physical Exam  Labs reviewed: Basic Metabolic Panel: Recent Labs    12/13/22 0432 12/14/22 0736 12/15/22 0401  NA 132* 135 135  K 3.1* 3.3* 3.6  CL 97* 98 101  CO2 24 27 24   GLUCOSE 103* 101* 99  BUN 22 16 16   CREATININE 0.79  0.76 0.70 0.71  CALCIUM 8.4* 8.4* 8.6*   Liver Function Tests: Recent Labs    12/10/22 0424  AST 28  ALT 32  ALKPHOS 114  BILITOT 1.2*  PROT 7.1  ALBUMIN 2.6*   No results for  input(s): "LIPASE", "AMYLASE" in the last 8760 hours. No results for input(s): "AMMONIA" in the last 8760 hours. CBC: Recent Labs    12/13/22 0432 12/14/22 0736 12/15/22 0401  WBC 22.8* 22.0* 18.2*  HGB 9.7* 9.9* 9.4*  HCT 30.0* 30.4* 29.0*  MCV 99.7 97.7 100.0  PLT 441* 463* 462*   Lipid Panel: No results for input(s): "CHOL", "HDL", "LDLCALC", "TRIG", "CHOLHDL", "LDLDIRECT" in the last 8760 hours. Lab Results  Component Value Date   HGBA1C (H) 10/02/2009    6.0 (NOTE)  According to the ADA Clinical Practice Recommendations for 2011, when HbA1c is used as a screening test:   >=6.5%   Diagnostic of Diabetes Mellitus           (if abnormal result  is confirmed)  5.7-6.4%   Increased risk of developing Diabetes Mellitus  References:Diagnosis and Classification of Diabetes Mellitus,Diabetes Care,2011,34(Suppl 1):S62-S69 and Standards of Medical Care in         Diabetes - 2011,Diabetes Care,2011,34  (Suppl 1):S11-S61.    Procedures since last visit: ECHOCARDIOGRAM COMPLETE Result Date: 12/11/2022    ECHOCARDIOGRAM REPORT   Patient Name:   [name] Date of Exam: 12/11/2022 Medical Rec #:  [id number]   Height:       64.0 in Accession #:    [id number]  Weight:       145.9 lb Date of Birth:  05-30-1921   BSA:          1.711 m Patient Age:    89 years    BP:           137/55 mmHg Patient Gender: F           HR:           109 bpm. Exam Location:  Inpatient Procedure: 2D Echo, Cardiac Doppler and Color Doppler Indications:    I50.40* Unspecified combined systolic (congestive) and diastolic                 (congestive) heart failure  History:        Patient has prior history of Echocardiogram examinations, most                 recent 10/05/2015. CHF, Abnormal ECG, Arrythmias:Bradycardia,                 Signs/Symptoms:Shortness of Breath and Dyspnea; Risk                 Factors:Dyslipidemia and Hypertension.  Sonographer:    Sheralyn Boatman RDCS Referring Phys: [id number] SUBRINA SUNDIL IMPRESSIONS  1. Left ventricular ejection fraction, by estimation, is 60 to 65%. The left ventricle has normal function. The left ventricle has no regional wall motion abnormalities. There is mild left ventricular hypertrophy. Indeterminate diastolic filling due to E-A fusion. Elevated left ventricular end-diastolic pressure.  2. Right ventricular systolic function is normal. The right ventricular size is normal. There is normal pulmonary artery systolic pressure.  3. The mitral valve is abnormal. Trivial mitral valve regurgitation. Moderate mitral stenosis. The mean mitral valve gradient is 8.3 mmHg in the setting of tachycardia. Severe mitral annular calcification.  4. The aortic valve is calcified. There is severe calcifcation of the aortic valve. There is severe thickening of the aortic valve. Aortic valve regurgitation is not visualized. Severe aortic valve stenosis. Aortic valve area, by VTI measures 0.74 cm. Aortic valve mean gradient measures 38.0 mmHg. Aortic valve Vmax measures 4.10 m/s. DVI is 0.27.  5. The inferior vena cava is normal in size with greater than 50% respiratory variability, suggesting right atrial pressure of 3 mmHg. Comparison(s): No prior Echocardiogram. Severe aortic stenosis and moderate mitral stenosis. FINDINGS  Left Ventricle: Left ventricular ejection fraction, by estimation, is 60 to 65%. The left ventricle has normal function. The left ventricle has no regional wall motion abnormalities. The left ventricular internal cavity size was normal in size. There is  mild left ventricular hypertrophy. Indeterminate diastolic filling due to E-A fusion. Elevated left ventricular end-diastolic pressure. Right Ventricle: The right ventricular  size is normal. No increase in right ventricular wall thickness. Right ventricular systolic function is normal. There is normal pulmonary artery systolic pressure. The tricuspid regurgitant velocity is  2.33 m/s, and  with an assumed right atrial pressure of 3 mmHg, the estimated right ventricular systolic pressure is 24.7 mmHg. Left Atrium: Left atrial size was normal in size. Right Atrium: Right atrial size was normal in size. Pericardium: There is no evidence of pericardial effusion. Mitral Valve: The mitral valve is abnormal. Severe mitral annular calcification. Trivial mitral valve regurgitation. Moderate mitral valve stenosis. MV peak gradient, 15.6 mmHg. The mean mitral valve gradient is 8.3 mmHg. Tricuspid Valve: The tricuspid valve is normal in structure. Tricuspid valve regurgitation is mild . No evidence of tricuspid stenosis. Aortic Valve: The aortic valve is calcified. There is severe calcifcation of the aortic valve. There is severe thickening of the aortic valve. Aortic valve regurgitation is not visualized. Severe aortic stenosis is present. Aortic valve mean gradient measures 38.0 mmHg. Aortic valve peak gradient measures 67.1 mmHg. Aortic valve area, by VTI measures 0.74 cm. Pulmonic Valve: The pulmonic valve was normal in structure. Pulmonic valve regurgitation is mild. No evidence of pulmonic stenosis. Aorta: The aortic root and ascending aorta are structurally normal, with no evidence of dilitation. Venous: The inferior vena cava is normal in size with greater than 50% respiratory variability, suggesting right atrial pressure of 3 mmHg. IAS/Shunts: No atrial level shunt detected by color flow Doppler.  LEFT VENTRICLE PLAX 2D LVIDd:         3.35 cm     Diastology LVIDs:         2.50 cm     LV e' medial:    11.40 cm/s LV PW:         1.55 cm     LV E/e' medial:  15.1 LV IVS:        1.10 cm     LV e' lateral:   4.13 cm/s LVOT diam:     2.15 cm     LV E/e' lateral: 41.6 LV SV:         58 LV SV Index:   34 LVOT Area:     3.63 cm  LV Volumes (MOD) LV vol d, MOD A2C: 69.7 ml LV vol d, MOD A4C: 51.6 ml LV vol s, MOD A2C: 26.7 ml LV vol s, MOD A4C: 19.4 ml LV SV MOD A2C:     43.0 ml LV SV MOD A4C:      51.6 ml LV SV MOD BP:      36.6 ml RIGHT VENTRICLE            IVC RV S prime:     9.68 cm/s  IVC diam: 1.80 cm TAPSE (M-mode): 1.9 cm LEFT ATRIUM             Index        RIGHT ATRIUM           Index LA diam:        3.40 cm 1.99 cm/m   RA Area:     10.80 cm LA Vol (A2C):   41.4 ml 24.19 ml/m  RA Volume:   19.50 ml  11.40 ml/m LA Vol (A4C):   25.1 ml 14.67 ml/m LA Biplane Vol: 35.5 ml 20.75 ml/m  AORTIC VALVE                     PULMONIC VALVE AV Area (Vmax):    0.98  cm      PR End Diast Vel: 1.51 msec AV Area (Vmean):   0.94 cm AV Area (VTI):     0.74 cm AV Vmax:           409.50 cm/s AV Vmean:          281.000 cm/s AV VTI:            0.783 m AV Peak Grad:      67.1 mmHg AV Mean Grad:      38.0 mmHg LVOT Vmax:         110.00 cm/s LVOT Vmean:        72.700 cm/s LVOT VTI:          0.160 m LVOT/AV VTI ratio: 0.20  AORTA Ao Root diam: 3.20 cm Ao Asc diam:  3.00 cm MITRAL VALVE                TRICUSPID VALVE MV Area (PHT): 4.39 cm     TR Peak grad:   21.7 mmHg MV Area VTI:   1.95 cm     TR Vmax:        233.00 cm/s MV Peak grad:  15.6 mmHg MV Mean grad:  8.3 mmHg     SHUNTS MV Vmax:       1.97 m/s     Systemic VTI:  0.16 m MV Vmean:      132.3 cm/s   Systemic Diam: 2.15 cm MV Decel Time: 173 msec MV E velocity: 172.00 cm/s Vishnu Priya Mallipeddi Electronically signed by Winfield Rast Mallipeddi Signature Date/Time: 12/11/2022/12:43:31 PM    Final    DG Chest Port 1 View Result Date: 12/11/2022 CLINICAL DATA:  [id number], [id number]. Chest pain and shortness of breath, fever. EXAM: PORTABLE CHEST 1 VIEW COMPARISON:  Portable chest yesterday at 3:37 a.m. FINDINGS: 2:38 a.m. There is mild cardiomegaly. Central vascular prominence and mild interstitial edema continue to be seen with small pleural effusions. There are increased bilateral perihilar opacities which could be alveolar edema or pneumonia, with persistent patchy airspace disease in the right upper lobe which probably is due to pneumonia. Findings are  superimposed on chronic interstitial lung disease. The mediastinum is stable. There is aortic atherosclerosis.  No new osseous findings. Thoracic spondylosis.  Chronic right humeral ORIF. IMPRESSION: 1. Increased bilateral perihilar opacities which could be alveolar edema or pneumonia, with persistent patchy airspace disease in the right upper lobe which probably is due to pneumonia. 2. Findings are superimposed on chronic interstitial lung disease. 3. Cardiomegaly with central vascular prominence and mild interstitial edema. 4. Small pleural effusions. 5. Aortic atherosclerosis. Electronically Signed   By: Almira Bar M.D.   On: 12/11/2022 04:30   DG CHEST PORT 1 VIEW Result Date: 12/10/2022 CLINICAL DATA:  Respiratory compromise. Increased shortness of breath and hypoxia EXAM: PORTABLE CHEST 1 VIEW COMPARISON:  12/09/2022 FINDINGS: Chronic lung disease with diffuse interstitial reticulation. Progression of patchy bilateral airspace disease especially at the right apex. No effusion or pneumothorax. Stable heart size and mediastinal contours. IMPRESSION: Worsening pneumonia superimposed on interstitial lung disease. Electronically Signed   By: Tiburcio Pea M.D.   On: 12/10/2022 04:29   DG Chest Port 1 View Result Date: 12/09/2022 CLINICAL DATA:  Three day history of productive cough, shortness of breath, decreased appetite EXAM: PORTABLE CHEST 1 VIEW COMPARISON:  Chest radiograph dated 01/31/2021, CT chest dated 07/22/2021 FINDINGS: Normal lung volumes. Increased bilateral interstitial and patchy opacities. Blunting of the bilateral costophrenic angles. No pneumothorax.  The heart size and mediastinal contours are within normal limits. Partially imaged right proximal humeral fixation hardware. IMPRESSION: 1. Increased bilateral interstitial and patchy opacities, which may represent pulmonary edema or multifocal pneumonia. 2. Blunting of the bilateral costophrenic angles, which may represent small  pleural effusions. Electronically Signed   By: Agustin Cree M.D.   On: 12/09/2022 16:51    Assessment/Plan 1. Multifocal pneumonia (Primary) ***  2. Tachycardia ***  3. Anxiety ***  4. Acute on chronic diastolic congestive heart failure (HCC) ***  5. AKI (acute kidney injury) (HCC) ***  6. Interstitial lung disease (HCC) ***  7. Hypothyroidism, unspecified type ***    Labs/tests ordered:  * No order type specified * Next appt:  Visit date not found

## 2022-12-26 DIAGNOSIS — R1312 Dysphagia, oropharyngeal phase: Secondary | ICD-10-CM | POA: Diagnosis not present

## 2022-12-26 DIAGNOSIS — J9601 Acute respiratory failure with hypoxia: Secondary | ICD-10-CM | POA: Diagnosis not present

## 2022-12-26 DIAGNOSIS — R41841 Cognitive communication deficit: Secondary | ICD-10-CM | POA: Diagnosis not present

## 2022-12-26 DIAGNOSIS — G9341 Metabolic encephalopathy: Secondary | ICD-10-CM | POA: Diagnosis not present

## 2022-12-26 DIAGNOSIS — M6281 Muscle weakness (generalized): Secondary | ICD-10-CM | POA: Diagnosis not present

## 2022-12-26 DIAGNOSIS — R2681 Unsteadiness on feet: Secondary | ICD-10-CM | POA: Diagnosis not present

## 2022-12-28 ENCOUNTER — Encounter: Payer: Self-pay | Admitting: Internal Medicine

## 2022-12-29 DIAGNOSIS — R41841 Cognitive communication deficit: Secondary | ICD-10-CM | POA: Diagnosis not present

## 2022-12-29 DIAGNOSIS — R1312 Dysphagia, oropharyngeal phase: Secondary | ICD-10-CM | POA: Diagnosis not present

## 2022-12-29 DIAGNOSIS — J9601 Acute respiratory failure with hypoxia: Secondary | ICD-10-CM | POA: Diagnosis not present

## 2022-12-29 DIAGNOSIS — G9341 Metabolic encephalopathy: Secondary | ICD-10-CM | POA: Diagnosis not present

## 2022-12-29 DIAGNOSIS — R2681 Unsteadiness on feet: Secondary | ICD-10-CM | POA: Diagnosis not present

## 2022-12-29 DIAGNOSIS — M6281 Muscle weakness (generalized): Secondary | ICD-10-CM | POA: Diagnosis not present

## 2022-12-30 DIAGNOSIS — R41841 Cognitive communication deficit: Secondary | ICD-10-CM | POA: Diagnosis not present

## 2022-12-30 DIAGNOSIS — R1312 Dysphagia, oropharyngeal phase: Secondary | ICD-10-CM | POA: Diagnosis not present

## 2022-12-30 DIAGNOSIS — G9341 Metabolic encephalopathy: Secondary | ICD-10-CM | POA: Diagnosis not present

## 2022-12-30 DIAGNOSIS — J9601 Acute respiratory failure with hypoxia: Secondary | ICD-10-CM | POA: Diagnosis not present

## 2022-12-30 DIAGNOSIS — M6281 Muscle weakness (generalized): Secondary | ICD-10-CM | POA: Diagnosis not present

## 2022-12-30 DIAGNOSIS — R2681 Unsteadiness on feet: Secondary | ICD-10-CM | POA: Diagnosis not present

## 2022-12-31 DIAGNOSIS — R1312 Dysphagia, oropharyngeal phase: Secondary | ICD-10-CM | POA: Diagnosis not present

## 2022-12-31 DIAGNOSIS — G9341 Metabolic encephalopathy: Secondary | ICD-10-CM | POA: Diagnosis not present

## 2022-12-31 DIAGNOSIS — J9601 Acute respiratory failure with hypoxia: Secondary | ICD-10-CM | POA: Diagnosis not present

## 2022-12-31 DIAGNOSIS — R41841 Cognitive communication deficit: Secondary | ICD-10-CM | POA: Diagnosis not present

## 2022-12-31 DIAGNOSIS — M6281 Muscle weakness (generalized): Secondary | ICD-10-CM | POA: Diagnosis not present

## 2022-12-31 DIAGNOSIS — R2681 Unsteadiness on feet: Secondary | ICD-10-CM | POA: Diagnosis not present

## 2023-01-01 DIAGNOSIS — R2681 Unsteadiness on feet: Secondary | ICD-10-CM | POA: Diagnosis not present

## 2023-01-01 DIAGNOSIS — G9341 Metabolic encephalopathy: Secondary | ICD-10-CM | POA: Diagnosis not present

## 2023-01-01 DIAGNOSIS — J9601 Acute respiratory failure with hypoxia: Secondary | ICD-10-CM | POA: Diagnosis not present

## 2023-01-01 DIAGNOSIS — M6281 Muscle weakness (generalized): Secondary | ICD-10-CM | POA: Diagnosis not present

## 2023-01-01 DIAGNOSIS — R41841 Cognitive communication deficit: Secondary | ICD-10-CM | POA: Diagnosis not present

## 2023-01-01 DIAGNOSIS — R1312 Dysphagia, oropharyngeal phase: Secondary | ICD-10-CM | POA: Diagnosis not present

## 2023-01-02 DIAGNOSIS — J9601 Acute respiratory failure with hypoxia: Secondary | ICD-10-CM | POA: Diagnosis not present

## 2023-01-02 DIAGNOSIS — R1312 Dysphagia, oropharyngeal phase: Secondary | ICD-10-CM | POA: Diagnosis not present

## 2023-01-02 DIAGNOSIS — M6281 Muscle weakness (generalized): Secondary | ICD-10-CM | POA: Diagnosis not present

## 2023-01-02 DIAGNOSIS — G9341 Metabolic encephalopathy: Secondary | ICD-10-CM | POA: Diagnosis not present

## 2023-01-02 DIAGNOSIS — R2681 Unsteadiness on feet: Secondary | ICD-10-CM | POA: Diagnosis not present

## 2023-01-02 DIAGNOSIS — R41841 Cognitive communication deficit: Secondary | ICD-10-CM | POA: Diagnosis not present

## 2023-01-05 DIAGNOSIS — J9601 Acute respiratory failure with hypoxia: Secondary | ICD-10-CM | POA: Diagnosis not present

## 2023-01-05 DIAGNOSIS — R2681 Unsteadiness on feet: Secondary | ICD-10-CM | POA: Diagnosis not present

## 2023-01-05 DIAGNOSIS — G9341 Metabolic encephalopathy: Secondary | ICD-10-CM | POA: Diagnosis not present

## 2023-01-05 DIAGNOSIS — D649 Anemia, unspecified: Secondary | ICD-10-CM | POA: Diagnosis not present

## 2023-01-05 DIAGNOSIS — R1312 Dysphagia, oropharyngeal phase: Secondary | ICD-10-CM | POA: Diagnosis not present

## 2023-01-05 DIAGNOSIS — E039 Hypothyroidism, unspecified: Secondary | ICD-10-CM | POA: Diagnosis not present

## 2023-01-05 DIAGNOSIS — I10 Essential (primary) hypertension: Secondary | ICD-10-CM | POA: Diagnosis not present

## 2023-01-05 DIAGNOSIS — M6281 Muscle weakness (generalized): Secondary | ICD-10-CM | POA: Diagnosis not present

## 2023-01-05 DIAGNOSIS — R41841 Cognitive communication deficit: Secondary | ICD-10-CM | POA: Diagnosis not present

## 2023-01-06 DIAGNOSIS — G9341 Metabolic encephalopathy: Secondary | ICD-10-CM | POA: Diagnosis not present

## 2023-01-06 DIAGNOSIS — R2681 Unsteadiness on feet: Secondary | ICD-10-CM | POA: Diagnosis not present

## 2023-01-06 DIAGNOSIS — R41841 Cognitive communication deficit: Secondary | ICD-10-CM | POA: Diagnosis not present

## 2023-01-06 DIAGNOSIS — J9601 Acute respiratory failure with hypoxia: Secondary | ICD-10-CM | POA: Diagnosis not present

## 2023-01-06 DIAGNOSIS — M6281 Muscle weakness (generalized): Secondary | ICD-10-CM | POA: Diagnosis not present

## 2023-01-06 DIAGNOSIS — R1312 Dysphagia, oropharyngeal phase: Secondary | ICD-10-CM | POA: Diagnosis not present

## 2023-01-08 DIAGNOSIS — R41841 Cognitive communication deficit: Secondary | ICD-10-CM | POA: Diagnosis not present

## 2023-01-08 DIAGNOSIS — R1312 Dysphagia, oropharyngeal phase: Secondary | ICD-10-CM | POA: Diagnosis not present

## 2023-01-08 DIAGNOSIS — G9341 Metabolic encephalopathy: Secondary | ICD-10-CM | POA: Diagnosis not present

## 2023-01-08 DIAGNOSIS — M6281 Muscle weakness (generalized): Secondary | ICD-10-CM | POA: Diagnosis not present

## 2023-01-08 DIAGNOSIS — R2681 Unsteadiness on feet: Secondary | ICD-10-CM | POA: Diagnosis not present

## 2023-01-08 DIAGNOSIS — J9601 Acute respiratory failure with hypoxia: Secondary | ICD-10-CM | POA: Diagnosis not present

## 2023-01-09 DIAGNOSIS — R41841 Cognitive communication deficit: Secondary | ICD-10-CM | POA: Diagnosis not present

## 2023-01-09 DIAGNOSIS — J9601 Acute respiratory failure with hypoxia: Secondary | ICD-10-CM | POA: Diagnosis not present

## 2023-01-09 DIAGNOSIS — M6281 Muscle weakness (generalized): Secondary | ICD-10-CM | POA: Diagnosis not present

## 2023-01-09 DIAGNOSIS — R2681 Unsteadiness on feet: Secondary | ICD-10-CM | POA: Diagnosis not present

## 2023-01-09 DIAGNOSIS — R1312 Dysphagia, oropharyngeal phase: Secondary | ICD-10-CM | POA: Diagnosis not present

## 2023-01-09 DIAGNOSIS — G9341 Metabolic encephalopathy: Secondary | ICD-10-CM | POA: Diagnosis not present

## 2023-01-10 DIAGNOSIS — R2681 Unsteadiness on feet: Secondary | ICD-10-CM | POA: Diagnosis not present

## 2023-01-10 DIAGNOSIS — R41841 Cognitive communication deficit: Secondary | ICD-10-CM | POA: Diagnosis not present

## 2023-01-10 DIAGNOSIS — G9341 Metabolic encephalopathy: Secondary | ICD-10-CM | POA: Diagnosis not present

## 2023-01-10 DIAGNOSIS — J9601 Acute respiratory failure with hypoxia: Secondary | ICD-10-CM | POA: Diagnosis not present

## 2023-01-10 DIAGNOSIS — R1312 Dysphagia, oropharyngeal phase: Secondary | ICD-10-CM | POA: Diagnosis not present

## 2023-01-10 DIAGNOSIS — M6281 Muscle weakness (generalized): Secondary | ICD-10-CM | POA: Diagnosis not present

## 2023-01-12 ENCOUNTER — Encounter: Payer: Self-pay | Admitting: Sports Medicine

## 2023-01-12 ENCOUNTER — Non-Acute Institutional Stay: Payer: Self-pay | Admitting: Sports Medicine

## 2023-01-12 DIAGNOSIS — R261 Paralytic gait: Secondary | ICD-10-CM | POA: Diagnosis not present

## 2023-01-12 DIAGNOSIS — E7849 Other hyperlipidemia: Secondary | ICD-10-CM | POA: Diagnosis not present

## 2023-01-12 DIAGNOSIS — N200 Calculus of kidney: Secondary | ICD-10-CM | POA: Diagnosis not present

## 2023-01-12 DIAGNOSIS — R109 Unspecified abdominal pain: Secondary | ICD-10-CM | POA: Diagnosis not present

## 2023-01-12 DIAGNOSIS — K219 Gastro-esophageal reflux disease without esophagitis: Secondary | ICD-10-CM | POA: Diagnosis not present

## 2023-01-12 DIAGNOSIS — R234 Changes in skin texture: Secondary | ICD-10-CM | POA: Diagnosis not present

## 2023-01-12 DIAGNOSIS — J849 Interstitial pulmonary disease, unspecified: Secondary | ICD-10-CM | POA: Diagnosis not present

## 2023-01-12 DIAGNOSIS — J449 Chronic obstructive pulmonary disease, unspecified: Secondary | ICD-10-CM | POA: Diagnosis not present

## 2023-01-12 DIAGNOSIS — M6281 Muscle weakness (generalized): Secondary | ICD-10-CM | POA: Diagnosis not present

## 2023-01-12 DIAGNOSIS — L678 Other hair color and hair shaft abnormalities: Secondary | ICD-10-CM | POA: Diagnosis not present

## 2023-01-12 DIAGNOSIS — K5900 Constipation, unspecified: Secondary | ICD-10-CM | POA: Diagnosis not present

## 2023-01-12 DIAGNOSIS — D72829 Elevated white blood cell count, unspecified: Secondary | ICD-10-CM | POA: Diagnosis not present

## 2023-01-12 DIAGNOSIS — D649 Anemia, unspecified: Secondary | ICD-10-CM | POA: Diagnosis not present

## 2023-01-12 DIAGNOSIS — R000 Tachycardia, unspecified: Secondary | ICD-10-CM | POA: Diagnosis not present

## 2023-01-12 DIAGNOSIS — R0602 Shortness of breath: Secondary | ICD-10-CM | POA: Diagnosis not present

## 2023-01-12 DIAGNOSIS — I350 Nonrheumatic aortic (valve) stenosis: Secondary | ICD-10-CM | POA: Diagnosis not present

## 2023-01-12 DIAGNOSIS — R1312 Dysphagia, oropharyngeal phase: Secondary | ICD-10-CM | POA: Diagnosis not present

## 2023-01-12 DIAGNOSIS — E039 Hypothyroidism, unspecified: Secondary | ICD-10-CM | POA: Diagnosis not present

## 2023-01-12 DIAGNOSIS — I5032 Chronic diastolic (congestive) heart failure: Secondary | ICD-10-CM | POA: Diagnosis not present

## 2023-01-12 DIAGNOSIS — R0609 Other forms of dyspnea: Secondary | ICD-10-CM | POA: Diagnosis not present

## 2023-01-12 DIAGNOSIS — F419 Anxiety disorder, unspecified: Secondary | ICD-10-CM | POA: Diagnosis not present

## 2023-01-12 DIAGNOSIS — F411 Generalized anxiety disorder: Secondary | ICD-10-CM | POA: Diagnosis not present

## 2023-01-12 NOTE — Addendum Note (Signed)
Addended by: Cephus Richer on: 01/12/2023 08:10 PM   Modules accepted: Level of Service

## 2023-01-12 NOTE — Progress Notes (Signed)
This encounter was created in error - please disregard.

## 2023-01-13 ENCOUNTER — Non-Acute Institutional Stay (SKILLED_NURSING_FACILITY): Payer: Self-pay | Admitting: Nurse Practitioner

## 2023-01-13 ENCOUNTER — Encounter: Payer: Self-pay | Admitting: Nurse Practitioner

## 2023-01-13 DIAGNOSIS — F419 Anxiety disorder, unspecified: Secondary | ICD-10-CM | POA: Insufficient documentation

## 2023-01-13 DIAGNOSIS — J849 Interstitial pulmonary disease, unspecified: Secondary | ICD-10-CM | POA: Diagnosis not present

## 2023-01-13 DIAGNOSIS — D649 Anemia, unspecified: Secondary | ICD-10-CM | POA: Diagnosis not present

## 2023-01-13 DIAGNOSIS — D72829 Elevated white blood cell count, unspecified: Secondary | ICD-10-CM | POA: Diagnosis not present

## 2023-01-13 DIAGNOSIS — E039 Hypothyroidism, unspecified: Secondary | ICD-10-CM

## 2023-01-13 DIAGNOSIS — I350 Nonrheumatic aortic (valve) stenosis: Secondary | ICD-10-CM | POA: Diagnosis not present

## 2023-01-13 DIAGNOSIS — I5032 Chronic diastolic (congestive) heart failure: Secondary | ICD-10-CM | POA: Diagnosis not present

## 2023-01-13 DIAGNOSIS — R000 Tachycardia, unspecified: Secondary | ICD-10-CM | POA: Insufficient documentation

## 2023-01-13 DIAGNOSIS — K219 Gastro-esophageal reflux disease without esophagitis: Secondary | ICD-10-CM | POA: Diagnosis not present

## 2023-01-13 NOTE — Assessment & Plan Note (Signed)
 chronic, Hgb 9s, update B12, Iron, Ferritin

## 2023-01-13 NOTE — Progress Notes (Signed)
 Location:  [facility] Room Number: 57-A Place of Service:  SNF [phone]) Provider:  Vadhir Mcnay Lorenda Adline CARROLYN Anastasio Rockey, DO  Patient Care Team: Anastasio Rockey, DO as PCP - General (Family Medicine) [name], Aleene PARAS, MD as PCP - Cardiology (Cardiology)  Extended Emergency Contact Information Primary Emergency Contact: Monette,[name] Mobile Phone: [phone] Relation: Son Secondary Emergency Contact: Delores Alm RADDLE Address: [address]          [place], [place] Mobile Phone: [phone] Relation: Spouse  Code Status:  DNR Goals of care: Advanced Directive information    01/12/2023    3:46 PM  Advanced Directives  Does Patient Have a Medical Advance Directive? Yes  Type of Estate Agent of [place];Living will;Out of facility DNR (pink MOST or yellow form)  Does patient want to make changes to medical advance directive? No - Patient declined  Copy of Healthcare Power of Attorney in Chart? Yes - validated most recent copy scanned in chart (See row information)     Chief Complaint  Patient presents with   Acute Visit    Medication review    HPI:  Pt is a 89 y.o. female seen today for an acute visit for medication review.     PNA, still cough, using Nebs, O2, wbc 18.2 12/15/22  Tachycardia, HR 100s intermittently, on Metoprolol , EKG showed SR  Anxiety, prn Alprazolam , Sertraline .   CHF, edema BLE, on Furosemide , Bun/creat 16/0.71 12/15/22  Interstitial lung disease, followed by Pulmonology, on O2, Albuterol  HFA, Brovana , Pulmicort , Atrovent  Neb, Xopenex  Neb  Severe AS conservative management per cardiology  Anemia, chronic, Hgb 9s  Hypothyroidism, on Levothyroxine   GERD, on Pantoprazole .   Past Medical History:  Diagnosis Date   Anxiety    Aortic stenosis    a. mild by echo 2017.   Arthritis    Bradycardia    a. ? details not clear - in the past has had PVCs which have lowered effective HR  count by pulse check.   Chronic diastolic CHF (congestive heart failure) (HCC)    Diastolic dysfunction    mild    Frequent PVCs    a. 48 Hour Holter 10/08/15 showed NSR and sinus tach with frequent PVCs, occasional bigeminy, total PVC burden 16.5%, lowest HR 59.   Hyperlipidemia    Hypertension    Hypertensive heart disease    Hypothyroidism    Kidney stones    Mild mitral regurgitation by prior echocardiogram    Tachycardia    history of tachycardia and bradycardia   Past Surgical History:  Procedure Laterality Date   APPENDECTOMY     CHOLECYSTECTOMY     EXTRACORPOREAL SHOCK WAVE LITHOTRIPSY     EYE SURGERY Bilateral    cataract surgery with lens implant   ORIF HUMERUS FRACTURE Right 10/31/2013   Procedure: Open Reduction Internal Fixation Right Proximal Humerus Fracture;  Surgeon: Oneil JAYSON Herald, MD;  Location: [facility] OR;  Service: Orthopedics;  Laterality: Right;   TONSILLECTOMY     TOTAL ABDOMINAL HYSTERECTOMY      Allergies  Allergen Reactions   Diltiazem Hcl Er Beads Other (See Comments)    Dizziness (not present on the [facility] in 2024)    Outpatient Encounter Medications as of 01/13/2023  Medication Sig   acetaminophen  (TYLENOL ) 500 MG tablet Take 500 mg by mouth every 6 (six) hours as needed (for pain).   albuterol  (VENTOLIN  HFA) 108 (90 Base) MCG/ACT inhaler Inhale 1 puff into the  lungs every 4 (four) hours as needed for wheezing or shortness of breath.   arformoterol  (BROVANA ) 15 MCG/2ML NEBU Take 2 mLs (15 mcg total) by nebulization 2 (two) times daily.   budesonide  (PULMICORT ) 0.25 MG/2ML nebulizer solution Take 2 mLs (0.25 mg total) by nebulization 2 (two) times daily.   cholecalciferol  (VITAMIN D) 1000 units tablet Take 1,000 Units by mouth daily.   Emollient (AQUAPHOR HEALING BALM EX) Apply 1 application  topically See admin instructions. Apply to the lips 3 times a day   furosemide  (LASIX ) 20 MG tablet Take 1 tablet (20 mg total) by mouth daily.   ipratropium  (ATROVENT ) 0.02 % nebulizer solution Take 2.5 mLs (0.5 mg total) by nebulization 2 (two) times daily.   ketoconazole (NIZORAL) 2 % shampoo Apply 1 Application topically See admin instructions. Shampoo in the morning on Wednesdays and Saturdays   levalbuterol  (XOPENEX ) 0.31 MG/3ML nebulizer solution Take 1 ampule by nebulization every 8 (eight) hours as needed for wheezing.   levalbuterol  (XOPENEX ) 0.63 MG/3ML nebulizer solution Take 3 mLs (0.63 mg total) by nebulization 2 (two) times daily.   levothyroxine  (SYNTHROID ) 75 MCG tablet Take 75 mcg by mouth daily before breakfast.   metoprolol  tartrate (LOPRESSOR ) 25 MG tablet Take 0.5 tablets (12.5 mg total) by mouth 2 (two) times daily.   Multiple Vitamins-Minerals (PRESERVISION AREDS 2+MULTI VIT) CAPS Take 1 capsule by mouth 2 (two) times daily.   nitroGLYCERIN  (NITROSTAT ) 0.4 MG SL tablet Place 0.4 mg under the tongue every 5 (five) minutes x 3 doses as needed for chest pain (call MD, if not effective).   pantoprazole  (PROTONIX ) 40 MG tablet Take 1 tablet (40 mg total) by mouth 2 (two) times daily.   potassium chloride  SA (KLOR-CON  M) 20 MEQ tablet Take 1 tablet (20 mEq total) by mouth daily.   pravastatin  (PRAVACHOL ) 20 MG tablet Take 20 mg by mouth every evening.   sertraline  (ZOLOFT ) 100 MG tablet Take 150 mg by mouth in the morning.   ALPRAZolam  (XANAX ) 0.25 MG tablet Take 0.5 tablets (0.125 mg total) by mouth 2 (two) times daily as needed for anxiety. (Patient not taking: Reported on 01/13/2023)   Zinc Oxide 20 % PSTE Apply topically. Apply to peri/buttocks topically as needed for After incontinent episodes as preventative unsupervised self-administration May keep at bedside (Patient not taking: Reported on 01/12/2023)   No facility-administered encounter medications on file as of 01/13/2023.    Review of Systems  Constitutional:  Positive for fatigue. Negative for appetite change and fever.  HENT:  Negative for congestion, trouble swallowing  and voice change.   Eyes:  Negative for visual disturbance.  Respiratory:  Positive for cough and shortness of breath. Negative for chest tightness and wheezing.   Cardiovascular:  Positive for leg swelling. Negative for chest pain and palpitations.  Gastrointestinal:  Negative for abdominal pain, constipation, nausea and vomiting.  Genitourinary:  Negative for dysuria, frequency and urgency.       Nocturnal urination 1x usually.   Musculoskeletal:  Positive for arthralgias and gait problem.  Skin:  Negative for color change.  Neurological:  Negative for tremors, speech difficulty and headaches.  Psychiatric/Behavioral:  Positive for sleep disturbance. Negative for confusion. The patient is not nervous/anxious.        Woke up 3-4 am sometimes.     Immunization History  Administered Date(s) Administered   Influenza Split 09/15/2008, 10/11/2009, 09/09/2013, 11/17/2017, 10/29/2020   Influenza, High Dose Seasonal PF 10/15/2015, 10/13/2016, 11/17/2017, 10/28/2018   Moderna Sars-Covid-2 Vaccination  01/17/2019, 02/14/2019, 11/16/2019, 06/12/2020   Pfizer Covid-19 Vaccine Bivalent Booster 40yrs & up 10/02/2020   Pneumococcal Conjugate-13 08/01/2013   Pneumococcal Polysaccharide-23 03/05/1994   Td 06/26/2005   Tdap 01/27/2020   Zoster, Live 11/27/2011, 01/27/2020, 07/30/2020   Pertinent  Health Maintenance Due  Topic Date Due   DEXA SCAN  Never done   INFLUENZA VACCINE  08/14/2022      02/20/2018    3:53 PM 11/15/2020    3:16 PM 01/31/2021    5:46 PM  Fall Risk  (RETIRED) Patient Fall Risk Level Low fall risk High fall risk High fall risk   Functional Status Survey:    Vitals:   01/13/23 1140  BP: (!) 127/56  Pulse: (!) 103  Resp: 16  Temp: 97.7 F (36.5 C)  SpO2: 98%  Weight: 132 lb (59.9 kg)  Height: 5' 4 (1.626 m)   Body mass index is 22.66 kg/m. Physical Exam Vitals and nursing note reviewed.  Constitutional:      Appearance: Normal appearance.  HENT:     Head:  Normocephalic and atraumatic.     Nose: Nose normal.     Mouth/Throat:     Mouth: Mucous membranes are moist.  Cardiovascular:     Rate and Rhythm: Regular rhythm. Tachycardia present.     Heart sounds: Murmur heard.  Pulmonary:     Breath sounds: Rales present. No wheezing or rhonchi.     Comments: Bibasilar rales, DOE, O2 desaturation with exertion, able to complete full sentence.  Abdominal:     General: Bowel sounds are normal.     Palpations: Abdomen is soft.     Tenderness: There is no abdominal tenderness.  Musculoskeletal:        General: Normal range of motion.     Cervical back: Normal range of motion and neck supple.     Right lower leg: Edema present.     Left lower leg: Edema present.     Comments: 1+ edema BLE  Skin:    General: Skin is warm and dry.  Neurological:     General: No focal deficit present.     Mental Status: She is alert and oriented to person, place, and time. Mental status is at baseline.     Gait: Gait abnormal.  Psychiatric:        Mood and Affect: Mood normal.        Behavior: Behavior normal.        Thought Content: Thought content normal.        Judgment: Judgment normal.     Labs reviewed: Recent Labs    12/13/22 0432 12/14/22 0736 12/15/22 0401  NA 132* 135 135  K 3.1* 3.3* 3.6  CL 97* 98 101  CO2 24 27 24   GLUCOSE 103* 101* 99  BUN 22 16 16   CREATININE 0.79  0.76 0.70 0.71  CALCIUM 8.4* 8.4* 8.6*   Recent Labs    12/10/22 0424  AST 28  ALT 32  ALKPHOS 114  BILITOT 1.2*  PROT 7.1  ALBUMIN 2.6*   Recent Labs    12/13/22 0432 12/14/22 0736 12/15/22 0401  WBC 22.8* 22.0* 18.2*  HGB 9.7* 9.9* 9.4*  HCT 30.0* 30.4* 29.0*  MCV 99.7 97.7 100.0  PLT 441* 463* 462*   Lab Results  Component Value Date   TSH 0.877 02/24/2017   Lab Results  Component Value Date   HGBA1C (H) 10/02/2009    6.0 (NOTE)  According to the ADA Clinical Practice  Recommendations for 2011, when HbA1c is used as a screening test:   >=6.5%   Diagnostic of Diabetes Mellitus           (if abnormal result  is confirmed)  5.7-6.4%   Increased risk of developing Diabetes Mellitus  References:Diagnosis and Classification of Diabetes Mellitus,Diabetes Care,2011,34(Suppl 1):S62-S69 and Standards of Medical Care in         Diabetes - 2011,Diabetes Care,2011,34  (Suppl 1):S11-S61.   Lab Results  Component Value Date   CHOL 184 02/24/2017   HDL 53 02/24/2017   LDLCALC 95 02/24/2017   TRIG 179 (H) 02/24/2017   CHOLHDL 3.5 02/24/2017    Significant Diagnostic Results in last 30 days:  No results found.  Assessment/Plan Hypothyroidism  on Levothyroxine , update TSH  GERD (gastroesophageal reflux disease) Stable,  on Pantoprazole .    Chronic anemia chronic, Hgb 9s, update B12, Iron, Ferritin   Aortic stenosis Severe AS conservative management per cardiology  Interstitial lung disease (HCC) Interstitial lung disease, followed by Pulmonology, on O2, Albuterol  HFA, Brovana , Pulmicort , Atrovent  Neb, Xopenex  Neb  Chronic diastolic CHF (congestive heart failure) (HCC) edema BLE, on Furosemide , Bun/creat 16/0.71 12/15/22, update CMP/eGFR  Anxiety Stable, prn Alprazolam , Sertraline .   Tachycardia  HR 100s intermittently, on Metoprolol , EKG showed SR  Leukocytosis still cough, using Nebs, O2, wbc 18.2 12/15/22, repeat CBC/diff.      Family/ staff Communication: plan of care reviewed with the patient and charge nurse.   Labs/tests ordered:  CBC/diff, CMP/eGFR, TSH, Vit B12, Iron, Ferritin  Time spend 30 minutes.

## 2023-01-13 NOTE — Assessment & Plan Note (Signed)
 Severe AS conservative management per cardiology

## 2023-01-13 NOTE — Assessment & Plan Note (Signed)
 Stable, prn Alprazolam, Sertraline.

## 2023-01-13 NOTE — Assessment & Plan Note (Signed)
 edema BLE, on Furosemide, Bun/creat 16/0.71 12/15/22, update CMP/eGFR

## 2023-01-13 NOTE — Assessment & Plan Note (Signed)
 HR 100s intermittently, on Metoprolol, EKG showed SR

## 2023-01-13 NOTE — Assessment & Plan Note (Signed)
 on Levothyroxine, update TSH

## 2023-01-13 NOTE — Assessment & Plan Note (Signed)
 still cough, using Nebs, O2, wbc 18.2 12/15/22, repeat CBC/diff.

## 2023-01-13 NOTE — Assessment & Plan Note (Signed)
Stable, on Pantoprazole 

## 2023-01-13 NOTE — Assessment & Plan Note (Signed)
 Interstitial lung disease, followed by Pulmonology, on O2, Albuterol HFA, Brovana, Pulmicort, Atrovent Neb, Xopenex Neb

## 2023-01-15 ENCOUNTER — Ambulatory Visit: Payer: Medicare Other | Admitting: Cardiology

## 2023-01-15 ENCOUNTER — Encounter: Payer: Self-pay | Admitting: Sports Medicine

## 2023-01-15 ENCOUNTER — Encounter: Payer: Self-pay | Admitting: Nurse Practitioner

## 2023-01-15 ENCOUNTER — Non-Acute Institutional Stay (SKILLED_NURSING_FACILITY): Payer: Self-pay | Admitting: Sports Medicine

## 2023-01-15 DIAGNOSIS — E039 Hypothyroidism, unspecified: Secondary | ICD-10-CM

## 2023-01-15 DIAGNOSIS — I5032 Chronic diastolic (congestive) heart failure: Secondary | ICD-10-CM | POA: Diagnosis not present

## 2023-01-15 DIAGNOSIS — I350 Nonrheumatic aortic (valve) stenosis: Secondary | ICD-10-CM | POA: Diagnosis not present

## 2023-01-15 DIAGNOSIS — R109 Unspecified abdominal pain: Secondary | ICD-10-CM

## 2023-01-15 DIAGNOSIS — J849 Interstitial pulmonary disease, unspecified: Secondary | ICD-10-CM | POA: Diagnosis not present

## 2023-01-15 DIAGNOSIS — J189 Pneumonia, unspecified organism: Secondary | ICD-10-CM

## 2023-01-15 DIAGNOSIS — N200 Calculus of kidney: Secondary | ICD-10-CM | POA: Insufficient documentation

## 2023-01-15 DIAGNOSIS — F411 Generalized anxiety disorder: Secondary | ICD-10-CM

## 2023-01-15 LAB — CBC AND DIFFERENTIAL
HCT: 29 — AB (ref 36–46)
Hemoglobin: 9.3 — AB (ref 12.0–16.0)
Neutrophils Absolute: 5559
Platelets: 377 10*3/uL (ref 150–400)
WBC: 8.7

## 2023-01-15 LAB — VITAMIN B12: Vitamin B-12: 847

## 2023-01-15 LAB — COMPREHENSIVE METABOLIC PANEL
Albumin: 3.3 — AB (ref 3.5–5.0)
Calcium: 9.1 (ref 8.7–10.7)
Globulin: 3.4
eGFR: 78

## 2023-01-15 LAB — BASIC METABOLIC PANEL
BUN: 17 (ref 4–21)
CO2: 27 — AB (ref 13–22)
Chloride: 103 (ref 99–108)
Creatinine: 0.7 (ref 0.5–1.1)
Glucose: 97
Potassium: 4.6 meq/L (ref 3.5–5.1)
Sodium: 138 (ref 137–147)

## 2023-01-15 LAB — IRON,TIBC AND FERRITIN PANEL: Iron: 32

## 2023-01-15 LAB — HEPATIC FUNCTION PANEL
ALT: 11 U/L (ref 7–35)
AST: 18 (ref 13–35)
Alkaline Phosphatase: 93 (ref 25–125)
Bilirubin, Total: 0.4

## 2023-01-15 LAB — TSH: TSH: 1.69 (ref 0.41–5.90)

## 2023-01-15 LAB — CBC: RBC: 2.98 — AB (ref 3.87–5.11)

## 2023-01-15 NOTE — Progress Notes (Signed)
 Provider:   Jackalyn Prayer Location:   [facility]    Place of Service:  Skilled care    PCP: Anastasio Duncans, DO Patient Care Team: Anastasio Duncans, DO as PCP - General (Family Medicine) [name], Aleene PARAS, MD as PCP - Cardiology (Cardiology)  Extended Emergency Contact Information Primary Emergency Contact: Werber,[name] Mobile Phone: [phone] Relation: Son Secondary Emergency Contact: Delores Alm RADDLE Address: [address]          [place], [place] Mobile Phone: [phone] Relation: Spouse  Code Status:  Goals of Care: Advanced Directive information    01/12/2023    3:46 PM  Advanced Directives  Does Patient Have a Medical Advance Directive? Yes  Type of Estate Agent of [place];Living will;Out of facility DNR (pink MOST or yellow form)  Does patient want to make changes to medical advance directive? No - Patient declined  Copy of Healthcare Power of Attorney in Chart? Yes - validated most recent copy scanned in chart (See row information)      No chief complaint on file.   HPI: Patient is a 89 y.o. female seen today for admission to skilled care from [facility]. Patient seen and examined in her room.  Her husband at bedside. Patient reports exertional shortness of breath but reports improvement in her breathing.  She is currently on 2 L nasal cannula.  Saturating 96% she is able to speak in full sentences, does not appear to be in distress. Patient is participating in physical therapy.  She is able to transfer herself and walk to the restroom independently. Patient denies chest pain, orthopnea, PND, lower extremity swelling, dizziness, runny nose, cough.  Abdominal pain-patient complains of generalized abdominal discomfort.  Complains of passing hard stools and straining with defecation. Denies blood or dark stools.  As per chart review patient was hospitalized  last month for hypoxic  respiratory failure sec to multifocal pneumonia.  Past Medical History:  Diagnosis Date   Anxiety    Aortic stenosis    a. mild by echo 2017.   Arthritis    Bradycardia    a. ? details not clear - in the past has had PVCs which have lowered effective HR count by pulse check.   Chronic diastolic CHF (congestive heart failure) (HCC)    Diastolic dysfunction    mild    Frequent PVCs    a. 48 Hour Holter 10/08/15 showed NSR and sinus tach with frequent PVCs, occasional bigeminy, total PVC burden 16.5%, lowest HR 59.   Hyperlipidemia    Hypertension    Hypertensive heart disease    Hypothyroidism    Kidney stones    Mild mitral regurgitation by prior echocardiogram    Tachycardia    history of tachycardia and bradycardia   Past Surgical History:  Procedure Laterality Date   APPENDECTOMY     CHOLECYSTECTOMY     EXTRACORPOREAL SHOCK WAVE LITHOTRIPSY     EYE SURGERY Bilateral    cataract surgery with lens implant   ORIF HUMERUS FRACTURE Right 10/31/2013   Procedure: Open Reduction Internal Fixation Right Proximal Humerus Fracture;  Surgeon: Oneil JAYSON Herald, MD;  Location: [facility] OR;  Service: Orthopedics;  Laterality: Right;   TONSILLECTOMY     TOTAL ABDOMINAL HYSTERECTOMY      reports that she has never smoked. She has never used smokeless tobacco. She reports that she does not drink alcohol and does not use drugs. Social History   Socioeconomic History  Marital status: Married    Spouse name: Not on file   Number of children: Not on file   Years of education: Not on file   Highest education level: Not on file  Occupational History   Not on file  Tobacco Use   Smoking status: Never   Smokeless tobacco: Never  Vaping Use   Vaping status: Never Used  Substance and Sexual Activity   Alcohol use: No   Drug use: No   Sexual activity: Not Currently  Other Topics Concern   Not on file  Social History Narrative   Not on file   Social Drivers of Health   Financial Resource  Strain: Not on file  Food Insecurity: No Food Insecurity (12/10/2022)   Hunger Vital Sign    Worried About Running Out of Food in the Last Year: Never true    Ran Out of Food in the Last Year: Never true  Transportation Needs: No Transportation Needs (12/10/2022)   PRAPARE - Administrator, Civil Service (Medical): No    Lack of Transportation (Non-Medical): No  Physical Activity: Not on file  Stress: Not on file  Social Connections: Not on file  Intimate Partner Violence: Not At Risk (12/10/2022)   Humiliation, Afraid, Rape, and Kick questionnaire    Fear of Current or Ex-Partner: No    Emotionally Abused: No    Physically Abused: No    Sexually Abused: No    Functional Status Survey:    Family History  Problem Relation Age of Onset   Emphysema Father    Heart failure Mother    Heart attack Brother    Cancer Brother        kidney   Stroke Brother     Health Maintenance  Topic Date Due   Zoster Vaccines- Shingrix (1 of 2) 05/23/1973   DEXA SCAN  Never done   INFLUENZA VACCINE  08/14/2022   COVID-19 Vaccine (6 - 2024-25 season) 09/14/2022   Medicare Annual Wellness (AWV)  02/04/2023   DTaP/Tdap/Td (3 - Td or Tdap) 01/26/2030   Pneumonia Vaccine 6+ Years old  Completed   HPV VACCINES  Aged Out    Allergies  Allergen Reactions   Diltiazem Hcl Er Beads Other (See Comments)    Dizziness (not present on the [facility] in 2024)    Outpatient Encounter Medications as of 01/15/2023  Medication Sig   acetaminophen  (TYLENOL ) 500 MG tablet Take 500 mg by mouth every 6 (six) hours as needed (for pain).   albuterol  (VENTOLIN  HFA) 108 (90 Base) MCG/ACT inhaler Inhale 1 puff into the lungs every 4 (four) hours as needed for wheezing or shortness of breath.   ALPRAZolam  (XANAX ) 0.25 MG tablet Take 0.5 tablets (0.125 mg total) by mouth 2 (two) times daily as needed for anxiety. (Patient not taking: Reported on 01/13/2023)   arformoterol  (BROVANA ) 15 MCG/2ML NEBU Take 2 mLs  (15 mcg total) by nebulization 2 (two) times daily.   budesonide  (PULMICORT ) 0.25 MG/2ML nebulizer solution Take 2 mLs (0.25 mg total) by nebulization 2 (two) times daily.   cholecalciferol  (VITAMIN D) 1000 units tablet Take 1,000 Units by mouth daily.   Emollient (AQUAPHOR HEALING BALM EX) Apply 1 application  topically See admin instructions. Apply to the lips 3 times a day   furosemide  (LASIX ) 20 MG tablet Take 1 tablet (20 mg total) by mouth daily.   ipratropium (ATROVENT ) 0.02 % nebulizer solution Take 2.5 mLs (0.5 mg total) by nebulization 2 (two) times daily.  ketoconazole (NIZORAL) 2 % shampoo Apply 1 Application topically See admin instructions. Shampoo in the morning on Wednesdays and Saturdays   levalbuterol  (XOPENEX ) 0.31 MG/3ML nebulizer solution Take 1 ampule by nebulization every 8 (eight) hours as needed for wheezing.   levalbuterol  (XOPENEX ) 0.63 MG/3ML nebulizer solution Take 3 mLs (0.63 mg total) by nebulization 2 (two) times daily.   levothyroxine  (SYNTHROID ) 75 MCG tablet Take 75 mcg by mouth daily before breakfast.   metoprolol  tartrate (LOPRESSOR ) 25 MG tablet Take 0.5 tablets (12.5 mg total) by mouth 2 (two) times daily.   Multiple Vitamins-Minerals (PRESERVISION AREDS 2+MULTI VIT) CAPS Take 1 capsule by mouth 2 (two) times daily.   nitroGLYCERIN  (NITROSTAT ) 0.4 MG SL tablet Place 0.4 mg under the tongue every 5 (five) minutes x 3 doses as needed for chest pain (call MD, if not effective).   pantoprazole  (PROTONIX ) 40 MG tablet Take 1 tablet (40 mg total) by mouth 2 (two) times daily.   potassium chloride  SA (KLOR-CON  M) 20 MEQ tablet Take 1 tablet (20 mEq total) by mouth daily.   pravastatin  (PRAVACHOL ) 20 MG tablet Take 20 mg by mouth every evening.   sertraline  (ZOLOFT ) 100 MG tablet Take 150 mg by mouth in the morning.   Zinc Oxide 20 % PSTE Apply topically. Apply to peri/buttocks topically as needed for After incontinent episodes as preventative unsupervised  self-administration May keep at bedside (Patient not taking: Reported on 01/12/2023)   No facility-administered encounter medications on file as of 01/15/2023.    Review of Systems  Constitutional:  Negative for fever.  Respiratory:  Positive for shortness of breath (exertion). Negative for cough and wheezing.   Cardiovascular:  Negative for chest pain, palpitations and leg swelling.  Gastrointestinal:  Positive for abdominal pain and constipation. Negative for abdominal distention, blood in stool, diarrhea, nausea and vomiting.  Genitourinary:  Negative for dysuria, frequency and urgency.  Neurological:  Negative for dizziness, weakness and numbness.  Psychiatric/Behavioral:  Negative for confusion.     Vitals:   01/15/23 0937  BP: (!) 127/56  Pulse: (!) 103  Resp: 16  Temp: 97.7 F (36.5 C)  SpO2: 96%  Weight: 135 lb 8 oz (61.5 kg)   Body mass index is 23.26 kg/m. Physical Exam Constitutional:      Appearance: Normal appearance.  HENT:     Head: Normocephalic and atraumatic.  Cardiovascular:     Rate and Rhythm: Normal rate and regular rhythm.  Pulmonary:     Effort: Pulmonary effort is normal. No respiratory distress.     Breath sounds: No wheezing.     Comments: Coarse breath sounds Rt lung base  Abdominal:     General: Bowel sounds are normal. There is no distension.     Tenderness: There is abdominal tenderness (mid  belly pain). There is no guarding or rebound.     Comments:    Musculoskeletal:        General: No swelling or tenderness.  Neurological:     Mental Status: She is alert. Mental status is at baseline.     Sensory: No sensory deficit.     Motor: No weakness.     Labs reviewed: Basic Metabolic Panel: Recent Labs    12/13/22 0432 12/14/22 0736 12/15/22 0401  NA 132* 135 135  K 3.1* 3.3* 3.6  CL 97* 98 101  CO2 24 27 24   GLUCOSE 103* 101* 99  BUN 22 16 16   CREATININE 0.79  0.76 0.70 0.71  CALCIUM 8.4* 8.4* 8.6*  Liver Function  Tests: Recent Labs    12/10/22 0424  AST 28  ALT 32  ALKPHOS 114  BILITOT 1.2*  PROT 7.1  ALBUMIN 2.6*   No results for input(s): LIPASE, AMYLASE in the last 8760 hours. No results for input(s): AMMONIA in the last 8760 hours. CBC: Recent Labs    12/13/22 0432 12/14/22 0736 12/15/22 0401  WBC 22.8* 22.0* 18.2*  HGB 9.7* 9.9* 9.4*  HCT 30.0* 30.4* 29.0*  MCV 99.7 97.7 100.0  PLT 441* 463* 462*   Cardiac Enzymes: No results for input(s): CKTOTAL, CKMB, CKMBINDEX, TROPONINI in the last 8760 hours. BNP: Invalid input(s): POCBNP Lab Results  Component Value Date   HGBA1C (H) 10/02/2009    6.0 (NOTE)                                                                       According to the ADA Clinical Practice Recommendations for 2011, when HbA1c is used as a screening test:   >=6.5%   Diagnostic of Diabetes Mellitus           (if abnormal result  is confirmed)  5.7-6.4%   Increased risk of developing Diabetes Mellitus  References:Diagnosis and Classification of Diabetes Mellitus,Diabetes Care,2011,34(Suppl 1):S62-S69 and Standards of Medical Care in         Diabetes - 2011,Diabetes Care,2011,34  (Suppl 1):S11-S61.   Lab Results  Component Value Date   TSH 0.877 02/24/2017   No results found for: VITAMINB12 No results found for: FOLATE No results found for: IRON, TIBC, FERRITIN  Imaging and Procedures obtained prior to SNF admission: DG CHEST PORT 1 VIEW Result Date: 12/10/2022 CLINICAL DATA:  Respiratory compromise. Increased shortness of breath and hypoxia EXAM: PORTABLE CHEST 1 VIEW COMPARISON:  12/09/2022 FINDINGS: Chronic lung disease with diffuse interstitial reticulation. Progression of patchy bilateral airspace disease especially at the right apex. No effusion or pneumothorax. Stable heart size and mediastinal contours. IMPRESSION: Worsening pneumonia superimposed on interstitial lung disease. Electronically Signed   By: Dorn Roulette M.D.    On: 12/10/2022 04:29   DG Chest Port 1 View Result Date: 12/09/2022 CLINICAL DATA:  Three day history of productive cough, shortness of breath, decreased appetite EXAM: PORTABLE CHEST 1 VIEW COMPARISON:  Chest radiograph dated 01/31/2021, CT chest dated 07/22/2021 FINDINGS: Normal lung volumes. Increased bilateral interstitial and patchy opacities. Blunting of the bilateral costophrenic angles. No pneumothorax. The heart size and mediastinal contours are within normal limits. Partially imaged right proximal humeral fixation hardware. IMPRESSION: 1. Increased bilateral interstitial and patchy opacities, which may represent pulmonary edema or multifocal pneumonia. 2. Blunting of the bilateral costophrenic angles, which may represent small pleural effusions. Electronically Signed   By: Limin  Xu M.D.   On: 12/09/2022 16:51    Assessment/Plan  Multifocal pneumonia ILD  Pt is able to speak in full sentences, does not appear to be in  distress O2 sat 96% on RA  Lungs - coarse breath sounds Rt lung base Will repeat chest x ray in 2 weeks  As per pulm conservative management  Cont with arformeterol, xopenox, budesonide    GAD  Cont with xanax  prn  Cont with zoloft    Tachycardia Cont with metoprolol    Diastolic heart failure  Aortic stenosis  Cont with lasix    Abdominal discomfort  Mid belly discomfort  Will get x ray KUB  Will start colace, miralax  Hypothyroidism  Lab Results  Component Value Date   TSH 0.877 02/24/2017   Cont with synthyroid  Weakness Cont with PT/OT    Care plan discussed with the nursing staff   35 min Total time spent for obtaining history,  performing a medically appropriate examination and evaluation, reviewing the tests,ordering  tests,  documenting clinical information in the electronic or other health record, independently interpreting results ,care coordination (not separately reported)

## 2023-01-20 ENCOUNTER — Encounter: Payer: Self-pay | Admitting: Nurse Practitioner

## 2023-01-20 ENCOUNTER — Non-Acute Institutional Stay (SKILLED_NURSING_FACILITY): Payer: Medicare Other | Admitting: Nurse Practitioner

## 2023-01-20 DIAGNOSIS — D649 Anemia, unspecified: Secondary | ICD-10-CM

## 2023-01-20 DIAGNOSIS — F419 Anxiety disorder, unspecified: Secondary | ICD-10-CM

## 2023-01-20 DIAGNOSIS — R000 Tachycardia, unspecified: Secondary | ICD-10-CM

## 2023-01-20 DIAGNOSIS — E039 Hypothyroidism, unspecified: Secondary | ICD-10-CM | POA: Diagnosis not present

## 2023-01-20 DIAGNOSIS — I350 Nonrheumatic aortic (valve) stenosis: Secondary | ICD-10-CM

## 2023-01-20 DIAGNOSIS — J849 Interstitial pulmonary disease, unspecified: Secondary | ICD-10-CM

## 2023-01-20 DIAGNOSIS — I5032 Chronic diastolic (congestive) heart failure: Secondary | ICD-10-CM | POA: Diagnosis not present

## 2023-01-20 NOTE — Assessment & Plan Note (Signed)
 prn Alprazolam, Sertraline, stable.

## 2023-01-20 NOTE — Progress Notes (Signed)
 Location:  [facility] Room Number: [id number] Place of Service:  SNF [phone])  Provider: Adline Lysle OLEGARIO CARROLYN  PCP: Anastasio Duncans, DO (Inactive) Patient Care Team: Anastasio Duncans, DO (Inactive) as PCP - General (Family Medicine) [name], Aleene PARAS, MD as PCP - Cardiology (Cardiology)  Extended Emergency Contact Information Primary Emergency Contact: Orman,[name] Mobile Phone: [phone] Relation: Son Secondary Emergency Contact: Delores Alm RADDLE Address: [address]          [place], [place] Mobile Phone: [phone] Relation: Spouse  Code Status: DNR Goals of care:  Advanced Directive information    01/20/2023    3:19 PM  Advanced Directives  Does Patient Have a Medical Advance Directive? Yes  Type of Estate Agent of [place];Living will;Out of facility DNR (pink MOST or yellow form)  Does patient want to make changes to medical advance directive? No - Patient declined  Copy of Healthcare Power of Attorney in Chart? Yes - validated most recent copy scanned in chart (See row information)     Allergies  Allergen Reactions   Diltiazem Hcl Er Beads Other (See Comments)    Dizziness (not present on the [facility] in 2024)    Chief Complaint  Patient presents with   Discharge Note    Discharge from snf.    HPI:  89 y.o. female  with PMH significant of tachycardia, anxiety, CHF, ILD, severe AS, anemia, hypothyroidism, and GERD  The patient has recovered from PNA, received therapy, and regained physical strength, and ADL function. She is ready to transfer to [facility].    PNA, still cough, using Nebs, O2, wbc 18.2 12/15/22>>8.7 01/15/23             Tachycardia, HR 100s intermittently, on Metoprolol , EKG showed SR             Anxiety, prn Alprazolam , Sertraline .              CHF, edema BLE, on Furosemide , EF 60-65%12/11/22, Bun/creat 17/0.7 01/15/23             Interstitial lung disease, followed by Pulmonology, on O2,  Albuterol  HFA, Brovana , Pulmicort , Atrovent  Neb, Xopenex  Neb             Severe AS conservative management per cardiology             Anemia, chronic, Iron 32, Hgb 9.3 01/15/23             Hypothyroidism, on Levothyroxine , TSH 1.69 01/15/23             GERD, on Pantoprazole .     Past Medical History:  Diagnosis Date   Anxiety    Aortic stenosis    a. mild by echo 2017.   Arthritis    Bradycardia    a. ? details not clear - in the past has had PVCs which have lowered effective HR count by pulse check.   Chronic diastolic CHF (congestive heart failure) (HCC)    Diastolic dysfunction    mild    Frequent PVCs    a. 48 Hour Holter 10/08/15 showed NSR and sinus tach with frequent PVCs, occasional bigeminy, total PVC burden 16.5%, lowest HR 59.   Hyperlipidemia    Hypertension    Hypertensive heart disease    Hypothyroidism    Kidney stones    Mild mitral regurgitation by prior echocardiogram    Tachycardia    history of tachycardia and bradycardia    Past Surgical History:  Procedure Laterality Date   APPENDECTOMY     CHOLECYSTECTOMY     EXTRACORPOREAL SHOCK WAVE LITHOTRIPSY     EYE SURGERY Bilateral    cataract surgery with lens implant   ORIF HUMERUS FRACTURE Right 10/31/2013   Procedure: Open Reduction Internal Fixation Right Proximal Humerus Fracture;  Surgeon: Oneil JAYSON Herald, MD;  Location: [facility] OR;  Service: Orthopedics;  Laterality: Right;   TONSILLECTOMY     TOTAL ABDOMINAL HYSTERECTOMY        reports that she has never smoked. She has never used smokeless tobacco. She reports that she does not drink alcohol and does not use drugs. Social History   Socioeconomic History   Marital status: Married    Spouse name: Not on file   Number of children: Not on file   Years of education: Not on file   Highest education level: Not on file  Occupational History   Not on file  Tobacco Use   Smoking status: Never   Smokeless tobacco: Never  Vaping Use   Vaping status: Never Used   Substance and Sexual Activity   Alcohol use: No   Drug use: No   Sexual activity: Not Currently  Other Topics Concern   Not on file  Social History Narrative   Not on file   Social Drivers of Health   Financial Resource Strain: Not on file  Food Insecurity: No Food Insecurity (12/10/2022)   Hunger Vital Sign    Worried About Running Out of Food in the Last Year: Never true    Ran Out of Food in the Last Year: Never true  Transportation Needs: No Transportation Needs (12/10/2022)   PRAPARE - Administrator, Civil Service (Medical): No    Lack of Transportation (Non-Medical): No  Physical Activity: Not on file  Stress: Not on file  Social Connections: Not on file  Intimate Partner Violence: Not At Risk (12/10/2022)   Humiliation, Afraid, Rape, and Kick questionnaire    Fear of Current or Ex-Partner: No    Emotionally Abused: No    Physically Abused: No    Sexually Abused: No   Functional Status Survey:    Allergies  Allergen Reactions   Diltiazem Hcl Er Beads Other (See Comments)    Dizziness (not present on the [facility] in 2024)    Pertinent  Health Maintenance Due  Topic Date Due   DEXA SCAN  Never done   INFLUENZA VACCINE  08/14/2022    Medications: Outpatient Encounter Medications as of 01/20/2023  Medication Sig   acetaminophen  (TYLENOL ) 500 MG tablet Take 500 mg by mouth every 6 (six) hours as needed (for pain).   albuterol  (VENTOLIN  HFA) 108 (90 Base) MCG/ACT inhaler Inhale 1 puff into the lungs every 4 (four) hours as needed for wheezing or shortness of breath.   ALPRAZolam  (XANAX ) 0.25 MG tablet Take 0.5 tablets (0.125 mg total) by mouth 2 (two) times daily as needed for anxiety.   arformoterol  (BROVANA ) 15 MCG/2ML NEBU Take 2 mLs (15 mcg total) by nebulization 2 (two) times daily.   budesonide  (PULMICORT ) 0.25 MG/2ML nebulizer solution Take 2 mLs (0.25 mg total) by nebulization 2 (two) times daily.   cholecalciferol  (VITAMIN D) 1000 units tablet Take  1,000 Units by mouth daily.   Emollient (AQUAPHOR HEALING BALM EX) Apply 1 application  topically See admin instructions. Apply to the lips 3 times a day   furosemide  (LASIX ) 20 MG tablet Take 1 tablet (20 mg total) by mouth daily.  ipratropium (ATROVENT ) 0.02 % nebulizer solution Take 2.5 mLs (0.5 mg total) by nebulization 2 (two) times daily.   ketoconazole (NIZORAL) 2 % shampoo Apply 1 Application topically See admin instructions. Shampoo in the morning on Wednesdays and Saturdays   levalbuterol  (XOPENEX ) 0.31 MG/3ML nebulizer solution Take 1 ampule by nebulization every 8 (eight) hours as needed for wheezing.   levalbuterol  (XOPENEX ) 0.63 MG/3ML nebulizer solution Take 3 mLs (0.63 mg total) by nebulization 2 (two) times daily.   levothyroxine  (SYNTHROID ) 75 MCG tablet Take 75 mcg by mouth daily before breakfast.   metoprolol  tartrate (LOPRESSOR ) 25 MG tablet Take 0.5 tablets (12.5 mg total) by mouth 2 (two) times daily.   Multiple Vitamins-Minerals (PRESERVISION AREDS 2+MULTI VIT) CAPS Take 1 capsule by mouth 2 (two) times daily.   nitroGLYCERIN  (NITROSTAT ) 0.4 MG SL tablet Place 0.4 mg under the tongue every 5 (five) minutes x 3 doses as needed for chest pain (call MD, if not effective).   pantoprazole  (PROTONIX ) 40 MG tablet Take 1 tablet (40 mg total) by mouth 2 (two) times daily.   potassium chloride  SA (KLOR-CON  M) 20 MEQ tablet Take 1 tablet (20 mEq total) by mouth daily.   pravastatin  (PRAVACHOL ) 20 MG tablet Take 20 mg by mouth every evening.   sertraline  (ZOLOFT ) 100 MG tablet Take 150 mg by mouth in the morning.   Zinc Oxide 20 % PSTE Apply topically. Apply to peri/buttocks topically as needed for After incontinent episodes as preventative unsupervised self-administration May keep at bedside (Patient not taking: Reported on 01/12/2023)   No facility-administered encounter medications on file as of 01/20/2023.    Review of Systems  Constitutional:  Negative for appetite change,  fatigue and fever.  HENT:  Negative for congestion, trouble swallowing and voice change.   Eyes:  Negative for visual disturbance.  Respiratory:  Positive for cough and shortness of breath. Negative for chest tightness and wheezing.   Cardiovascular:  Positive for leg swelling. Negative for chest pain and palpitations.  Gastrointestinal:  Negative for abdominal pain, constipation, nausea and vomiting.  Genitourinary:  Negative for dysuria, frequency and urgency.       Nocturnal urination 1x usually.   Musculoskeletal:  Positive for arthralgias and gait problem.  Skin:  Negative for color change.  Neurological:  Negative for tremors, speech difficulty and headaches.  Psychiatric/Behavioral:  Positive for sleep disturbance. Negative for confusion. The patient is not nervous/anxious.        Woke up 3-4 am sometimes.     Vitals:   01/20/23 1515  BP: (!) 127/56  Pulse: (!) 103  Resp: 16  Temp: 97.7 F (36.5 C)  SpO2: 98%  Weight: 133 lb 11.2 oz (60.6 kg)  Height: 5' 4 (1.626 m)   Body mass index is 22.95 kg/m. Physical Exam Vitals and nursing note reviewed.  Constitutional:      Appearance: Normal appearance.  HENT:     Head: Normocephalic and atraumatic.     Nose: Nose normal.     Mouth/Throat:     Mouth: Mucous membranes are moist.  Cardiovascular:     Rate and Rhythm: Regular rhythm. Tachycardia present.     Heart sounds: Murmur heard.  Pulmonary:     Breath sounds: Rales present. No wheezing or rhonchi.     Comments: Posterior mid to lower rales, DOE, O2 desaturation with exertion, able to speak a sentence in full.  Abdominal:     General: Bowel sounds are normal.     Palpations: Abdomen is  soft.     Tenderness: There is no abdominal tenderness.  Musculoskeletal:        General: Normal range of motion.     Cervical back: Normal range of motion and neck supple.     Right lower leg: Edema present.     Left lower leg: Edema present.     Comments: 1+ edema BLE   Skin:    General: Skin is warm and dry.  Neurological:     General: No focal deficit present.     Mental Status: She is alert and oriented to person, place, and time. Mental status is at baseline.     Gait: Gait abnormal.  Psychiatric:        Mood and Affect: Mood normal.        Behavior: Behavior normal.        Thought Content: Thought content normal.        Judgment: Judgment normal.     Labs reviewed: Basic Metabolic Panel: Recent Labs    12/13/22 0432 12/14/22 0736 12/15/22 0401 01/15/23 0000  NA 132* 135 135 138  K 3.1* 3.3* 3.6 4.6  CL 97* 98 101 103  CO2 24 27 24  27*  GLUCOSE 103* 101* 99  --   BUN 22 16 16 17   CREATININE 0.79  0.76 0.70 0.71 0.7  CALCIUM 8.4* 8.4* 8.6* 9.1   Liver Function Tests: Recent Labs    12/10/22 0424 01/15/23 0000  AST 28 18  ALT 32 11  ALKPHOS 114 93  BILITOT 1.2*  --   PROT 7.1  --   ALBUMIN 2.6* 3.3*   No results for input(s): LIPASE, AMYLASE in the last 8760 hours. No results for input(s): AMMONIA in the last 8760 hours. CBC: Recent Labs    12/13/22 0432 12/14/22 0736 12/15/22 0401 01/15/23 0000  WBC 22.8* 22.0* 18.2* 8.7  NEUTROABS  --   --   --  5,559.00  HGB 9.7* 9.9* 9.4* 9.3*  HCT 30.0* 30.4* 29.0* 29*  MCV 99.7 97.7 100.0  --   PLT 441* 463* 462* 377   Cardiac Enzymes: No results for input(s): CKTOTAL, CKMB, CKMBINDEX, TROPONINI in the last 8760 hours. BNP: Invalid input(s): POCBNP CBG: Recent Labs    12/10/22 0251  GLUCAP 160*    Procedures and Imaging Studies During Stay: No results found.  Assessment/Plan:   Tachycardia HR 100s intermittently, on Metoprolol , EKG showed SR  Anxiety prn Alprazolam , Sertraline , stable.   Chronic diastolic CHF (congestive heart failure) (HCC) edema BLE, on Furosemide , EF 60-65%12/11/22, Bun/creat 17/0.7 01/15/23  Interstitial lung disease (HCC)  followed by Pulmonology, on O2, Albuterol  HFA, Brovana , Pulmicort , Atrovent  Neb, Xopenex   Neb  Aortic stenosis Severe AS conservative management per cardiology  Chronic anemia chronic, Iron 32, Hgb 9.3 01/15/23  Hypothyroidism on Levothyroxine , TSH 1.69 01/15/23  GERD (gastroesophageal reflux disease) on Pantoprazole .      Patient is being discharged with the following home health services:    Patient is being discharged with the following durable medical equipment:    Patient has been advised to f/u with their PCP in 1-2 weeks to for a transitions of care visit.  Social services at their facility was responsible for arranging this appointment.  Pt was provided with adequate prescriptions of noncontrolled medications to reach the scheduled appointment .  For controlled substances, a limited supply was provided as appropriate for the individual patient.  If the pt normally receives these medications from a pain clinic or has a community education officer  with another physician, these medications should be received from that clinic or physician only).    Future labs/tests needed:  prn

## 2023-01-20 NOTE — Assessment & Plan Note (Signed)
 on Levothyroxine, TSH 1.69 01/15/23

## 2023-01-20 NOTE — Assessment & Plan Note (Signed)
on Pantoprazole °

## 2023-01-20 NOTE — Assessment & Plan Note (Signed)
 HR 100s intermittently, on Metoprolol, EKG showed SR

## 2023-01-20 NOTE — Assessment & Plan Note (Signed)
 Severe AS conservative management per cardiology

## 2023-01-20 NOTE — Assessment & Plan Note (Signed)
 followed by Pulmonology, on O2, Albuterol HFA, Brovana, Pulmicort, Atrovent Neb, Xopenex Neb

## 2023-01-20 NOTE — Assessment & Plan Note (Signed)
 chronic, Iron 32, Hgb 9.3 01/15/23

## 2023-01-20 NOTE — Assessment & Plan Note (Signed)
 edema BLE, on Furosemide, EF 60-65%12/11/22, Bun/creat 17/0.7 01/15/23

## 2023-01-22 DIAGNOSIS — M6281 Muscle weakness (generalized): Secondary | ICD-10-CM | POA: Diagnosis not present

## 2023-01-22 DIAGNOSIS — R278 Other lack of coordination: Secondary | ICD-10-CM | POA: Diagnosis not present

## 2023-01-22 DIAGNOSIS — R1312 Dysphagia, oropharyngeal phase: Secondary | ICD-10-CM | POA: Diagnosis not present

## 2023-01-22 DIAGNOSIS — R2681 Unsteadiness on feet: Secondary | ICD-10-CM | POA: Diagnosis not present

## 2023-01-23 ENCOUNTER — Non-Acute Institutional Stay (SKILLED_NURSING_FACILITY): Payer: Self-pay | Admitting: Sports Medicine

## 2023-01-23 ENCOUNTER — Encounter: Payer: Self-pay | Admitting: Sports Medicine

## 2023-01-23 DIAGNOSIS — K529 Noninfective gastroenteritis and colitis, unspecified: Secondary | ICD-10-CM | POA: Diagnosis not present

## 2023-01-23 DIAGNOSIS — R634 Abnormal weight loss: Secondary | ICD-10-CM | POA: Diagnosis not present

## 2023-01-23 DIAGNOSIS — J449 Chronic obstructive pulmonary disease, unspecified: Secondary | ICD-10-CM | POA: Diagnosis not present

## 2023-01-23 NOTE — Progress Notes (Signed)
 Location:  [facility] Room Number: [id number] Place of Service:  SNF (31) Provider:  Sherlynn Madden, MD    Patient Care Team: Anastasio Duncans, DO (Inactive) as PCP - General (Family Medicine) [name], Aleene PARAS, MD as PCP - Cardiology (Cardiology)  Extended Emergency Contact Information Primary Emergency Contact: Cullens,[name] Mobile Phone: [phone] Relation: Son Secondary Emergency Contact: Delores Alm RADDLE Address: [address]          [place], [place] Mobile Phone: [phone] Relation: Spouse  Code Status:  DNR Goals of care: Advanced Directive information    01/23/2023   11:34 AM  Advanced Directives  Does Patient Have a Medical Advance Directive? Yes  Type of Estate Agent of [place];Living will;Out of facility DNR (pink MOST or yellow form)  Does patient want to make changes to medical advance directive? No - Patient declined  Copy of Healthcare Power of Attorney in Chart? Yes - validated most recent copy scanned in chart (See row information)  Pre-existing out of facility DNR order (yellow form or pink MOST form) Yellow form placed in chart (order not valid for inpatient use)     Chief Complaint  Patient presents with   Acute Visit     nausea, vomiting    HPI:  Pt is a 89 y.o. female seen today for an acute visit for  nausea, vomiting, diarrhea.  Pt reports that her symptoms started last night. Patient has been experiencing abdominal discomfort, described as an ache rather than a sharp pain. The patient denies any dysuria but reports a change in the normal amount of urine output. The patient also expresses a persistent feeling of nausea and a continued urge to vomit. Pt reports poor appetite, ate a small amount of oatmeal and coffee for breakfast. Pt had 3 episodes of watery stools The patient also reports experiencing a headache, described as a diffuse, non-excruciating discomfort  throughout the head.         Past Medical History:  Diagnosis Date   Anxiety    Aortic stenosis    a. mild by echo 2017.   Arthritis    Bradycardia    a. ? details not clear - in the past has had PVCs which have lowered effective HR count by pulse check.   Chronic diastolic CHF (congestive heart failure) (HCC)    Diastolic dysfunction    mild    Frequent PVCs    a. 48 Hour Holter 10/08/15 showed NSR and sinus tach with frequent PVCs, occasional bigeminy, total PVC burden 16.5%, lowest HR 59.   Hyperlipidemia    Hypertension    Hypertensive heart disease    Hypothyroidism    Kidney stones    Mild mitral regurgitation by prior echocardiogram    Tachycardia    history of tachycardia and bradycardia   Past Surgical History:  Procedure Laterality Date   APPENDECTOMY     CHOLECYSTECTOMY     EXTRACORPOREAL SHOCK WAVE LITHOTRIPSY     EYE SURGERY Bilateral    cataract surgery with lens implant   ORIF HUMERUS FRACTURE Right 10/31/2013   Procedure: Open Reduction Internal Fixation Right Proximal Humerus Fracture;  Surgeon: Oneil JAYSON Herald, MD;  Location: [facility] OR;  Service: Orthopedics;  Laterality: Right;   TONSILLECTOMY     TOTAL ABDOMINAL HYSTERECTOMY      Allergies  Allergen Reactions   Diltiazem Hcl Er Beads Other (See Comments)    Dizziness (not present on the [facility] in  2024)    Outpatient Encounter Medications as of 01/23/2023  Medication Sig   acetaminophen  (TYLENOL ) 500 MG tablet Take 500 mg by mouth every 6 (six) hours as needed (for pain).   albuterol  (VENTOLIN  HFA) 108 (90 Base) MCG/ACT inhaler Inhale 1 puff into the lungs every 4 (four) hours as needed for wheezing or shortness of breath.   ALPRAZolam  (XANAX ) 0.25 MG tablet Take 0.5 tablets (0.125 mg total) by mouth 2 (two) times daily as needed for anxiety.   arformoterol  (BROVANA ) 15 MCG/2ML NEBU Take 2 mLs (15 mcg total) by nebulization 2 (two) times daily.   budesonide  (PULMICORT ) 0.25 MG/2ML nebulizer solution Take 2  mLs (0.25 mg total) by nebulization 2 (two) times daily.   cholecalciferol  (VITAMIN D) 1000 units tablet Take 1,000 Units by mouth daily.   Emollient (AQUAPHOR HEALING BALM EX) Apply 1 application  topically See admin instructions. Apply to the lips 3 times a day   furosemide  (LASIX ) 20 MG tablet Take 1 tablet (20 mg total) by mouth daily.   ipratropium (ATROVENT ) 0.02 % nebulizer solution Take 2.5 mLs (0.5 mg total) by nebulization 2 (two) times daily.   ketoconazole (NIZORAL) 2 % shampoo Apply 1 Application topically See admin instructions. Shampoo in the morning on Wednesdays and Saturdays   levalbuterol  (XOPENEX ) 0.31 MG/3ML nebulizer solution Take 1 ampule by nebulization every 8 (eight) hours as needed for wheezing.   levalbuterol  (XOPENEX ) 0.63 MG/3ML nebulizer solution Take 3 mLs (0.63 mg total) by nebulization 2 (two) times daily.   levothyroxine  (SYNTHROID ) 75 MCG tablet Take 75 mcg by mouth daily before breakfast.   metoprolol  tartrate (LOPRESSOR ) 25 MG tablet Take 0.5 tablets (12.5 mg total) by mouth 2 (two) times daily.   Multiple Vitamins-Minerals (PRESERVISION AREDS 2+MULTI VIT) CAPS Take 1 capsule by mouth 2 (two) times daily.   nitroGLYCERIN  (NITROSTAT ) 0.4 MG SL tablet Place 0.4 mg under the tongue every 5 (five) minutes x 3 doses as needed for chest pain (call MD, if not effective).   potassium chloride  SA (KLOR-CON  M) 20 MEQ tablet Take 1 tablet (20 mEq total) by mouth daily.   pravastatin  (PRAVACHOL ) 20 MG tablet Take 20 mg by mouth every evening.   sertraline  (ZOLOFT ) 100 MG tablet Take 150 mg by mouth in the morning.   pantoprazole  (PROTONIX ) 40 MG tablet Take 1 tablet (40 mg total) by mouth 2 (two) times daily.   Zinc Oxide 20 % PSTE Apply topically. Apply to peri/buttocks topically as needed for After incontinent episodes as preventative unsupervised self-administration May keep at bedside (Patient not taking: Reported on 01/12/2023)   No facility-administered encounter  medications on file as of 01/23/2023.    Review of Systems  Constitutional:  Negative for fever.  HENT:  Negative for sinus pressure and sore throat.   Respiratory:  Negative for cough, shortness of breath and wheezing.   Cardiovascular:  Negative for chest pain, palpitations and leg swelling.  Gastrointestinal:  Positive for abdominal pain, diarrhea, nausea and vomiting. Negative for abdominal distention, blood in stool and constipation.  Genitourinary:  Negative for dysuria, frequency and urgency.  Neurological:  Positive for headaches. Negative for dizziness.    Immunization History  Administered Date(s) Administered   Fluad Quad(high Dose 65+) 10/15/2022   Influenza Split 09/15/2008, 10/11/2009, 09/09/2013, 11/17/2017, 10/29/2020   Influenza, High Dose Seasonal PF 10/15/2015, 10/13/2016, 11/17/2017, 10/28/2018   Moderna Covid-19 Fall Seasonal Vaccine 43yrs & older 10/29/2022   Moderna Sars-Covid-2 Vaccination 01/17/2019, 02/14/2019, 11/16/2019, 06/12/2020   Pfizer Covid-19  Vaccine Bivalent Booster 39yrs & up 10/02/2020   Pneumococcal Conjugate-13 08/01/2013   Pneumococcal Polysaccharide-23 03/05/1994   Td 06/26/2005   Tdap 01/27/2020   Zoster, Live 11/27/2011, 01/27/2020, 07/30/2020   Pertinent  Health Maintenance Due  Topic Date Due   DEXA SCAN  Never done   INFLUENZA VACCINE  Completed      02/20/2018    3:53 PM 11/15/2020    3:16 PM 01/31/2021    5:46 PM  Fall Risk  (RETIRED) Patient Fall Risk Level Low fall risk High fall risk High fall risk   Functional Status Survey:    Vitals:   01/23/23 1125  BP: (!) 123/54  Pulse: 81  Resp: 18  Temp: 97.6 F (36.4 C)  SpO2: 96%  Weight: 130 lb 12.8 oz (59.3 kg)  Height: 5' 2 (1.575 m)   Body mass index is 23.92 kg/m. Physical Exam Constitutional:      Appearance: Normal appearance.  HENT:     Head: Normocephalic and atraumatic.  Cardiovascular:     Rate and Rhythm: Normal rate and regular rhythm.  Pulmonary:      Effort: Pulmonary effort is normal. No respiratory distress.     Breath sounds: Normal breath sounds. No wheezing.  Abdominal:     General: Bowel sounds are normal. There is no distension.     Tenderness: There is no abdominal tenderness. There is no guarding or rebound.     Comments:    Musculoskeletal:        General: No swelling or tenderness.  Neurological:     Mental Status: She is alert. Mental status is at baseline.     Sensory: No sensory deficit.     Motor: No weakness.     Labs reviewed: Recent Labs    12/13/22 0432 12/14/22 0736 12/15/22 0401 01/15/23 0000  NA 132* 135 135 138  K 3.1* 3.3* 3.6 4.6  CL 97* 98 101 103  CO2 24 27 24  27*  GLUCOSE 103* 101* 99  --   BUN 22 16 16 17   CREATININE 0.79  0.76 0.70 0.71 0.7  CALCIUM 8.4* 8.4* 8.6* 9.1   Recent Labs    12/10/22 0424 01/15/23 0000  AST 28 18  ALT 32 11  ALKPHOS 114 93  BILITOT 1.2*  --   PROT 7.1  --   ALBUMIN 2.6* 3.3*   Recent Labs    12/13/22 0432 12/14/22 0736 12/15/22 0401 01/15/23 0000  WBC 22.8* 22.0* 18.2* 8.7  NEUTROABS  --   --   --  5,559.00  HGB 9.7* 9.9* 9.4* 9.3*  HCT 30.0* 30.4* 29.0* 29*  MCV 99.7 97.7 100.0  --   PLT 441* 463* 462* 377   Lab Results  Component Value Date   TSH 1.69 01/15/2023   Lab Results  Component Value Date   HGBA1C (H) 10/02/2009    6.0 (NOTE)                                                                       According to the ADA Clinical Practice Recommendations for 2011, when HbA1c is used as a screening test:   >=6.5%   Diagnostic of Diabetes Mellitus           (  if abnormal result  is confirmed)  5.7-6.4%   Increased risk of developing Diabetes Mellitus  References:Diagnosis and Classification of Diabetes Mellitus,Diabetes Care,2011,34(Suppl 1):S62-S69 and Standards of Medical Care in         Diabetes - 2011,Diabetes Care,2011,34  (Suppl 1):S11-S61.   Lab Results  Component Value Date   CHOL 184 02/24/2017   HDL 53 02/24/2017   LDLCALC  95 02/24/2017   TRIG 179 (H) 02/24/2017   CHOLHDL 3.5 02/24/2017    Significant Diagnostic Results in last 30 days:  No results found.  Assessment/Plan  Gastroenteritis Acute onset of nausea, vomiting, and diarrhea. Suspected viral etiology. No signs of dehydration or hemodynamic instability at this time. -Administer Zofran  for nausea. -Administer Imodium for diarrhea. -Check vital signs every shift. -Obtain labs to assess for electrolyte imbalances and dehydration. -If symptoms persist or patient becomes hemodynamically unstable, transfer to the emergency department.  Headache Diffuse, non-excruciating headache. No associated dizziness or lightheadedness.  Cont with tylenol  for pain      General Health Maintenance -Reschedule missed cardiology appointment in approximately two weeks.    Care plan discussed with the nursing staff  30 minTotal time spent for obtaining history,  performing a medically appropriate examination and evaluation, reviewing the tests,ordering  tests,  documenting clinical information in the electronic or other health record, independently interpreting results ,care coordination (not separately reported)

## 2023-01-26 ENCOUNTER — Encounter: Payer: Self-pay | Admitting: Sports Medicine

## 2023-01-26 DIAGNOSIS — R278 Other lack of coordination: Secondary | ICD-10-CM | POA: Diagnosis not present

## 2023-01-26 DIAGNOSIS — M6281 Muscle weakness (generalized): Secondary | ICD-10-CM | POA: Diagnosis not present

## 2023-01-26 DIAGNOSIS — R2681 Unsteadiness on feet: Secondary | ICD-10-CM | POA: Diagnosis not present

## 2023-01-26 DIAGNOSIS — R1312 Dysphagia, oropharyngeal phase: Secondary | ICD-10-CM | POA: Diagnosis not present

## 2023-01-27 DIAGNOSIS — R278 Other lack of coordination: Secondary | ICD-10-CM | POA: Diagnosis not present

## 2023-01-27 DIAGNOSIS — L821 Other seborrheic keratosis: Secondary | ICD-10-CM | POA: Diagnosis not present

## 2023-01-27 DIAGNOSIS — L219 Seborrheic dermatitis, unspecified: Secondary | ICD-10-CM | POA: Diagnosis not present

## 2023-01-27 DIAGNOSIS — L570 Actinic keratosis: Secondary | ICD-10-CM | POA: Diagnosis not present

## 2023-01-27 DIAGNOSIS — R2681 Unsteadiness on feet: Secondary | ICD-10-CM | POA: Diagnosis not present

## 2023-01-27 DIAGNOSIS — M6281 Muscle weakness (generalized): Secondary | ICD-10-CM | POA: Diagnosis not present

## 2023-01-27 DIAGNOSIS — R1312 Dysphagia, oropharyngeal phase: Secondary | ICD-10-CM | POA: Diagnosis not present

## 2023-01-28 DIAGNOSIS — R1312 Dysphagia, oropharyngeal phase: Secondary | ICD-10-CM | POA: Diagnosis not present

## 2023-01-28 DIAGNOSIS — M6281 Muscle weakness (generalized): Secondary | ICD-10-CM | POA: Diagnosis not present

## 2023-01-28 DIAGNOSIS — R2681 Unsteadiness on feet: Secondary | ICD-10-CM | POA: Diagnosis not present

## 2023-01-28 DIAGNOSIS — R278 Other lack of coordination: Secondary | ICD-10-CM | POA: Diagnosis not present

## 2023-01-29 DIAGNOSIS — M6281 Muscle weakness (generalized): Secondary | ICD-10-CM | POA: Diagnosis not present

## 2023-01-29 DIAGNOSIS — R2681 Unsteadiness on feet: Secondary | ICD-10-CM | POA: Diagnosis not present

## 2023-01-29 DIAGNOSIS — R278 Other lack of coordination: Secondary | ICD-10-CM | POA: Diagnosis not present

## 2023-01-29 DIAGNOSIS — J8410 Pulmonary fibrosis, unspecified: Secondary | ICD-10-CM | POA: Diagnosis not present

## 2023-01-29 DIAGNOSIS — R1312 Dysphagia, oropharyngeal phase: Secondary | ICD-10-CM | POA: Diagnosis not present

## 2023-01-29 DIAGNOSIS — J9811 Atelectasis: Secondary | ICD-10-CM | POA: Diagnosis not present

## 2023-02-02 DIAGNOSIS — R278 Other lack of coordination: Secondary | ICD-10-CM | POA: Diagnosis not present

## 2023-02-02 DIAGNOSIS — M6281 Muscle weakness (generalized): Secondary | ICD-10-CM | POA: Diagnosis not present

## 2023-02-02 DIAGNOSIS — R2681 Unsteadiness on feet: Secondary | ICD-10-CM | POA: Diagnosis not present

## 2023-02-02 DIAGNOSIS — R1312 Dysphagia, oropharyngeal phase: Secondary | ICD-10-CM | POA: Diagnosis not present

## 2023-02-03 DIAGNOSIS — M6281 Muscle weakness (generalized): Secondary | ICD-10-CM | POA: Diagnosis not present

## 2023-02-03 DIAGNOSIS — R2681 Unsteadiness on feet: Secondary | ICD-10-CM | POA: Diagnosis not present

## 2023-02-03 DIAGNOSIS — R1312 Dysphagia, oropharyngeal phase: Secondary | ICD-10-CM | POA: Diagnosis not present

## 2023-02-03 DIAGNOSIS — R278 Other lack of coordination: Secondary | ICD-10-CM | POA: Diagnosis not present

## 2023-02-04 DIAGNOSIS — M6281 Muscle weakness (generalized): Secondary | ICD-10-CM | POA: Diagnosis not present

## 2023-02-04 DIAGNOSIS — R2681 Unsteadiness on feet: Secondary | ICD-10-CM | POA: Diagnosis not present

## 2023-02-04 DIAGNOSIS — R1312 Dysphagia, oropharyngeal phase: Secondary | ICD-10-CM | POA: Diagnosis not present

## 2023-02-04 DIAGNOSIS — R278 Other lack of coordination: Secondary | ICD-10-CM | POA: Diagnosis not present

## 2023-02-05 DIAGNOSIS — R2681 Unsteadiness on feet: Secondary | ICD-10-CM | POA: Diagnosis not present

## 2023-02-05 DIAGNOSIS — R278 Other lack of coordination: Secondary | ICD-10-CM | POA: Diagnosis not present

## 2023-02-05 DIAGNOSIS — M6281 Muscle weakness (generalized): Secondary | ICD-10-CM | POA: Diagnosis not present

## 2023-02-05 DIAGNOSIS — R1312 Dysphagia, oropharyngeal phase: Secondary | ICD-10-CM | POA: Diagnosis not present

## 2023-02-06 DIAGNOSIS — R1312 Dysphagia, oropharyngeal phase: Secondary | ICD-10-CM | POA: Diagnosis not present

## 2023-02-06 DIAGNOSIS — R2681 Unsteadiness on feet: Secondary | ICD-10-CM | POA: Diagnosis not present

## 2023-02-06 DIAGNOSIS — M6281 Muscle weakness (generalized): Secondary | ICD-10-CM | POA: Diagnosis not present

## 2023-02-06 DIAGNOSIS — R278 Other lack of coordination: Secondary | ICD-10-CM | POA: Diagnosis not present

## 2023-02-09 DIAGNOSIS — R2681 Unsteadiness on feet: Secondary | ICD-10-CM | POA: Diagnosis not present

## 2023-02-09 DIAGNOSIS — R1312 Dysphagia, oropharyngeal phase: Secondary | ICD-10-CM | POA: Diagnosis not present

## 2023-02-09 DIAGNOSIS — M6281 Muscle weakness (generalized): Secondary | ICD-10-CM | POA: Diagnosis not present

## 2023-02-09 DIAGNOSIS — R278 Other lack of coordination: Secondary | ICD-10-CM | POA: Diagnosis not present

## 2023-02-11 DIAGNOSIS — R278 Other lack of coordination: Secondary | ICD-10-CM | POA: Diagnosis not present

## 2023-02-11 DIAGNOSIS — R2681 Unsteadiness on feet: Secondary | ICD-10-CM | POA: Diagnosis not present

## 2023-02-11 DIAGNOSIS — R1312 Dysphagia, oropharyngeal phase: Secondary | ICD-10-CM | POA: Diagnosis not present

## 2023-02-11 DIAGNOSIS — M6281 Muscle weakness (generalized): Secondary | ICD-10-CM | POA: Diagnosis not present

## 2023-02-12 DIAGNOSIS — R278 Other lack of coordination: Secondary | ICD-10-CM | POA: Diagnosis not present

## 2023-02-12 DIAGNOSIS — M6281 Muscle weakness (generalized): Secondary | ICD-10-CM | POA: Diagnosis not present

## 2023-02-12 DIAGNOSIS — R1312 Dysphagia, oropharyngeal phase: Secondary | ICD-10-CM | POA: Diagnosis not present

## 2023-02-12 DIAGNOSIS — R2681 Unsteadiness on feet: Secondary | ICD-10-CM | POA: Diagnosis not present

## 2023-02-13 DIAGNOSIS — R1312 Dysphagia, oropharyngeal phase: Secondary | ICD-10-CM | POA: Diagnosis not present

## 2023-02-13 DIAGNOSIS — R278 Other lack of coordination: Secondary | ICD-10-CM | POA: Diagnosis not present

## 2023-02-13 DIAGNOSIS — M6281 Muscle weakness (generalized): Secondary | ICD-10-CM | POA: Diagnosis not present

## 2023-02-13 DIAGNOSIS — R2681 Unsteadiness on feet: Secondary | ICD-10-CM | POA: Diagnosis not present

## 2023-02-16 DIAGNOSIS — M6281 Muscle weakness (generalized): Secondary | ICD-10-CM | POA: Diagnosis not present

## 2023-02-16 DIAGNOSIS — R278 Other lack of coordination: Secondary | ICD-10-CM | POA: Diagnosis not present

## 2023-02-16 DIAGNOSIS — R2681 Unsteadiness on feet: Secondary | ICD-10-CM | POA: Diagnosis not present

## 2023-02-17 DIAGNOSIS — R000 Tachycardia, unspecified: Secondary | ICD-10-CM | POA: Diagnosis not present

## 2023-02-17 DIAGNOSIS — R5383 Other fatigue: Secondary | ICD-10-CM | POA: Diagnosis not present

## 2023-02-18 DIAGNOSIS — R2681 Unsteadiness on feet: Secondary | ICD-10-CM | POA: Diagnosis not present

## 2023-02-18 DIAGNOSIS — R278 Other lack of coordination: Secondary | ICD-10-CM | POA: Diagnosis not present

## 2023-02-18 DIAGNOSIS — M6281 Muscle weakness (generalized): Secondary | ICD-10-CM | POA: Diagnosis not present

## 2023-02-23 DIAGNOSIS — R2681 Unsteadiness on feet: Secondary | ICD-10-CM | POA: Diagnosis not present

## 2023-02-23 DIAGNOSIS — M6281 Muscle weakness (generalized): Secondary | ICD-10-CM | POA: Diagnosis not present

## 2023-02-23 DIAGNOSIS — R278 Other lack of coordination: Secondary | ICD-10-CM | POA: Diagnosis not present

## 2023-02-27 DIAGNOSIS — M6281 Muscle weakness (generalized): Secondary | ICD-10-CM | POA: Diagnosis not present

## 2023-02-27 DIAGNOSIS — R278 Other lack of coordination: Secondary | ICD-10-CM | POA: Diagnosis not present

## 2023-02-27 DIAGNOSIS — R2681 Unsteadiness on feet: Secondary | ICD-10-CM | POA: Diagnosis not present

## 2023-03-02 DIAGNOSIS — R2681 Unsteadiness on feet: Secondary | ICD-10-CM | POA: Diagnosis not present

## 2023-03-02 DIAGNOSIS — R278 Other lack of coordination: Secondary | ICD-10-CM | POA: Diagnosis not present

## 2023-03-02 DIAGNOSIS — M6281 Muscle weakness (generalized): Secondary | ICD-10-CM | POA: Diagnosis not present

## 2023-03-04 DIAGNOSIS — R2681 Unsteadiness on feet: Secondary | ICD-10-CM | POA: Diagnosis not present

## 2023-03-04 DIAGNOSIS — R278 Other lack of coordination: Secondary | ICD-10-CM | POA: Diagnosis not present

## 2023-03-04 DIAGNOSIS — M6281 Muscle weakness (generalized): Secondary | ICD-10-CM | POA: Diagnosis not present

## 2023-03-06 DIAGNOSIS — M6281 Muscle weakness (generalized): Secondary | ICD-10-CM | POA: Diagnosis not present

## 2023-03-06 DIAGNOSIS — R2681 Unsteadiness on feet: Secondary | ICD-10-CM | POA: Diagnosis not present

## 2023-03-06 DIAGNOSIS — R278 Other lack of coordination: Secondary | ICD-10-CM | POA: Diagnosis not present

## 2023-03-09 DIAGNOSIS — R278 Other lack of coordination: Secondary | ICD-10-CM | POA: Diagnosis not present

## 2023-03-09 DIAGNOSIS — M6281 Muscle weakness (generalized): Secondary | ICD-10-CM | POA: Diagnosis not present

## 2023-03-09 DIAGNOSIS — R2681 Unsteadiness on feet: Secondary | ICD-10-CM | POA: Diagnosis not present

## 2023-03-10 DIAGNOSIS — R2681 Unsteadiness on feet: Secondary | ICD-10-CM | POA: Diagnosis not present

## 2023-03-10 DIAGNOSIS — M6281 Muscle weakness (generalized): Secondary | ICD-10-CM | POA: Diagnosis not present

## 2023-03-10 DIAGNOSIS — R278 Other lack of coordination: Secondary | ICD-10-CM | POA: Diagnosis not present

## 2023-03-11 DIAGNOSIS — R278 Other lack of coordination: Secondary | ICD-10-CM | POA: Diagnosis not present

## 2023-03-11 DIAGNOSIS — M6281 Muscle weakness (generalized): Secondary | ICD-10-CM | POA: Diagnosis not present

## 2023-03-11 DIAGNOSIS — R2681 Unsteadiness on feet: Secondary | ICD-10-CM | POA: Diagnosis not present

## 2023-03-13 DIAGNOSIS — M6281 Muscle weakness (generalized): Secondary | ICD-10-CM | POA: Diagnosis not present

## 2023-03-13 DIAGNOSIS — R2681 Unsteadiness on feet: Secondary | ICD-10-CM | POA: Diagnosis not present

## 2023-03-13 DIAGNOSIS — R278 Other lack of coordination: Secondary | ICD-10-CM | POA: Diagnosis not present

## 2023-03-16 DIAGNOSIS — M6281 Muscle weakness (generalized): Secondary | ICD-10-CM | POA: Diagnosis not present

## 2023-03-16 DIAGNOSIS — R278 Other lack of coordination: Secondary | ICD-10-CM | POA: Diagnosis not present

## 2023-03-17 DIAGNOSIS — R278 Other lack of coordination: Secondary | ICD-10-CM | POA: Diagnosis not present

## 2023-03-17 DIAGNOSIS — M6281 Muscle weakness (generalized): Secondary | ICD-10-CM | POA: Diagnosis not present

## 2023-03-19 DIAGNOSIS — R278 Other lack of coordination: Secondary | ICD-10-CM | POA: Diagnosis not present

## 2023-03-19 DIAGNOSIS — M6281 Muscle weakness (generalized): Secondary | ICD-10-CM | POA: Diagnosis not present

## 2023-03-20 DIAGNOSIS — R278 Other lack of coordination: Secondary | ICD-10-CM | POA: Diagnosis not present

## 2023-03-20 DIAGNOSIS — M6281 Muscle weakness (generalized): Secondary | ICD-10-CM | POA: Diagnosis not present

## 2023-03-24 DIAGNOSIS — M6281 Muscle weakness (generalized): Secondary | ICD-10-CM | POA: Diagnosis not present

## 2023-03-24 DIAGNOSIS — R278 Other lack of coordination: Secondary | ICD-10-CM | POA: Diagnosis not present

## 2023-03-25 DIAGNOSIS — R278 Other lack of coordination: Secondary | ICD-10-CM | POA: Diagnosis not present

## 2023-03-25 DIAGNOSIS — M6281 Muscle weakness (generalized): Secondary | ICD-10-CM | POA: Diagnosis not present

## 2023-03-27 DIAGNOSIS — R278 Other lack of coordination: Secondary | ICD-10-CM | POA: Diagnosis not present

## 2023-03-27 DIAGNOSIS — M6281 Muscle weakness (generalized): Secondary | ICD-10-CM | POA: Diagnosis not present

## 2023-03-31 DIAGNOSIS — R278 Other lack of coordination: Secondary | ICD-10-CM | POA: Diagnosis not present

## 2023-03-31 DIAGNOSIS — M6281 Muscle weakness (generalized): Secondary | ICD-10-CM | POA: Diagnosis not present

## 2023-04-01 DIAGNOSIS — R278 Other lack of coordination: Secondary | ICD-10-CM | POA: Diagnosis not present

## 2023-04-01 DIAGNOSIS — M6281 Muscle weakness (generalized): Secondary | ICD-10-CM | POA: Diagnosis not present

## 2023-04-03 DIAGNOSIS — M6281 Muscle weakness (generalized): Secondary | ICD-10-CM | POA: Diagnosis not present

## 2023-04-03 DIAGNOSIS — R278 Other lack of coordination: Secondary | ICD-10-CM | POA: Diagnosis not present

## 2023-04-07 DIAGNOSIS — R278 Other lack of coordination: Secondary | ICD-10-CM | POA: Diagnosis not present

## 2023-04-07 DIAGNOSIS — M6281 Muscle weakness (generalized): Secondary | ICD-10-CM | POA: Diagnosis not present

## 2023-04-08 DIAGNOSIS — M6281 Muscle weakness (generalized): Secondary | ICD-10-CM | POA: Diagnosis not present

## 2023-04-08 DIAGNOSIS — R278 Other lack of coordination: Secondary | ICD-10-CM | POA: Diagnosis not present

## 2023-04-08 DIAGNOSIS — I10 Essential (primary) hypertension: Secondary | ICD-10-CM | POA: Diagnosis not present

## 2023-04-13 DIAGNOSIS — R278 Other lack of coordination: Secondary | ICD-10-CM | POA: Diagnosis not present

## 2023-04-13 DIAGNOSIS — M6281 Muscle weakness (generalized): Secondary | ICD-10-CM | POA: Diagnosis not present

## 2023-04-15 DIAGNOSIS — R278 Other lack of coordination: Secondary | ICD-10-CM | POA: Diagnosis not present

## 2023-04-15 DIAGNOSIS — M6281 Muscle weakness (generalized): Secondary | ICD-10-CM | POA: Diagnosis not present

## 2023-04-16 DIAGNOSIS — M6281 Muscle weakness (generalized): Secondary | ICD-10-CM | POA: Diagnosis not present

## 2023-04-16 DIAGNOSIS — R278 Other lack of coordination: Secondary | ICD-10-CM | POA: Diagnosis not present

## 2023-04-20 DIAGNOSIS — M6281 Muscle weakness (generalized): Secondary | ICD-10-CM | POA: Diagnosis not present

## 2023-04-20 DIAGNOSIS — R278 Other lack of coordination: Secondary | ICD-10-CM | POA: Diagnosis not present

## 2023-04-21 DIAGNOSIS — M6281 Muscle weakness (generalized): Secondary | ICD-10-CM | POA: Diagnosis not present

## 2023-04-21 DIAGNOSIS — R278 Other lack of coordination: Secondary | ICD-10-CM | POA: Diagnosis not present

## 2023-04-23 DIAGNOSIS — M6281 Muscle weakness (generalized): Secondary | ICD-10-CM | POA: Diagnosis not present

## 2023-04-23 DIAGNOSIS — R278 Other lack of coordination: Secondary | ICD-10-CM | POA: Diagnosis not present

## 2023-04-24 DIAGNOSIS — R278 Other lack of coordination: Secondary | ICD-10-CM | POA: Diagnosis not present

## 2023-04-24 DIAGNOSIS — M6281 Muscle weakness (generalized): Secondary | ICD-10-CM | POA: Diagnosis not present

## 2023-05-07 DIAGNOSIS — I10 Essential (primary) hypertension: Secondary | ICD-10-CM | POA: Diagnosis not present

## 2023-05-13 DIAGNOSIS — I10 Essential (primary) hypertension: Secondary | ICD-10-CM | POA: Diagnosis not present

## 2023-05-13 DIAGNOSIS — M19042 Primary osteoarthritis, left hand: Secondary | ICD-10-CM | POA: Diagnosis not present

## 2023-05-13 DIAGNOSIS — M1990 Unspecified osteoarthritis, unspecified site: Secondary | ICD-10-CM | POA: Diagnosis not present

## 2023-05-13 DIAGNOSIS — M19041 Primary osteoarthritis, right hand: Secondary | ICD-10-CM | POA: Diagnosis not present

## 2023-05-26 DIAGNOSIS — L820 Inflamed seborrheic keratosis: Secondary | ICD-10-CM | POA: Diagnosis not present

## 2023-05-26 DIAGNOSIS — L821 Other seborrheic keratosis: Secondary | ICD-10-CM | POA: Diagnosis not present

## 2023-05-26 DIAGNOSIS — L570 Actinic keratosis: Secondary | ICD-10-CM | POA: Diagnosis not present

## 2023-05-26 DIAGNOSIS — L814 Other melanin hyperpigmentation: Secondary | ICD-10-CM | POA: Diagnosis not present

## 2023-06-03 DIAGNOSIS — K219 Gastro-esophageal reflux disease without esophagitis: Secondary | ICD-10-CM | POA: Diagnosis not present

## 2023-06-03 DIAGNOSIS — J8410 Pulmonary fibrosis, unspecified: Secondary | ICD-10-CM | POA: Diagnosis not present

## 2023-06-03 DIAGNOSIS — E039 Hypothyroidism, unspecified: Secondary | ICD-10-CM | POA: Diagnosis not present

## 2023-06-03 DIAGNOSIS — R944 Abnormal results of kidney function studies: Secondary | ICD-10-CM | POA: Diagnosis not present

## 2023-06-03 DIAGNOSIS — F331 Major depressive disorder, recurrent, moderate: Secondary | ICD-10-CM | POA: Diagnosis not present

## 2023-06-03 DIAGNOSIS — I10 Essential (primary) hypertension: Secondary | ICD-10-CM | POA: Diagnosis not present

## 2023-06-03 DIAGNOSIS — Z6822 Body mass index (BMI) 22.0-22.9, adult: Secondary | ICD-10-CM | POA: Diagnosis not present

## 2023-06-03 DIAGNOSIS — Z0000 Encounter for general adult medical examination without abnormal findings: Secondary | ICD-10-CM | POA: Diagnosis not present

## 2023-06-03 DIAGNOSIS — M810 Age-related osteoporosis without current pathological fracture: Secondary | ICD-10-CM | POA: Diagnosis not present

## 2023-06-03 DIAGNOSIS — M2550 Pain in unspecified joint: Secondary | ICD-10-CM | POA: Diagnosis not present

## 2023-06-03 DIAGNOSIS — F4329 Adjustment disorder with other symptoms: Secondary | ICD-10-CM | POA: Diagnosis not present

## 2023-06-03 DIAGNOSIS — E7800 Pure hypercholesterolemia, unspecified: Secondary | ICD-10-CM | POA: Diagnosis not present

## 2023-06-06 DIAGNOSIS — I10 Essential (primary) hypertension: Secondary | ICD-10-CM | POA: Diagnosis not present

## 2023-06-13 DIAGNOSIS — M1990 Unspecified osteoarthritis, unspecified site: Secondary | ICD-10-CM | POA: Diagnosis not present

## 2023-06-13 DIAGNOSIS — M19042 Primary osteoarthritis, left hand: Secondary | ICD-10-CM | POA: Diagnosis not present

## 2023-06-13 DIAGNOSIS — M19041 Primary osteoarthritis, right hand: Secondary | ICD-10-CM | POA: Diagnosis not present

## 2023-06-13 DIAGNOSIS — I10 Essential (primary) hypertension: Secondary | ICD-10-CM | POA: Diagnosis not present

## 2023-06-23 DIAGNOSIS — L821 Other seborrheic keratosis: Secondary | ICD-10-CM | POA: Diagnosis not present

## 2023-06-23 DIAGNOSIS — L570 Actinic keratosis: Secondary | ICD-10-CM | POA: Diagnosis not present

## 2023-06-23 DIAGNOSIS — L820 Inflamed seborrheic keratosis: Secondary | ICD-10-CM | POA: Diagnosis not present

## 2023-06-23 DIAGNOSIS — L814 Other melanin hyperpigmentation: Secondary | ICD-10-CM | POA: Diagnosis not present

## 2023-07-06 DIAGNOSIS — I10 Essential (primary) hypertension: Secondary | ICD-10-CM | POA: Diagnosis not present

## 2023-07-13 DIAGNOSIS — M19042 Primary osteoarthritis, left hand: Secondary | ICD-10-CM | POA: Diagnosis not present

## 2023-07-13 DIAGNOSIS — M19041 Primary osteoarthritis, right hand: Secondary | ICD-10-CM | POA: Diagnosis not present

## 2023-07-13 DIAGNOSIS — I10 Essential (primary) hypertension: Secondary | ICD-10-CM | POA: Diagnosis not present

## 2023-07-13 DIAGNOSIS — M1990 Unspecified osteoarthritis, unspecified site: Secondary | ICD-10-CM | POA: Diagnosis not present

## 2023-08-05 DIAGNOSIS — I10 Essential (primary) hypertension: Secondary | ICD-10-CM | POA: Diagnosis not present

## 2023-08-13 DIAGNOSIS — M19041 Primary osteoarthritis, right hand: Secondary | ICD-10-CM | POA: Diagnosis not present

## 2023-08-13 DIAGNOSIS — M1990 Unspecified osteoarthritis, unspecified site: Secondary | ICD-10-CM | POA: Diagnosis not present

## 2023-08-13 DIAGNOSIS — M19042 Primary osteoarthritis, left hand: Secondary | ICD-10-CM | POA: Diagnosis not present

## 2023-08-13 DIAGNOSIS — I10 Essential (primary) hypertension: Secondary | ICD-10-CM | POA: Diagnosis not present

## 2023-09-04 DIAGNOSIS — F331 Major depressive disorder, recurrent, moderate: Secondary | ICD-10-CM | POA: Diagnosis not present

## 2023-09-04 DIAGNOSIS — I10 Essential (primary) hypertension: Secondary | ICD-10-CM | POA: Diagnosis not present

## 2023-09-04 DIAGNOSIS — Z6823 Body mass index (BMI) 23.0-23.9, adult: Secondary | ICD-10-CM | POA: Diagnosis not present

## 2023-09-04 DIAGNOSIS — F4329 Adjustment disorder with other symptoms: Secondary | ICD-10-CM | POA: Diagnosis not present

## 2023-09-04 DIAGNOSIS — M1990 Unspecified osteoarthritis, unspecified site: Secondary | ICD-10-CM | POA: Diagnosis not present

## 2023-09-12 IMAGING — CR DG KNEE COMPLETE 4+V*R*
4 series · 4 of 4 positions shown · non-contrast
Comparison: None

CLINICAL DATA: Fall

EXAM:
RIGHT KNEE - COMPLETE 4+ VIEW

[x knee ap right (1 of 3)]
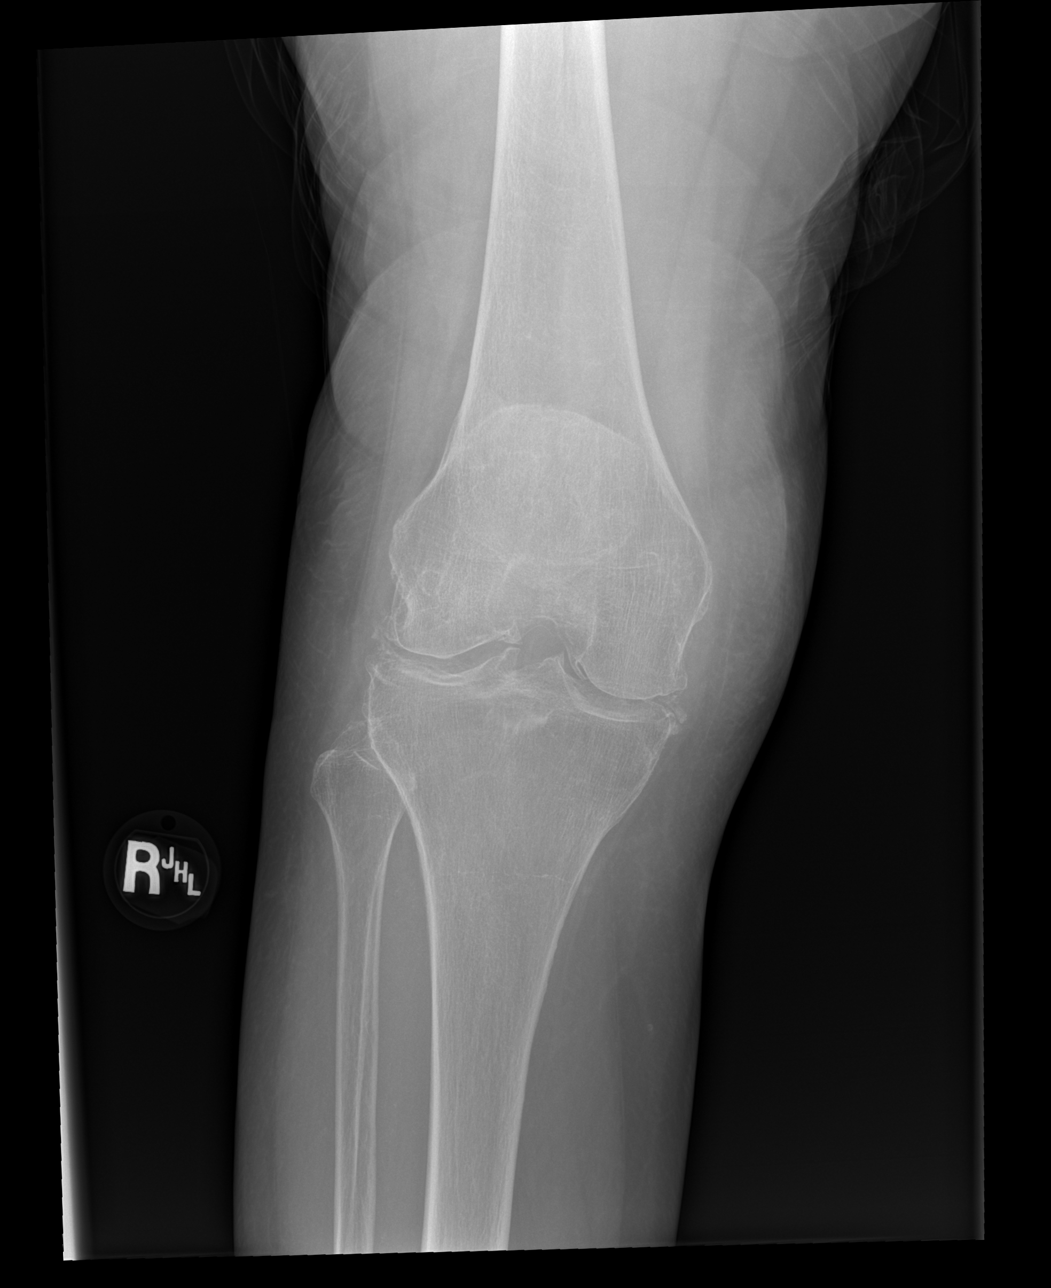

[x knee ap right (2 of 3)]
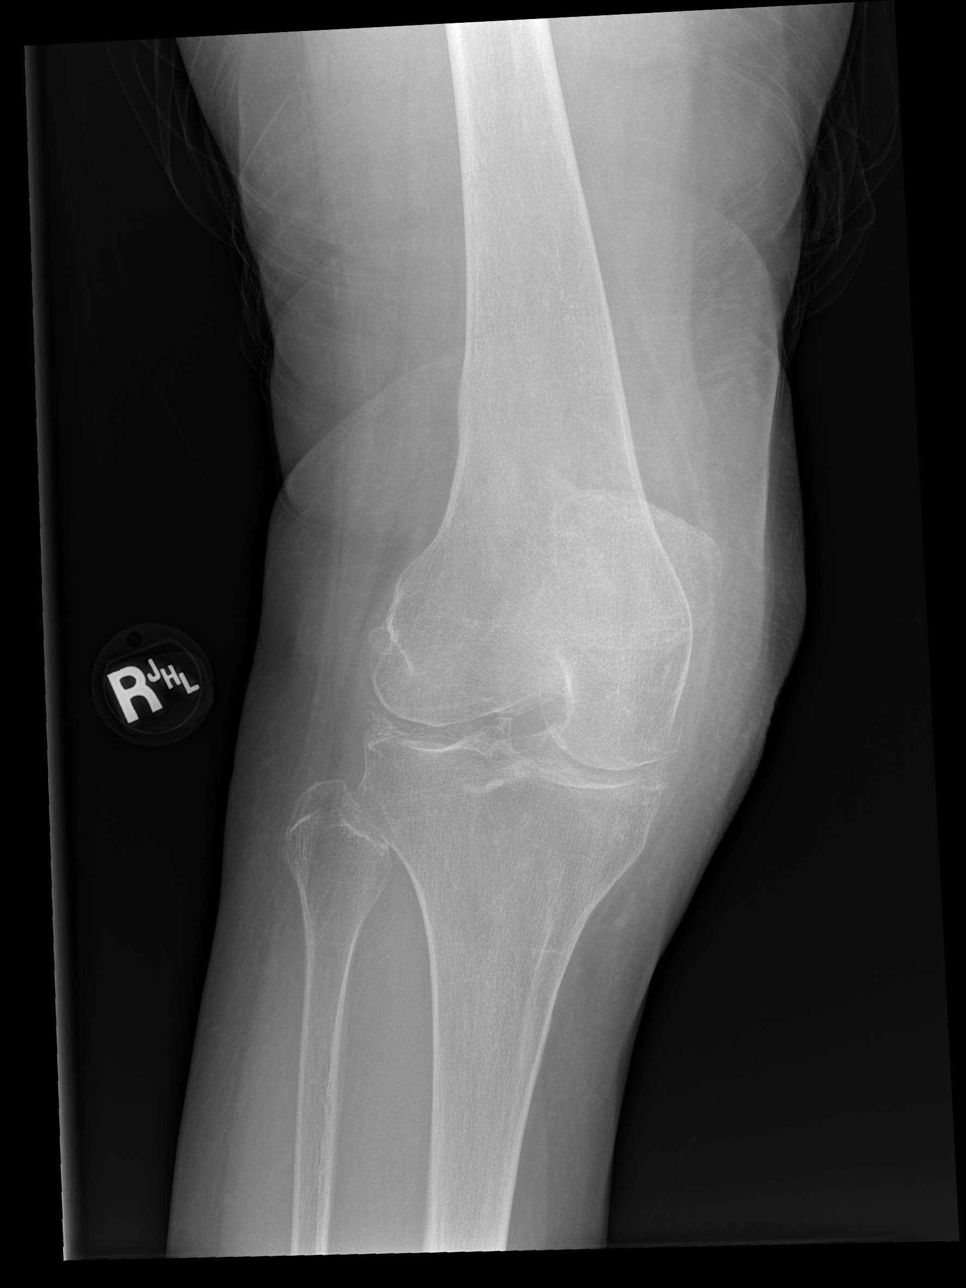

[x knee ap right (3 of 3)]
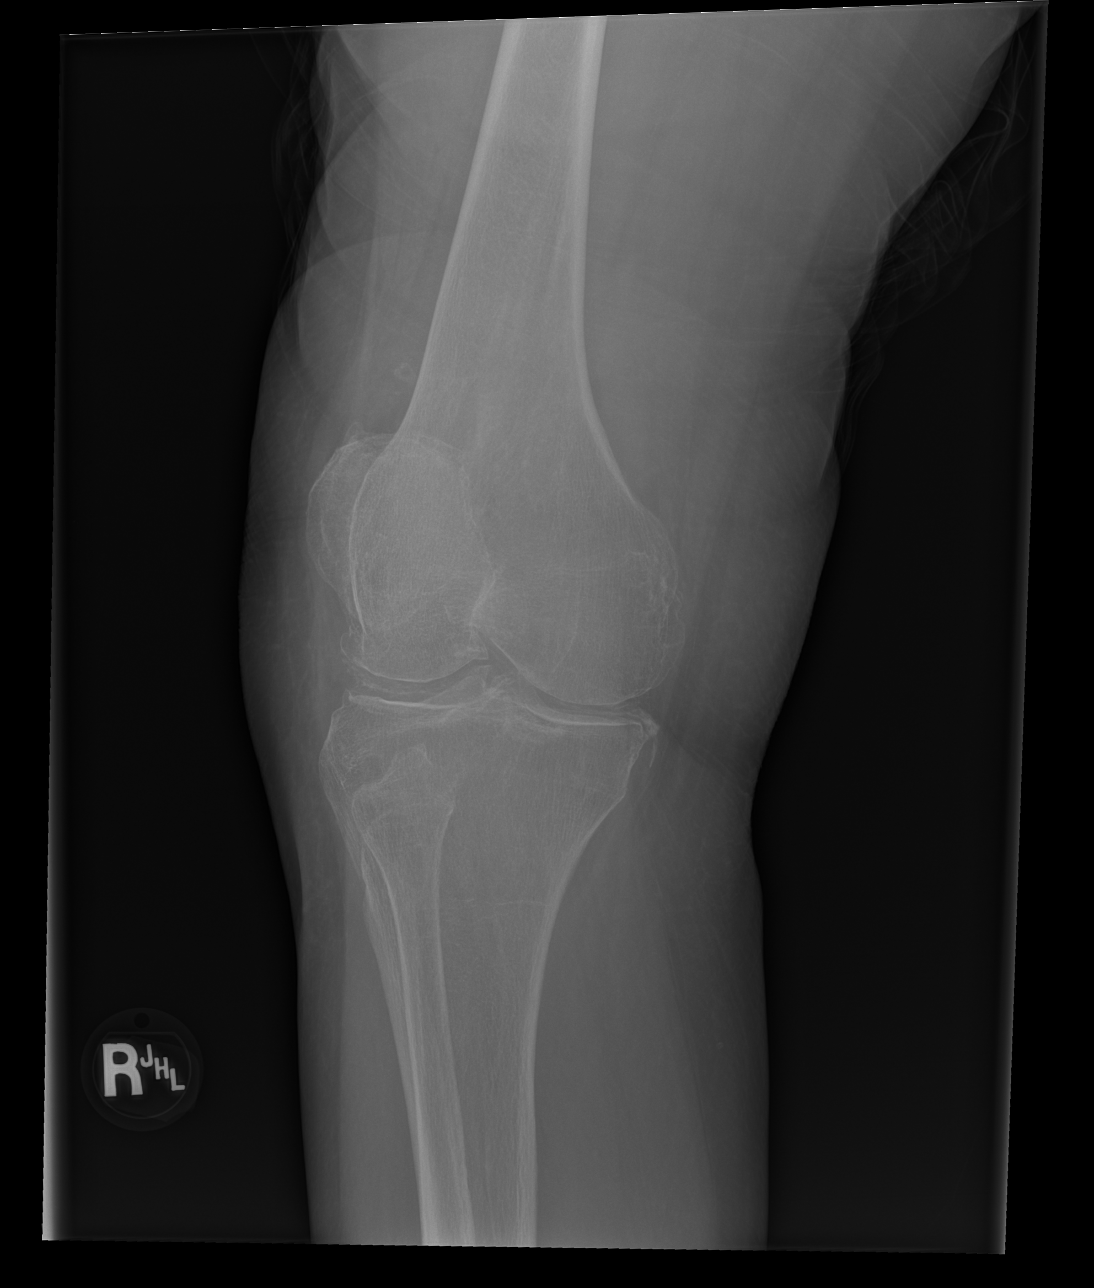

[x knee lat right]
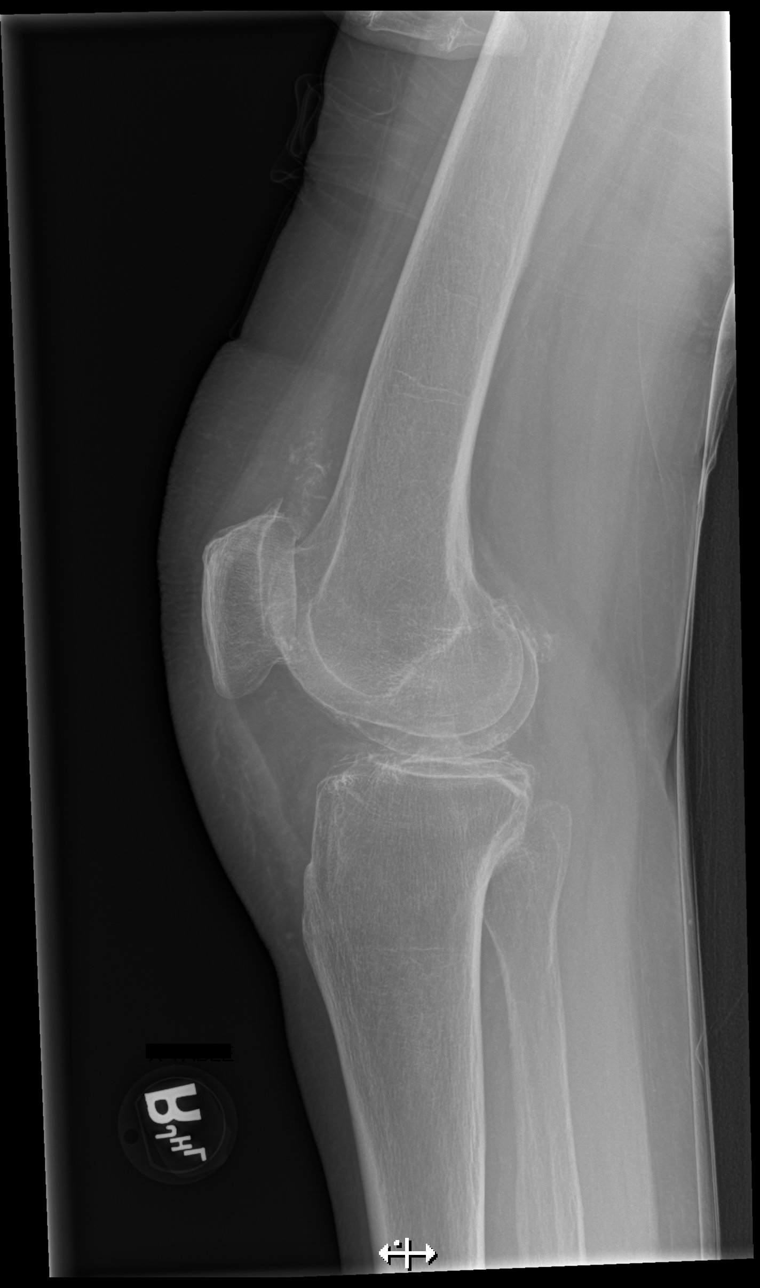

[4 of 4 positions shown; findings below may reference images not displayed]

FINDINGS: Marked osseous demineralization.

Scattered joint space narrowing and minimal spur formation.

Chondrocalcinosis question CPPD.

Vertebral body heights maintained.

No fracture, subluxation, or bone destruction.

Calcific debris within small joint effusion and suprapatellar
recess.

Anterior infrapatellar soft tissue swelling.
IMPRESSION: Osseous demineralization with degenerative changes and question CPPD
LEFT knee.

Calcific debris and minimal joint effusion.

No acute fracture or dislocation.

## 2023-09-12 IMAGING — CR DG TIBIA/FIBULA 2V*R*
2 series · 2 of 2 positions shown · non-contrast
Comparison: None

CLINICAL DATA: Leg pain post fall

EXAM:
RIGHT TIBIA AND FIBULA - 2 VIEW

[x tib-fib ap right]
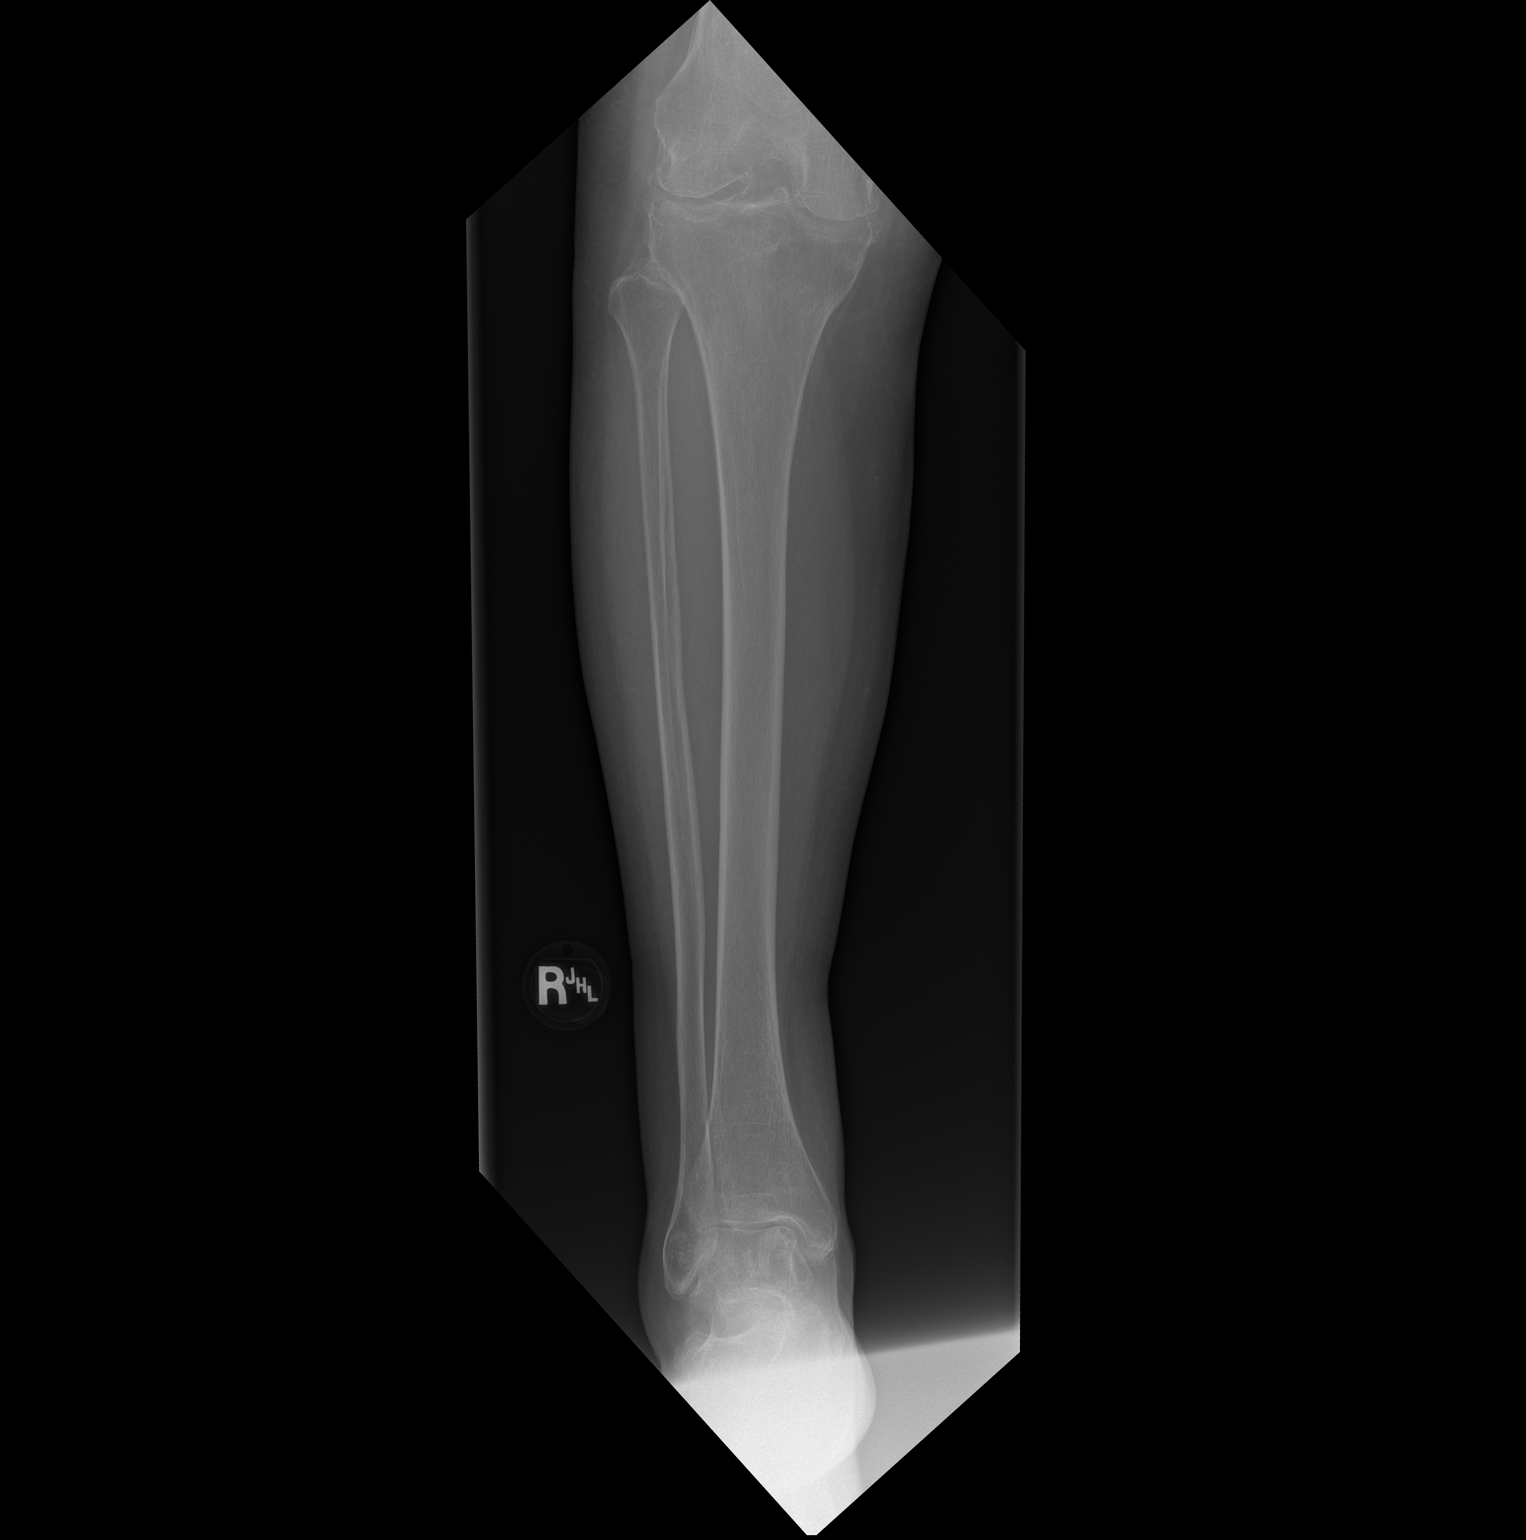

[x tib-fib lat right]
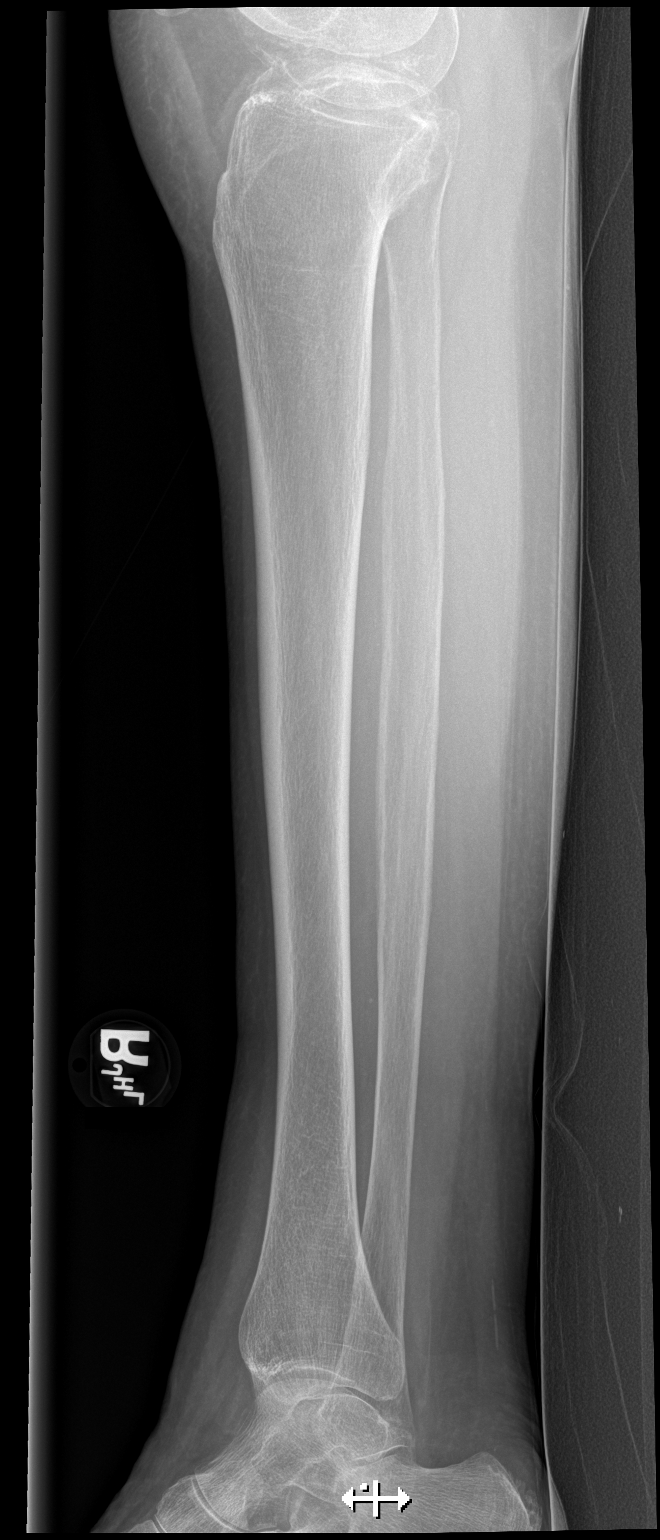

[2 of 2 positions shown; findings below may reference images not displayed]

FINDINGS: Osseous demineralization.

Degenerative changes at the RIGHT knee.

Soft tissue swelling at lateral LEFT ankle.

No acute fracture, dislocation, or bone destruction.
IMPRESSION: No acute osseous abnormalities.

## 2023-09-13 DIAGNOSIS — M1990 Unspecified osteoarthritis, unspecified site: Secondary | ICD-10-CM | POA: Diagnosis not present

## 2023-09-13 DIAGNOSIS — M19042 Primary osteoarthritis, left hand: Secondary | ICD-10-CM | POA: Diagnosis not present

## 2023-09-13 DIAGNOSIS — M19041 Primary osteoarthritis, right hand: Secondary | ICD-10-CM | POA: Diagnosis not present

## 2023-09-13 DIAGNOSIS — I10 Essential (primary) hypertension: Secondary | ICD-10-CM | POA: Diagnosis not present

## 2023-09-22 DIAGNOSIS — L578 Other skin changes due to chronic exposure to nonionizing radiation: Secondary | ICD-10-CM | POA: Diagnosis not present

## 2023-10-04 DIAGNOSIS — I10 Essential (primary) hypertension: Secondary | ICD-10-CM | POA: Diagnosis not present

## 2023-10-13 DIAGNOSIS — I10 Essential (primary) hypertension: Secondary | ICD-10-CM | POA: Diagnosis not present

## 2023-10-13 DIAGNOSIS — M1990 Unspecified osteoarthritis, unspecified site: Secondary | ICD-10-CM | POA: Diagnosis not present

## 2023-10-13 DIAGNOSIS — M19042 Primary osteoarthritis, left hand: Secondary | ICD-10-CM | POA: Diagnosis not present

## 2023-10-13 DIAGNOSIS — M19041 Primary osteoarthritis, right hand: Secondary | ICD-10-CM | POA: Diagnosis not present

## 2023-11-03 DIAGNOSIS — I10 Essential (primary) hypertension: Secondary | ICD-10-CM | POA: Diagnosis not present

## 2023-11-13 DIAGNOSIS — M19042 Primary osteoarthritis, left hand: Secondary | ICD-10-CM | POA: Diagnosis not present

## 2023-11-13 DIAGNOSIS — M1990 Unspecified osteoarthritis, unspecified site: Secondary | ICD-10-CM | POA: Diagnosis not present

## 2023-11-13 DIAGNOSIS — M19041 Primary osteoarthritis, right hand: Secondary | ICD-10-CM | POA: Diagnosis not present

## 2023-11-13 DIAGNOSIS — I10 Essential (primary) hypertension: Secondary | ICD-10-CM | POA: Diagnosis not present

## 2023-12-02 ENCOUNTER — Ambulatory Visit: Admitting: Cardiology

## 2023-12-03 DIAGNOSIS — I10 Essential (primary) hypertension: Secondary | ICD-10-CM | POA: Diagnosis not present

## 2023-12-13 DIAGNOSIS — M19042 Primary osteoarthritis, left hand: Secondary | ICD-10-CM | POA: Diagnosis not present

## 2023-12-13 DIAGNOSIS — M1990 Unspecified osteoarthritis, unspecified site: Secondary | ICD-10-CM | POA: Diagnosis not present

## 2023-12-13 DIAGNOSIS — M19041 Primary osteoarthritis, right hand: Secondary | ICD-10-CM | POA: Diagnosis not present

## 2023-12-13 DIAGNOSIS — I10 Essential (primary) hypertension: Secondary | ICD-10-CM | POA: Diagnosis not present

## 2024-01-13 ENCOUNTER — Ambulatory Visit: Admitting: Cardiology
